# Patient Record
Sex: Female | Born: 1940 | Race: White | Hispanic: No | Marital: Single | Smoking: Never smoker
Health system: Southern US, Community
[De-identification: ages and names within clinical notes are randomized; demographics above are authoritative.]

## PROBLEM LIST (undated history)

## (undated) DIAGNOSIS — F419 Anxiety disorder, unspecified: Secondary | ICD-10-CM

## (undated) DIAGNOSIS — R600 Localized edema: Secondary | ICD-10-CM

## (undated) DIAGNOSIS — M1990 Unspecified osteoarthritis, unspecified site: Secondary | ICD-10-CM

## (undated) DIAGNOSIS — M2550 Pain in unspecified joint: Secondary | ICD-10-CM

## (undated) DIAGNOSIS — Z8709 Personal history of other diseases of the respiratory system: Secondary | ICD-10-CM

## (undated) DIAGNOSIS — R197 Diarrhea, unspecified: Secondary | ICD-10-CM

## (undated) DIAGNOSIS — R609 Edema, unspecified: Secondary | ICD-10-CM

## (undated) DIAGNOSIS — Z8619 Personal history of other infectious and parasitic diseases: Secondary | ICD-10-CM

## (undated) DIAGNOSIS — Z860100 Personal history of colon polyps, unspecified: Secondary | ICD-10-CM

## (undated) DIAGNOSIS — K5790 Diverticulosis of intestine, part unspecified, without perforation or abscess without bleeding: Secondary | ICD-10-CM

## (undated) DIAGNOSIS — Z9289 Personal history of other medical treatment: Secondary | ICD-10-CM

## (undated) DIAGNOSIS — M549 Dorsalgia, unspecified: Secondary | ICD-10-CM

## (undated) DIAGNOSIS — K648 Other hemorrhoids: Secondary | ICD-10-CM

## (undated) DIAGNOSIS — M2540 Effusion, unspecified joint: Secondary | ICD-10-CM

## (undated) DIAGNOSIS — I10 Essential (primary) hypertension: Secondary | ICD-10-CM

## (undated) DIAGNOSIS — D649 Anemia, unspecified: Secondary | ICD-10-CM

## (undated) DIAGNOSIS — R0602 Shortness of breath: Secondary | ICD-10-CM

## (undated) DIAGNOSIS — K219 Gastro-esophageal reflux disease without esophagitis: Secondary | ICD-10-CM

## (undated) DIAGNOSIS — R200 Anesthesia of skin: Secondary | ICD-10-CM

## (undated) DIAGNOSIS — R51 Headache: Secondary | ICD-10-CM

## (undated) DIAGNOSIS — Z9889 Other specified postprocedural states: Secondary | ICD-10-CM

## (undated) DIAGNOSIS — R3915 Urgency of urination: Secondary | ICD-10-CM

## (undated) DIAGNOSIS — R350 Frequency of micturition: Secondary | ICD-10-CM

## (undated) DIAGNOSIS — K449 Diaphragmatic hernia without obstruction or gangrene: Secondary | ICD-10-CM

## (undated) DIAGNOSIS — I872 Venous insufficiency (chronic) (peripheral): Secondary | ICD-10-CM

## (undated) DIAGNOSIS — H269 Unspecified cataract: Secondary | ICD-10-CM

## (undated) DIAGNOSIS — G4730 Sleep apnea, unspecified: Secondary | ICD-10-CM

## (undated) DIAGNOSIS — L539 Erythematous condition, unspecified: Secondary | ICD-10-CM

## (undated) DIAGNOSIS — R112 Nausea with vomiting, unspecified: Secondary | ICD-10-CM

## (undated) DIAGNOSIS — R911 Solitary pulmonary nodule: Secondary | ICD-10-CM

## (undated) DIAGNOSIS — R351 Nocturia: Secondary | ICD-10-CM

## (undated) HISTORY — DX: Diverticulosis of intestine, part unspecified, without perforation or abscess without bleeding: K57.90

## (undated) HISTORY — PX: ESOPHAGOGASTRODUODENOSCOPY: SHX1529

## (undated) HISTORY — DX: Venous insufficiency (chronic) (peripheral): I87.2

## (undated) HISTORY — PX: COLONOSCOPY: SHX174

## (undated) HISTORY — DX: Other hemorrhoids: K64.8

## (undated) HISTORY — DX: Anxiety disorder, unspecified: F41.9

## (undated) HISTORY — DX: Anemia, unspecified: D64.9

## (undated) HISTORY — DX: Solitary pulmonary nodule: R91.1

## (undated) HISTORY — DX: Unspecified osteoarthritis, unspecified site: M19.90

## (undated) HISTORY — PX: BREAST SURGERY: SHX581

## (undated) HISTORY — DX: Diaphragmatic hernia without obstruction or gangrene: K44.9

## (undated) SURGERY — Surgical Case
Anesthesia: *Unknown

---

## 1972-09-26 HISTORY — PX: TUBAL LIGATION: SHX77

## 1981-09-26 HISTORY — PX: ABDOMINAL HYSTERECTOMY: SHX81

## 1983-09-27 HISTORY — PX: OTHER SURGICAL HISTORY: SHX169

## 1988-09-26 HISTORY — PX: CHOLECYSTECTOMY: SHX55

## 1991-09-27 HISTORY — PX: ANKLE SURGERY: SHX546

## 2001-11-27 DIAGNOSIS — I739 Peripheral vascular disease, unspecified: Secondary | ICD-10-CM | POA: Insufficient documentation

## 2002-01-21 DIAGNOSIS — M1990 Unspecified osteoarthritis, unspecified site: Secondary | ICD-10-CM | POA: Insufficient documentation

## 2002-07-11 DIAGNOSIS — I872 Venous insufficiency (chronic) (peripheral): Secondary | ICD-10-CM | POA: Insufficient documentation

## 2008-10-23 DIAGNOSIS — F419 Anxiety disorder, unspecified: Secondary | ICD-10-CM | POA: Insufficient documentation

## 2009-02-24 DIAGNOSIS — G4730 Sleep apnea, unspecified: Secondary | ICD-10-CM | POA: Insufficient documentation

## 2009-09-26 HISTORY — PX: HAND SURGERY: SHX662

## 2011-06-27 HISTORY — PX: OTHER SURGICAL HISTORY: SHX169

## 2011-09-27 HISTORY — PX: ANTERIOR CRUCIATE LIGAMENT REPAIR: SHX115

## 2012-01-31 NOTE — Consult Note (Addendum)
Anesthesia Phone Consult:  Patient is a 71 year old female scheduled for a left TKA by Dr. Sherlean Foot on 02/13/12.  Her PAT appointment is scheduled for 02/07/12.  I and Anesthesiologist Dr. Noreene Larsson spoke with Ms. Oberhaus over the telephone in April 2013 (per Dr. Tobin Chad request prior to officially scheduling patient's surgery).    Her PCP is Dr. Margarito Liner. Criss Alvine, III at [facility] in [place].  Her history includes HTN, OSA without use of CPAP, obesity, anxiety, venous insufficiency, OA, hiatal hernia, cholecystectomy, hysterectomy.    Her EKG from 11/28/11 [facility]) showed SR possible septal infarct (age undetermined), nonspecific ST changes (possibly due to artifact in inferior lateral leads).  When I spoke with her she denied CP.  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  Her PCP has recently added Norvasc and changed her from HCTZ to furosemide.  These were in addition to her metoprolol and losartan.  She faxed me a list of several BP readings done in March and April 2013 that ranged from 147-178/70-78.  Dr. Noreene Larsson recommended she have a stress test preoperatively which was arranged by her PCP.  On 01/11/12 she had a nuclear stress test at [facility] in [place], [place] that showed:  Negative Lexiscan stress Cardiolite study with no evidence of inducible ischemia or myocaridal scar and normal LV size and systolic function, EF 71%.   She will need a CXR and labs at her PAT appointment.  I can follow-up with her then as well.  Addendum: 02/07/12 1630  I saw patient today during her PAT visit.  She has no new issues.  She has already been seen at Dr. Tobin Chad office for her H&P.  BP 132/80 today.  Labs reviewed.  Urine culture is still pending.  CXR today showed no active disease and degenerative changes of the mid thoracic spine.   Exam shows heart RRR, no murmur, lungs clear, No carotid bruits noted.  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 She has not been able to tolerate Lasix on a daily basis due to her work schedule, but typically takes HCTZ or Lasix on a daily basis.     Questions answered.  She will talk further with her assigned Anesthesiologist on the day of surgery.  Plan to proceed.   Addendum: 02/09/12 1645  Urine culture results called to Altamese Cabal, PA-C.  He has requested that a foley catheter be placed in Short Stay on arrival for surgery and a UA, C&S be resent at that time.  Shonna Chock, PA-C

## 2012-02-01 ENCOUNTER — Encounter (HOSPITAL_COMMUNITY): Payer: Self-pay | Admitting: Pharmacy Technician

## 2012-02-02 ENCOUNTER — Other Ambulatory Visit: Payer: Self-pay | Admitting: Orthopedic Surgery

## 2012-02-07 ENCOUNTER — Encounter (HOSPITAL_COMMUNITY): Payer: Self-pay

## 2012-02-07 ENCOUNTER — Encounter (HOSPITAL_COMMUNITY)
Admission: RE | Admit: 2012-02-07 | Discharge: 2012-02-07 | Disposition: A | Discharge status: Home / Self Care | Payer: BC Managed Care – PPO | Source: Ambulatory Visit | Attending: Orthopedic Surgery | Admitting: Orthopedic Surgery

## 2012-02-07 HISTORY — DX: Shortness of breath: R06.02

## 2012-02-07 HISTORY — DX: Other specified postprocedural states: R11.2

## 2012-02-07 HISTORY — DX: Sleep apnea, unspecified: G47.30

## 2012-02-07 HISTORY — DX: Gastro-esophageal reflux disease without esophagitis: K21.9

## 2012-02-07 HISTORY — DX: Essential (primary) hypertension: I10

## 2012-02-07 HISTORY — DX: Unspecified osteoarthritis, unspecified site: M19.90

## 2012-02-07 HISTORY — DX: Other specified postprocedural states: Z98.890

## 2012-02-07 LAB — COMPREHENSIVE METABOLIC PANEL
ALT: 15 U/L (ref 0–35)
AST: 13 U/L (ref 0–37)
Albumin: 3.8 g/dL (ref 3.5–5.2)
Alkaline Phosphatase: 69 U/L (ref 39–117)
BUN: 16 mg/dL (ref 6–23)
CO2: 30 mEq/L (ref 19–32)
Calcium: 10 mg/dL (ref 8.4–10.5)
Chloride: 100 mEq/L (ref 96–112)
Creatinine, Ser: 0.71 mg/dL (ref 0.50–1.10)
GFR calc Af Amer: >90 mL/min (ref >90)
GFR calc non Af Amer: 85 mL/min — ABNORMAL LOW (ref >90)
Glucose, Bld: 90 mg/dL (ref 70–99)
Potassium: 3.8 mEq/L (ref 3.5–5.1)
Sodium: 139 mEq/L (ref 135–145)
Total Bilirubin: 0.8 mg/dL (ref 0.3–1.2)
Total Protein: 7.2 g/dL (ref 6.0–8.3)

## 2012-02-07 LAB — DIFFERENTIAL
Basophils Absolute: 0 10*3/uL (ref 0.0–0.1)
Basophils Relative: 0 % (ref 0–1)
Eosinophils Absolute: 0.2 10*3/uL (ref 0.0–0.7)
Eosinophils Relative: 2 % (ref 0–5)
Lymphocytes Relative: 31 % (ref 12–46)
Lymphs Abs: 2.9 10*3/uL (ref 0.7–4.0)
Monocytes Absolute: 0.7 10*3/uL (ref 0.1–1.0)
Monocytes Relative: 7 % (ref 3–12)
Neutro Abs: 5.7 10*3/uL (ref 1.7–7.7)
Neutrophils Relative %: 60 % (ref 43–77)

## 2012-02-07 LAB — CBC
HCT: 39.6 % (ref 36.0–46.0)
Hemoglobin: 12.8 g/dL (ref 12.0–15.0)
MCH: 29.2 pg (ref 26.0–34.0)
MCHC: 32.3 g/dL (ref 30.0–36.0)
MCV: 90.4 fL (ref 78.0–100.0)
Platelets: 252 10*3/uL (ref 150–400)
RBC: 4.38 MIL/uL (ref 3.87–5.11)
RDW: 13.7 % (ref 11.5–15.5)
WBC: 9.5 10*3/uL (ref 4.0–10.5)

## 2012-02-07 LAB — TYPE AND SCREEN
ABO/RH(D): B POS
Antibody Screen: NEGATIVE

## 2012-02-07 LAB — URINALYSIS, ROUTINE W REFLEX MICROSCOPIC
Bilirubin Urine: NEGATIVE
Glucose, UA: NEGATIVE mg/dL
Hgb urine dipstick: NEGATIVE
Ketones, ur: NEGATIVE mg/dL
Nitrite: NEGATIVE
Protein, ur: NEGATIVE mg/dL
Specific Gravity, Urine: 1.012 (ref 1.005–1.030)
Urobilinogen, UA: 0.2 mg/dL (ref 0.0–1.0)
pH: 6.5 (ref 5.0–8.0)

## 2012-02-07 LAB — PROTIME-INR
INR: 0.92 (ref 0.00–1.49)
Prothrombin Time: 12.6 seconds (ref 11.6–15.2)

## 2012-02-07 LAB — SURGICAL PCR SCREEN
MRSA, PCR: NEGATIVE
Staphylococcus aureus: NEGATIVE

## 2012-02-07 LAB — URINE MICROSCOPIC-ADD ON

## 2012-02-07 LAB — ABO/RH: ABO/RH(D): B POS

## 2012-02-07 LAB — APTT: aPTT: 30 seconds (ref 24–37)

## 2012-02-07 NOTE — Progress Notes (Signed)
[name]  [place] , [place]  Tedra Coupe )

## 2012-02-07 NOTE — Pre-Procedure Instructions (Signed)
20 Kenadi Miltner  02/07/2012   Your procedure is scheduled on:  Monday Feb 13, 2012.  Report to [facility] at 0800 AM.  Call this number if you have problems the morning of surgery: [phone]   Remember:   Do not eat food:After Midnight.  May have clear liquids: up to 4 Hours before arrival until 0400 am.  Clear liquids include soda, tea, black coffee, apple or grape juice, broth.  Take these medicines the morning of surgery with A SIP OF WATER: Amlodipine (Norvasc), Metoprolol (Lopressor), and Pantoprazole (Protonix).   Do not wear jewelry, make-up or nail polish.  Do not wear lotions, powders, or perfumes. You may wear deodorant.  Do not shave 48 hours prior to surgery. Men may shave face and neck.  Do not bring valuables to the hospital.  Contacts, dentures or bridgework may not be worn into surgery.  Leave suitcase in the car. After surgery it may be brought to your room.  For patients admitted to the hospital, checkout time is 11:00 AM the day of discharge.   Patients discharged the day of surgery will not be allowed to drive home.  Name and phone number of your driver:   Special Instructions: CHG Shower Use Special Wash: 1/2 bottle night before surgery and 1/2 bottle morning of surgery.   Please read over the following fact sheets that you were given: Pain Booklet, Coughing and Deep Breathing, Blood Transfusion Information, Total Joint Packet, MRSA Information and Surgical Site Infection Prevention

## 2012-02-09 LAB — URINE CULTURE
Colony Count: >=100000
Culture  Setup Time: 201305150242

## 2012-02-12 MED ORDER — CEFAZOLIN SODIUM-DEXTROSE 2-3 GM-% IV SOLR
2.0000 g | INTRAVENOUS | Status: AC
  Administered 2012-02-13: 2 g via INTRAVENOUS
  Filled 2012-02-12: qty 50

## 2012-02-13 ENCOUNTER — Encounter (HOSPITAL_COMMUNITY): Payer: Self-pay | Admitting: Vascular Surgery

## 2012-02-13 ENCOUNTER — Inpatient Hospital Stay (HOSPITAL_COMMUNITY)
Admission: RE | Admit: 2012-02-13 | Discharge: 2012-02-16 | DRG: 209 | Disposition: A | Discharge status: Home Health | Payer: BC Managed Care – PPO | Source: Ambulatory Visit | Attending: Orthopedic Surgery | Admitting: Orthopedic Surgery

## 2012-02-13 ENCOUNTER — Ambulatory Visit (HOSPITAL_COMMUNITY): Payer: BC Managed Care – PPO | Admitting: Vascular Surgery

## 2012-02-13 ENCOUNTER — Encounter (HOSPITAL_COMMUNITY)
Admission: RE | Disposition: A | Discharge status: Home Health | Payer: Self-pay | Source: Ambulatory Visit | Attending: Orthopedic Surgery

## 2012-02-13 ENCOUNTER — Encounter (HOSPITAL_COMMUNITY): Payer: Self-pay | Admitting: *Deleted

## 2012-02-13 DIAGNOSIS — Z96659 Presence of unspecified artificial knee joint: Secondary | ICD-10-CM

## 2012-02-13 DIAGNOSIS — M129 Arthropathy, unspecified: Secondary | ICD-10-CM | POA: Diagnosis present

## 2012-02-13 DIAGNOSIS — Z79899 Other long term (current) drug therapy: Secondary | ICD-10-CM

## 2012-02-13 DIAGNOSIS — M1712 Unilateral primary osteoarthritis, left knee: Secondary | ICD-10-CM

## 2012-02-13 DIAGNOSIS — D62 Acute posthemorrhagic anemia: Secondary | ICD-10-CM | POA: Diagnosis not present

## 2012-02-13 DIAGNOSIS — I10 Essential (primary) hypertension: Secondary | ICD-10-CM | POA: Diagnosis present

## 2012-02-13 DIAGNOSIS — K219 Gastro-esophageal reflux disease without esophagitis: Secondary | ICD-10-CM | POA: Diagnosis present

## 2012-02-13 DIAGNOSIS — M1710 Unilateral primary osteoarthritis, unspecified knee: Principal | ICD-10-CM | POA: Diagnosis present

## 2012-02-13 DIAGNOSIS — G4730 Sleep apnea, unspecified: Secondary | ICD-10-CM | POA: Diagnosis present

## 2012-02-13 HISTORY — PX: TOTAL KNEE ARTHROPLASTY: SHX125

## 2012-02-13 LAB — URINALYSIS, ROUTINE W REFLEX MICROSCOPIC
Bilirubin Urine: NEGATIVE
Glucose, UA: NEGATIVE mg/dL
Hgb urine dipstick: NEGATIVE
Ketones, ur: NEGATIVE mg/dL
Leukocytes, UA: NEGATIVE
Nitrite: NEGATIVE
Protein, ur: NEGATIVE mg/dL
Specific Gravity, Urine: 1.024 (ref 1.005–1.030)
Urobilinogen, UA: 0.2 mg/dL (ref 0.0–1.0)
pH: 5.5 (ref 5.0–8.0)

## 2012-02-13 SURGERY — ARTHROPLASTY, KNEE, TOTAL
Anesthesia: Regional | Site: Knee | Laterality: Left | Wound class: Clean

## 2012-02-13 MED ORDER — ALUM & MAG HYDROXIDE-SIMETH 200-200-20 MG/5ML PO SUSP: 30.0000 mL | ORAL | Status: DC | PRN

## 2012-02-13 MED ORDER — PROPOFOL 10 MG/ML IV EMUL
INTRAVENOUS | Status: DC | PRN
  Administered 2012-02-13: 200 mg via INTRAVENOUS

## 2012-02-13 MED ORDER — MENTHOL 3 MG MT LOZG: 1.0000 | LOZENGE | OROMUCOSAL | Status: DC | PRN

## 2012-02-13 MED ORDER — LACTATED RINGERS IV SOLN
INTRAVENOUS | Status: DC | PRN
  Administered 2012-02-13 (×3): via INTRAVENOUS

## 2012-02-13 MED ORDER — MIDAZOLAM HCL 2 MG/2ML IJ SOLN: 0.5000 mg | Freq: Once | INTRAMUSCULAR | Status: DC | PRN

## 2012-02-13 MED ORDER — LIDOCAINE HCL (CARDIAC) 20 MG/ML IV SOLN
INTRAVENOUS | Status: DC | PRN
  Administered 2012-02-13: 50 mg via INTRAVENOUS

## 2012-02-13 MED ORDER — ACETAMINOPHEN 10 MG/ML IV SOLN
1000.0000 mg | Freq: Four times a day (QID) | INTRAVENOUS | Status: DC
  Administered 2012-02-13: 1000 mg via INTRAVENOUS

## 2012-02-13 MED ORDER — HYDROMORPHONE HCL PF 1 MG/ML IJ SOLN
0.5000 mg | INTRAMUSCULAR | Status: DC | PRN
  Administered 2012-02-13: 1 mg via INTRAVENOUS
  Filled 2012-02-13: qty 1

## 2012-02-13 MED ORDER — SENNA 8.6 MG PO TABS
1.0000 | ORAL_TABLET | Freq: Two times a day (BID) | ORAL | Status: DC
  Administered 2012-02-14 – 2012-02-16 (×5): 8.6 mg via ORAL
  Filled 2012-02-13 (×7): qty 1

## 2012-02-13 MED ORDER — DIAZEPAM 5 MG/ML IJ SOLN
5.0000 mg | Freq: Once | INTRAMUSCULAR | Status: AC
  Administered 2012-02-13: 5 mg via INTRAVENOUS

## 2012-02-13 MED ORDER — PANTOPRAZOLE SODIUM 40 MG PO TBEC
40.0000 mg | DELAYED_RELEASE_TABLET | Freq: Every day | ORAL | Status: DC
  Administered 2012-02-14 – 2012-02-16 (×3): 40 mg via ORAL
  Filled 2012-02-13 (×4): qty 1

## 2012-02-13 MED ORDER — LACTATED RINGERS IV SOLN
INTRAVENOUS | Status: DC
  Administered 2012-02-13: 10:00:00 via INTRAVENOUS

## 2012-02-13 MED ORDER — HYDROCHLOROTHIAZIDE 25 MG PO TABS
25.0000 mg | ORAL_TABLET | Freq: Every day | ORAL | Status: DC
  Administered 2012-02-13 – 2012-02-16 (×4): 25 mg via ORAL
  Filled 2012-02-13 (×4): qty 1

## 2012-02-13 MED ORDER — BUPIVACAINE 0.25 % ON-Q PUMP SINGLE CATH 300ML
300.0000 mL | INJECTION | Status: DC
  Filled 2012-02-13: qty 300

## 2012-02-13 MED ORDER — METHOCARBAMOL 100 MG/ML IJ SOLN
500.0000 mg | INTRAVENOUS | Status: AC
  Administered 2012-02-13: 500 mg via INTRAVENOUS
  Filled 2012-02-13: qty 5

## 2012-02-13 MED ORDER — SODIUM CHLORIDE 0.9 % IV SOLN
INTRAVENOUS | Status: DC
  Administered 2012-02-15: 04:00:00 via INTRAVENOUS

## 2012-02-13 MED ORDER — SODIUM CHLORIDE 0.9 % IR SOLN
Status: DC | PRN
  Administered 2012-02-13: 3000 mL

## 2012-02-13 MED ORDER — BISACODYL 5 MG PO TBEC: 5.0000 mg | DELAYED_RELEASE_TABLET | Freq: Every day | ORAL | Status: DC | PRN

## 2012-02-13 MED ORDER — TRIAMCINOLONE ACETONIDE 0.1 % EX CREA
1.0000 "application " | TOPICAL_CREAM | Freq: Two times a day (BID) | CUTANEOUS | Status: DC
  Administered 2012-02-14 – 2012-02-15 (×2): 1 via TOPICAL
  Filled 2012-02-13: qty 15

## 2012-02-13 MED ORDER — LOSARTAN POTASSIUM 50 MG PO TABS
100.0000 mg | ORAL_TABLET | Freq: Every day | ORAL | Status: DC
  Administered 2012-02-13 – 2012-02-16 (×4): 100 mg via ORAL
  Filled 2012-02-13 (×4): qty 2

## 2012-02-13 MED ORDER — FUROSEMIDE 40 MG PO TABS
40.0000 mg | ORAL_TABLET | Freq: Every day | ORAL | Status: DC | PRN
  Filled 2012-02-13: qty 1

## 2012-02-13 MED ORDER — OXYCODONE HCL 5 MG PO TABS
5.0000 mg | ORAL_TABLET | ORAL | Status: DC | PRN
  Administered 2012-02-13 – 2012-02-16 (×8): 10 mg via ORAL
  Filled 2012-02-13 (×8): qty 2

## 2012-02-13 MED ORDER — FENTANYL CITRATE 0.05 MG/ML IJ SOLN
INTRAMUSCULAR | Status: AC
  Filled 2012-02-13: qty 2

## 2012-02-13 MED ORDER — AMLODIPINE BESYLATE 5 MG PO TABS
5.0000 mg | ORAL_TABLET | Freq: Every day | ORAL | Status: DC
  Administered 2012-02-14 – 2012-02-16 (×3): 5 mg via ORAL
  Filled 2012-02-13 (×3): qty 1

## 2012-02-13 MED ORDER — ONDANSETRON HCL 4 MG/2ML IJ SOLN: 4.0000 mg | Freq: Four times a day (QID) | INTRAMUSCULAR | Status: DC | PRN

## 2012-02-13 MED ORDER — METHOCARBAMOL 100 MG/ML IJ SOLN
500.0000 mg | Freq: Four times a day (QID) | INTRAVENOUS | Status: DC | PRN
  Filled 2012-02-13: qty 5

## 2012-02-13 MED ORDER — CEFAZOLIN SODIUM-DEXTROSE 2-3 GM-% IV SOLR
2.0000 g | Freq: Four times a day (QID) | INTRAVENOUS | Status: AC
  Administered 2012-02-13 – 2012-02-15 (×8): 2 g via INTRAVENOUS
  Filled 2012-02-13 (×9): qty 50

## 2012-02-13 MED ORDER — METOCLOPRAMIDE HCL 10 MG PO TABS: 5.0000 mg | ORAL_TABLET | Freq: Three times a day (TID) | ORAL | Status: DC | PRN

## 2012-02-13 MED ORDER — SCOPOLAMINE 1 MG/3DAYS TD PT72
MEDICATED_PATCH | TRANSDERMAL | Status: DC | PRN
  Administered 2012-02-13: 1 via TRANSDERMAL

## 2012-02-13 MED ORDER — FLEET ENEMA 7-19 GM/118ML RE ENEM: 1.0000 | ENEMA | Freq: Once | RECTAL | Status: AC | PRN

## 2012-02-13 MED ORDER — HYDROMORPHONE HCL PF 1 MG/ML IJ SOLN
INTRAMUSCULAR | Status: AC
  Filled 2012-02-13: qty 1

## 2012-02-13 MED ORDER — CHLORHEXIDINE GLUCONATE 4 % EX LIQD: 60.0000 mL | Freq: Once | CUTANEOUS | Status: DC

## 2012-02-13 MED ORDER — SODIUM CHLORIDE 0.9 % IV SOLN: INTRAVENOUS | Status: DC

## 2012-02-13 MED ORDER — ENOXAPARIN SODIUM 30 MG/0.3ML ~~LOC~~ SOLN
30.0000 mg | Freq: Two times a day (BID) | SUBCUTANEOUS | Status: DC
  Administered 2012-02-14 – 2012-02-15 (×4): 30 mg via SUBCUTANEOUS
  Filled 2012-02-13 (×7): qty 0.3

## 2012-02-13 MED ORDER — DIAZEPAM 5 MG/ML IJ SOLN
INTRAMUSCULAR | Status: AC
  Filled 2012-02-13: qty 2

## 2012-02-13 MED ORDER — PROMETHAZINE HCL 25 MG/ML IJ SOLN: 6.2500 mg | INTRAMUSCULAR | Status: DC | PRN

## 2012-02-13 MED ORDER — ONDANSETRON HCL 4 MG/2ML IJ SOLN
INTRAMUSCULAR | Status: DC | PRN
  Administered 2012-02-13: 4 mg via INTRAVENOUS

## 2012-02-13 MED ORDER — METHOCARBAMOL 500 MG PO TABS
500.0000 mg | ORAL_TABLET | Freq: Four times a day (QID) | ORAL | Status: DC | PRN
  Administered 2012-02-14 – 2012-02-16 (×4): 500 mg via ORAL
  Filled 2012-02-13 (×4): qty 1

## 2012-02-13 MED ORDER — ACETAMINOPHEN 325 MG PO TABS: 650.0000 mg | ORAL_TABLET | Freq: Four times a day (QID) | ORAL | Status: DC | PRN

## 2012-02-13 MED ORDER — BUPIVACAINE-EPINEPHRINE 0.25% -1:200000 IJ SOLN
INTRAMUSCULAR | Status: DC | PRN
  Administered 2012-02-13: 20 mL

## 2012-02-13 MED ORDER — TRIAMCINOLONE ACETONIDE 0.1 % EX LOTN
1.0000 "application " | TOPICAL_LOTION | Freq: Three times a day (TID) | CUTANEOUS | Status: DC
  Filled 2012-02-13: qty 60

## 2012-02-13 MED ORDER — FENTANYL CITRATE 0.05 MG/ML IJ SOLN
INTRAMUSCULAR | Status: DC | PRN
  Administered 2012-02-13 (×10): 50 ug via INTRAVENOUS
  Administered 2012-02-13: 150 ug via INTRAVENOUS

## 2012-02-13 MED ORDER — OXYCODONE HCL 10 MG PO TB12
10.0000 mg | ORAL_TABLET | Freq: Two times a day (BID) | ORAL | Status: DC
  Administered 2012-02-13 – 2012-02-16 (×6): 10 mg via ORAL
  Filled 2012-02-13 (×6): qty 1

## 2012-02-13 MED ORDER — MIDAZOLAM HCL 5 MG/5ML IJ SOLN
INTRAMUSCULAR | Status: DC | PRN
  Administered 2012-02-13: 2 mg via INTRAVENOUS
  Administered 2012-02-13 (×3): 1 mg via INTRAVENOUS

## 2012-02-13 MED ORDER — ACETAMINOPHEN 10 MG/ML IV SOLN
INTRAVENOUS | Status: AC
  Filled 2012-02-13: qty 100

## 2012-02-13 MED ORDER — DEXAMETHASONE SODIUM PHOSPHATE 4 MG/ML IJ SOLN
INTRAMUSCULAR | Status: DC | PRN
  Administered 2012-02-13: 8 mg via INTRAVENOUS

## 2012-02-13 MED ORDER — MEPERIDINE HCL 25 MG/ML IJ SOLN: 6.2500 mg | INTRAMUSCULAR | Status: DC | PRN

## 2012-02-13 MED ORDER — ZOLPIDEM TARTRATE 5 MG PO TABS: 5.0000 mg | ORAL_TABLET | Freq: Every evening | ORAL | Status: DC | PRN

## 2012-02-13 MED ORDER — ONDANSETRON HCL 4 MG PO TABS: 4.0000 mg | ORAL_TABLET | Freq: Four times a day (QID) | ORAL | Status: DC | PRN

## 2012-02-13 MED ORDER — MIDAZOLAM HCL 2 MG/2ML IJ SOLN
INTRAMUSCULAR | Status: AC
  Filled 2012-02-13: qty 2

## 2012-02-13 MED ORDER — HYDROMORPHONE HCL PF 1 MG/ML IJ SOLN
0.2500 mg | INTRAMUSCULAR | Status: DC | PRN
  Administered 2012-02-13 (×4): 0.5 mg via INTRAVENOUS

## 2012-02-13 MED ORDER — BUPIVACAINE 0.25 % ON-Q PUMP SINGLE CATH 300ML
INJECTION | Status: DC | PRN
  Administered 2012-02-13: 300 mL

## 2012-02-13 MED ORDER — BUPIVACAINE-EPINEPHRINE PF 0.5-1:200000 % IJ SOLN
INTRAMUSCULAR | Status: DC | PRN
  Administered 2012-02-13: 30 mL

## 2012-02-13 MED ORDER — ACETAMINOPHEN 650 MG RE SUPP: 650.0000 mg | Freq: Four times a day (QID) | RECTAL | Status: DC | PRN

## 2012-02-13 MED ORDER — HYDROMORPHONE HCL PF 1 MG/ML IJ SOLN: 0.5000 mg | Freq: Four times a day (QID) | INTRAMUSCULAR | Status: DC | PRN

## 2012-02-13 MED ORDER — DIPHENHYDRAMINE HCL 12.5 MG/5ML PO ELIX: 12.5000 mg | ORAL_SOLUTION | ORAL | Status: DC | PRN

## 2012-02-13 MED ORDER — METOPROLOL TARTRATE 12.5 MG HALF TABLET
12.5000 mg | ORAL_TABLET | Freq: Two times a day (BID) | ORAL | Status: DC
  Administered 2012-02-13 – 2012-02-16 (×6): 12.5 mg via ORAL
  Filled 2012-02-13 (×7): qty 1

## 2012-02-13 MED ORDER — BUPIVACAINE ON-Q PAIN PUMP (FOR ORDER SET NO CHG)
INJECTION | Status: DC
  Filled 2012-02-13: qty 1

## 2012-02-13 MED ORDER — MELOXICAM 15 MG PO TABS
15.0000 mg | ORAL_TABLET | Freq: Every day | ORAL | Status: DC
  Administered 2012-02-13 – 2012-02-16 (×4): 15 mg via ORAL
  Filled 2012-02-13 (×4): qty 1

## 2012-02-13 MED ORDER — SENNOSIDES-DOCUSATE SODIUM 8.6-50 MG PO TABS
1.0000 | ORAL_TABLET | Freq: Every evening | ORAL | Status: DC | PRN
Start: 2012-02-13 — End: 2012-02-16

## 2012-02-13 MED ORDER — PHENOL 1.4 % MT LIQD: 1.0000 | OROMUCOSAL | Status: DC | PRN

## 2012-02-13 MED ORDER — METOCLOPRAMIDE HCL 5 MG/ML IJ SOLN: 5.0000 mg | Freq: Three times a day (TID) | INTRAMUSCULAR | Status: DC | PRN

## 2012-02-13 SURGICAL SUPPLY — 56 items
BANDAGE ESMARK 6X9 LF (GAUZE/BANDAGES/DRESSINGS) ×1 IMPLANT
BLADE SAGITTAL 13X1.27X60 (BLADE) ×2 IMPLANT
BLADE SAW SGTL 83.5X18.5 (BLADE) ×2 IMPLANT
BNDG ESMARK 6X9 LF (GAUZE/BANDAGES/DRESSINGS) ×2
BOWL SMART MIX CTS (DISPOSABLE) ×2 IMPLANT
CATH KIT ON Q 5IN SLV (PAIN MANAGEMENT) ×2 IMPLANT
CEMENT BONE SIMPLEX SPEEDSET (Cement) ×4 IMPLANT
CLOTH BEACON ORANGE TIMEOUT ST (SAFETY) ×2 IMPLANT
COVER BACK TABLE 24X17X13 BIG (DRAPES) IMPLANT
COVER SURGICAL LIGHT HANDLE (MISCELLANEOUS) ×2 IMPLANT
CUFF TOURNIQUET SINGLE 34IN LL (TOURNIQUET CUFF) ×2 IMPLANT
DRAPE EXTREMITY T 121X128X90 (DRAPE) ×2 IMPLANT
DRAPE INCISE IOBAN 66X45 STRL (DRAPES) ×4 IMPLANT
DRAPE PROXIMA HALF (DRAPES) ×2 IMPLANT
DRAPE U-SHAPE 47X51 STRL (DRAPES) ×2 IMPLANT
DRSG ADAPTIC 3X8 NADH LF (GAUZE/BANDAGES/DRESSINGS) ×2 IMPLANT
DRSG PAD ABDOMINAL 8X10 ST (GAUZE/BANDAGES/DRESSINGS) ×2 IMPLANT
DURAPREP 26ML APPLICATOR (WOUND CARE) ×4 IMPLANT
ELECT REM PT RETURN 9FT ADLT (ELECTROSURGICAL) ×2
ELECTRODE REM PT RTRN 9FT ADLT (ELECTROSURGICAL) ×1 IMPLANT
EVACUATOR 1/8 PVC DRAIN (DRAIN) ×2 IMPLANT
GLOVE BIOGEL M 7.0 STRL (GLOVE) IMPLANT
GLOVE BIOGEL PI IND STRL 7.5 (GLOVE) IMPLANT
GLOVE BIOGEL PI IND STRL 8.5 (GLOVE) ×2 IMPLANT
GLOVE BIOGEL PI INDICATOR 7.5 (GLOVE)
GLOVE BIOGEL PI INDICATOR 8.5 (GLOVE) ×2
GLOVE SURG ORTHO 8.0 STRL STRW (GLOVE) ×4 IMPLANT
GOWN PREVENTION PLUS XLARGE (GOWN DISPOSABLE) ×4 IMPLANT
GOWN STRL NON-REIN LRG LVL3 (GOWN DISPOSABLE) ×4 IMPLANT
HANDPIECE INTERPULSE COAX TIP (DISPOSABLE) ×1
HOOD PEEL AWAY FACE SHEILD DIS (HOOD) ×8 IMPLANT
KIT BASIN OR (CUSTOM PROCEDURE TRAY) ×2 IMPLANT
KIT ROOM TURNOVER OR (KITS) ×2 IMPLANT
MANIFOLD NEPTUNE II (INSTRUMENTS) ×2 IMPLANT
NEEDLE 22X1 1/2 (OR ONLY) (NEEDLE) IMPLANT
NS IRRIG 1000ML POUR BTL (IV SOLUTION) ×2 IMPLANT
PACK TOTAL JOINT (CUSTOM PROCEDURE TRAY) ×2 IMPLANT
PAD ARMBOARD 7.5X6 YLW CONV (MISCELLANEOUS) ×4 IMPLANT
PADDING CAST COTTON 6X4 STRL (CAST SUPPLIES) ×2 IMPLANT
POSITIONER HEAD PRONE TRACH (MISCELLANEOUS) ×2 IMPLANT
SET HNDPC FAN SPRY TIP SCT (DISPOSABLE) ×1 IMPLANT
SPONGE GAUZE 4X4 12PLY (GAUZE/BANDAGES/DRESSINGS) ×2 IMPLANT
STAPLER VISISTAT 35W (STAPLE) ×2 IMPLANT
SUCTION FRAZIER TIP 10 FR DISP (SUCTIONS) ×2 IMPLANT
SUT BONE WAX W31G (SUTURE) ×2 IMPLANT
SUT VIC AB 0 CTB1 27 (SUTURE) ×4 IMPLANT
SUT VIC AB 1 CT1 27 (SUTURE) ×1
SUT VIC AB 1 CT1 27XBRD ANBCTR (SUTURE) ×1 IMPLANT
SUT VIC AB 2-0 CT1 27 (SUTURE) ×2
SUT VIC AB 2-0 CT1 TAPERPNT 27 (SUTURE) ×2 IMPLANT
SUT VLOC 180 0 24IN GS25 (SUTURE) ×2 IMPLANT
SYR CONTROL 10ML LL (SYRINGE) IMPLANT
TOWEL OR 17X24 6PK STRL BLUE (TOWEL DISPOSABLE) ×2 IMPLANT
TOWEL OR 17X26 10 PK STRL BLUE (TOWEL DISPOSABLE) ×2 IMPLANT
TRAY FOLEY CATH 14FR (SET/KITS/TRAYS/PACK) ×2 IMPLANT
WATER STERILE IRR 1000ML POUR (IV SOLUTION) ×6 IMPLANT

## 2012-02-13 NOTE — Transfer of Care (Signed)
Immediate Anesthesia Transfer of Care Note  Patient: [name]  Procedure(s) Performed: Procedure(s) (LRB): TOTAL KNEE ARTHROPLASTY (Left)  Patient Location: PACU  Anesthesia Type: General  Level of Consciousness: awake and alert   Airway & Oxygen Therapy: Patient Spontanous Breathing and Patient connected to face mask oxygen  Post-op Assessment: Report given to PACU RN and Post -op Vital signs reviewed and stable  Post vital signs: Reviewed and stable  Complications: No apparent anesthesia complications

## 2012-02-13 NOTE — Anesthesia Postprocedure Evaluation (Signed)
  Anesthesia Post-op Note  Patient: [name]  Procedure(s) Performed: Procedure(s) (LRB): TOTAL KNEE ARTHROPLASTY (Left)  Patient Location: PACU  Anesthesia Type: GA combined with regional for post-op pain  Level of Consciousness: awake  Airway and Oxygen Therapy: Patient Spontanous Breathing  Post-op Pain: mild  Post-op Assessment: Post-op Vital signs reviewed, Patient's Cardiovascular Status Stable, Respiratory Function Stable, Patent Airway, No signs of Nausea or vomiting and Pain level controlled  Post-op Vital Signs: stable  Complications: No apparent anesthesia complications

## 2012-02-13 NOTE — Anesthesia Procedure Notes (Addendum)
Anesthesia Regional Block:  Femoral nerve block  Pre-Anesthetic Checklist: ,, timeout performed, Correct Patient, Correct Site, Correct Laterality, Correct Procedure, Correct Position, site marked, Risks and benefits discussed, pre-op evaluation,  At surgeon's request and post-op pain management  Laterality: Left  Prep: Maximum Sterile Barrier Precautions used and chloraprep       Needles:  Injection technique: Single-shot  Needle Type: Echogenic Stimulator Needle      Needle Gauge: 22 and 22 G    Additional Needles:  Procedures: ultrasound guided and nerve stimulator Femoral nerve block  Nerve Stimulator or Paresthesia:  Response: Patellar respose, 0.4 mA,   Additional Responses:   Narrative:  Start time: 02/13/2012 10:45 AM End time: 02/13/2012 10:55 AM Injection made incrementally with aspirations every 5 mL. Anesthesiologist: Fitzgerald,MD  Additional Notes: 2% Lidocaine skin wheel.   Femoral nerve block Procedure Name: LMA Insertion Date/Time: 02/13/2012 11:17 AM Performed by: Armandina Gemma MARIE Pre-anesthesia Checklist: Patient identified, Emergency Drugs available, Suction available, Patient being monitored and Timeout performed Patient Re-evaluated:Patient Re-evaluated prior to inductionOxygen Delivery Method: Circle system utilized Preoxygenation: Pre-oxygenation with 100% oxygen Intubation Type: IV induction Ventilation: Mask ventilation without difficulty LMA: LMA inserted and LMA with gastric port inserted LMA Size: 4.0 Tube type: Oral Number of attempts: 1 Tube secured with: Tape Dental Injury: Teeth and Oropharynx as per pre-operative assessment  Comments: atraumatic

## 2012-02-13 NOTE — Preoperative (Signed)
Beta Blockers   Reason not to administer Beta Blockers:Not Applicable 

## 2012-02-13 NOTE — Plan of Care (Signed)
Problem: Consults Goal: Diagnosis- Total Joint Replacement Primary Total Knee     

## 2012-02-13 NOTE — Op Note (Signed)
TOTAL KNEE REPLACEMENT OPERATIVE NOTE:  02/13/2012  3:07 PM  PATIENT:  [name]  71 y.o. female  PRE-OPERATIVE DIAGNOSIS:  osteoarthritis left knee  POST-OPERATIVE DIAGNOSIS:  osteoarthritis left knee  PROCEDURE:  Procedure(s): TOTAL KNEE ARTHROPLASTY  SURGEON:  Surgeon(s): Raymon Mutton, MD  PHYSICIAN ASSISTANT: Altamese Cabal, [facility]  ANESTHESIA:   general  DRAINS: Hemovac and On-Q Marcaine Pain Pump  SPECIMEN: None  COUNTS:  Correct  TOURNIQUET:   Total Tourniquet Time Documented: Thigh (Left) - 58 minutes  DICTATION:  Indication for procedure:    The patient is a 71 y.o. female who has failed conservative treatment for osteoarthritis left knee.  Informed consent was obtained prior to anesthesia. The risks versus benefits of the operation were explain and in a way the patient can, and did, understand.   Description of procedure:     The patient was taken to the operating room and placed under anesthesia.  The patient was positioned in the usual fashion taking care that all body parts were adequately padded and/or protected.  I foley catheter was placed.  A tourniquet was applied and the leg prepped and draped in the usual sterile fashion.  The extremity was exsanguinated with the esmarch and tourniquet inflated to 350 mmHg.  Pre-operative range of motion was normal.  The knee was in 5 degree of significant varus.  A midline incision approximately 6-7 inches long was made with a #10 blade.  A new blade was used to make a parapatellar arthrotomy going 2-3 cm into the quadriceps tendon, over the patella, and alongside the medial aspect of the patellar tendon.  A synovectomy was then performed with the #10 blade and forceps. I then elevated the deep MCL off the medial tibial metaphysis subperiosteally around to the semimembranosus attachment.    I everted the patella and used calipers to measure patellar thickness.  I used the reamer to ream down to appropriate thickness  to recreate the native thickness.  I then removed excess bone with the rongeur and sagittal saw.  I used the appropriately sized template and drilled the three lug holes.  I then put the trial in place and measured the thickness with the calipers to ensure recreation of the native thickness.  The trial was then removed and the patella subluxed and the knee brought into flexion.  A homan retractor was place to retract and protect the patella and lateral structures.  A Z-retractor was place medially to protect the medial structures.  The extra-medullary alignment system was used to make cut the tibial articular surface perpendicular to the anamotic axis of the tibia and in 3 degrees of posterior slope.  The cut surface and alignment jig was removed.  I then used the intramedullary alignment guide to make a 6 valgus cut on the distal femur.  I then marked out the epicondylar axis on the distal femur.  The posterior condylar axis measured 3 degrees.  I then used the anterior referencing sizer and measured the femur to be a size F.  The 4-In-1 cutting block was screwed into place in external rotation matching the posterior condylar angle, making our cuts perpendicular to the epicondylar axis.  Anterior, posterior and chamfer cuts were made with the sagittal saw.  The cutting block and cut pieces were removed.  A lamina spreader was placed in 90 degrees of flexion.  The ACL, PCL, menisci, and posterior condylar osteophytes were removed.  A 10 mm spacer blocked was found to offer good flexion and  extension gap balance after minimal in degree releasing.   The scoop retractor was then placed and the femoral finishing block was pinned in place.  The small sagittal saw was used as well as the lug drill to finish the femur.  The block and cut surfaces were removed and the medullary canal hole filled with autograft bone from the cut pieces.  The tibia was delivered forward in deep flexion and external rotation.  A size 4  tray was selected and pinned into place centered on the medial 1/3 of the tibial tubercle.  The reamer and keel was used to prepare the tibia through the tray.    I then trialed with the size F femur, size 4 tibia, a 10 mm insert and the 32 patella.  I had excellent flexion/extension gap balance, excellent patella tracking.  Flexion was full and beyond 120 degrees; extension was zero.  These components were chosen and the staff opened them to me on the back table while the knee was lavaged copiously and the cement mixed.  I cemented in the components and removed all excess cement.  The polyethylene tibial component was snapped into place and the knee placed in extension while cement was hardening.  The capsule was infilltrated with 20cc of .25% Marcaine with epinephrine.  A hemovac was place in the joint exiting superolaterally.  A pain pump was place superomedially superficial to the arthrotomy.  Once the cement was hard, the tourniquet was let down.  Hemostasis was obtained.  The arthrotomy was closed with figure-8 #1 vicryl sutures.  The deep soft tissues were closed with #0 vicryls and the subcuticular layer closed with a running #2-0 vicryl.  The skin was reapproximated and closed with skin staples.  The wound was dressed with xeroform, 4 x4's, 2 ABD sponges, a single layer of webril and a TED stocking.   The patient was then awakened, extubated, and taken to the recovery room in stable condition.  BLOOD LOSS:  300cc DRAINS: 1 hemovac, 1 pain catheter COMPLICATIONS:  None.  PLAN OF CARE: Admit to inpatient   PATIENT DISPOSITION:  PACU - hemodynamically stable.   Delay start of Pharmacological VTE agent (>24hrs) due to surgical blood loss or risk of bleeding:  not applicable  Please fax a copy of this op note to my office at [phone] (please only include page 1 and 2 of the Case Information op note)

## 2012-02-13 NOTE — Addendum Note (Signed)
Addendum  created 02/13/12 1530 by Bedelia Person, MD   Modules edited:Orders

## 2012-02-13 NOTE — Progress Notes (Signed)
Orthopedic Tech Progress Note Patient Details:  [name] 11-11-1940 [id number]  CPM Left Knee CPM Left Knee: On Left Knee Flexion (Degrees): 90  Left Knee Extension (Degrees): 0    [name] T 02/13/2012, 1:43 PM

## 2012-02-13 NOTE — Anesthesia Preprocedure Evaluation (Signed)
Anesthesia Evaluation  Patient identified by MRN, date of birth, ID band Patient awake    Reviewed: Allergy & Precautions, H&P , NPO status , Patient's Chart, lab work & pertinent test results, reviewed documented beta blocker date and time   History of Anesthesia Complications (+) PONV  Airway Mallampati: II TM Distance: >3 FB Neck ROM: Full    Dental  (+) Teeth Intact and Dental Advisory Given   Pulmonary sleep apnea (declined CPAP) ,  breath sounds clear to auscultation  Pulmonary exam normal       Cardiovascular hypertension, Pt. on medications and Pt. on home beta blockers Rhythm:Regular Rate:Normal  4/13 stress test:  Normal, no ischemia or scar   Neuro/Psych negative neurological ROS     GI/Hepatic Neg liver ROS, GERD-  Medicated and Controlled,  Endo/Other  Morbid obesity  Renal/GU negative Renal ROS     Musculoskeletal  (+) Arthritis -,   Abdominal (+) + obese,   Peds  Hematology negative hematology ROS (+)   Anesthesia Other Findings   Reproductive/Obstetrics                           Anesthesia Physical Anesthesia Plan  ASA: III  Anesthesia Plan: General   Post-op Pain Management:    Induction: Intravenous  Airway Management Planned: LMA  Additional Equipment:   Intra-op Plan:   Post-operative Plan:   Informed Consent: I have reviewed the patients History and Physical, chart, labs and discussed the procedure including the risks, benefits and alternatives for the proposed anesthesia with the patient or authorized representative who has indicated his/her understanding and acceptance.   Dental advisory given  Plan Discussed with: Surgeon and CRNA  Anesthesia Plan Comments: (Plan routine monitors, GA- LMA OK, femoral nerve block for post op analgesia )        Anesthesia Quick Evaluation

## 2012-02-13 NOTE — H&P (Signed)
  Vaeda Westall MRN:  [id number] DOB/SEX:  1941/04/16/female  CHIEF COMPLAINT:  Painful left Knee  HISTORY: Patient is a 71 y.o. female presented with a history of pain in the left knee. Onset of symptoms was gradual starting several years ago with gradually worsening course since that time. The patient noted no past surgery on the left knee. Prior procedures on the knee include arthroscopy. Patient has been treated conservatively with over-the-counter NSAIDs and activity modification. Patient currently rates pain in the knee at several out of 10 with activity. There is pain at night.  PAST MEDICAL HISTORY: There are no active problems to display for this patient.  Past Medical History  Diagnosis Date  . PONV (postoperative nausea and vomiting)   . Hypertension   . Shortness of breath     WITH EXERTION   . Sleep apnea     NO MACHINE   . GERD (gastroesophageal reflux disease)   . Arthritis   . No pertinent past medical history     BIL CHINS RED, SWOLLEN SINCE 1985   Past Surgical History  Procedure Date  . Tubal ligation     1974  . Abdominal hysterectomy     1983  . Breast surgery     LEFT SIDE BIOPSY 1989  . Cholecystectomy     1990  . Ankle surgery     LEFT 1993  . Hand surgery     2011 CYST        MEDICATIONS:   No prescriptions prior to admission    ALLERGIES:  No Known Allergies  REVIEW OF SYSTEMS:  Pertinent items are noted in HPI.   FAMILY HISTORY:  No family history on file.  SOCIAL HISTORY:   History  Substance Use Topics  . Smoking status: Never Smoker   . Smokeless tobacco: Not on file  . Alcohol Use: Yes     RARELY      EXAMINATION:  Vital signs in last 24 hours:    General appearance: alert, cooperative, no distress and morbidly obese Lungs: clear to auscultation bilaterally Heart: regular rate and rhythm, S1, S2 normal, no murmur, click, rub or gallop Abdomen: soft, non-tender; bowel sounds normal; no masses,  no  organomegaly Extremities: extremities normal, atraumatic, no cyanosis or edema and Homans sign is negative, no sign of DVT Pulses: 2+ and symmetric Skin: Skin color, texture, turgor normal. No rashes or lesions Neurologic: Alert and oriented X 3, normal strength and tone. Normal symmetric reflexes. Normal coordination and gait  Musculoskeletal:  ROM 0-115, Ligaments intact,  Imaging Review Plain radiographs demonstrate severe degenerative joint disease of the left knee. The overall alignment is mild valgus. The bone quality appears to be good for age and reported activity level.  Assessment/Plan: End stage arthritis, left knee   The patient history, physical examination and imaging studies are consistent with advanced degenerative joint disease of the left knee. The patient has failed conservative treatment.  The clearance notes were reviewed.  After discussion with the patient it was felt that Total Knee Replacement was indicated. The procedure,  risks, and benefits of total knee arthroplasty were presented and reviewed. The risks including but not limited to aseptic loosening, infection, blood clots, vascular injury, stiffness, patella tracking problems complications among others were discussed. The patient acknowledged the explanation, agreed to proceed with the plan.  Joannie Medine 02/13/2012, 7:18 AM

## 2012-02-14 ENCOUNTER — Encounter (HOSPITAL_COMMUNITY): Payer: Self-pay | Admitting: Orthopedic Surgery

## 2012-02-14 LAB — CBC
HCT: 30.8 % — ABNORMAL LOW (ref 36.0–46.0)
Hemoglobin: 9.7 g/dL — ABNORMAL LOW (ref 12.0–15.0)
MCH: 29 pg (ref 26.0–34.0)
MCHC: 31.5 g/dL (ref 30.0–36.0)
MCV: 92.2 fL (ref 78.0–100.0)
Platelets: 224 10*3/uL (ref 150–400)
RBC: 3.34 MIL/uL — ABNORMAL LOW (ref 3.87–5.11)
RDW: 14.1 % (ref 11.5–15.5)
WBC: 13.7 10*3/uL — ABNORMAL HIGH (ref 4.0–10.5)

## 2012-02-14 LAB — BASIC METABOLIC PANEL
BUN: 15 mg/dL (ref 6–23)
CO2: 30 mEq/L (ref 19–32)
Calcium: 8.7 mg/dL (ref 8.4–10.5)
Chloride: 101 mEq/L (ref 96–112)
Creatinine, Ser: 0.85 mg/dL (ref 0.50–1.10)
GFR calc Af Amer: 78 mL/min — ABNORMAL LOW (ref >90)
GFR calc non Af Amer: 67 mL/min — ABNORMAL LOW (ref >90)
Glucose, Bld: 123 mg/dL — ABNORMAL HIGH (ref 70–99)
Potassium: 4.3 mEq/L (ref 3.5–5.1)
Sodium: 138 mEq/L (ref 135–145)

## 2012-02-14 LAB — URINE CULTURE
Colony Count: NO GROWTH
Culture  Setup Time: 201305201020
Culture: NO GROWTH

## 2012-02-14 NOTE — Progress Notes (Signed)
Physical Therapy Evaluation Note  Past Medical History  Diagnosis Date  . PONV (postoperative nausea and vomiting)   . Hypertension   . Shortness of breath     WITH EXERTION   . Sleep apnea     NO MACHINE   . GERD (gastroesophageal reflux disease)   . Arthritis   . No pertinent past medical history     BIL CHINS RED, SWOLLEN SINCE 1985    Past Surgical History  Procedure Date  . Tubal ligation     1974  . Abdominal hysterectomy     1983  . Breast surgery     LEFT SIDE BIOPSY 1989  . Cholecystectomy     1990  . Ankle surgery     LEFT 1993  . Hand surgery     2011 CYST          02/14/12 0836  PT Visit Information  Last PT Received On 02/14/12  Assistance Needed +2  PT Time Calculation  PT Start Time 0836  PT Stop Time 0912  PT Time Calculation (min) 36 min  Subjective Data  Subjective Pt received supine in bed with c/o 4.5/10 L knee pain  Precautions  Precautions Knee  Restrictions  Weight Bearing Restrictions Yes  LLE Weight Bearing WBAT  Home Living  Lives With Significant other  Available Help at Discharge Family;Available 24 hours/day  Type of Home House  Home Access Level entry  Home Layout Two level  Alternate Level Stairs-Number of Steps 3  Alternate Level Stairs-Rails Can reach both  Bathroom Shower/Tub Walk-in Therapist, art Yes  How Accessible Accessible via walker  Home Adaptive Equipment Bedside commode/3-in-1;Built-in shower seat;Hand-held shower hose;Walker - rolling  Prior Function  Level of Independence Independent  Able to Take Stairs? 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  Communication  Communication No difficulties  Cognition  Overall Cognitive Status Appears within functional limits for tasks assessed/performed  Arousal/Alertness Awake/alert  Orientation Level Oriented X4 / Intact  Behavior During Session [facility] for tasks performed  Right Upper  Extremity Assessment  RUE ROM/Strength/Tone WFL for tasks assessed  Left Upper Extremity Assessment  LUE ROM/Strength/Tone WFL  Right Lower Extremity Assessment  RLE ROM/Strength/Tone Deficits (grossly 3+/5)  Left Lower Extremity Assessment  LLE ROM/Strength/Tone Deficits;Due to pain  LLE ROM/Strength/Tone Deficits initiate quad set, 45 deg active knee flex  Trunk Assessment  Trunk Assessment Normal  Bed Mobility  Bed Mobility Supine to Sit  Supine to Sit 3: Mod assist;HOB flat (no rail/HOB flat to mimic home set up)  Details for Bed Mobility Assistance assist for trunk elevation due to obesity and minA to initiate L LE mvmt to EOB, increased time required  Transfers  Transfers Sit to Stand;Stand to Sit  Sit to Stand 3: Mod assist;With upper extremity assist;From bed  Stand to Sit 4: Min assist;With armrests;To chair/3-in-1  Details for Transfer Assistance pt requires forward momentum to stand up PTA and now requires assist to achieve full upright posture. max directional verbal cues to push up with R LE and bilat UE. Patient reports "My R leg is weaker than my left."  Ambulation/Gait  Ambulation/Gait Assistance 4: Min assist  Ambulation Distance (Feet) 12 Feet  Assistive device Rolling walker  Ambulation/Gait Assistance Details max verbal cues for walker management/sequencing, v/c's to increased UE weightbearing and L quad set when advancing R LE to prevent buckling. Patient with one episode of buckling but able to  recover balance I'ly.  Gait Pattern Step-to pattern;Decreased step length - left;Decreased stance time - left;Antalgic  Gait velocity slow  Stairs No  Exercises  Exercises Total Joint (hand out provided)  Total Joint Exercises  Ankle Circles/Pumps AROM;Both;Supine;10 reps  [employer] AROM;10 reps;Left;Supine  Heel Slides AROM;Left;10 reps;Supine  Goniometric ROM 45 deg active L knee flex in supine  PT - End of Session  Equipment Utilized During Treatment Gait belt    Activity Tolerance Patient limited by pain;Patient limited by fatigue  Patient left in chair;with call bell/phone within reach  Nurse Communication Mobility status  PT Assessment  Clinical Impression Statement Pt s/p L TKA presenting with morbid obesity, increased L Knee pain, decreased bilat LE strength and decreased L knee ROM. Patient reports strong desire to return home however is anxious re: steps to access bed/bath. PT also with current concerns re: steps due to patient with weak R LE as well. Patient with good home support. Will trial steps tomorrow in AM to assess if patient is safe to return home.  PT Recommendation/Assessment Patient needs continued PT services  PT Problem List Decreased strength;Decreased range of motion;Decreased balance;Decreased activity tolerance  Barriers to Discharge (3 steps to access bed/bath, unable to stay on main level)  PT Therapy Diagnosis  Difficulty walking;Abnormality of gait;Generalized weakness;Acute pain  PT Plan  PT Frequency 7X/week  PT Treatment/Interventions DME instruction;Gait training;Stair training;Functional mobility training;Therapeutic activities;Therapeutic exercise;Balance training  PT Recommendation  Follow Up Recommendations Home health PT;Supervision/Assistance - 24 hour  Equipment Recommended None recommended by PT (pt already provided with equip)  Individuals Consulted  Consulted and Agree with Results and Recommendations Patient  Acute Rehab PT Goals  PT Goal Formulation With patient  Time For Goal Achievement 02/21/12  Potential to Achieve Goals Good  Pt will go Supine/Side to Sit Independently;with HOB 0 degrees  PT Goal: Supine/Side to Sit - Progress Goal set today  Pt will go Sit to Supine/Side Independently;with HOB 0 degrees  PT Goal: Sit to Supine/Side - Progress Goal set today  Pt will go Sit to Stand with modified independence (up to RW.)  PT Goal: Sit to Stand - Progress Goal set today  Pt will Ambulate 51 -  150 feet;with modified independence;with rolling walker  PT Goal: Ambulate - Progress Goal set today  Pt will Go Up / Down Stairs 3-5 stairs;with min assist;with rail(s)  PT Goal: Up/Down Stairs - Progress Goal set today  Pt will Perform Home Exercise Program Independently  PT Goal: Perform Home Exercise Program - Progress Goal set today  Written Expression  Dominant Hand Right     Pain: 4.5/10 L knee pain.  Lewis Shock, PT, DPT Pager #: [phone] Office #: [phone]

## 2012-02-14 NOTE — Progress Notes (Signed)
Georgena Spurling, MD   Altamese Cabal, PA-C [address] [place], [place], [place]  [address]                             [phone]   PROGRESS NOTE  Subjective:  negative for Chest Pain  negative for Shortness of Breath  negative for Nausea/Vomiting   negative for Calf Pain  negative for Bowel Movement   Tolerating Diet: yes         Patient reports pain as 5 on 0-10 scale.    Objective: Vital signs in last 24 hours:   Patient Vitals for the past 24 hrs:  BP Temp Temp src Pulse Resp SpO2  02/14/12 0554 135/67 mmHg 98.3 F (36.8 C) - 80  18  100 %  02/14/12 0151 140/58 mmHg 98.9 F (37.2 C) - 86  18  98 %  02/13/12 2039 174/81 mmHg 98.2 F (36.8 C) Oral 97  20  98 %  02/13/12 1640 158/81 mmHg 97.9 F (36.6 C) Oral 85  18  97 %  02/13/12 1554 148/70 mmHg 98.1 F (36.7 C) Oral 86  18  97 %  02/13/12 1520 142/58 mmHg 97.9 F (36.6 C) - 87  14  99 %  02/13/12 1515 - - - 82  12  96 %  02/13/12 1505 141/65 mmHg - - 84  11  96 %  02/13/12 1500 - - - 86  19  98 %  02/13/12 1452 161/56 mmHg - - 89  19  97 %  02/13/12 1445 - - - 85  17  99 %  02/13/12 1435 136/40 mmHg - - 87  18  96 %  02/13/12 1430 136/65 mmHg - - 87  16  92 %  02/13/12 1420 125/103 mmHg - - 89  16  93 %  02/13/12 1415 - - - 93  17  95 %  02/13/12 1405 139/59 mmHg - - 85  21  95 %  02/13/12 1400 - - - 85  20  93 %  02/13/12 1350 116/60 mmHg - - 92  20  91 %  02/13/12 1345 - - - 92  19  97 %  02/13/12 1335 100/83 mmHg - - 92  24  97 %  02/13/12 1330 - - - 88  17  97 %  02/13/12 1320 161/58 mmHg 97 F (36.1 C) - - 21  -    @flow {1959:LAST@   Intake/Output from previous day:   05/20 0701 - 05/21 0700 In: 3400 [P.O.:600; I.V.:2750] Out: 1425 [Urine:800; Drains:575]   Intake/Output this shift:       Intake/Output      05/20 0701 - 05/21 0700 05/21 0701 - 05/22 0700   P.O. 600    I.V. 2750    IV Piggyback 50    Total Intake 3400    Urine 800    Drains 575    Blood 50    Total Output 1425    Net  +1975            LABORATORY DATA:  Basename 02/14/12 0535 02/07/12 1540  WBC 13.7* 9.5  HGB 9.7* 12.8  HCT 30.8* 39.6  PLT 224 252    Basename 02/14/12 0535 02/07/12 1540  NA 138 139  K 4.3 3.8  CL 101 100  CO2 30 30  BUN 15 16  CREATININE 0.85 0.71  GLUCOSE 123* 90  CALCIUM 8.7 10.0  Lab Results  Component Value Date   INR 0.92 02/07/2012    Examination:  General appearance: alert, cooperative and no distress Extremities: Homans sign is negative, no sign of DVT  Wound Exam: clean, dry, intact   Drainage:  None: wound tissue dry  Motor Exam: EHL and FHL Intact  Sensory Exam: Deep Peroneal normal  Vascular Exam:    Assessment:    1 Day Post-Op  Procedure(s) (LRB): TOTAL KNEE ARTHROPLASTY (Left)  ADDITIONAL DIAGNOSIS:  Active Problems:  * No active hospital problems. *   Acute Blood Loss Anemia   Plan: Physical Therapy as ordered Weight Bearing as Tolerated (WBAT)  DVT Prophylaxis:  Lovenox  DISCHARGE PLAN: Home  DISCHARGE NEEDS: HHPT, CPM, Walker and 3-in-1 comode seat         Ezelle Surprenant 02/14/2012, 8:32 AM

## 2012-02-14 NOTE — Progress Notes (Signed)
Occupational Therapy Evaluation Patient Details Name: [name] MRN: [id number] DOB: [birth year] Today's Date: 02/14/2012 Time: 1015-     OT Assessment / Plan / Recommendation Clinical Impression  71 yo s/p L TKA. Pt will benefit from skilled OT services to max indep with ADL and functional mobility for ADL to D/C home with A of family. Pt will be ready for D/C on Thursday.    OT Assessment  Patient needs continued OT Services    Follow Up Recommendations  No OT follow up    Barriers to Discharge None    Equipment Recommendations  None recommended by PT    Recommendations for Other Services    Frequency  Min 2X/week    Precautions / Restrictions Precautions Precautions: Knee Restrictions Weight Bearing Restrictions: Yes LLE Weight Bearing: Weight bearing as tolerated   Pertinent Vitals/Pain 4. No pain meds requested.    ADL  Eating/Feeding: Performed;Independent Where Assessed - Eating/Feeding: Chair Grooming: Performed;Set up Where Assessed - Grooming: Unsupported sitting Upper Body Bathing: Simulated;Set up Where Assessed - Upper Body Bathing: Unsupported sitting Lower Body Bathing: Simulated;Maximal assistance Where Assessed - Lower Body Bathing: Supported sit to stand Upper Body Dressing: Simulated;Set up Where Assessed - Upper Body Dressing: Supported sitting Lower Body Dressing: Simulated;Maximal assistance Where Assessed - Lower Body Dressing: Sopported sit to stand Toilet Transfer: Performed;+2 Total assistance Toilet Transfer Method: Stand pivot Toilet Transfer Equipment: Extra wide bedside commode Toileting - Clothing Manipulation and Hygiene: Performed;Moderate assistance Where Assessed - Toileting Clothing Manipulation and Hygiene: Standing Equipment Used: Gait belt;Rolling walker;Reacher Transfers/Ambulation Related to ADLs: Mod A ADL Comments: A for LB ADL. Will benefit from AE. Needs a toilet aid.    OT Diagnosis: Generalized weakness;Acute pain    OT Problem List: Decreased strength;Decreased range of motion;Decreased knowledge of use of DME or AE;Decreased knowledge of precautions;Pain OT Treatment Interventions: Self-care/ADL training;Energy conservation;DME and/or AE instruction;Therapeutic activities;Patient/family education   OT Goals Acute Rehab OT Goals OT Goal Formulation: With patient Time For Goal Achievement: 02/21/12 Potential to Achieve Goals: Good ADL Goals Pt Will Perform Lower Body Bathing: with supervision;with caregiver independent in assisting;with adaptive equipment;Unsupported;Other (comment) (sit to stand 3 in 1) ADL Goal: Lower Body Bathing - Progress: Goal set today Pt Will Perform Lower Body Dressing: with supervision;with adaptive equipment;Unsupported;Sit to stand from chair (elevated) ADL Goal: Lower Body Dressing - Progress: Goal set today Pt Will Transfer to Toilet: with supervision;with caregiver independent in assisting;Extra wide 3-in-1;with DME ADL Goal: Toilet Transfer - Progress: Goal set today Pt Will Perform Toileting - Clothing Manipulation: with modified independence;Standing ADL Goal: Toileting - Clothing Manipulation - Progress: Goal set today Pt Will Perform Toileting - Hygiene: with modified independence;with adaptive equipment;Standing at 3-in-1/toilet ADL Goal: Toileting - Hygiene - Progress: Goal set today Pt Will Perform Tub/Shower Transfer: with supervision;with DME;Other (comment) (3 in 1) ADL Goal: Tub/Shower Transfer - Progress: Goal set today  Visit Information  Last OT Received On: 02/14/12 Assistance Needed: +2    Subjective Data   I don't think I'll be ready to go home tomorrow.   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 Communication Communication: No difficulties Dominant Hand: Right    Cognition  Overall Cognitive Status: Appears within functional limits for tasks assessed/performed Arousal/Alertness: Awake/alert Orientation Level: Oriented X4 / Intact Behavior During Session: [facility] for tasks performed    Extremity/Trunk Assessment Right Upper Extremity Assessment RUE ROM/Strength/Tone: [facility] for tasks assessed Left Upper Extremity Assessment LUE ROM/Strength/Tone: Within functional levels Right Lower Extremity Assessment RLE ROM/Strength/Tone: WFL for tasks assessed Left Lower Extremity Assessment LLE ROM/Strength/Tone: Deficits;Due to pain LLE ROM/Strength/Tone Deficits: initiate quad set, 45 deg active knee flex Trunk Assessment Trunk Assessment: Normal   Mobility Bed Mobility Bed Mobility: Supine to Sit Supine to Sit: 3: Mod assist;HOB flat (no rail/HOB flat to mimic home set up) Details for Bed Mobility Assistance: assist for trunk elevation due to obesity and minA to initiate L LE mvmt to EOB, increased time required Transfers Transfers: Sit to Stand;Stand to Sit Sit to Stand: 3: Mod assist Stand to Sit: 3: Mod assist;With upper extremity assist Details for Transfer Assistance: pt requires forward momentum to stand up PTA and now requires assist to achieve full upright posture. max directional verbal cues to push up with R LE and bilat UE. Patient reports "My R leg is weaker than my left."   Exercise Total Joint Exercises Ankle Circles/Pumps: AROM;Both;Supine;10 reps Quad Sets: AROM;10 reps;Left;Supine Heel Slides:  AROM;Left;10 reps;Supine Goniometric ROM: 45 deg active L knee flex in supine  Balance  Fair  End of Session OT - End of Session Equipment Utilized During Treatment: Gait belt Activity Tolerance: Patient tolerated treatment well Patient left: in chair;with call bell/phone within reach Nurse Communication: Mobility status   Clay Solum,HILLARY 02/14/2012, 12:13 PM [facility], OTR/L  [phone] 02/14/2012

## 2012-02-14 NOTE — Progress Notes (Signed)
Occupational Therapy Treatment Patient Details Name: [name] MRN: [id number] DOB: Jan 27, 1941 Today's Date: 02/14/2012 Time: 1478-2956 OT Time Calculation (min): 20 min  OT Assessment / Plan / Recommendation Comments on Treatment Session Good participation. Will be ready to D/C Thursday - will benefitf rom therapy on Wednesday to incrase safety and independence with ADL and mobility dueto pain, limited ROM and obesity.    Follow Up Recommendations  No OT follow up    Barriers to Discharge   none    Equipment Recommendations  Other (comment) (AE recommended, including toilet aid.)    Recommendations for Other Services    Frequency Min 2X/week   Plan Discharge plan remains appropriate    Precautions / Restrictions Precautions Precautions: Knee Restrictions Weight Bearing Restrictions: Yes LLE Weight Bearing: Weight bearing as tolerated   Pertinent Vitals/Pain No request for pain meds    ADL  Lower Body Bathing: Simulated;Moderate assistance (long handled sponge) Lower Body Dressing: Simulated;Moderate assistance (with reacher) Toileting - Clothing Manipulation and Hygiene: Simulated;Minimal assistance Equipment Used: Gait belt;Rolling walker;Reacher ADL Comments: Focus on hygiene after toileting with toilet aid. Also educated pt on use of AE. Pt bought hip kit to increase independence with ADL.    OT Goals Acute Rehab OT Goals OT Goal Formulation: With patient Time For Goal Achievement: 02/21/12 Potential to Achieve Goals: Good ADL Goals Pt Will Perform Lower Body Bathing: with supervision;with caregiver independent in assisting;with adaptive equipment;Unsupported;Other (comment) ADL Goal: Lower Body Bathing - Progress: Progressing toward goals Pt Will Perform Lower Body Dressing: with supervision;with adaptive equipment;Unsupported;Sit to stand from chair ADL Goal: Lower Body Dressing - Progress: Progressing toward goals Pt Will Transfer to Toilet: with  supervision;with caregiver independent in assisting;Extra wide 3-in-1;with DME ADL Goal: Toilet Transfer - Progress: Progressing toward goals Pt Will Perform Toileting - Clothing Manipulation: with modified independence;Standing ADL Goal: Toileting - Clothing Manipulation - Progress: Progressing toward goals Pt Will Perform Toileting - Hygiene: with modified independence;with adaptive equipment;Standing at 3-in-1/toilet ADL Goal: Toileting - Hygiene - Progress: Progressing toward goals Pt Will Perform Tub/Shower Transfer: with supervision;with DME;Other (comment) ADL Goal: Tub/Shower Transfer - Progress: Progressing toward goals  Visit Information  Last OT Received On: 02/14/12                                  End of Session OT - End of Session Equipment Utilized During Treatment: Gait belt Activity Tolerance: Patient tolerated treatment well Patient left: in chair;with call bell/phone within reach Nurse Communication: Mobility status   Florie Carico,HILLARY 02/14/2012, 2:26 PM [facility], OTR/L  [phone] 02/14/2012

## 2012-02-14 NOTE — Progress Notes (Signed)
Physical Therapy Treatment Note   02/14/12 1420  PT Visit Information  Last PT Received On 02/14/12  Assistance Needed +1  PT Time Calculation  PT Start Time 1420  PT Stop Time 1444  PT Time Calculation (min) 24 min  Subjective Data  Subjective Pt received sitting up in chair.  Precautions  Precautions Knee  Restrictions  LLE Weight Bearing WBAT  Cognition  Overall Cognitive Status Appears within functional limits for tasks assessed/performed  Arousal/Alertness Awake/alert  Orientation Level Oriented X4 / Intact  Behavior During Session [facility] for tasks performed  Bed Mobility  Bed Mobility Sit to Supine  Sit to Supine 4: Min assist;HOB flat  Details for Bed Mobility Assistance assist for L LE  Transfers  Transfers Sit to Stand;Stand to Sit  Sit to Stand 4: Min assist;With armrests;From chair/3-in-1  Stand to Sit 4: Min assist;With armrests;To bed;To chair/3-in-1  Details for Transfer Assistance pt much imrpved from this AM, however has bilat arm rest to push up from  Ambulation/Gait  Ambulation/Gait Assistance 4: Min assist  Ambulation Distance (Feet) 20 Feet  Assistive device Rolling walker  Ambulation/Gait Assistance Details no episodes of knee buckling, improved sequencing  Gait Pattern Step-to pattern;Decreased step length - left;Decreased stance time - left;Antalgic  Gait velocity improved from AM  Stairs No  PT - End of Session  Equipment Utilized During Treatment Gait belt  Activity Tolerance Patient limited by fatigue  Patient left in bed;in CPM - set 0-70 deg;with call bell/phone within reach  PT - Assessment/Plan  Comments on Treatment Session Pt assisted to/from commode with v/c's fro walker magagement. Pt I with hygiene s/p urination in standing. Patient with significant improvement from AM however PT and pt remains to have concerns regarding stair negotiation due to patient report "My right LE can't pull my other leg up." Will trial stairs tomorrow AM.  PT Plan  Discharge plan remains appropriate;Frequency remains appropriate  PT Frequency 7X/week  Follow Up Recommendations Home health PT;Supervision/Assistance - 24 hour  Acute Rehab PT Goals  Time For Goal Achievement 02/21/12  Potential to Achieve Goals Good  PT Goal: Sit to Supine/Side - Progress Progressing toward goal  PT Goal: Sit to Stand - Progress Progressing toward goal  PT Goal: Ambulate - Progress Progressing toward goal  PT Goal: Perform Home Exercise Program - Progress Progressing toward goal     Pain: pt did not rate L LE pain at this time.  Lewis Shock, PT, DPT Pager #: [phone] Office #: [phone]

## 2012-02-14 NOTE — Progress Notes (Signed)
CARE MANAGEMENT NOTE 02/14/2012  Patient:  [facility]   Account Number:  [id number]  Date Initiated:  02/14/2012  Documentation initiated by:  Vance Peper  Subjective/Objective Assessment:   71 yr old female s/p left total knee arthroplasty     Action/Plan:   Spoke with patient regarding HH needs at discharge, choicde offered. Patient lives in [place] and wants [facility]. Therapist Grace Bushy. Called [facility] and faxed information and orders.Start of care will be 02/17/12.   Anticipated DC Date:  02/16/2012   Anticipated DC Plan:  HOME W HOME HEALTH SERVICES      DC Planning Services  CM consult      [facility]  HOME HEALTH   Choice offered to / List presented to:  C-1 Patient        HH arranged  HH-2 PT      [facility] agency  [facility]   Status of service:  Completed, signed off    Discharge Disposition:  HOME W HOME HEALTH SERVICES  Comments:  02/14/12 1348 Vance Peper, RN BSN Case Manager Rolling walker, CPM and 3in1 have been delivered to patient's home and she has family assistance at discharge.    [facility] [place]   Serves [place], [place], [place], [place], [place], [place])  [facility]  Telephone Number  [facility] [phone] Fax [phone]  [facility] [phone] Fax [phone]  [facility]  [phone] Fax [phone]  [facility] [phone] Fax [phone]  [facility] the [place] [phone] Fax [phone]  [facility] Care [phone] Fax [phone]  The[facility] Direct - PT only [phone] Fax [phone]  [facility]

## 2012-02-15 LAB — BASIC METABOLIC PANEL
BUN: 21 mg/dL (ref 6–23)
CO2: 27 mEq/L (ref 19–32)
Calcium: 8.4 mg/dL (ref 8.4–10.5)
Chloride: 97 mEq/L (ref 96–112)
Creatinine, Ser: 1.02 mg/dL (ref 0.50–1.10)
GFR calc Af Amer: 63 mL/min — ABNORMAL LOW (ref >90)
GFR calc non Af Amer: 54 mL/min — ABNORMAL LOW (ref >90)
Glucose, Bld: 105 mg/dL — ABNORMAL HIGH (ref 70–99)
Potassium: 3.4 mEq/L — ABNORMAL LOW (ref 3.5–5.1)
Sodium: 133 mEq/L — ABNORMAL LOW (ref 135–145)

## 2012-02-15 LAB — CBC
HCT: 25.6 % — ABNORMAL LOW (ref 36.0–46.0)
Hemoglobin: 8.4 g/dL — ABNORMAL LOW (ref 12.0–15.0)
MCH: 30 pg (ref 26.0–34.0)
MCHC: 32.8 g/dL (ref 30.0–36.0)
MCV: 91.4 fL (ref 78.0–100.0)
Platelets: 176 10*3/uL (ref 150–400)
RBC: 2.8 MIL/uL — ABNORMAL LOW (ref 3.87–5.11)
RDW: 14.4 % (ref 11.5–15.5)
WBC: 11.5 10*3/uL — ABNORMAL HIGH (ref 4.0–10.5)

## 2012-02-15 NOTE — Progress Notes (Signed)
Occupational Therapy Treatment Patient Details Name: [name] MRN: [id number] DOB: [birth year] Today's Date: 02/15/2012 Time: 1530-1600 OT Time Calculation (min): 30 min  OT Assessment / Plan / Recommendation Comments on Treatment Session Daughter demonstrated understanding of learned tasks. Written infromation reviewed with daughter. Pt states she feels that she is making progress.    Follow Up Recommendations  Home health OT    Barriers to Discharge       Equipment Recommendations  None recommended by OT    Recommendations for Other Services    Frequency Min 2X/week   Plan Discharge plan remains appropriate    Precautions / Restrictions Precautions Precautions: Knee Restrictions LLE Weight Bearing: Weight bearing as tolerated   Pertinent Vitals/Pain 3    ADL  Upper Body Dressing:  (with use of AE) Lower Body Dressing: Performed;Minimal assistance Where Assessed - Lower Body Dressing: Sopported sit to stand (with AE) Toilet Transfer: Performed;Minimal assistance Toilet Transfer Method: Stand pivot Toilet Transfer Equipment: Extra wide bedside commode Toileting - Clothing Manipulation and Hygiene: Performed;Modified independent Where Assessed - Toileting Clothing Manipulation and Hygiene: Standing Tub/Shower Transfer: Engineer, manufacturing Method: Science writer: Other (comment) (3 in 1) Equipment Used: Gait belt;Rolling walker;Reacher Transfers/Ambulation Related to ADLs: Min a with ambulation. A required for sit - stand ADL Comments: Educated daughter on AE for ADL and mobility for ADL. Daughter completed transfers with her mother, demonstrating understanding of techniques. Written  information given on shower transfers. Discussed using 3 in1 in shower to assist with sit to stand. Also discussed building up chairs to sit in at home to increase safety and independence with transfers.      OT Goals Acute Rehab OT  Goals OT Goal Formulation: With patient Time For Goal Achievement: 02/21/12 Potential to Achieve Goals: Good ADL Goals Pt Will Perform Lower Body Bathing: with supervision;with caregiver independent in assisting;with adaptive equipment;Unsupported;Other (comment) ADL Goal: Lower Body Bathing - Progress: Progressing toward goals Pt Will Perform Lower Body Dressing: with supervision;with adaptive equipment;Unsupported;Sit to stand from chair ADL Goal: Lower Body Dressing - Progress: Progressing toward goals Pt Will Transfer to Toilet: with supervision;with caregiver independent in assisting;Extra wide 3-in-1;with DME ADL Goal: Toilet Transfer - Progress: Progressing toward goals Pt Will Perform Toileting - Clothing Manipulation: with modified independence;Standing ADL Goal: Toileting - Clothing Manipulation - Progress: Met Pt Will Perform Toileting - Hygiene: with modified independence;with adaptive equipment;Standing at 3-in-1/toilet ADL Goal: Toileting - Hygiene - Progress: Met Pt Will Perform Tub/Shower Transfer: with supervision;with DME;Other (comment) ADL Goal: Tub/Shower Transfer - Progress: Progressing toward goals  Visit Information  Last OT Received On: 02/15/12 Assistance Needed: +1    Subjective Data   You've worked me hard today         Cognition  Overall Cognitive Status: Appears within functional limits for tasks assessed/performed Arousal/Alertness: Awake/alert Orientation Level: Oriented X4 / Intact Behavior During Session: [facility] for tasks performed    Mobility Bed Mobility Bed Mobility: Sit to Supine Supine to Sit: 4: Min assist (performed with daughter.) Transfers Transfers: Sit to Stand;Stand to Sit Sit to Stand: 3: Mod assist;With upper extremity assist;From chair/3-in-1 Stand to Sit: Other (comment) (min A from 3 in 1) Details for Transfer Assistance: increased independence from higher surface   Exercises Total Joint Exercises Ankle Circles/Pumps:  AROM;Both;20 reps;Seated Quad Sets: AROM;Both;20 reps;Seated Straight Leg Raises: AAROM;Left;10 reps;Supine Long Arc Quad: AROM;10 reps;Left;Seated Knee Flexion: AROM;Left;10 reps;Seated  Balance  supervision  End of Session OT - End of Session Equipment Utilized During  Treatment: Gait belt Activity Tolerance: Patient tolerated treatment well Patient left: in bed;with call bell/phone within reach;with family/visitor present Nurse Communication: Other (comment) (need for CPM) CPM Left Knee CPM Left Knee: On Left Knee Flexion (Degrees): 72  Left Knee Extension (Degrees): 0    Yanis Juma,HILLARY 02/15/2012, 4:51 PM [facility], OTR/L  [phone] 02/15/2012

## 2012-02-15 NOTE — Progress Notes (Signed)
  Georgena Spurling, MD   Altamese Cabal, PA-C [address] [place], [place], [place]  [address]                             [phone]   PROGRESS NOTE  Subjective:  negative for Chest Pain  negative for Shortness of Breath  negative for Nausea/Vomiting   negative for Calf Pain  negative for Bowel Movement   Tolerating Diet: yes         Patient reports pain as 4 on 0-10 scale.    Objective: Vital signs in last 24 hours:   Patient Vitals for the past 24 hrs:  BP Temp Pulse Resp SpO2  02/15/12 1446 132/59 mmHg 97.9 F (36.6 C) 97  20  96 %  02/15/12 1120 139/75 mmHg - - - -  02/15/12 1116 139/75 mmHg - - - -  02/15/12 0614 120/64 mmHg 99.1 F (37.3 C) 88  20  97 %  02/14/12 2144 143/76 mmHg 99.5 F (37.5 C) 65  19  92 %    @flow {1959:LAST@   Intake/Output from previous day:   05/21 0701 - 05/22 0700 In: 290 [P.O.:240] Out: 425 [Urine:125; Drains:300]   Intake/Output this shift:   05/22 0701 - 05/22 1900 In: 480 [P.O.:480] Out: 450 [Urine:450]   Intake/Output      05/21 0701 - 05/22 0700 05/22 0701 - 05/23 0700   P.O. 240 480   I.V.     IV Piggyback 50    Total Intake 290 480   Urine 125 450   Drains 300    Blood     Total Output 425 450   Net -135 +30        Urine Occurrence 1 x       LABORATORY DATA:  Basename 02/15/12 0605 02/14/12 0535  WBC 11.5* 13.7*  HGB 8.4* 9.7*  HCT 25.6* 30.8*  PLT 176 224    Basename 02/15/12 0605 02/14/12 0535  NA 133* 138  K 3.4* 4.3  CL 97 101  CO2 27 30  BUN 21 15  CREATININE 1.02 0.85  GLUCOSE 105* 123*  CALCIUM 8.4 8.7   Lab Results  Component Value Date   INR 0.92 02/07/2012    Examination:  General appearance: alert, cooperative and no distress Extremities: Homans sign is negative, no sign of DVT  Wound Exam: clean, dry, intact   Drainage:  None: wound tissue dry  Motor Exam: EHL and FHL Intact  Sensory Exam: Deep Peroneal normal  Vascular Exam:    Assessment:    2 Days Post-Op  Procedure(s)  (LRB): TOTAL KNEE ARTHROPLASTY (Left)  ADDITIONAL DIAGNOSIS:  Active Problems:  * No active hospital problems. *   Acute Blood Loss Anemia   Plan: Physical Therapy as ordered Weight Bearing as Tolerated (WBAT)  DVT Prophylaxis:  Lovenox  DISCHARGE PLAN: Skilled Nursing Facility/Rehab  DISCHARGE NEEDS: HHPT, CPM, Walker and 3-in-1 comode seat         Danni Leabo 02/15/2012, 3:32 PM

## 2012-02-15 NOTE — Progress Notes (Signed)
PT Progress Note:     02/15/12 1300  PT Visit Information  Last PT Received On 02/15/12  Assistance Needed +1  Precautions  Precautions Knee  Restrictions  LLE Weight Bearing WBAT  Cognition  Overall Cognitive Status Appears within functional limits for tasks assessed/performed  Arousal/Alertness Awake/alert  Orientation Level Oriented X4 / Intact  Behavior During Session [facility] for tasks performed  Bed Mobility  Bed Mobility Not assessed  Transfers  Transfers Sit to Stand;Stand to Sit  Sit to Stand 3: Mod assist;With upper extremity assist;With armrests;From chair/3-in-1  Stand to Sit 4: Min assist;With armrests;With upper extremity assist;To chair/3-in-1  Details for Transfer Assistance Continues to require Mod (A) to achieve full upright standing- still increased difficulty from midpoint & (A) to control descent.    Ambulation/Gait  Ambulation/Gait Assistance 4: Min guard  Ambulation Distance (Feet) 60 Feet  Assistive device Rolling walker  Ambulation/Gait Assistance Details Cues for upright posture, look ahead, advancement of RW.    Gait Pattern Step-to pattern;Decreased stance time - left;Decreased step length - right  Stairs Yes  Stairs Assistance 3: Mod assist  Stairs Assistance Details (indicate cue type and reason) (A) to stabilize RW, position RW, & balance for pt.  Cues for technique & placement of RW.  Pt's daughter & Son-in-law present.    Stair Management Technique No rails;Backwards;Step to pattern;With walker  Number of Stairs 2   Wheelchair Mobility  Wheelchair Mobility No  Exercises  Exercises Total Joint  Total Joint Exercises  Ankle Circles/Pumps AROM;Both;20 reps;Seated  Quad Sets AROM;Both;20 reps;Seated  Straight Leg Raises AAROM;Left;10 reps;Supine  Long Arc Quad AROM;10 reps;Left;Seated  Knee Flexion AROM;Left;10 reps;Seated  PT - End of Session  Equipment Utilized During Treatment Gait belt  Activity Tolerance Patient tolerated treatment  well;Patient limited by pain  Patient left in chair;with call bell/phone within reach;with family/visitor present  PT - Assessment/Plan  Comments on Treatment Session Pt with improved mobility this session.  Still feel pt would benefit from another day in hospital to receive therapy before d/cing home.    PT Plan Discharge plan remains appropriate;Frequency remains appropriate  PT Frequency 7X/week  Follow Up Recommendations Home health PT;Supervision/Assistance - 24 hour  Equipment Recommended None recommended by PT  Acute Rehab PT Goals  Time For Goal Achievement 02/21/12  PT Goal: Sit to Stand - Progress Not progressing  PT Goal: Ambulate - Progress Progressing toward goal  PT Goal: Up/Down Stairs - Progress Progressing toward goal  PT Goal: Perform Home Exercise Program - Progress Progressing toward goal      Verdell Face, [place] [phone] 02/15/2012

## 2012-02-15 NOTE — Progress Notes (Signed)
Occupational Therapy Treatment Patient Details Name: [name] MRN: [id number] DOB: 1941-07-27 Today's Date: 02/15/2012 Time: 1445-1510 OT Time Calculation (min): 25 min  OT Assessment / Plan / Recommendation Comments on Treatment Session Making good progress. Plan to educate daughter this pm.    Follow Up Recommendations  Home health OT    Barriers to Discharge       Equipment Recommendations  None recommended by OT    Recommendations for Other Services    Frequency Min 2X/week   Plan Discharge plan remains appropriate    Precautions / Restrictions Precautions Precautions: Knee Restrictions LLE Weight Bearing: Weight bearing as tolerated   Pertinent Vitals/Pain 4    ADL  Upper Body Dressing:  (with use of AE) Lower Body Dressing: Performed;Minimal assistance Where Assessed - Lower Body Dressing: Sopported sit to stand (with AE) Toilet Transfer: Performed;Moderate assistance Toilet Transfer Equipment: Extra wide bedside commode Toileting - Clothing Manipulation and Hygiene: Performed;Supervision/safety Where Assessed - Toileting Clothing Manipulation and Hygiene: Standing ADL Comments: Educated pt on use of AE for LB ADL. Will educate daughter on arrival.     OT Goals Acute Rehab OT Goals OT Goal Formulation: With patient Time For Goal Achievement: 02/21/12 Potential to Achieve Goals: Good ADL Goals Pt Will Perform Lower Body Bathing: with supervision;with caregiver independent in assisting;with adaptive equipment;Unsupported;Other (comment) ADL Goal: Lower Body Bathing - Progress: Progressing toward goals Pt Will Perform Lower Body Dressing: with supervision;with adaptive equipment;Unsupported;Sit to stand from chair ADL Goal: Lower Body Dressing - Progress: Progressing toward goals Pt Will Transfer to Toilet: with supervision;with caregiver independent in assisting;Extra wide 3-in-1;with DME ADL Goal: Toilet Transfer - Progress: Progressing toward goals Pt Will  Perform Toileting - Clothing Manipulation: with modified independence;Standing ADL Goal: Toileting - Clothing Manipulation - Progress: Progressing toward goals Pt Will Perform Toileting - Hygiene: with modified independence;with adaptive equipment;Standing at 3-in-1/toilet ADL Goal: Toileting - Hygiene - Progress: Met Pt Will Perform Tub/Shower Transfer: with supervision;with DME;Other (comment) ADL Goal: Tub/Shower Transfer - Progress: Progressing toward goals  Visit Information  Last OT Received On: 02/15/12 Assistance Needed: +1    Subjective Data   I have had a good work out today   Prior Primary school teacher  Overall Cognitive Status: Appears within functional limits for tasks assessed/performed Arousal/Alertness: Awake/alert Orientation Level: Oriented X4 / Intact Behavior During Session: [facility] for tasks performed    Mobility Bed Mobility Bed Mobility: Not assessed Transfers Transfers: Sit to Stand;Stand to Sit Sit to Stand: 3: Mod assist;With upper extremity assist;From chair/3-in-1 Stand to Sit: 4: Min assist;With armrests;With upper extremity assist;To chair/3-in-1 Details for Transfer Assistance: vc for sequencing/safety. difficulty with transitional movement due to R knee and weight.   Exercises Total Joint Exercises Ankle Circles/Pumps: AROM;Both;20 reps;Seated Quad Sets: AROM;Both;20 reps;Seated Straight Leg Raises: AAROM;Left;10 reps;Supine Long Arc Quad: AROM;10 reps;Left;Seated Knee Flexion: AROM;Left;10 reps;Seated  Balance  supervision  End of Session  in chair. Call bell within reach.[facility], OTR/L  [phone] 02/15/2012   [name],HILLARY 02/15/2012, 4:19 PM

## 2012-02-15 NOTE — Progress Notes (Signed)
PT Progress Note:     02/15/12 0800  PT Visit Information  Last PT Received On 02/15/12  Assistance Needed +1  PT Time Calculation  PT Start Time 0730  PT Stop Time 0818  PT Time Calculation (min) 48 min  Subjective Data  Subjective "I'm not ready to go home today"  Precautions  Precautions Knee  Restrictions  LLE Weight Bearing WBAT  Cognition  Overall Cognitive Status Appears within functional limits for tasks assessed/performed  Arousal/Alertness Awake/alert  Orientation Level Oriented X4 / Intact  Behavior During Session Anxious  Bed Mobility  Bed Mobility Not assessed (pt received in recliner )  Transfers  Transfers Sit to Stand;Stand to Sit  Sit to Stand 3: Mod assist;With armrests;From chair/3-in-1;With upper extremity assist  Stand to Sit 4: Min assist;With upper extremity assist;With armrests;To chair/3-in-1  Details for Transfer Assistance (A) to achieve standing, balance, stabilize RW, control descent.  Cues for hand placement, LLE positioning, & use of UE's to control descent.  Pt requires increased Mod (A) to achieve full standing upright from midway point.  Increased time to achieve standing.   Ambulation/Gait  Ambulation/Gait Assistance 4: Min guard  Ambulation Distance (Feet) 25 Feet  Assistive device Rolling walker  Ambulation/Gait Assistance Details Cues for sequencing & upright posture.    Gait Pattern Step-to pattern;Decreased stance time - left;Decreased step length - right  Stairs Yes  Stairs Assistance 3: Mod assist;4: Min assist  Stairs Assistance Details (indicate cue type and reason) Max cueing for sequencing, use of UE's to offload weight, & technique.  Pt very fearful & anxious.  Performed 4 different ways: forwards with bil rails, sideways with 1 rail, backwards with bil rails, backwards with RW; Pt did better with backwards with RW technique.  Pt states she has bil rails at home but the rail on Rt side is not sturdy.    Stair Management Technique  No rails;One rail Left;Two rails;Step to pattern;Sideways;Backwards;Forwards;With walker  Number of Stairs 3  (4 trials)  Wheelchair Mobility  Wheelchair Mobility No  Total Joint Exercises  Ankle Circles/Pumps AROM;Both;20 reps;Seated  Quad Sets AROM;Both;20 reps;Seated  PT - End of Session  Equipment Utilized During Treatment Gait belt  Activity Tolerance Patient limited by fatigue;Patient limited by pain  Patient left in chair;with call bell/phone within reach  PT - Assessment/Plan  Comments on Treatment Session Initiated stair training with pt this session.  Performed 4 different ways.  Pt reports it's difficult for her to ascend/descend stairs due to not only recent LTKA but also "bad" Rt knee.  Pt reports being fearful- required max encouragement & cueing throughout.  Ashly Chadwell, PT, who worked with pt yesterday was present during today's stair education & reports pt's anxiety & performance is worse.  Pt states she does not feel ready to go home today- this clinician agrees that pt is not ready to d/c home.    PT Plan Discharge plan remains appropriate;Frequency remains appropriate  PT Frequency 7X/week  Follow Up Recommendations Home health PT;Supervision/Assistance - 24 hour  Equipment Recommended None recommended by PT  Acute Rehab PT Goals  Time For Goal Achievement 02/21/12  PT Goal: Sit to Stand - Progress Not progressing  PT Goal: Ambulate - Progress Not met  PT Goal: Up/Down Stairs - Progress Not met     Verdell Face, PTA [phone] 02/15/2012

## 2012-02-16 LAB — BASIC METABOLIC PANEL
BUN: 25 mg/dL — ABNORMAL HIGH (ref 6–23)
CO2: 26 mEq/L (ref 19–32)
Calcium: 8.4 mg/dL (ref 8.4–10.5)
Chloride: 96 mEq/L (ref 96–112)
Creatinine, Ser: 1.03 mg/dL (ref 0.50–1.10)
GFR calc Af Amer: 62 mL/min — ABNORMAL LOW (ref >90)
GFR calc non Af Amer: 53 mL/min — ABNORMAL LOW (ref >90)
Glucose, Bld: 98 mg/dL (ref 70–99)
Potassium: 3.4 mEq/L — ABNORMAL LOW (ref 3.5–5.1)
Sodium: 132 mEq/L — ABNORMAL LOW (ref 135–145)

## 2012-02-16 LAB — CBC
HCT: 25 % — ABNORMAL LOW (ref 36.0–46.0)
Hemoglobin: 8.1 g/dL — ABNORMAL LOW (ref 12.0–15.0)
MCH: 29.6 pg (ref 26.0–34.0)
MCHC: 32.4 g/dL (ref 30.0–36.0)
MCV: 91.2 fL (ref 78.0–100.0)
Platelets: 190 10*3/uL (ref 150–400)
RBC: 2.74 MIL/uL — ABNORMAL LOW (ref 3.87–5.11)
RDW: 14.1 % (ref 11.5–15.5)
WBC: 10.3 10*3/uL (ref 4.0–10.5)

## 2012-02-16 MED ORDER — METHOCARBAMOL 500 MG PO TABS: 500.0000 mg | ORAL_TABLET | Freq: Four times a day (QID) | ORAL | Status: AC | PRN

## 2012-02-16 MED ORDER — OXYCODONE HCL 10 MG PO TB12: 10.0000 mg | ORAL_TABLET | Freq: Two times a day (BID) | ORAL | Status: DC

## 2012-02-16 MED ORDER — ENOXAPARIN SODIUM 40 MG/0.4ML ~~LOC~~ SOLN: 40.0000 mg | Freq: Two times a day (BID) | SUBCUTANEOUS | Status: DC

## 2012-02-16 MED ORDER — OXYCODONE HCL 5 MG PO TABS: 5.0000 mg | ORAL_TABLET | ORAL | Status: AC | PRN

## 2012-02-16 MED ORDER — CEPHALEXIN 500 MG PO CAPS: 500.0000 mg | ORAL_CAPSULE | Freq: Four times a day (QID) | ORAL | Status: AC

## 2012-02-16 NOTE — Progress Notes (Signed)
  Georgena Spurling, MD   Altamese Cabal, PA-C [address] [place], [place], [place]  [address]                             [phone]   PROGRESS NOTE  Subjective:  negative for Chest Pain  negative for Shortness of Breath  negative for Nausea/Vomiting   negative for Calf Pain  negative for Bowel Movement   Tolerating Diet: yes         Patient reports pain as 4 on 0-10 scale.    Objective: Vital signs in last 24 hours:   Patient Vitals for the past 24 hrs:  BP Temp Pulse Resp SpO2  02/16/12 1134 164/70 mmHg - - - -  02/16/12 1133 164/70 mmHg - - - -  02/16/12 0658 162/74 mmHg 99.6 F (37.6 C) 84  22  95 %  02/15/12 2105 148/84 mmHg 99 F (37.2 C) 70  20  90 %  02/15/12 1446 132/59 mmHg 97.9 F (36.6 C) 97  20  96 %    @flow {1959:LAST@   Intake/Output from previous day:   05/22 0701 - 05/23 0700 In: 1370 [P.O.:720; I.V.:650] Out: 450 [Urine:450]   Intake/Output this shift:       Intake/Output      05/22 0701 - 05/23 0700 05/23 0701 - 05/24 0700   P.O. 720    I.V. 650    IV Piggyback     Total Intake 1370    Urine 450    Drains     Total Output 450    Net +920         Urine Occurrence 1 x 1 x      LABORATORY DATA:  Basename 02/16/12 0551 02/15/12 0605 02/14/12 0535  WBC 10.3 11.5* 13.7*  HGB 8.1* 8.4* 9.7*  HCT 25.0* 25.6* 30.8*  PLT 190 176 224    Basename 02/16/12 0551 02/15/12 0605 02/14/12 0535  NA 132* 133* 138  K 3.4* 3.4* 4.3  CL 96 97 101  CO2 26 27 30   BUN 25* 21 15  CREATININE 1.03 1.02 0.85  GLUCOSE 98 105* 123*  CALCIUM 8.4 8.4 8.7   Lab Results  Component Value Date   INR 0.92 02/07/2012    Examination:  General appearance: alert, cooperative and no distress Extremities: Homans sign is negative, no sign of DVT  Wound Exam: clean, dry, intact   Drainage:  Scant/small amount Serosanguinous exudate  Motor Exam: EHL and FHL Intact  Sensory Exam: Deep Peroneal normal  Vascular Exam:    Assessment:    3 Days Post-Op   Procedure(s) (LRB): TOTAL KNEE ARTHROPLASTY (Left)  ADDITIONAL DIAGNOSIS:  Active Problems:  * No active hospital problems. *   Acute Blood Loss Anemia   Plan: Physical Therapy as ordered Weight Bearing as Tolerated (WBAT)  DVT Prophylaxis:  Lovenox  DISCHARGE PLAN: Skilled Nursing Facility/Rehab  DISCHARGE NEEDS: HHPT, CPM, Walker and 3-in-1 comode seat         [name] 02/16/2012, 12:09 PM

## 2012-02-16 NOTE — Progress Notes (Signed)
Occupational Therapy Treatment Patient Details Name: [name] MRN: [id number] DOB: 1941/03/17 Today's Date: 02/16/2012 Time: 4098-1191 OT Time Calculation (min): 10 min  OT Assessment / Plan / Recommendation Comments on Treatment Session Pt. making good progress this session    Follow Up Recommendations  Home health OT       Equipment Recommendations  None recommended by OT       Frequency Min 2X/week   Plan Discharge plan remains appropriate    Precautions / Restrictions Precautions Precautions: Knee Restrictions Weight Bearing Restrictions: Yes LLE Weight Bearing: Weight bearing as tolerated   Pertinent Vitals/Pain "Not too bad"    ADL  Grooming: Performed;Set up Where Assessed - Grooming: Unsupported standing Toilet Transfer: Simulated;Min guard Statistician Method: Sit to Barista: Other (comment) Nurse, children's) Tub/Shower Transfer: Engineer, manufacturing Method: Science writer: Information systems manager with back Equipment Used: Paramedic Transfers/Ambulation Related to ADLs: MIn guard assist for balance and min verbal cues for hand placement and technique ADL Comments: Pt. educated on toilet hygiene aid use to increase independence with ADL task and on technique of use. Pt. provided with demonstration of shower transfer technique with use of RW and stepping into shower with strong leg first and use of grab bars for increased safety       OT Goals Acute Rehab OT Goals OT Goal Formulation: With patient Time For Goal Achievement: 02/21/12 Potential to Achieve Goals: Good ADL Goals Pt Will Perform Lower Body Bathing: with supervision;with caregiver independent in assisting;with adaptive equipment;Unsupported;Other (comment) ADL Goal: Lower Body Bathing - Progress: Progressing toward goals Pt Will Perform Lower Body Dressing: with supervision;with adaptive  equipment;Unsupported;Sit to stand from chair ADL Goal: Lower Body Dressing - Progress: Progressing toward goals Pt Will Transfer to Toilet: with supervision;with caregiver independent in assisting;Extra wide 3-in-1;with DME ADL Goal: Toilet Transfer - Progress: Progressing toward goals Pt Will Perform Tub/Shower Transfer: with supervision;with DME;Other (comment) ADL Goal: Tub/Shower Transfer - Progress: Met  Visit Information  Last OT Received On: 02/16/12 Assistance Needed: +1          Cognition  Overall Cognitive Status: Appears within functional limits for tasks assessed/performed    Mobility Transfers Transfers: Sit to Stand;Stand to Sit Sit to Stand: 4: Min assist;With armrests;From chair/3-in-1 Details for Transfer Assistance: increased independence from higher surface         End of Session OT - End of Session Equipment Utilized During Treatment: Gait belt Activity Tolerance: Patient tolerated treatment well Patient left: with call bell/phone within reach;with family/visitor present;in chair Nurse Communication: Mobility status   [name], OTR/L Pager [phone] 02/16/2012, 7:57 AM

## 2012-02-16 NOTE — Discharge Instructions (Signed)
Home Health to be provided by [facility]  [phone]  Diet: As you were doing prior to hospitalization   Activity:  Increase activity slowly as tolerated                  No lifting or driving for 6 weeks  Shower:  May shower without a dressing once there is no drainage from your wound.                 Do NOT wash over the wound.  If drainage remains, cover wound with saran                  Wrap and then shower.  Clean incision with betadine and change dressing                        After saran wrap removed.  Dressing:  You may change your dressing on Thursday                    Then change the dressing daily with sterile 4"x4"s gauze dressing                     And TED hose for knees.  Use paper tape to hold dressing in place                     For hips.  You may clean the incision with alcohol prior to redressing.  Weight Bearing:  Weight bearing as tolerated as taught in physical therapy.  Use a                                walker or Crutches as instructed.  To prevent constipation: you may use a stool softener such as -               Colace ( over the counter) 100 mg by mouth twice a day                Drink plenty of fluids ( prune juice may be helpful) and high fiber foods                Miralax ( over the counter) for constipation as needed.    Precautions:  If you experience chest pain or shortness of breath - call 911 immediately               For transfer to the hospital emergency department!!               If you develop a fever greater that 101 F, purulent drainage from wound,                             increased redness or drainage from wound, or calf pain -- Call the office at                                                 [phone].  Follow- Up Appointment:  Please call for an appointment to be seen on 02/24/12 [facility] [phone]  And confirm appointment to be seen on 02/28/12 [facility] - [phone]

## 2012-02-16 NOTE — Discharge Summary (Signed)
Tabitha Spurling, MD   Tabitha Cabal, PA-C [address] [place], [place], [place]  [address]                             [phone]  PATIENT ID: [name]        MRN:  [id number]          DOB/AGE: 12-05-1940 / 71 y.o.    DISCHARGE SUMMARY  ADMISSION DATE:    02/13/2012 DISCHARGE DATE:   02/16/2012   ADMISSION DIAGNOSIS: osteoarthritis left knee    DISCHARGE DIAGNOSIS:  osteoarthritis left knee    ADDITIONAL DIAGNOSIS: Active Problems:  * No active hospital problems. *   Past Medical History  Diagnosis Date  . PONV (postoperative nausea and vomiting)   . Hypertension   . Shortness of breath     WITH EXERTION   . Sleep apnea     NO MACHINE   . GERD (gastroesophageal reflux disease)   . Arthritis   . No pertinent past medical history     BIL CHINS RED, SWOLLEN SINCE 1985    PROCEDURE: Procedure(s): TOTAL KNEE ARTHROPLASTY on 02/13/2012  CONSULTS:     HISTORY:  See H&P in chart  HOSPITAL COURSE:  [name] is a 71 y.o. admitted on 02/13/2012 and found to have a diagnosis of osteoarthritis left knee.  After appropriate laboratory studies were obtained  they were taken to the operating room on 02/13/2012 and underwent Procedure(s): TOTAL KNEE ARTHROPLASTY.   They were given perioperative antibiotics:  Anti-infectives     Start     Dose/Rate Route Frequency Ordered Stop   02/16/12 0000   cephALEXin (KEFLEX) 500 MG capsule        500 mg Oral 4 times daily 02/16/12 1216 02/26/12 2359   02/13/12 1700   ceFAZolin (ANCEF) IVPB 2 g/50 mL premix        2 g 100 mL/hr over 30 Minutes Intravenous Every 6 hours 02/13/12 1557 27-Feb-2012 1423   02/12/12 0836   ceFAZolin (ANCEF) IVPB 2 g/50 mL premix        2 g 100 mL/hr over 30 Minutes Intravenous 60 min pre-op 02/12/12 0836 02/13/12 1119        .  Tolerated the procedure well.  Placed with a foley intraoperatively.  Given Ofirmev at induction and for 48 hours.    POD #1, allowed out of bed to a chair.  PT for ambulation  and exercise program.  Foley D/C'd in morning.  IV saline locked.  O2 discontionued.  POD #2, continued PT and ambulation.   Hemovac pulled. Marland Kitchen POD #3, continued PT and stairs  The remainder of the hospital course was dedicated to ambulation and strengthening.   The patient was discharged on 3 Days Post-Op in  Good condition.  Blood products given:none  DIAGNOSTIC STUDIES: Recent vital signs: Patient Vitals for the past 24 hrs:  BP Temp Pulse Resp SpO2  02/16/12 1134 164/70 mmHg - - - -  02/16/12 1133 164/70 mmHg - - - -  02/16/12 0658 162/74 mmHg 99.6 F (37.6 C) 84  22  95 %  02/27/12 2105 148/84 mmHg 99 F (37.2 C) 70  20  90 %  02-27-2012 1446 132/59 mmHg 97.9 F (36.6 C) 97  20  96 %       Recent laboratory studies:  Basename 02/16/12 0551 February 27, 2012 0605 02/14/12 0535  WBC 10.3 11.5* 13.7*  HGB 8.1* 8.4*  9.7*  HCT 25.0* 25.6* 30.8*  PLT 190 176 224    Basename 02/16/12 0551 02/15/12 0605 02/14/12 0535  NA 132* 133* 138  K 3.4* 3.4* 4.3  CL 96 97 101  CO2 26 27 30   BUN 25* 21 15  CREATININE 1.03 1.02 0.85  GLUCOSE 98 105* 123*  CALCIUM 8.4 8.4 8.7   Lab Results  Component Value Date   INR 0.92 02/07/2012     Recent Radiographic Studies :  Dg Chest 2 View  02/07/2012  *RADIOLOGY REPORT*  Clinical Data: Preop for left hip replacement  CHEST - 2 VIEW  Comparison: None  Findings: Cardiomediastinal silhouette is unremarkable.  No acute infiltrate or pleural effusion.  No pulmonary edema.  Minimal left basilar atelectasis or scarring.  Degenerative changes mid thoracic spine.  IMPRESSION: No active disease.  Degenerative changes mid thoracic spine.  Original Report Authenticated By: Natasha Mead, M.D.    DISCHARGE INSTRUCTIONS: Discharge Orders    Future Orders Please Complete By Expires   Diet - low sodium heart healthy      Call MD / Call 911      Comments:   If you experience chest pain or shortness of breath, CALL 911 and be transported to the hospital emergency  room.  If you develope a fever above 101 F, pus (white drainage) or increased drainage or redness at the wound, or calf pain, call your surgeon's office.   Constipation Prevention      Comments:   Drink plenty of fluids.  Prune juice may be helpful.  You may use a stool softener, such as Colace (over the counter) 100 mg twice a day.  Use MiraLax (over the counter) for constipation as needed.   Increase activity slowly as tolerated      Driving restrictions      Comments:   No driving for 6 weeks   Lifting restrictions      Comments:   No lifting for 6 weeks   CPM      Comments:   Continuous passive motion machine (CPM):      Use the CPM from 0 to 90 for 6-8 hours per day.      You may increase by 10 per day.  You may break it up into 2 or 3 sessions per day.      Use CPM for 2 weeks or until you are told to stop.   TED hose      Comments:   Use stockings (TED hose) for 3 weeks on both leg(s).  You may remove them at night for sleeping.   Change dressing      Comments:   Change dressing on thursday, then change the dressing daily with sterile 4 x 4 inch gauze dressing and apply TED hose.  You may clean the incision with alcohol prior to redressing.   Do not put a pillow under the knee. Place it under the heel.         DISCHARGE MEDICATIONS:   Medication List  As of 02/16/2012 12:21 PM   STOP taking these medications         aspirin EC 81 MG tablet      FISH OIL BURP-LESS 1200 MG Caps         TAKE these medications         amLODipine 5 MG tablet   Commonly known as: NORVASC   Take 5 mg by mouth daily.      CALCIUM 1000 +  D PO   Take 1 tablet by mouth daily.      cephALEXin 500 MG capsule   Commonly known as: KEFLEX   Take 1 capsule (500 mg total) by mouth 4 (four) times daily.      cholecalciferol 1000 UNITS tablet   Commonly known as: VITAMIN D   Take 1,000 Units by mouth daily.      enoxaparin 40 MG/0.4ML injection   Commonly known as: LOVENOX   Inject 0.4 mLs  (40 mg total) into the skin every 12 (twelve) hours.      furosemide 40 MG tablet   Commonly known as: LASIX   Take 40 mg by mouth daily as needed. Water gain      GLUCOSAMINE CHONDR COMPLEX PO   Take 2 capsules by mouth daily.      hydrochlorothiazide 25 MG tablet   Commonly known as: HYDRODIURIL   Take 25 mg by mouth daily.      losartan 100 MG tablet   Commonly known as: COZAAR   Take 100 mg by mouth daily.      meloxicam 15 MG tablet   Commonly known as: MOBIC   Take 15 mg by mouth daily.      methocarbamol 500 MG tablet   Commonly known as: ROBAXIN   Take 1-2 tablets (500-1,000 mg total) by mouth every 6 (six) hours as needed.      metoprolol tartrate 25 MG tablet   Commonly known as: LOPRESSOR   Take 12.5 mg by mouth 2 (two) times daily.      oxyCODONE 5 MG immediate release tablet   Commonly known as: Oxy IR/ROXICODONE   Take 1-2 tablets (5-10 mg total) by mouth every 4 (four) hours as needed.      oxyCODONE 10 MG 12 hr tablet   Commonly known as: OXYCONTIN   Take 1 tablet (10 mg total) by mouth every 12 (twelve) hours.      pantoprazole 40 MG tablet   Commonly known as: PROTONIX   Take 40 mg by mouth daily.      POTASSIUM GLUCONATE PO   Take 1 capsule by mouth daily.      ROYAL JELLY PO   Take 1 capsule by mouth daily.      triamcinolone cream 0.1 %   Commonly known as: KENALOG   Apply 1 application topically 2 (two) times daily. Alternates with lotion      triamcinolone lotion 0.1 %   Commonly known as: KENALOG   Apply 1 application topically 3 (three) times daily. Alternates with cream      VITAMIN B-12 CR PO   Take 1 tablet by mouth daily.      vitamin E 1000 UNIT capsule   Take 1,000 Units by mouth daily.            FOLLOW UP VISIT:   Follow-up Information    Follow up with Raymon Mutton, MD. Call on 02/24/2012. (call [place] for appt [phone] on 02/24/12)    Contact information:   [address] [place]  [address] [phone]          DISPOSITION:  Home    CONDITION:  Good   Anaalicia Reimann 02/16/2012, 12:21 PM

## 2012-02-16 NOTE — Progress Notes (Signed)
PT Progress Note:     02/16/12 1000  PT Visit Information  Last PT Received On 02/16/12  Assistance Needed +1  PT Time Calculation  PT Start Time 0725  PT Stop Time 0745  PT Time Calculation (min) 20 min  Subjective Data  Subjective "Im beginning to feel stronger"  Precautions  Precautions Knee  Restrictions  LLE Weight Bearing WBAT  Cognition  Overall Cognitive Status Appears within functional limits for tasks assessed/performed  Arousal/Alertness Awake/alert  Orientation Level Oriented X4 / Intact  Behavior During Session [facility] for tasks performed  Bed Mobility  Bed Mobility Supine to Sit;Sit to Supine (on ortho gym mat table)  Supine to Sit 5: Supervision;Other (comment) (ortho gym mat table)  Sit to Supine 5: Supervision;Other (comment) (ortho gym mat table)  Details for Bed Mobility Assistance Pt able to transition on/off flat mat table without physical (A).  Cues for technique to increase ease of transition.   Transfers  Transfers Sit to Stand;Stand to Sit  Sit to Stand 4: Min assist;3: Mod assist;With upper extremity assist;With armrests;From chair/3-in-1;Other (comment) (ortho gym mat tablwe)  Stand to Sit 4: Min guard;4: Min assist;With upper extremity assist;With armrests;To chair/3-in-1;Other (comment) (ortho gym mat table)  Details for Transfer Assistance Pt with good recall of safe technique.  Increased (A) to sit<>stand from lower surface.  Pt continues to have difficutly achieving full upright standing without physical (A) & smoothness of transition.  Advised pt to use pillows to prop up seated surfaces at home.   Ambulation/Gait  Ambulation/Gait Assistance 4: Min guard  Ambulation Distance (Feet) 40 Feet (10' + 30' )  Assistive device Rolling walker  Ambulation/Gait Assistance Details Cues for tall posture, increase step length, relax UE's, encouragement to decrease reliance of UE's on RW.   Gait Pattern Step-to pattern;Decreased stance time - left;Decreased  step length - right  Stairs Yes  Stairs Assistance 4: Min assist  Stairs Assistance Details (indicate cue type and reason) Pt able to recall sequencing of LE's but required cueing/(A) for positioning of RW.  Pt with improved smoothness of up/down stairs & seemed to be comfortable with performance.   Stair Management Technique No rails;Backwards;Step to pattern;With walker  Number of Stairs 2   Wheelchair Mobility  Wheelchair Mobility No  PT - End of Session  Equipment Utilized During Treatment Gait belt  Activity Tolerance Patient tolerated treatment well  Patient left Other (comment) (with OT)  PT - Assessment/Plan  Comments on Treatment Session Pt progressing with PT goals & mobility.  Pt reports feeling stronger today.  Improved performance with stairs today.  Continues to require at least Mod (A) to stand from lower surfaces, therefore advised pt that if surfaces at home are lower than to heighten seated surface with pillows.    PT Plan Discharge plan remains appropriate;Frequency remains appropriate  PT Frequency 7X/week  Follow Up Recommendations Home health PT;Supervision/Assistance - 24 hour  Equipment Recommended None recommended by PT  Acute Rehab PT Goals  PT Goal: Supine/Side to Sit - Progress Progressing toward goal  PT Goal: Sit to Supine/Side - Progress Progressing toward goal  PT Goal: Sit to Stand - Progress Progressing toward goal  PT Goal: Ambulate - Progress Progressing toward goal  PT Goal: Up/Down Stairs - Progress Progressing toward goal      Verdell Face, PTA [phone] 02/16/2012

## 2012-04-03 DIAGNOSIS — L719 Rosacea, unspecified: Secondary | ICD-10-CM | POA: Insufficient documentation

## 2012-06-29 ENCOUNTER — Other Ambulatory Visit: Payer: Self-pay | Admitting: Orthopedic Surgery

## 2012-07-10 ENCOUNTER — Encounter (HOSPITAL_COMMUNITY): Payer: Self-pay

## 2012-07-10 ENCOUNTER — Encounter (HOSPITAL_COMMUNITY)
Admission: RE | Admit: 2012-07-10 | Discharge: 2012-07-10 | Disposition: A | Discharge status: Home / Self Care | Payer: BC Managed Care – PPO | Source: Ambulatory Visit | Attending: Orthopedic Surgery | Admitting: Orthopedic Surgery

## 2012-07-10 LAB — CBC WITH DIFFERENTIAL/PLATELET
Basophils Absolute: 0 10*3/uL (ref 0.0–0.1)
Basophils Relative: 1 % (ref 0–1)
Eosinophils Absolute: 0.2 10*3/uL (ref 0.0–0.7)
Eosinophils Relative: 2 % (ref 0–5)
HCT: 35.9 % — ABNORMAL LOW (ref 36.0–46.0)
Hemoglobin: 11.2 g/dL — ABNORMAL LOW (ref 12.0–15.0)
Lymphocytes Relative: 32 % (ref 12–46)
Lymphs Abs: 2.6 10*3/uL (ref 0.7–4.0)
MCH: 27.3 pg (ref 26.0–34.0)
MCHC: 31.2 g/dL (ref 30.0–36.0)
MCV: 87.3 fL (ref 78.0–100.0)
Monocytes Absolute: 0.6 10*3/uL (ref 0.1–1.0)
Monocytes Relative: 7 % (ref 3–12)
Neutro Abs: 4.8 10*3/uL (ref 1.7–7.7)
Neutrophils Relative %: 58 % (ref 43–77)
Platelets: 229 10*3/uL (ref 150–400)
RBC: 4.11 MIL/uL (ref 3.87–5.11)
RDW: 15.1 % (ref 11.5–15.5)
WBC: 8.3 10*3/uL (ref 4.0–10.5)

## 2012-07-10 LAB — URINALYSIS, ROUTINE W REFLEX MICROSCOPIC
Bilirubin Urine: NEGATIVE
Glucose, UA: NEGATIVE mg/dL
Hgb urine dipstick: NEGATIVE
Ketones, ur: NEGATIVE mg/dL
Leukocytes, UA: NEGATIVE
Nitrite: NEGATIVE
Protein, ur: NEGATIVE mg/dL
Specific Gravity, Urine: 1.024 (ref 1.005–1.030)
Urobilinogen, UA: 0.2 mg/dL (ref 0.0–1.0)
pH: 5.5 (ref 5.0–8.0)

## 2012-07-10 LAB — COMPREHENSIVE METABOLIC PANEL
ALT: 10 U/L (ref 0–35)
AST: 11 U/L (ref 0–37)
Albumin: 3.7 g/dL (ref 3.5–5.2)
Alkaline Phosphatase: 69 U/L (ref 39–117)
BUN: 22 mg/dL (ref 6–23)
CO2: 29 mEq/L (ref 19–32)
Calcium: 9.7 mg/dL (ref 8.4–10.5)
Chloride: 103 mEq/L (ref 96–112)
Creatinine, Ser: 0.9 mg/dL (ref 0.50–1.10)
GFR calc Af Amer: 73 mL/min — ABNORMAL LOW (ref >90)
GFR calc non Af Amer: 63 mL/min — ABNORMAL LOW (ref >90)
Glucose, Bld: 98 mg/dL (ref 70–99)
Potassium: 4.5 mEq/L (ref 3.5–5.1)
Sodium: 139 mEq/L (ref 135–145)
Total Bilirubin: 0.8 mg/dL (ref 0.3–1.2)
Total Protein: 7.1 g/dL (ref 6.0–8.3)

## 2012-07-10 LAB — SURGICAL PCR SCREEN
MRSA, PCR: NEGATIVE
Staphylococcus aureus: NEGATIVE

## 2012-07-10 NOTE — Pre-Procedure Instructions (Signed)
20 [name]  07/10/2012   Your procedure is scheduled on:  07-16-2012  Report to [facility] Stay [facility] at 8:00 AM.  Call this number if you have problems the morning of surgery: [phone]   Remember:   Do not eat food or drink:After Midnight.     Take these medicines the morning of surgery with A SIP OF WATER:amlodipine,lovenox, pain medication as needed,protonix,metoprolol   Do not wear jewelry, make-up or nail polish.  Do not wear lotions, powders, or perfumes. You may wear deodorant.  Do not shave 48 hours prior to surgery. Men may shave face and neck.  Do not bring valuables to the hospital.  Contacts, dentures or bridgework may not be worn into surgery.  Leave suitcase in the car. After surgery it may be brought to your room.    For patients admitted to the hospital, checkout time is 11:00 AM the day of discharge.   Patients discharged the day of surgery will not be allowed to drive home.  Name and phone number of your driver:     Special Instructions: Incentive Spirometry - Practice and bring it with you on the day of surgery. Shower using CHG 2 nights before surgery and the night before surgery.  If you shower the day of surgery use CHG.  Use special wash - you have one bottle of CHG for all showers.  You should use approximately 1/3 of the bottle for each shower.   Please read over the following fact sheets that you were given: Pain Booklet, Coughing and Deep Breathing, MRSA Information and Surgical Site Infection Prevention

## 2012-07-11 ENCOUNTER — Other Ambulatory Visit (HOSPITAL_COMMUNITY): Payer: BC Managed Care – PPO

## 2012-07-11 LAB — URINE CULTURE: Colony Count: 9000

## 2012-07-15 MED ORDER — CHLORHEXIDINE GLUCONATE 4 % EX LIQD: 60.0000 mL | Freq: Once | CUTANEOUS | Status: DC

## 2012-07-15 MED ORDER — ACETAMINOPHEN 10 MG/ML IV SOLN
1000.0000 mg | Freq: Four times a day (QID) | INTRAVENOUS | Status: DC
  Administered 2012-07-16: 1000 mg via INTRAVENOUS
  Filled 2012-07-15 (×3): qty 100

## 2012-07-15 MED ORDER — DEXTROSE 5 % IV SOLN
3.0000 g | INTRAVENOUS | Status: AC
  Administered 2012-07-16: 3 g via INTRAVENOUS
  Filled 2012-07-15: qty 3000

## 2012-07-15 MED ORDER — SODIUM CHLORIDE 0.9 % IV SOLN: INTRAVENOUS | Status: DC

## 2012-07-16 ENCOUNTER — Observation Stay (HOSPITAL_COMMUNITY)
Admission: RE | Admit: 2012-07-16 | Discharge: 2012-07-17 | DRG: 226 | Disposition: A | Discharge status: Home / Self Care | Payer: BC Managed Care – PPO | Source: Ambulatory Visit | Attending: Orthopedic Surgery | Admitting: Orthopedic Surgery

## 2012-07-16 ENCOUNTER — Encounter (HOSPITAL_COMMUNITY): Payer: Self-pay | Admitting: *Deleted

## 2012-07-16 ENCOUNTER — Encounter (HOSPITAL_COMMUNITY)
Admission: RE | Disposition: A | Discharge status: Home / Self Care | Payer: Self-pay | Source: Ambulatory Visit | Attending: Orthopedic Surgery

## 2012-07-16 ENCOUNTER — Ambulatory Visit (HOSPITAL_COMMUNITY): Payer: BC Managed Care – PPO | Admitting: Anesthesiology

## 2012-07-16 ENCOUNTER — Encounter (HOSPITAL_COMMUNITY): Payer: Self-pay | Admitting: Anesthesiology

## 2012-07-16 DIAGNOSIS — Z01812 Encounter for preprocedural laboratory examination: Secondary | ICD-10-CM | POA: Insufficient documentation

## 2012-07-16 DIAGNOSIS — I10 Essential (primary) hypertension: Secondary | ICD-10-CM | POA: Insufficient documentation

## 2012-07-16 DIAGNOSIS — M66259 Spontaneous rupture of extensor tendons, unspecified thigh: Principal | ICD-10-CM | POA: Insufficient documentation

## 2012-07-16 DIAGNOSIS — M25569 Pain in unspecified knee: Secondary | ICD-10-CM

## 2012-07-16 DIAGNOSIS — Z96659 Presence of unspecified artificial knee joint: Secondary | ICD-10-CM | POA: Insufficient documentation

## 2012-07-16 HISTORY — PX: QUADRICEPS TENDON REPAIR: SHX756

## 2012-07-16 SURGERY — REPAIR, TENDON, QUADRICEPS
Anesthesia: Regional | Site: Knee | Laterality: Left | Wound class: Clean

## 2012-07-16 MED ORDER — SODIUM CHLORIDE 0.9 % IR SOLN
Status: DC | PRN
  Administered 2012-07-16: 3000 mL

## 2012-07-16 MED ORDER — BISACODYL 5 MG PO TBEC: 5.0000 mg | DELAYED_RELEASE_TABLET | Freq: Every day | ORAL | Status: DC | PRN

## 2012-07-16 MED ORDER — BUPIVACAINE-EPINEPHRINE 0.25% -1:200000 IJ SOLN: INTRAMUSCULAR | Status: DC | PRN

## 2012-07-16 MED ORDER — LACTATED RINGERS IV SOLN
INTRAVENOUS | Status: DC | PRN
  Administered 2012-07-16: 10:00:00 via INTRAVENOUS

## 2012-07-16 MED ORDER — ONDANSETRON HCL 4 MG/2ML IJ SOLN: 4.0000 mg | Freq: Four times a day (QID) | INTRAMUSCULAR | Status: DC | PRN

## 2012-07-16 MED ORDER — FENTANYL CITRATE 0.05 MG/ML IJ SOLN
INTRAMUSCULAR | Status: AC
  Filled 2012-07-16: qty 2

## 2012-07-16 MED ORDER — METOCLOPRAMIDE HCL 5 MG/ML IJ SOLN: 5.0000 mg | Freq: Three times a day (TID) | INTRAMUSCULAR | Status: DC | PRN

## 2012-07-16 MED ORDER — BUPIVACAINE-EPINEPHRINE PF 0.25-1:200000 % IJ SOLN
INTRAMUSCULAR | Status: AC
  Filled 2012-07-16: qty 30

## 2012-07-16 MED ORDER — OXYCODONE HCL 5 MG/5ML PO SOLN: 5.0000 mg | Freq: Once | ORAL | Status: DC | PRN

## 2012-07-16 MED ORDER — SCOPOLAMINE 1 MG/3DAYS TD PT72
MEDICATED_PATCH | TRANSDERMAL | Status: AC
  Filled 2012-07-16: qty 1

## 2012-07-16 MED ORDER — MIDAZOLAM HCL 5 MG/5ML IJ SOLN
INTRAMUSCULAR | Status: DC | PRN
  Administered 2012-07-16: 2 mg via INTRAVENOUS

## 2012-07-16 MED ORDER — ALUM & MAG HYDROXIDE-SIMETH 200-200-20 MG/5ML PO SUSP: 30.0000 mL | ORAL | Status: DC | PRN

## 2012-07-16 MED ORDER — METHOCARBAMOL 100 MG/ML IJ SOLN
500.0000 mg | Freq: Four times a day (QID) | INTRAVENOUS | Status: DC | PRN
  Filled 2012-07-16: qty 5

## 2012-07-16 MED ORDER — HYDROMORPHONE HCL PF 1 MG/ML IJ SOLN: 0.2500 mg | INTRAMUSCULAR | Status: DC | PRN

## 2012-07-16 MED ORDER — FUROSEMIDE 40 MG PO TABS
40.0000 mg | ORAL_TABLET | Freq: Every day | ORAL | Status: DC | PRN
  Filled 2012-07-16: qty 1

## 2012-07-16 MED ORDER — MIDAZOLAM HCL 2 MG/2ML IJ SOLN
1.0000 mg | INTRAMUSCULAR | Status: DC | PRN
  Administered 2012-07-16: 1 mg via INTRAVENOUS

## 2012-07-16 MED ORDER — AMLODIPINE BESYLATE 5 MG PO TABS
5.0000 mg | ORAL_TABLET | Freq: Every day | ORAL | Status: DC
  Administered 2012-07-16 – 2012-07-17 (×2): 5 mg via ORAL
  Filled 2012-07-16 (×2): qty 1

## 2012-07-16 MED ORDER — HYDROCHLOROTHIAZIDE 25 MG PO TABS
25.0000 mg | ORAL_TABLET | Freq: Every day | ORAL | Status: DC
  Administered 2012-07-16 – 2012-07-17 (×2): 25 mg via ORAL
  Filled 2012-07-16 (×2): qty 1

## 2012-07-16 MED ORDER — PANTOPRAZOLE SODIUM 40 MG PO TBEC
40.0000 mg | DELAYED_RELEASE_TABLET | Freq: Every day | ORAL | Status: DC
  Administered 2012-07-16 – 2012-07-17 (×2): 40 mg via ORAL
  Filled 2012-07-16 (×2): qty 1

## 2012-07-16 MED ORDER — MIDAZOLAM HCL 2 MG/2ML IJ SOLN
INTRAMUSCULAR | Status: AC
  Filled 2012-07-16: qty 2

## 2012-07-16 MED ORDER — LOSARTAN POTASSIUM 50 MG PO TABS
100.0000 mg | ORAL_TABLET | Freq: Every day | ORAL | Status: DC
  Administered 2012-07-16 – 2012-07-17 (×2): 100 mg via ORAL
  Filled 2012-07-16 (×2): qty 2

## 2012-07-16 MED ORDER — BUPIVACAINE-EPINEPHRINE (PF) 0.5% -1:200000 IJ SOLN
INTRAMUSCULAR | Status: AC
  Filled 2012-07-16: qty 10

## 2012-07-16 MED ORDER — METOPROLOL TARTRATE 25 MG PO TABS
25.0000 mg | ORAL_TABLET | Freq: Two times a day (BID) | ORAL | Status: DC
  Administered 2012-07-16 – 2012-07-17 (×2): 25 mg via ORAL
  Filled 2012-07-16 (×3): qty 1

## 2012-07-16 MED ORDER — FLEET ENEMA 7-19 GM/118ML RE ENEM: 1.0000 | ENEMA | Freq: Once | RECTAL | Status: AC | PRN

## 2012-07-16 MED ORDER — CEFAZOLIN SODIUM-DEXTROSE 2-3 GM-% IV SOLR
2.0000 g | Freq: Four times a day (QID) | INTRAVENOUS | Status: AC
  Administered 2012-07-16 (×2): 2 g via INTRAVENOUS
  Filled 2012-07-16 (×3): qty 50

## 2012-07-16 MED ORDER — ZOLPIDEM TARTRATE 5 MG PO TABS: 5.0000 mg | ORAL_TABLET | Freq: Every evening | ORAL | Status: DC | PRN

## 2012-07-16 MED ORDER — ONDANSETRON HCL 4 MG/2ML IJ SOLN
INTRAMUSCULAR | Status: DC | PRN
  Administered 2012-07-16: 4 mg via INTRAVENOUS

## 2012-07-16 MED ORDER — METHOCARBAMOL 500 MG PO TABS
500.0000 mg | ORAL_TABLET | Freq: Four times a day (QID) | ORAL | Status: DC | PRN
  Administered 2012-07-17 (×2): 500 mg via ORAL
  Filled 2012-07-16 (×2): qty 1

## 2012-07-16 MED ORDER — SODIUM CHLORIDE 0.9 % IR SOLN
Status: DC | PRN
  Administered 2012-07-16: 1000 mL

## 2012-07-16 MED ORDER — BUPIVACAINE-EPINEPHRINE 0.5% -1:200000 IJ SOLN
INTRAMUSCULAR | Status: DC | PRN
  Administered 2012-07-16: 16 mL
  Administered 2012-07-16: 20 mL

## 2012-07-16 MED ORDER — OXYCODONE HCL 5 MG PO TABS: 5.0000 mg | ORAL_TABLET | Freq: Once | ORAL | Status: DC | PRN

## 2012-07-16 MED ORDER — HYDROMORPHONE HCL PF 1 MG/ML IJ SOLN: 1.0000 mg | INTRAMUSCULAR | Status: DC | PRN

## 2012-07-16 MED ORDER — ASPIRIN EC 325 MG PO TBEC
325.0000 mg | DELAYED_RELEASE_TABLET | Freq: Every day | ORAL | Status: DC
Start: 2012-07-17 — End: 2012-07-17
  Administered 2012-07-17: 325 mg via ORAL
  Filled 2012-07-16 (×2): qty 1

## 2012-07-16 MED ORDER — METOCLOPRAMIDE HCL 10 MG PO TABS: 5.0000 mg | ORAL_TABLET | Freq: Three times a day (TID) | ORAL | Status: DC | PRN

## 2012-07-16 MED ORDER — SENNOSIDES-DOCUSATE SODIUM 8.6-50 MG PO TABS: 1.0000 | ORAL_TABLET | Freq: Every evening | ORAL | Status: DC | PRN

## 2012-07-16 MED ORDER — OXYCODONE HCL 5 MG PO TABS
5.0000 mg | ORAL_TABLET | ORAL | Status: DC | PRN
  Administered 2012-07-16 – 2012-07-17 (×5): 5 mg via ORAL
  Filled 2012-07-16 (×5): qty 1

## 2012-07-16 MED ORDER — LIDOCAINE HCL (CARDIAC) 20 MG/ML IV SOLN
INTRAVENOUS | Status: DC | PRN
  Administered 2012-07-16: 80 mg via INTRAVENOUS

## 2012-07-16 MED ORDER — SODIUM CHLORIDE 0.9 % IV SOLN
INTRAVENOUS | Status: DC
  Administered 2012-07-16 – 2012-07-17 (×2): via INTRAVENOUS

## 2012-07-16 MED ORDER — LACTATED RINGERS IV SOLN
INTRAVENOUS | Status: DC
  Administered 2012-07-16: 10:00:00 via INTRAVENOUS

## 2012-07-16 MED ORDER — DIPHENHYDRAMINE HCL 12.5 MG/5ML PO ELIX: 12.5000 mg | ORAL_SOLUTION | ORAL | Status: DC | PRN

## 2012-07-16 MED ORDER — ACETAMINOPHEN 325 MG PO TABS: 650.0000 mg | ORAL_TABLET | Freq: Four times a day (QID) | ORAL | Status: DC | PRN

## 2012-07-16 MED ORDER — FENTANYL CITRATE 0.05 MG/ML IJ SOLN
50.0000 ug | INTRAMUSCULAR | Status: DC | PRN
  Administered 2012-07-16: 50 ug via INTRAVENOUS

## 2012-07-16 MED ORDER — IBUPROFEN 600 MG PO TABS
600.0000 mg | ORAL_TABLET | Freq: Four times a day (QID) | ORAL | Status: DC | PRN
  Filled 2012-07-16: qty 1

## 2012-07-16 MED ORDER — ACETAMINOPHEN 650 MG RE SUPP: 650.0000 mg | Freq: Four times a day (QID) | RECTAL | Status: DC | PRN

## 2012-07-16 MED ORDER — FENTANYL CITRATE 0.05 MG/ML IJ SOLN
INTRAMUSCULAR | Status: DC | PRN
  Administered 2012-07-16: 50 ug via INTRAVENOUS

## 2012-07-16 MED ORDER — ONDANSETRON HCL 4 MG PO TABS: 4.0000 mg | ORAL_TABLET | Freq: Four times a day (QID) | ORAL | Status: DC | PRN

## 2012-07-16 MED ORDER — BUPIVACAINE-EPINEPHRINE PF 0.5-1:200000 % IJ SOLN
INTRAMUSCULAR | Status: DC | PRN
  Administered 2012-07-16: 30 mL

## 2012-07-16 MED ORDER — MENTHOL 3 MG MT LOZG: 1.0000 | LOZENGE | OROMUCOSAL | Status: DC | PRN

## 2012-07-16 MED ORDER — PHENOL 1.4 % MT LIQD: 1.0000 | OROMUCOSAL | Status: DC | PRN

## 2012-07-16 MED ORDER — PROPOFOL 10 MG/ML IV BOLUS
INTRAVENOUS | Status: DC | PRN
  Administered 2012-07-16: 200 mg via INTRAVENOUS

## 2012-07-16 MED ORDER — ACETAMINOPHEN 10 MG/ML IV SOLN
1000.0000 mg | Freq: Four times a day (QID) | INTRAVENOUS | Status: AC
  Administered 2012-07-16 – 2012-07-17 (×4): 1000 mg via INTRAVENOUS
  Filled 2012-07-16 (×4): qty 100

## 2012-07-16 MED ORDER — DOCUSATE SODIUM 100 MG PO CAPS
100.0000 mg | ORAL_CAPSULE | Freq: Two times a day (BID) | ORAL | Status: DC
  Administered 2012-07-16 – 2012-07-17 (×2): 100 mg via ORAL
  Filled 2012-07-16 (×3): qty 1

## 2012-07-16 MED ORDER — SCOPOLAMINE 1 MG/3DAYS TD PT72
1.0000 | MEDICATED_PATCH | TRANSDERMAL | Status: DC
  Administered 2012-07-16: 1.5 mg via TRANSDERMAL

## 2012-07-16 SURGICAL SUPPLY — 59 items
BANDAGE ELASTIC 6 VELCRO ST LF (GAUZE/BANDAGES/DRESSINGS) IMPLANT
BANDAGE ESMARK 6X9 LF (GAUZE/BANDAGES/DRESSINGS) IMPLANT
BLADE SURG 10 STRL SS (BLADE) ×6 IMPLANT
BNDG COHESIVE 6X5 TAN STRL LF (GAUZE/BANDAGES/DRESSINGS) ×2 IMPLANT
BNDG ESMARK 6X9 LF (GAUZE/BANDAGES/DRESSINGS)
CLOTH BEACON ORANGE TIMEOUT ST (SAFETY) ×2 IMPLANT
COVER MAYO STAND STRL (DRAPES) ×2 IMPLANT
CUFF TOURNIQUET SINGLE 34IN LL (TOURNIQUET CUFF) IMPLANT
CUFF TOURNIQUET SINGLE 44IN (TOURNIQUET CUFF) ×2 IMPLANT
DRAPE INCISE IOBAN 66X45 STRL (DRAPES) ×6 IMPLANT
DRAPE PROXIMA HALF (DRAPES) ×2 IMPLANT
DRAPE U-SHAPE 47X51 STRL (DRAPES) ×2 IMPLANT
DRSG ADAPTIC 3X8 NADH LF (GAUZE/BANDAGES/DRESSINGS) ×2 IMPLANT
DRSG PAD ABDOMINAL 8X10 ST (GAUZE/BANDAGES/DRESSINGS) ×2 IMPLANT
ELECT CAUTERY BLADE 6.4 (BLADE) IMPLANT
ELECT REM PT RETURN 9FT ADLT (ELECTROSURGICAL)
ELECTRODE REM PT RTRN 9FT ADLT (ELECTROSURGICAL) IMPLANT
GAUZE XEROFORM 1X8 LF (GAUZE/BANDAGES/DRESSINGS) IMPLANT
GLOVE BIOGEL PI IND STRL 7.5 (GLOVE) IMPLANT
GLOVE BIOGEL PI IND STRL 8.5 (GLOVE) ×2 IMPLANT
GLOVE BIOGEL PI INDICATOR 7.5 (GLOVE)
GLOVE BIOGEL PI INDICATOR 8.5 (GLOVE) ×2
GLOVE SS N UNI LF 7.5 STRL (GLOVE) ×4 IMPLANT
GLOVE SURG ORTHO 7.0 STRL STRW (GLOVE) IMPLANT
GLOVE SURG ORTHO 8.0 STRL STRW (GLOVE) ×4 IMPLANT
GOWN STRL NON-REIN LRG LVL3 (GOWN DISPOSABLE) ×6 IMPLANT
HANDPIECE INTERPULSE COAX TIP (DISPOSABLE) ×1
IMMOBILIZER KNEE 22  40 CIR (ORTHOPEDIC SUPPLIES) ×1
IMMOBILIZER KNEE 22 40 CIR (ORTHOPEDIC SUPPLIES) ×1 IMPLANT
KIT ROOM TURNOVER OR (KITS) ×2 IMPLANT
MANIFOLD NEPTUNE II (INSTRUMENTS) ×2 IMPLANT
NEEDLE HYPO 25GX1X1/2 BEV (NEEDLE) ×2 IMPLANT
PACK ORTHO EXTREMITY (CUSTOM PROCEDURE TRAY) ×2 IMPLANT
PAD ARMBOARD 7.5X6 YLW CONV (MISCELLANEOUS) ×4 IMPLANT
PADDING CAST COTTON 6X4 STRL (CAST SUPPLIES) ×2 IMPLANT
PASSER SUT SWANSON 36MM LOOP (INSTRUMENTS) IMPLANT
PENCIL BUTTON HOLSTER BLD 10FT (ELECTRODE) IMPLANT
SET HNDPC FAN SPRY TIP SCT (DISPOSABLE) ×1 IMPLANT
SPONGE GAUZE 4X4 12PLY (GAUZE/BANDAGES/DRESSINGS) ×2 IMPLANT
SPONGE LAP 18X18 X RAY DECT (DISPOSABLE) ×4 IMPLANT
SPONGE LAP 4X18 X RAY DECT (DISPOSABLE) ×2 IMPLANT
STAPLER VISISTAT 35W (STAPLE) ×2 IMPLANT
STOCKINETTE 6  STRL (DRAPES) ×1
STOCKINETTE 6 STRL (DRAPES) ×1 IMPLANT
SUT FIBERWIRE #2 38 T-5 BLUE (SUTURE) ×4
SUT MNCRL AB 3-0 PS2 18 (SUTURE) IMPLANT
SUT VIC AB 0 CT1 27 (SUTURE) ×2
SUT VIC AB 0 CT1 27XBRD ANBCTR (SUTURE) ×2 IMPLANT
SUT VIC AB 1 CT1 27 (SUTURE) ×2
SUT VIC AB 1 CT1 27XBRD ANBCTR (SUTURE) ×2 IMPLANT
SUT VIC AB 2-0 CT1 27 (SUTURE) ×2
SUT VIC AB 2-0 CT1 TAPERPNT 27 (SUTURE) ×2 IMPLANT
SUTURE FIBERWR #2 38 T-5 BLUE (SUTURE) ×2 IMPLANT
SYR 20CC LL (SYRINGE) ×2 IMPLANT
TOWEL OR 17X24 6PK STRL BLUE (TOWEL DISPOSABLE) ×2 IMPLANT
TOWEL OR 17X26 10 PK STRL BLUE (TOWEL DISPOSABLE) ×2 IMPLANT
TUBE CONNECTING 12X1/4 (SUCTIONS) ×2 IMPLANT
WATER STERILE IRR 1000ML POUR (IV SOLUTION) ×2 IMPLANT
YANKAUER SUCT BULB TIP NO VENT (SUCTIONS) ×4 IMPLANT

## 2012-07-16 NOTE — Anesthesia Postprocedure Evaluation (Signed)
Anesthesia Post Note  Patient: [name]  Procedure(s) Performed: Procedure(s) (LRB): REPAIR QUADRICEP TENDON (Left)  Anesthesia type: General  Patient location: PACU  Post pain: Pain level controlled and Adequate analgesia  Post assessment: Post-op Vital signs reviewed, Patient's Cardiovascular Status Stable, Respiratory Function Stable, Patent Airway and Pain level controlled  Last Vitals:  Filed Vitals:   07/16/12 1240  BP: 144/42  Pulse: 70  Temp:   Resp: 19    Post vital signs: Reviewed and stable  Level of consciousness: awake, alert  and oriented  Complications: No apparent anesthesia complications

## 2012-07-16 NOTE — Anesthesia Preprocedure Evaluation (Addendum)
Anesthesia Evaluation  Patient identified by MRN, date of birth, ID band Patient awake    Reviewed: Allergy & Precautions, H&P , NPO status , Patient's Chart, lab work & pertinent test results  History of Anesthesia Complications (+) PONV  Airway Mallampati: II TM Distance: >3 FB Neck ROM: full    Dental  (+) Dental Advisory Given   Pulmonary shortness of breath and with exertion, sleep apnea ,          Cardiovascular hypertension, Pt. on medications and Pt. on home beta blockers     Neuro/Psych    GI/Hepatic GERD-  Medicated and Controlled,  Endo/Other  Morbid obesity  Renal/GU      Musculoskeletal   Abdominal   Peds  Hematology   Anesthesia Other Findings   Reproductive/Obstetrics                          Anesthesia Physical Anesthesia Plan  ASA: III  Anesthesia Plan: General and Regional   Post-op Pain Management: MAC Combined w/ Regional for Post-op pain   Induction: Intravenous  Airway Management Planned: LMA  Additional Equipment:   Intra-op Plan:   Post-operative Plan:   Informed Consent: I have reviewed the patients History and Physical, chart, labs and discussed the procedure including the risks, benefits and alternatives for the proposed anesthesia with the patient or authorized representative who has indicated his/her understanding and acceptance.   Dental advisory given  Plan Discussed with: CRNA, Surgeon and Anesthesiologist  Anesthesia Plan Comments:        Anesthesia Quick Evaluation

## 2012-07-16 NOTE — Preoperative (Signed)
Beta Blockers   Reason not to administer Beta Blockers:Not Applicable 

## 2012-07-16 NOTE — Anesthesia Procedure Notes (Addendum)
Anesthesia Regional Block:  Femoral nerve block  Pre-Anesthetic Checklist: ,, timeout performed, Correct Patient, Correct Site, Correct Laterality, Correct Procedure,, site marked, risks and benefits discussed, Surgical consent,  Pre-op evaluation,  At surgeon's request and post-op pain management  Laterality: Left  Prep: chloraprep       Needles:  Injection technique: Single-shot  Needle Type: Echogenic Stimulator Needle     Needle Length: 9cm  Needle Gauge: 21    Additional Needles:  Procedures: nerve stimulator Femoral nerve block  Nerve Stimulator or Paresthesia:  Response: Quadriceps muscle contraction, 0.45 mA,   Additional Responses:   Narrative:  Start time: 07/16/2012 10:08 AM End time: 07/16/2012 10:19 AM Injection made incrementally with aspirations every 5 mL.  Performed by: Personally  Anesthesiologist: Dr Chaney Malling  Additional Notes: Functioning IV was confirmed and monitors were applied.  A 90mm 21ga Arrow echogenic stimulator needle was used. Sterile prep and drape,hand hygiene and sterile gloves were used.  Negative aspiration and negative test dose prior to incremental administration of local anesthetic. The patient tolerated the procedure well.    Femoral nerve block Procedure Name: LMA Insertion Date/Time: 07/16/2012 10:56 AM Performed by: Rogelia Boga Pre-anesthesia Checklist: Patient identified, Emergency Drugs available, Suction available, Patient being monitored and Timeout performed Patient Re-evaluated:Patient Re-evaluated prior to inductionOxygen Delivery Method: Circle system utilized Preoxygenation: Pre-oxygenation with 100% oxygen Intubation Type: IV induction LMA: LMA inserted LMA Size: 4.0 Tube type: Oral Number of attempts: 1 Placement Confirmation: positive ETCO2 and breath sounds checked- equal and bilateral Tube secured with: Tape Dental Injury: Teeth and Oropharynx as per pre-operative assessment

## 2012-07-16 NOTE — H&P (Signed)
  [name] MRN:  [id number] DOB/SEX:  08-15-1941/female  CHIEF COMPLAINT:  Painful left Knee  HISTORY: Patient is a 71 y.o. female presented with a history of pain in the left knee. Onset of symptoms was abrupt starting a few weeks ago with gradually worsening course since that time.  Prior procedures on the knee include arthroplasty. Patient has been treated conservatively with over-the-counter NSAIDs and activity modification. Patient currently rates pain in the knee at 7 out of 10 with activity. There is no pain at night.  PAST MEDICAL HISTORY: There are no active problems to display for this patient.  Past Medical History  Diagnosis Date  . PONV (postoperative nausea and vomiting)   . Hypertension   . GERD (gastroesophageal reflux disease)   . Arthritis   . Shortness of breath     WITH EXERTION   . Sleep apnea     NO MACHINE    Past Surgical History  Procedure Date  . Tubal ligation     1974  . Abdominal hysterectomy     1983  . Cholecystectomy     1990  . Ankle surgery     LEFT 1993  . Hand surgery     2011 CYST     . 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 Breast surgery     LEFT SIDE BIOPSY 1989     MEDICATIONS:   No prescriptions prior to admission    ALLERGIES:  No Known Allergies  REVIEW OF SYSTEMS:  Pertinent items are noted in HPI.   FAMILY HISTORY:  No family history on file.  SOCIAL HISTORY:   History  Substance Use Topics  . Smoking status: Never Smoker   . Smokeless tobacco: Not on file  . Alcohol Use: Yes     RARELY      EXAMINATION:  Vital signs in last 24 hours:    General appearance: alert, cooperative and no distress Lungs: clear to auscultation bilaterally Heart: regular rate and rhythm, S1, S2 normal, no murmur, click, rub or gallop Abdomen: soft, non-tender; bowel sounds normal; no masses,  no  organomegaly Extremities: extremities normal, atraumatic, no cyanosis or edema and Homans sign is negative, no sign of DVT Pulses: 2+ and symmetric Skin: Skin color, texture, turgor normal. No rashes or lesions Neurologic: Alert and oriented X 3, normal strength and tone. Normal symmetric reflexes. Normal coordination and gait  Musculoskeletal:  ROM 0-90, Ligaments intact,  Imaging Review Plain radiographs demonstrate intact total knee components of the left knee. The overall alignment is neutral. The bone quality appears to be good for age and reported activity level.  Assessment/Plan  left knee pain and decreased ROM after TKA  Left total knee VMO repair  Trevor Duty 07/16/2012, 7:21 AM

## 2012-07-16 NOTE — Transfer of Care (Signed)
Immediate Anesthesia Transfer of Care Note  Patient: [name]  Procedure(s) Performed: Procedure(s) (LRB) with comments: REPAIR QUADRICEP TENDON (Left) - open repair of VMO   Patient Location: PACU  Anesthesia Type: General  Level of Consciousness: awake, alert , oriented and patient cooperative  Airway & Oxygen Therapy: Patient Spontanous Breathing and Patient connected to nasal cannula oxygen  Post-op Assessment: Report given to PACU RN, Post -op Vital signs reviewed and stable and Patient moving all extremities  Post vital signs: Reviewed and stable  Complications: No apparent anesthesia complications

## 2012-07-17 ENCOUNTER — Encounter (HOSPITAL_COMMUNITY): Payer: Self-pay | Admitting: General Practice

## 2012-07-17 LAB — BASIC METABOLIC PANEL
BUN: 16 mg/dL (ref 6–23)
CO2: 28 mEq/L (ref 19–32)
Calcium: 9.1 mg/dL (ref 8.4–10.5)
Chloride: 103 mEq/L (ref 96–112)
Creatinine, Ser: 0.92 mg/dL (ref 0.50–1.10)
GFR calc Af Amer: 71 mL/min — ABNORMAL LOW (ref >90)
GFR calc non Af Amer: 61 mL/min — ABNORMAL LOW (ref >90)
Glucose, Bld: 107 mg/dL — ABNORMAL HIGH (ref 70–99)
Potassium: 4 mEq/L (ref 3.5–5.1)
Sodium: 140 mEq/L (ref 135–145)

## 2012-07-17 LAB — CBC
HCT: 33.9 % — ABNORMAL LOW (ref 36.0–46.0)
Hemoglobin: 10.6 g/dL — ABNORMAL LOW (ref 12.0–15.0)
MCH: 28 pg (ref 26.0–34.0)
MCHC: 31.3 g/dL (ref 30.0–36.0)
MCV: 89.7 fL (ref 78.0–100.0)
Platelets: 229 10*3/uL (ref 150–400)
RBC: 3.78 MIL/uL — ABNORMAL LOW (ref 3.87–5.11)
RDW: 15.1 % (ref 11.5–15.5)
WBC: 8.9 10*3/uL (ref 4.0–10.5)

## 2012-07-17 MED ORDER — OXYCODONE HCL 5 MG PO TABS: 5.0000 mg | ORAL_TABLET | ORAL | Status: DC | PRN

## 2012-07-17 MED ORDER — ASPIRIN 325 MG PO TBEC: 325.0000 mg | DELAYED_RELEASE_TABLET | Freq: Every day | ORAL | Status: DC

## 2012-07-17 MED ORDER — METHOCARBAMOL 500 MG PO TABS: 500.0000 mg | ORAL_TABLET | Freq: Four times a day (QID) | ORAL | Status: DC | PRN

## 2012-07-17 NOTE — Discharge Summary (Signed)
Home Georgena Spurling, MD   Altamese Cabal, PA-C [address] [place], [place], [place]  [address]                             [phone]  PATIENT ID: [name]        MRN:  [id number]          DOB/AGE: June 10, 1941 / 71 y.o.    DISCHARGE SUMMARY  ADMISSION DATE:    07/16/2012 DISCHARGE DATE:   07/17/2012   ADMISSION DIAGNOSIS: patella dislocation s/p LT TKA    DISCHARGE DIAGNOSIS:  patella dislocation s/p LT TKA    ADDITIONAL DIAGNOSIS: Active Problems:  * No active hospital problems. *   Past Medical History  Diagnosis Date  . PONV (postoperative nausea and vomiting)   . Hypertension   . GERD (gastroesophageal reflux disease)   . Arthritis   . Shortness of breath     WITH EXERTION   . Sleep apnea     NO MACHINE     PROCEDURE: Procedure(s): REPAIR QUADRICEP TENDON on 07/16/2012  CONSULTS:     HISTORY:  See H&P in chart  HOSPITAL COURSE:  [name] is a 71 y.o. admitted on 07/16/2012 and found to have a diagnosis of patella dislocation s/p LT TKA.  After appropriate laboratory studies were obtained  they were taken to the operating room on 07/16/2012 and underwent Procedure(s): REPAIR QUADRICEP TENDON.   They were given perioperative antibiotics:  Anti-infectives     Start     Dose/Rate Route Frequency Ordered Stop   07/16/12 1700   ceFAZolin (ANCEF) IVPB 2 g/50 mL premix        2 g 100 mL/hr over 30 Minutes Intravenous Every 6 hours 07/16/12 1416 07/17/12 0025   07/15/12 1424   ceFAZolin (ANCEF) 3 g in dextrose 5 % 50 mL IVPB        3 g 160 mL/hr over 30 Minutes Intravenous 60 min pre-op 07/15/12 1424 07/16/12 1104        .  Tolerated the procedure well.  Placed with a foley intraoperatively.  Given Ofirmev at induction and for 48 hours.    POD #1, allowed out of bed to a chair.  PT for ambulation and exercise program.  Foley D/C'd in morning.  IV saline locked.  O2 discontionued.  .  The remainder of the hospital course was dedicated to ambulation  and strengthening.   The patient was discharged on 1 Day Post-Op in  Good condition.  Blood products given:none  DIAGNOSTIC STUDIES: Recent vital signs: Patient Vitals for the past 24 hrs:  BP Temp Pulse Resp SpO2  07/17/12 1457 113/49 mmHg 98.3 F (36.8 C) 66  20  97 %  07/17/12 0800 - - - 18  99 %  07/17/12 0709 130/58 mmHg 98.6 F (37 C) 65  18  98 %  07/17/12 0400 - - - 18  98 %  07/17/12 0000 - - - 18  98 %  07/16/12 2341 129/51 mmHg 98.2 F (36.8 C) 67  18  98 %  07/16/12 2000 - - - 16  97 %  07/16/12 1701 144/55 mmHg - 73  - 97 %       Recent laboratory studies:  Basename 07/17/12 0640 07-20-12 1608  WBC 8.9 8.3  HGB 10.6* 11.2*  HCT 33.9* 35.9*  PLT 229 229    Basename 07/17/12 0640 07-20-12 1608  NA 140 139  K 4.0 4.5  CL 103 103  CO2 28 29  BUN 16 22  CREATININE 0.92 0.90  GLUCOSE 107* 98  CALCIUM 9.1 9.7   Lab Results  Component Value Date   INR 0.92 02/07/2012     Recent Radiographic Studies :  No results found.  DISCHARGE INSTRUCTIONS: Discharge Orders    Future Orders Please Complete By Expires   Diet - low sodium heart healthy      Call MD / Call 911      Comments:   If you experience chest pain or shortness of breath, CALL 911 and be transported to the hospital emergency room.  If you develope a fever above 101 F, pus (white drainage) or increased drainage or redness at the wound, or calf pain, call your surgeon's office.   Constipation Prevention      Comments:   Drink plenty of fluids.  Prune juice may be helpful.  You may use a stool softener, such as Colace (over the counter) 100 mg twice a day.  Use MiraLax (over the counter) for constipation as needed.   Increase activity slowly as tolerated      Driving restrictions      Comments:   No driving for 2 weeks   Lifting restrictions      Comments:   No lifting for 2 weeks   Do not put a pillow under the knee. Place it under the heel.      Change dressing      Comments:   Change  dressing on Thursday, then change the dressing daily with sterile 4 x 4 inch gauze dressing and apply ace wrap      DISCHARGE MEDICATIONS:     Medication List     As of 07/17/2012  4:01 PM    TAKE these medications         acetaminophen 500 MG tablet   Commonly known as: TYLENOL   Take 500 mg by mouth every 6 (six) hours as needed. For pain      amLODipine 5 MG tablet   Commonly known as: NORVASC   Take 5 mg by mouth daily.      aspirin 325 MG EC tablet   Take 1 tablet (325 mg total) by mouth daily with breakfast.      CALCIUM 1000 + D PO   Take 1 tablet by mouth daily.      furosemide 40 MG tablet   Commonly known as: LASIX   Take 40 mg by mouth daily as needed. Water gain      GLUCOSAMINE CHONDR COMPLEX PO   Take 2 capsules by mouth daily.      hydrochlorothiazide 25 MG tablet   Commonly known as: HYDRODIURIL   Take 25 mg by mouth daily.      ibuprofen 200 MG tablet   Commonly known as: ADVIL,MOTRIN   Take 400 mg by mouth every 6 (six) hours as needed. For pain      losartan 100 MG tablet   Commonly known as: COZAAR   Take 100 mg by mouth daily.      methocarbamol 500 MG tablet   Commonly known as: ROBAXIN   Take 1 tablet (500 mg total) by mouth every 6 (six) hours as needed.      metoprolol tartrate 25 MG tablet   Commonly known as: LOPRESSOR   Take 25 mg by mouth 2 (two) times daily.      oxyCODONE 5 MG immediate release tablet  Commonly known as: Oxy IR/ROXICODONE   Take 1-2 tablets (5-10 mg total) by mouth every 4 (four) hours as needed.      pantoprazole 40 MG tablet   Commonly known as: PROTONIX   Take 40 mg by mouth daily.      POTASSIUM GLUCONATE PO   Take 1 capsule by mouth daily.      triamcinolone cream 0.1 %   Commonly known as: KENALOG   Apply 1 application topically 2 (two) times daily. Alternates with lotion      triamcinolone lotion 0.1 %   Commonly known as: KENALOG   Apply 1 application topically 3 (three) times daily.  Alternates with cream      VITAMIN B-12 CR PO   Take 1 tablet by mouth daily.        FOLLOW UP VISIT:       Follow-up Information    Follow up with Raymon Mutton, MD. Call on 07/24/2012.   Contact information:   [address] [place] [address] [phone]          DISPOSITION:  home    CONDITION:  {Good  Manav Pierotti 07/17/2012, 4:01 PM

## 2012-07-17 NOTE — Evaluation (Signed)
Occupational Therapy Evaluation Patient Details Name: [name] MRN: [id number] DOB: 02-20-1941 Today's Date: 07/17/2012 Time: 7829-5621 OT Time Calculation (min): 26 min  OT Assessment / Plan / Recommendation Clinical Impression  Pt. 71 yo female s/p left repair of quadricep tendon. Pt. educated on LB bathing and dressing techniques from previous knee surgery. Has already purchased a hip kit during last surgery and uses at home as needed. States her significant other (A) with needs at home but is unavailable everyday due to work, in which her daughters will step in to help. Pt. is doing well with no further acute OT needs at this time.    OT Assessment  Patient does not need any further OT services    Follow Up Recommendations  Supervision - Intermittent    Barriers to Discharge      Equipment Recommendations  None recommended by PT    Recommendations for Other Services    Frequency       Precautions / Restrictions Precautions Precautions: Knee Precaution Booklet Issued: No Required Braces or Orthoses: Knee Immobilizer - Left Knee Immobilizer - Left: On when out of bed or walking Restrictions Weight Bearing Restrictions: Yes LLE Weight Bearing: Weight bearing as tolerated Other Position/Activity Restrictions: Educated on use of sheet to move left LE during bed mobility and lowering to floor independently.   Pertinent Vitals/Pain 5/10 pain     ADL  Upper Body Dressing: Simulated;Independent Where Assessed - Upper Body Dressing: Unsupported sitting Lower Body Dressing: Simulated;Supervision/safety Where Assessed - Lower Body Dressing: Supported sit to stand Toilet Transfer: Mining engineer Method: Sit to Barista: Regular height toilet Tub/Shower Transfer: Landscape architect Method: Science writer: Walk in shower Equipment Used: Knee Immobilizer;Rolling  walker;Gait belt Transfers/Ambulation Related to ADLs: Pt. is min (A) for sit<>stand from lower surface and supervision for ambulation in room and sit <>stand transfer from elevated surface.  ADL Comments: Pt. already educated on LB dressing and bathing from previous knee surgery and use of AE. Pt has purchased hip kit from gift shop during previous surgery and uses it when needed at home. Pt. able demonstrate how to don/doff knee immobilizer but will require (A) to support LLE to put brace under leg. Simulated walk in shower transfer at supervision level. Pt. states her significant other (A) her at home but is not available everyday due to work but will have daughters to (A) when he is not home.     OT Diagnosis:    OT Problem List:   OT Treatment Interventions:     OT Goals    Visit Information  Last OT Received On: 07/17/12 Assistance Needed: +1    Subjective Data  Subjective: I am pretty tired after my other therapy  Patient Stated Goal: To go home and recover   Prior Functioning     Home Living Lives With: Significant other Available Help at Discharge: Family;Available 24 hours/day Type of Home: House Home Access: Level entry Home Layout: Two level Alternate Level Stairs-Number of Steps: 3 Alternate Level Stairs-Rails: Can reach both Bathroom Shower/Tub: Walk-in Contractor: Standard Home Adaptive Equipment: Engineer, drilling - rolling;Straight cane Prior Function Level of Independence: Independent Able to Take Stairs?: Yes Driving: Yes Vocation: Full time employment Communication Communication: No difficulties Dominant Hand: Right         Vision/Perception     Cognition  Overall Cognitive Status: Appears within functional limits for tasks assessed/performed Arousal/Alertness: Awake/alert Orientation Level: Appears intact for tasks  assessed Behavior During Session: [facility] for tasks performed    Extremity/Trunk Assessment Right  Upper Extremity Assessment RUE ROM/Strength/Tone: [facility] for tasks assessed Left Upper Extremity Assessment LUE ROM/Strength/Tone: [facility] for tasks assessed Trunk Assessment Trunk Assessment: Normal     Mobility Bed Mobility Bed Mobility: Not assessed Sit to Supine: 6: Modified independent (Device/Increase time);HOB flat Scooting to HOB: 6: Modified independent (Device/Increase time);With rail Details for Bed Mobility Assistance: mod independent for bed mobility if using leg lifter or sheet to (A) with supporting LLE  Transfers Transfers: Sit to Stand;Stand to Sit Sit to Stand: 4: Min assist;With upper extremity assist;From chair/3-in-1;From toilet Stand to Sit: 4: Min guard;With upper extremity assist;To toilet;To chair/3-in-1 Details for Transfer Assistance: Assist to translate trunk anterior over BOS with guarding for balance on descent. Pt able to verbalize correct placement of hands and left LE.               Balance Balance Balance Assessed: No   End of Session OT - End of Session Equipment Utilized During Treatment: Gait belt;Left knee immobilizer Activity Tolerance: Patient tolerated treatment well Patient left: in bed;with call bell/phone within reach;with family/visitor present Nurse Communication: Mobility status  GO     Cleora Fleet 07/17/2012, 2:54 PM

## 2012-07-17 NOTE — Op Note (Signed)
Dictation Number:  [id number]

## 2012-07-17 NOTE — Progress Notes (Signed)
Physical Therapy Treatment Patient Details Name: [name] MRN: [id number] DOB: July 11, 1941 Today's Date: 07/17/2012 Time: 1326-1400 PT Time Calculation (min): 34 min  PT Assessment / Plan / Recommendation Comments on Treatment Session  Pt admitted s/p left quadriceps tendon repair. Pt progressing with therapy able to increase ambulation distance/independence as well as independence with stairs. Motivated.    Follow Up Recommendations  Home health PT     Does the patient have the potential to tolerate intense rehabilitation     Barriers to Discharge        Equipment Recommendations  None recommended by PT    Recommendations for Other Services    Frequency Min 5X/week   Plan Discharge plan remains appropriate;Frequency remains appropriate    Precautions / Restrictions Precautions Precautions: Knee Precaution Booklet Issued: No Required Braces or Orthoses: Knee Immobilizer - Left Knee Immobilizer - Left: On when out of bed or walking Restrictions Weight Bearing Restrictions: Yes LLE Weight Bearing: Weight bearing as tolerated Other Position/Activity Restrictions: Educated on use of sheet to move left LE during bed mobility and lowering to floor independently.   Pertinent Vitals/Pain 5/10 in left LE. Pt repositioned.    Mobility  Bed Mobility Bed Mobility: Not assessed Transfers Transfers: Sit to Stand;Stand to Sit (3 trials.) Sit to Stand: 4: Min assist;With upper extremity assist;From chair/3-in-1;From toilet Stand to Sit: 4: Min guard;With upper extremity assist;To toilet;To chair/3-in-1 Details for Transfer Assistance: Assist to translate trunk anterior over BOS with guarding for balance on descent. Pt able to verbalize correct placement of hands and left LE. Ambulation/Gait Ambulation/Gait Assistance: 5: Supervision Ambulation Distance (Feet): 200 Feet (20 and 180 feet.) Assistive device: Rolling walker Ambulation/Gait Assistance Details: Verbal cues for  progression to step-through sequence. Gait Pattern: Step-to pattern;Step-through pattern;Decreased step length - left;Decreased stance time - left Stairs: Yes Stairs Assistance: 4: Min guard Stairs Assistance Details (indicate cue type and reason): Guarding for balance with pt able to verbalize correct sequence. Stair Management Technique: Two rails;Step to pattern;Forwards Number of Stairs: 6  Wheelchair Mobility Wheelchair Mobility: No    Exercises     PT Diagnosis:    PT Problem List:   PT Treatment Interventions:     PT Goals Acute Rehab PT Goals PT Goal Formulation: With patient/family Time For Goal Achievement: 07/24/12 Potential to Achieve Goals: Good PT Goal: Sit to Stand - Progress: Progressing toward goal PT Goal: Stand to Sit - Progress: Progressing toward goal PT Goal: Ambulate - Progress: Progressing toward goal PT Goal: Up/Down Stairs - Progress: Progressing toward goal  Visit Information  Last PT Received On: 07/17/12 Assistance Needed: +1    Subjective Data  Subjective: "I feel more comfortable now." Patient Stated Goal: Go home.   Cognition  Overall Cognitive Status: Appears within functional limits for tasks assessed/performed Arousal/Alertness: Awake/alert Orientation Level: Appears intact for tasks assessed Behavior During Session: [facility] for tasks performed    Balance  Balance Balance Assessed: No  End of Session PT - End of Session Equipment Utilized During Treatment: Left knee immobilizer Activity Tolerance: Patient tolerated treatment well Patient left: in chair;with call bell/phone within reach;with family/visitor present;Other (comment) (With OT present.) Nurse Communication: Mobility status   GP     Cephus Shelling 07/17/2012, 2:09 PM  07/17/2012 Cephus Shelling, PT, DPT [phone]

## 2012-07-17 NOTE — Evaluation (Signed)
Physical Therapy Evaluation Patient Details Name: [name] MRN: [id number] DOB: 04/30/1941 Today's Date: 07/17/2012 Time: 4098-1191 PT Time Calculation (min): 44 min  PT Assessment / Plan / Recommendation Clinical Impression  Pt is a 71 y/o female admitted s/p left quadriceps tendon repair along with the below PT problem list. Pt would benefit from acute PT to maximize independence and facilitate d/c home with HHPT.    PT Assessment  Patient needs continued PT services    Follow Up Recommendations  Home health PT    Does the patient have the potential to tolerate intense rehabilitation      Barriers to Discharge None      Equipment Recommendations  None recommended by PT    Recommendations for Other Services     Frequency Min 5X/week    Precautions / Restrictions Precautions Precautions: Knee Precaution Booklet Issued: No Required Braces or Orthoses: Knee Immobilizer - Left Knee Immobilizer - Left: On when out of bed or walking Restrictions Weight Bearing Restrictions: Yes LLE Weight Bearing: Weight bearing as tolerated   Pertinent Vitals/Pain 6/10 in left quadriceps. Pt repositioned.      Mobility  Bed Mobility Bed Mobility: Not assessed Transfers Transfers: Sit to Stand;Stand to Sit (2 trials.) Sit to Stand: 4: Min assist;With upper extremity assist;From chair/3-in-1 Stand to Sit: 4: Min assist;With upper extremity assist;To chair/3-in-1 Details for Transfer Assistance: Assist for balance with cues for safest hand/left LE placement. Ambulation/Gait Ambulation/Gait Assistance: 4: Min guard Ambulation Distance (Feet): 200 Feet (50 feet and 150 feet.) Assistive device: Rolling walker Ambulation/Gait Assistance Details: Guarding for balance with cues for tall posture and safe sequence. Gait Pattern: Step-to pattern;Decreased step length - left;Decreased stance time - left;Trunk flexed Stairs: Yes Stairs Assistance: 4: Min assist Stairs Assistance Details  (indicate cue type and reason): Assist for balance with cues for "up with good, down with bad." Stair Management Technique: Two rails;Step to pattern;Forwards Number of Stairs: 2  Wheelchair Mobility Wheelchair Mobility: No    Shoulder Instructions     Exercises General Exercises - Lower Extremity Ankle Circles/Pumps: AROM;Left;10 reps;Supine   PT Diagnosis: Difficulty walking;Acute pain  PT Problem List: Decreased strength;Decreased activity tolerance;Decreased balance;Decreased mobility;Decreased knowledge of use of DME;Decreased knowledge of precautions;Pain PT Treatment Interventions: DME instruction;Gait training;Stair training;Functional mobility training;Therapeutic activities;Balance training;Patient/family education   PT Goals Acute Rehab PT Goals PT Goal Formulation: With patient/family Time For Goal Achievement: 07/24/12 Potential to Achieve Goals: Good Pt will go Supine/Side to Sit: with modified independence PT Goal: Supine/Side to Sit - Progress: Goal set today Pt will go Sit to Supine/Side: with modified independence PT Goal: Sit to Supine/Side - Progress: Goal set today Pt will go Sit to Stand: with modified independence PT Goal: Sit to Stand - Progress: Goal set today Pt will go Stand to Sit: with modified independence PT Goal: Stand to Sit - Progress: Goal set today Pt will Ambulate: >150 feet;with modified independence;with least restrictive assistive device PT Goal: Ambulate - Progress: Goal set today Pt will Go Up / Down Stairs: 3-5 stairs;with modified independence;with least restrictive assistive device PT Goal: Up/Down Stairs - Progress: Goal set today  Visit Information  Last PT Received On: 07/17/12 Assistance Needed: +1    Subjective Data  Subjective: "I am glad you came." Patient Stated Goal: Go home.   Prior Functioning  Home Living Lives With: Significant other Available Help at Discharge: Family;Available 24 hours/day Type of Home:  House Home Access: Level entry Home Layout: Two level Alternate Level Stairs-Number of  Steps: 3 Alternate Level Stairs-Rails: Can reach both Bathroom Shower/Tub: Walk-in shower;Curtain Bathroom Toilet: Standard (With riser on top.) Home Adaptive Equipment: Built-in shower seat;Walker - rolling;Straight cane Prior Function Level of Independence: Independent Able to Take Stairs?: Yes Driving: Yes Vocation: Full time employment Communication Communication: No difficulties Dominant Hand: Right    Cognition  Overall Cognitive Status: Appears within functional limits for tasks assessed/performed Arousal/Alertness: Awake/alert Orientation Level: Appears intact for tasks assessed Behavior During Session: [facility] for tasks performed    Extremity/Trunk Assessment Right Upper Extremity Assessment RUE ROM/Strength/Tone: Within functional levels RUE Sensation: WFL - Light Touch RUE Coordination: WFL - gross motor Left Upper Extremity Assessment LUE ROM/Strength/Tone: Within functional levels LUE Sensation: WFL - Light Touch LUE Coordination: WFL - gross motor Right Lower Extremity Assessment RLE ROM/Strength/Tone: Within functional levels RLE Sensation: WFL - Light Touch RLE Coordination: WFL - gross motor Left Lower Extremity Assessment LLE ROM/Strength/Tone: Deficits;Due to pain LLE ROM/Strength/Tone Deficits: 2/5 throughout. Left knee ROM not tested. LLE Sensation: WFL - Proprioception LLE Coordination: WFL - gross motor Trunk Assessment Trunk Assessment: Normal   Balance Balance Balance Assessed: No  End of Session PT - End of Session Equipment Utilized During Treatment: Gait belt;Left knee immobilizer Activity Tolerance: Patient tolerated treatment well Patient left: in chair;with call bell/phone within reach;with family/visitor present Nurse Communication: Mobility status  GP     Cephus Shelling 07/17/2012, 10:29 AM  07/17/2012 Cephus Shelling, PT, DPT [phone]

## 2012-07-17 NOTE — Progress Notes (Signed)
SPORTS MEDICINE AND JOINT REPLACEMENT  Georgena Spurling, MD   Altamese Cabal, PA-C [address] [place], [place], [place]  [address]                             [phone]   PROGRESS NOTE  Subjective:  negative for Chest Pain  negative for Shortness of Breath  negative for Nausea/Vomiting   negative for Calf Pain  negative for Bowel Movement   Tolerating Diet: yes         Patient reports pain as 3 on 0-10 scale.    Objective: Vital signs in last 24 hours:   Patient Vitals for the past 24 hrs:  BP Temp Pulse Resp SpO2  07/17/12 0709 130/58 mmHg 98.6 F (37 C) 65  18  98 %  07/17/12 0400 - - - 18  98 %  07/17/12 0000 - - - 18  98 %  07/16/12 2341 129/51 mmHg 98.2 F (36.8 C) 67  18  98 %  07/16/12 2000 - - - 16  97 %  07/16/12 1701 144/55 mmHg - 73  - 97 %  07/16/12 1600 144/55 mmHg - 74  - -  07/16/12 1351 135/53 mmHg 97.8 F (36.6 C) 69  16  100 %  07/16/12 1325 139/53 mmHg 97.2 F (36.2 C) 69  19  100 %  07/16/12 1310 139/54 mmHg - 69  19  100 %  07/16/12 1255 132/46 mmHg - 70  16  100 %  07/16/12 1240 144/42 mmHg - 70  19  100 %  07/16/12 1225 153/48 mmHg 97.2 F (36.2 C) 72  21  99 %  07/16/12 1023 - - 72  19  100 %  07/16/12 1015 - - 74  18  100 %    @flow {1959:LAST@   Intake/Output from previous day:   10/21 0701 - 10/22 0700 In: 850 [I.V.:800] Out: -    Intake/Output this shift:       Intake/Output      10/21 0701 - 10/22 0700 10/22 0701 - 10/23 0700   I.V. 800    IV Piggyback 50    Total Intake 850    Net +850         Urine Occurrence 1 x    Stool Occurrence 1 x       LABORATORY DATA:  Basename 07/17/12 0640 07/10/12 1608  WBC 8.9 8.3  HGB 10.6* 11.2*  HCT 33.9* 35.9*  PLT 229 229    Basename 07/17/12 0640 07/10/12 1608  NA 140 139  K 4.0 4.5  CL 103 103  CO2 28 29  BUN 16 22  CREATININE 0.92 0.90  GLUCOSE 107* 98  CALCIUM 9.1 9.7   Lab Results  Component Value Date   INR 0.92 02/07/2012    Examination:  General appearance:  alert, cooperative and no distress Extremities: extremities normal, atraumatic, no cyanosis or edema and Homans sign is negative, no sign of DVT  Wound Exam: clean, dry, intact   Drainage:  Scant/small amount Serosanguinous exudate  Motor Exam: EHL and FHL Intact  Sensory Exam: Deep Peroneal normal  Vascular Exam:    Assessment:    1 Day Post-Op  Procedure(s) (LRB): REPAIR QUADRICEP TENDON (Left)  ADDITIONAL DIAGNOSIS:  Active Problems:  * No active hospital problems. *   Acute Blood Loss Anemia   Plan: Physical Therapy as ordered Weight Bearing as Tolerated (WBAT)  DVT Prophylaxis:  Aspirin  DISCHARGE PLAN: Home  DISCHARGE NEEDS: HHPT, Walker and 3-in-1 comode seat         [name] 07/17/2012, 9:20 AM

## 2012-07-17 NOTE — Progress Notes (Signed)
I agree with the following treatment note after reviewing documentation.   Johnston, Javell Blackburn Brynn   OTR/L Pager: [phone] Office: [phone] .   

## 2012-07-17 NOTE — Evaluation (Signed)
I agree with the following treatment note after reviewing documentation.   Johnston, Josemiguel Gries Brynn   OTR/L Pager: [phone] Office: [phone] .   

## 2012-07-17 NOTE — Op Note (Signed)
Dictation Number:  [id number]

## 2012-07-17 NOTE — Progress Notes (Signed)
Occupational Therapy Discharge Patient Details Name: [name] MRN: [id number] DOB: June 06, 1941 Today's Date: 07/17/2012 Time: 4098-1191 OT Time Calculation (min): 26 min  Patient discharged from OT services secondary to Pt. educated on LB bathing and dressing techniques from previous knee surgery and has purchased a hip kit prior to recent admission. Has significant other and daughters available to (A). Pt. is doing well with no further acute OT needs.   Please see latest therapy progress note for current level of functioning and progress toward goals.    Progress and discharge plan discussed with patient and/or caregiver: Patient/Caregiver agrees with plan  GO     Cleora Fleet 07/17/2012, 2:55 PM

## 2012-07-17 NOTE — Care Management Note (Signed)
    Page 1 of 1   07/17/2012     11:44:52 AM   CARE MANAGEMENT NOTE 07/17/2012  Patient:  [facility]   Account Number:  [id number]  Date Initiated:  07/17/2012  Documentation initiated by:  [name]  Subjective/Objective Assessment:   repair quadracep tendon  No HHPT at this time  has DME     Action/Plan:   home with self care   Anticipated DC Date:  07/18/2012   Anticipated DC Plan:  HOME/SELF CARE      DC Planning Services  CM consult      Choice offered to / List presented to:             Status of service:  Completed, signed off Medicare Important Message given?  NO (If response is "NO", the following Medicare IM given date fields will be blank) Date Medicare IM given:   Date Additional Medicare IM given:    Discharge Disposition:    Per UR Regulation:  Reviewed for med. necessity/level of care/duration of stay  If discussed at Long Length of Stay Meetings, dates discussed:    Comments:  07/17/12  11:41 Tabitha Guarneri RN/CM received HHPT and DME referral Spoke with patient, per patient Dr. Sherlean Galloway told her she is not to have therapy until she returns to office in 1week. Clarified orders with Tabitha Cabal PA/Dr. Sherlean Galloway. Per [name] patient does not need HHPT, is to see MD in 1 week and then they will determine what is needed. Per [name] she has RW, does not need 3n1 This CM will continue to follow for any further orders.

## 2012-07-18 NOTE — Op Note (Signed)
NAMENATHASHA, [name]                ACCOUNT NO.:  [id number]  MEDICAL RECORD NO.:  [id number]  LOCATION:  5N07C                        FACILITY:  [facility]  PHYSICIAN:  Mila Homer. Sherlean Foot, M.D. DATE OF BIRTH:  May 06, 1941  DATE OF PROCEDURE:  07/16/2012 DATE OF DISCHARGE:  07/17/2012                              OPERATIVE REPORT   SURGEON:  Mila Homer. Sherlean Foot, M.D.  ASSISTANT:  Altamese Cabal, PA-C  ANESTHESIA:  General.  PREOPERATIVE DIAGNOSIS:  Left knee quad rupture.  POSTOPERATIVE DIAGNOSIS:  Left knee quad rupture.  PROCEDURE:  VMO repair of vastus medialis obliquus repair.  INDICATIONS FOR PROCEDURE:  The patient is a 71 year old white female who is in her 3rd month status post a left total knee replacement.  At 6 weeks, she did a swab at the top of the immobilizer and tried to allow it to heal and it did not and presents for exploration and potential repair today.  DESCRIPTION OF PROCEDURE:  The patient was laid supine and administered general anesthesia.  Did sterile prep and drape of the left leg and exsanguinated the extremity, elevated the tourniquet to 95 mmHg and set for 1 hour.  I made the central 2/3rds of the incision and then dissected with a #10-blade down to the quadriceps tendon.  There was obvious lack of healing of the VMO to the patella medially.  I was able to get through the joint synovium very easily.  I then debrided some scar tissue there and lavaged out the knee joint.  There was a little bit of scar tissue laterally as well, which I did release effectively performing somewhat of a lateral release.  I then freshened up the tendinous tissue on the patella and quad tendon.  I then performed a VMO advancement of about 1 cm with horizontal #2 FiberWire sutures and then oversewn with figure-of-eight #1 Vicryl sutures.  This allowed the patella to track very well.  The limitation of motion was 0 to about 90 degrees.  I then lavaged, closed with buried 0 and 2-0  Vicryl sutures subcuticular Monocryl and skin staples.  Dressed with Xeroform, dressing sponges, sterile Webril, and Ace wrap.  COMPLICATIONS:  None.  DRAINS:  None.          ______________________________ Mila Homer. Sherlean Foot, M.D.     SDL/MEDQ  D:  07/17/2012  T:  07/18/2012  Job:  [id number]

## 2012-07-19 NOTE — Progress Notes (Signed)
Utilization review completed. Tildon Silveria, RN, BSN. 

## 2012-07-20 ENCOUNTER — Encounter (HOSPITAL_COMMUNITY): Payer: Self-pay

## 2012-09-26 DIAGNOSIS — Z8709 Personal history of other diseases of the respiratory system: Secondary | ICD-10-CM

## 2012-09-26 HISTORY — DX: Personal history of other diseases of the respiratory system: Z87.09

## 2013-07-30 DIAGNOSIS — Z96659 Presence of unspecified artificial knee joint: Secondary | ICD-10-CM | POA: Insufficient documentation

## 2013-10-07 ENCOUNTER — Other Ambulatory Visit: Payer: Self-pay | Admitting: Orthopedic Surgery

## 2013-10-21 ENCOUNTER — Encounter (HOSPITAL_COMMUNITY): Payer: Self-pay | Admitting: Pharmacy Technician

## 2013-10-22 ENCOUNTER — Encounter (HOSPITAL_COMMUNITY): Payer: Self-pay

## 2013-10-22 ENCOUNTER — Encounter (HOSPITAL_COMMUNITY)
Admission: RE | Admit: 2013-10-22 | Discharge: 2013-10-22 | Disposition: A | Discharge status: Home / Self Care | Payer: BC Managed Care – PPO | Source: Ambulatory Visit | Attending: Orthopedic Surgery | Admitting: Orthopedic Surgery

## 2013-10-22 DIAGNOSIS — Z01818 Encounter for other preprocedural examination: Secondary | ICD-10-CM | POA: Insufficient documentation

## 2013-10-22 DIAGNOSIS — Z01812 Encounter for preprocedural laboratory examination: Secondary | ICD-10-CM | POA: Insufficient documentation

## 2013-10-22 DIAGNOSIS — Z01811 Encounter for preprocedural respiratory examination: Secondary | ICD-10-CM | POA: Insufficient documentation

## 2013-10-22 HISTORY — DX: Erythematous condition, unspecified: L53.9

## 2013-10-22 HISTORY — DX: Frequency of micturition: R35.0

## 2013-10-22 HISTORY — DX: Personal history of colonic polyps: Z86.010

## 2013-10-22 HISTORY — DX: Unspecified cataract: H26.9

## 2013-10-22 HISTORY — DX: Dorsalgia, unspecified: M54.9

## 2013-10-22 HISTORY — DX: Localized edema: R60.0

## 2013-10-22 HISTORY — DX: Personal history of other diseases of the respiratory system: Z87.09

## 2013-10-22 HISTORY — DX: Effusion, unspecified joint: M25.40

## 2013-10-22 HISTORY — DX: Personal history of other medical treatment: Z92.89

## 2013-10-22 HISTORY — DX: Headache: R51

## 2013-10-22 HISTORY — DX: Personal history of colon polyps, unspecified: Z86.0100

## 2013-10-22 HISTORY — DX: Nocturia: R35.1

## 2013-10-22 HISTORY — DX: Personal history of other infectious and parasitic diseases: Z86.19

## 2013-10-22 HISTORY — DX: Pain in unspecified joint: M25.50

## 2013-10-22 HISTORY — DX: Urgency of urination: R39.15

## 2013-10-22 HISTORY — DX: Diarrhea, unspecified: R19.7

## 2013-10-22 HISTORY — DX: Anesthesia of skin: R20.0

## 2013-10-22 HISTORY — DX: Edema, unspecified: R60.9

## 2013-10-22 LAB — COMPREHENSIVE METABOLIC PANEL
ALT: 16 U/L (ref 0–35)
AST: 15 U/L (ref 0–37)
Albumin: 3.6 g/dL (ref 3.5–5.2)
Alkaline Phosphatase: 69 U/L (ref 39–117)
BUN: 18 mg/dL (ref 6–23)
CO2: 28 mEq/L (ref 19–32)
Calcium: 9.4 mg/dL (ref 8.4–10.5)
Chloride: 102 mEq/L (ref 96–112)
Creatinine, Ser: 0.85 mg/dL (ref 0.50–1.10)
GFR calc Af Amer: 77 mL/min — ABNORMAL LOW (ref >90)
GFR calc non Af Amer: 66 mL/min — ABNORMAL LOW (ref >90)
Glucose, Bld: 100 mg/dL — ABNORMAL HIGH (ref 70–99)
Potassium: 4 mEq/L (ref 3.7–5.3)
Sodium: 142 mEq/L (ref 137–147)
Total Bilirubin: 0.9 mg/dL (ref 0.3–1.2)
Total Protein: 7.4 g/dL (ref 6.0–8.3)

## 2013-10-22 LAB — SURGICAL PCR SCREEN
MRSA, PCR: NEGATIVE
Staphylococcus aureus: NEGATIVE

## 2013-10-22 LAB — CBC WITH DIFFERENTIAL/PLATELET
Basophils Absolute: 0 10*3/uL (ref 0.0–0.1)
Basophils Relative: 0 % (ref 0–1)
Eosinophils Absolute: 0.2 10*3/uL (ref 0.0–0.7)
Eosinophils Relative: 2 % (ref 0–5)
HCT: 38.5 % (ref 36.0–46.0)
Hemoglobin: 12.6 g/dL (ref 12.0–15.0)
Lymphocytes Relative: 21 % (ref 12–46)
Lymphs Abs: 2.3 10*3/uL (ref 0.7–4.0)
MCH: 29.4 pg (ref 26.0–34.0)
MCHC: 32.7 g/dL (ref 30.0–36.0)
MCV: 90 fL (ref 78.0–100.0)
Monocytes Absolute: 0.9 10*3/uL (ref 0.1–1.0)
Monocytes Relative: 8 % (ref 3–12)
Neutro Abs: 7.4 10*3/uL (ref 1.7–7.7)
Neutrophils Relative %: 68 % (ref 43–77)
Platelets: 260 10*3/uL (ref 150–400)
RBC: 4.28 MIL/uL (ref 3.87–5.11)
RDW: 14.5 % (ref 11.5–15.5)
WBC: 10.9 10*3/uL — ABNORMAL HIGH (ref 4.0–10.5)

## 2013-10-22 LAB — TYPE AND SCREEN
ABO/RH(D): B POS
Antibody Screen: NEGATIVE

## 2013-10-22 LAB — URINALYSIS, ROUTINE W REFLEX MICROSCOPIC
Bilirubin Urine: NEGATIVE
Glucose, UA: NEGATIVE mg/dL
Hgb urine dipstick: NEGATIVE
Ketones, ur: NEGATIVE mg/dL
Leukocytes, UA: NEGATIVE
Nitrite: NEGATIVE
Protein, ur: NEGATIVE mg/dL
Specific Gravity, Urine: 1.009 (ref 1.005–1.030)
Urobilinogen, UA: 0.2 mg/dL (ref 0.0–1.0)
pH: 5.5 (ref 5.0–8.0)

## 2013-10-22 LAB — APTT: aPTT: 30 seconds (ref 24–37)

## 2013-10-22 LAB — PROTIME-INR
INR: 0.98 (ref 0.00–1.49)
Prothrombin Time: 12.8 seconds (ref 11.6–15.2)

## 2013-10-22 MED ORDER — CHLORHEXIDINE GLUCONATE 4 % EX LIQD: 60.0000 mL | Freq: Once | CUTANEOUS | Status: DC

## 2013-10-22 NOTE — Progress Notes (Addendum)
  Pt doesn't have a cardiologist  Dr.William Criss Alvinerince is [facility]  [place]  Sleep study in epic under media tab from 2010  Stress test in epic under media tab from 2013  Denies ever having an echo or stress  Denies EKG or CXR in past yr

## 2013-10-22 NOTE — Pre-Procedure Instructions (Signed)
Tabitha CousinGlenda Galloway  10/22/2013   Your procedure is scheduled on:  Mon, Feb 9 @ 10:00 AM  [facility]  at 7:00 AM.  Call this number if you have problems the morning of surgery: [phone]   Remember:   Do not eat food or drink liquids after midnight.   Take these medicines the morning of surgery with A SIP OF WATER: Amlodipine(Norvasc),Metoprolol(Lopressor),and Omeprazole(Prilosec)              Stop taking your Ibuprofen a week before surgery. No Goody's,BC's,Aleve,Aspirin,Fish Oil,or any Herbal Medications   Do not wear jewelry, make-up or nail polish.  Do not wear lotions, powders, or perfumes. You may wear deodorant.  Do not shave 48 hours prior to surgery.   Do not bring valuables to the hospital.  [facility] is not responsible                  for any belongings or valuables.               Contacts, dentures or bridgework may not be worn into surgery.  Leave suitcase in the car. After surgery it may be brought to your room.  For patients admitted to the hospital, discharge time is determined by your                treatment team.               Special Instructions: Shower using CHG 2 nights before surgery and the night before surgery.  If you shower the day of surgery use CHG.  Use special wash - you have one bottle of CHG for all showers.  You should use approximately 1/3 of the bottle for each shower.   Please read over the following fact sheets that you were given: Pain Booklet, Coughing and Deep Breathing, Blood Transfusion Information, MRSA Information and Surgical Site Infection Prevention

## 2013-10-23 LAB — URINE CULTURE: Colony Count: 40000

## 2013-10-23 NOTE — Progress Notes (Signed)
Anesthesia Chart Review:  Patient is a 73 year old female scheduled for right TKR on 11/04/13 by Dr. Sherlean FootLucey.  Her PCP is Dr. Margarito LinerW.D. Criss AlvinePrince, III at [facility] in [place]. Her history includes non-smoker, morbid obesity, HTN, OSA without use of CPAP, anxiety, venous insufficiency, osteoarthritis, GERD, hiatal hernia, cholecystectomy, hysterectomy, left TKR 02/13/12, post-operative N/V.     Her EKG from 10/22/13 showed: NSR, low voltage QRS, cannot rule out anteroseptal infarct (age undetermined). I think her EKG is stable when compared to 11/28/11 [facility] that is scanned under the Media tab, Correspondence 02/13/12.  On 01/11/12 she had a nuclear stress test at [facility] in [place] that showed:  Negative Lexiscan stress Cardiolite study with no evidence of inducible ischemia or myocaridal scar and normal LV size and systolic function, EF 71%. (Report scanned under Media tab, Correspondence, 02/13/12.)  CXR on 10/22/13 showed: No acute cardiopulmonary disease.  Pleural parenchymal scarring both lung bases.  Preoperative labs noted.    If no acute changes then I anticipate that she can proceed as planned.  Velna Ochsllison Skyah Hannon, PA-C [facility] Short Stay Center/Anesthesiology Phone [phone] 10/23/2013 3:02 PM

## 2013-11-03 MED ORDER — DEXTROSE 5 % IV SOLN
3.0000 g | INTRAVENOUS | Status: DC
  Filled 2013-11-03: qty 3000

## 2013-11-03 MED ORDER — TRANEXAMIC ACID 100 MG/ML IV SOLN
1000.0000 mg | INTRAVENOUS | Status: DC
  Filled 2013-11-03: qty 10

## 2013-11-03 MED ORDER — SODIUM CHLORIDE 0.9 % IV SOLN: INTRAVENOUS | Status: DC

## 2013-11-04 ENCOUNTER — Encounter (HOSPITAL_COMMUNITY): Payer: BC Managed Care – PPO | Admitting: Vascular Surgery

## 2013-11-04 ENCOUNTER — Encounter (HOSPITAL_COMMUNITY): Payer: Self-pay | Admitting: *Deleted

## 2013-11-04 ENCOUNTER — Inpatient Hospital Stay (HOSPITAL_COMMUNITY): Payer: BC Managed Care – PPO | Admitting: Anesthesiology

## 2013-11-04 ENCOUNTER — Encounter (HOSPITAL_COMMUNITY)
Admission: RE | Disposition: A | Discharge status: Home Health | Payer: Self-pay | Source: Ambulatory Visit | Attending: Orthopedic Surgery

## 2013-11-04 ENCOUNTER — Inpatient Hospital Stay (HOSPITAL_COMMUNITY)
Admission: RE | Admit: 2013-11-04 | Discharge: 2013-11-06 | DRG: 470 | Disposition: A | Discharge status: Home Health | Payer: BC Managed Care – PPO | Source: Ambulatory Visit | Attending: Orthopedic Surgery | Admitting: Orthopedic Surgery

## 2013-11-04 DIAGNOSIS — Z7982 Long term (current) use of aspirin: Secondary | ICD-10-CM

## 2013-11-04 DIAGNOSIS — D62 Acute posthemorrhagic anemia: Secondary | ICD-10-CM | POA: Diagnosis not present

## 2013-11-04 DIAGNOSIS — I10 Essential (primary) hypertension: Secondary | ICD-10-CM | POA: Diagnosis present

## 2013-11-04 DIAGNOSIS — Z96659 Presence of unspecified artificial knee joint: Secondary | ICD-10-CM

## 2013-11-04 DIAGNOSIS — Z79899 Other long term (current) drug therapy: Secondary | ICD-10-CM

## 2013-11-04 DIAGNOSIS — G4730 Sleep apnea, unspecified: Secondary | ICD-10-CM | POA: Diagnosis present

## 2013-11-04 DIAGNOSIS — M1710 Unilateral primary osteoarthritis, unspecified knee: Principal | ICD-10-CM | POA: Diagnosis present

## 2013-11-04 DIAGNOSIS — K219 Gastro-esophageal reflux disease without esophagitis: Secondary | ICD-10-CM | POA: Diagnosis present

## 2013-11-04 HISTORY — PX: TOTAL KNEE ARTHROPLASTY: SHX125

## 2013-11-04 LAB — CREATININE, SERUM
Creatinine, Ser: 0.82 mg/dL (ref 0.50–1.10)
GFR calc Af Amer: 80 mL/min — ABNORMAL LOW (ref >90)
GFR calc non Af Amer: 69 mL/min — ABNORMAL LOW (ref >90)

## 2013-11-04 LAB — CBC
HCT: 35.6 % — ABNORMAL LOW (ref 36.0–46.0)
Hemoglobin: 11.5 g/dL — ABNORMAL LOW (ref 12.0–15.0)
MCH: 29.4 pg (ref 26.0–34.0)
MCHC: 32.3 g/dL (ref 30.0–36.0)
MCV: 91 fL (ref 78.0–100.0)
Platelets: 221 10*3/uL (ref 150–400)
RBC: 3.91 MIL/uL (ref 3.87–5.11)
RDW: 14.4 % (ref 11.5–15.5)
WBC: 13.8 10*3/uL — ABNORMAL HIGH (ref 4.0–10.5)

## 2013-11-04 SURGERY — ARTHROPLASTY, KNEE, TOTAL
Anesthesia: General | Site: Knee | Laterality: Right

## 2013-11-04 MED ORDER — SODIUM CHLORIDE 0.9 % IJ SOLN
INTRAMUSCULAR | Status: AC
  Filled 2013-11-04: qty 9

## 2013-11-04 MED ORDER — BUPIVACAINE-EPINEPHRINE PF 0.5-1:200000 % IJ SOLN
INTRAMUSCULAR | Status: DC | PRN
  Administered 2013-11-04: 30 mL via PERINEURAL

## 2013-11-04 MED ORDER — DOCUSATE SODIUM 100 MG PO CAPS
100.0000 mg | ORAL_CAPSULE | Freq: Two times a day (BID) | ORAL | Status: DC
  Administered 2013-11-04 – 2013-11-06 (×4): 100 mg via ORAL
  Filled 2013-11-04 (×5): qty 1

## 2013-11-04 MED ORDER — PROPOFOL 10 MG/ML IV BOLUS
INTRAVENOUS | Status: DC | PRN
  Administered 2013-11-04: 200 mg via INTRAVENOUS

## 2013-11-04 MED ORDER — SENNOSIDES-DOCUSATE SODIUM 8.6-50 MG PO TABS: 1.0000 | ORAL_TABLET | Freq: Every evening | ORAL | Status: DC | PRN

## 2013-11-04 MED ORDER — GLYCOPYRROLATE 0.2 MG/ML IJ SOLN
INTRAMUSCULAR | Status: DC | PRN
  Administered 2013-11-04: 0.2 mg via INTRAVENOUS

## 2013-11-04 MED ORDER — FENTANYL CITRATE 0.05 MG/ML IJ SOLN
INTRAMUSCULAR | Status: AC
  Filled 2013-11-04: qty 5

## 2013-11-04 MED ORDER — GLYCOPYRROLATE 0.2 MG/ML IJ SOLN
INTRAMUSCULAR | Status: AC
  Filled 2013-11-04: qty 1

## 2013-11-04 MED ORDER — MIDAZOLAM HCL 2 MG/2ML IJ SOLN
INTRAMUSCULAR | Status: AC
  Filled 2013-11-04: qty 2

## 2013-11-04 MED ORDER — PANTOPRAZOLE SODIUM 40 MG PO TBEC
40.0000 mg | DELAYED_RELEASE_TABLET | Freq: Every day | ORAL | Status: DC
  Administered 2013-11-05 – 2013-11-06 (×2): 40 mg via ORAL
  Filled 2013-11-04 (×2): qty 1

## 2013-11-04 MED ORDER — LACTATED RINGERS IV SOLN
INTRAVENOUS | Status: DC
  Administered 2013-11-04: 09:00:00 via INTRAVENOUS

## 2013-11-04 MED ORDER — ONDANSETRON HCL 4 MG/2ML IJ SOLN: 4.0000 mg | Freq: Four times a day (QID) | INTRAMUSCULAR | Status: DC | PRN

## 2013-11-04 MED ORDER — FENTANYL CITRATE 0.05 MG/ML IJ SOLN
INTRAMUSCULAR | Status: AC
  Administered 2013-11-04: 100 ug
  Filled 2013-11-04: qty 2

## 2013-11-04 MED ORDER — BUPIVACAINE LIPOSOME 1.3 % IJ SUSP
20.0000 mL | Freq: Once | INTRAMUSCULAR | Status: DC
  Filled 2013-11-04: qty 20

## 2013-11-04 MED ORDER — MEPERIDINE HCL 25 MG/ML IJ SOLN: 6.2500 mg | INTRAMUSCULAR | Status: DC | PRN

## 2013-11-04 MED ORDER — BUPIVACAINE LIPOSOME 1.3 % IJ SUSP
INTRAMUSCULAR | Status: DC | PRN
  Administered 2013-11-04: 20 mL

## 2013-11-04 MED ORDER — OXYCODONE HCL 5 MG/5ML PO SOLN: 5.0000 mg | Freq: Once | ORAL | Status: DC | PRN

## 2013-11-04 MED ORDER — DEXTROSE 5 % IV SOLN
3.0000 g | INTRAVENOUS | Status: DC | PRN
  Administered 2013-11-04: 3 g via INTRAVENOUS

## 2013-11-04 MED ORDER — LACTATED RINGERS IV SOLN
INTRAVENOUS | Status: DC | PRN
  Administered 2013-11-04 (×2): via INTRAVENOUS

## 2013-11-04 MED ORDER — FUROSEMIDE 40 MG PO TABS: 40.0000 mg | ORAL_TABLET | Freq: Every day | ORAL | Status: DC | PRN

## 2013-11-04 MED ORDER — CELECOXIB 200 MG PO CAPS
200.0000 mg | ORAL_CAPSULE | Freq: Two times a day (BID) | ORAL | Status: DC
  Administered 2013-11-04 – 2013-11-06 (×4): 200 mg via ORAL
  Filled 2013-11-04 (×6): qty 1

## 2013-11-04 MED ORDER — ENOXAPARIN SODIUM 30 MG/0.3ML ~~LOC~~ SOLN
30.0000 mg | Freq: Two times a day (BID) | SUBCUTANEOUS | Status: DC
  Administered 2013-11-05 – 2013-11-06 (×3): 30 mg via SUBCUTANEOUS
  Filled 2013-11-04 (×5): qty 0.3

## 2013-11-04 MED ORDER — METOPROLOL TARTRATE 25 MG PO TABS
25.0000 mg | ORAL_TABLET | Freq: Two times a day (BID) | ORAL | Status: DC
  Administered 2013-11-04 – 2013-11-06 (×4): 25 mg via ORAL
  Filled 2013-11-04 (×5): qty 1

## 2013-11-04 MED ORDER — ACETAMINOPHEN 325 MG PO TABS: 650.0000 mg | ORAL_TABLET | Freq: Four times a day (QID) | ORAL | Status: DC | PRN

## 2013-11-04 MED ORDER — BISACODYL 5 MG PO TBEC: 5.0000 mg | DELAYED_RELEASE_TABLET | Freq: Every day | ORAL | Status: DC | PRN

## 2013-11-04 MED ORDER — SODIUM CHLORIDE 0.9 % IR SOLN
Status: DC | PRN
  Administered 2013-11-04: 1000 mL

## 2013-11-04 MED ORDER — PROPOFOL 10 MG/ML IV BOLUS
INTRAVENOUS | Status: AC
  Filled 2013-11-04: qty 20

## 2013-11-04 MED ORDER — HYDROMORPHONE HCL PF 1 MG/ML IJ SOLN: 1.0000 mg | INTRAMUSCULAR | Status: DC | PRN

## 2013-11-04 MED ORDER — DEXTROSE 5 % IV SOLN
500.0000 mg | Freq: Four times a day (QID) | INTRAVENOUS | Status: DC | PRN
  Filled 2013-11-04: qty 5

## 2013-11-04 MED ORDER — LABETALOL HCL 5 MG/ML IV SOLN
INTRAVENOUS | Status: DC | PRN
  Administered 2013-11-04: 10 mg via INTRAVENOUS
  Administered 2013-11-04 (×2): 5 mg via INTRAVENOUS

## 2013-11-04 MED ORDER — MENTHOL 3 MG MT LOZG: 1.0000 | LOZENGE | OROMUCOSAL | Status: DC | PRN

## 2013-11-04 MED ORDER — LABETALOL HCL 5 MG/ML IV SOLN
INTRAVENOUS | Status: AC
  Filled 2013-11-04: qty 4

## 2013-11-04 MED ORDER — METOCLOPRAMIDE HCL 10 MG PO TABS: 5.0000 mg | ORAL_TABLET | Freq: Three times a day (TID) | ORAL | Status: DC | PRN

## 2013-11-04 MED ORDER — PHENOL 1.4 % MT LIQD: 1.0000 | OROMUCOSAL | Status: DC | PRN

## 2013-11-04 MED ORDER — ONDANSETRON HCL 4 MG/2ML IJ SOLN
INTRAMUSCULAR | Status: AC
  Filled 2013-11-04: qty 2

## 2013-11-04 MED ORDER — MIDAZOLAM HCL 2 MG/2ML IJ SOLN: 0.5000 mg | Freq: Once | INTRAMUSCULAR | Status: DC | PRN

## 2013-11-04 MED ORDER — ONDANSETRON HCL 4 MG PO TABS: 4.0000 mg | ORAL_TABLET | Freq: Four times a day (QID) | ORAL | Status: DC | PRN

## 2013-11-04 MED ORDER — HYDROMORPHONE HCL PF 1 MG/ML IJ SOLN
INTRAMUSCULAR | Status: AC
  Filled 2013-11-04: qty 1

## 2013-11-04 MED ORDER — HYDROMORPHONE HCL PF 1 MG/ML IJ SOLN
0.2500 mg | INTRAMUSCULAR | Status: DC | PRN
  Administered 2013-11-04 (×2): 0.25 mg via INTRAVENOUS

## 2013-11-04 MED ORDER — METOCLOPRAMIDE HCL 5 MG/ML IJ SOLN: 5.0000 mg | Freq: Three times a day (TID) | INTRAMUSCULAR | Status: DC | PRN

## 2013-11-04 MED ORDER — DIPHENHYDRAMINE HCL 12.5 MG/5ML PO ELIX
12.5000 mg | ORAL_SOLUTION | ORAL | Status: DC | PRN
  Administered 2013-11-05: 25 mg via ORAL
  Filled 2013-11-04: qty 10

## 2013-11-04 MED ORDER — PROMETHAZINE HCL 25 MG/ML IJ SOLN
6.2500 mg | INTRAMUSCULAR | Status: DC | PRN
  Administered 2013-11-04: 6.25 mg via INTRAVENOUS

## 2013-11-04 MED ORDER — CEFAZOLIN SODIUM-DEXTROSE 2-3 GM-% IV SOLR
2.0000 g | Freq: Four times a day (QID) | INTRAVENOUS | Status: AC
  Administered 2013-11-04 (×2): 2 g via INTRAVENOUS
  Filled 2013-11-04 (×2): qty 50

## 2013-11-04 MED ORDER — ZOLPIDEM TARTRATE 5 MG PO TABS: 5.0000 mg | ORAL_TABLET | Freq: Every evening | ORAL | Status: DC | PRN

## 2013-11-04 MED ORDER — PROMETHAZINE HCL 25 MG/ML IJ SOLN
INTRAMUSCULAR | Status: AC
  Filled 2013-11-04: qty 1

## 2013-11-04 MED ORDER — OXYCODONE HCL ER 10 MG PO T12A
10.0000 mg | EXTENDED_RELEASE_TABLET | Freq: Two times a day (BID) | ORAL | Status: DC
  Administered 2013-11-04 – 2013-11-06 (×4): 10 mg via ORAL
  Filled 2013-11-04 (×4): qty 1

## 2013-11-04 MED ORDER — MIDAZOLAM HCL 2 MG/2ML IJ SOLN
INTRAMUSCULAR | Status: AC
  Administered 2013-11-04: 2 mg
  Filled 2013-11-04: qty 2

## 2013-11-04 MED ORDER — OXYCODONE HCL 5 MG PO TABS
5.0000 mg | ORAL_TABLET | ORAL | Status: DC | PRN
  Administered 2013-11-04: 5 mg via ORAL
  Administered 2013-11-04 – 2013-11-06 (×4): 10 mg via ORAL
  Filled 2013-11-04 (×4): qty 2
  Filled 2013-11-04: qty 1

## 2013-11-04 MED ORDER — SCOPOLAMINE 1 MG/3DAYS TD PT72
1.0000 | MEDICATED_PATCH | TRANSDERMAL | Status: DC
  Administered 2013-11-04: 1.5 mg via TRANSDERMAL
  Filled 2013-11-04: qty 1

## 2013-11-04 MED ORDER — OXYCODONE HCL 5 MG PO TABS: 5.0000 mg | ORAL_TABLET | Freq: Once | ORAL | Status: DC | PRN

## 2013-11-04 MED ORDER — ONDANSETRON HCL 4 MG/2ML IJ SOLN
INTRAMUSCULAR | Status: DC | PRN
  Administered 2013-11-04: 4 mg via INTRAVENOUS

## 2013-11-04 MED ORDER — SODIUM CHLORIDE 0.9 % IV SOLN: INTRAVENOUS | Status: DC

## 2013-11-04 MED ORDER — BUPIVACAINE-EPINEPHRINE 0.5% -1:200000 IJ SOLN
INTRAMUSCULAR | Status: DC | PRN
  Administered 2013-11-04: 30 mL

## 2013-11-04 MED ORDER — METHOCARBAMOL 500 MG PO TABS
500.0000 mg | ORAL_TABLET | Freq: Four times a day (QID) | ORAL | Status: DC | PRN
  Administered 2013-11-04 – 2013-11-05 (×2): 500 mg via ORAL
  Filled 2013-11-04 (×2): qty 1

## 2013-11-04 MED ORDER — FLEET ENEMA 7-19 GM/118ML RE ENEM: 1.0000 | ENEMA | Freq: Once | RECTAL | Status: AC | PRN

## 2013-11-04 MED ORDER — BUPIVACAINE-EPINEPHRINE (PF) 0.5% -1:200000 IJ SOLN
INTRAMUSCULAR | Status: AC
  Filled 2013-11-04: qty 10

## 2013-11-04 MED ORDER — LIDOCAINE HCL (CARDIAC) 20 MG/ML IV SOLN
INTRAVENOUS | Status: DC | PRN
  Administered 2013-11-04: 50 mg via INTRAVENOUS

## 2013-11-04 MED ORDER — HYDROCHLOROTHIAZIDE 25 MG PO TABS
25.0000 mg | ORAL_TABLET | Freq: Every day | ORAL | Status: DC
  Administered 2013-11-05 – 2013-11-06 (×2): 25 mg via ORAL
  Filled 2013-11-04 (×2): qty 1

## 2013-11-04 MED ORDER — AMLODIPINE BESYLATE 5 MG PO TABS
5.0000 mg | ORAL_TABLET | Freq: Every day | ORAL | Status: DC
  Administered 2013-11-05 – 2013-11-06 (×2): 5 mg via ORAL
  Filled 2013-11-04 (×2): qty 1

## 2013-11-04 MED ORDER — LOSARTAN POTASSIUM 50 MG PO TABS
100.0000 mg | ORAL_TABLET | Freq: Every day | ORAL | Status: DC
  Administered 2013-11-05 – 2013-11-06 (×2): 100 mg via ORAL
  Filled 2013-11-04 (×3): qty 2

## 2013-11-04 MED ORDER — ALUM & MAG HYDROXIDE-SIMETH 200-200-20 MG/5ML PO SUSP: 30.0000 mL | ORAL | Status: DC | PRN

## 2013-11-04 MED ORDER — FENTANYL CITRATE 0.05 MG/ML IJ SOLN
INTRAMUSCULAR | Status: DC | PRN
  Administered 2013-11-04 (×10): 50 ug via INTRAVENOUS

## 2013-11-04 MED ORDER — ACETAMINOPHEN 650 MG RE SUPP: 650.0000 mg | Freq: Four times a day (QID) | RECTAL | Status: DC | PRN

## 2013-11-04 SURGICAL SUPPLY — 56 items
BANDAGE ESMARK 6X9 LF (GAUZE/BANDAGES/DRESSINGS) ×1 IMPLANT
BLADE SAGITTAL 13X1.27X60 (BLADE) ×2 IMPLANT
BLADE SAGITTAL 13X1.27X60MM (BLADE) ×1
BLADE SAW SGTL 83.5X18.5 (BLADE) ×3 IMPLANT
BNDG ELASTIC 6X10 VLCR STRL LF (GAUZE/BANDAGES/DRESSINGS) ×3 IMPLANT
BNDG ESMARK 6X9 LF (GAUZE/BANDAGES/DRESSINGS) ×3
BOWL SMART MIX CTS (DISPOSABLE) ×3 IMPLANT
CAP KNEE CMT PERSONA ×3 IMPLANT
CEMENT BONE SIMPLEX SPEEDSET (Cement) ×6 IMPLANT
COVER SURGICAL LIGHT HANDLE (MISCELLANEOUS) ×3 IMPLANT
CUFF TOURNIQUET SINGLE 34IN LL (TOURNIQUET CUFF) ×3 IMPLANT
DRAPE EXTREMITY T 121X128X90 (DRAPE) ×3 IMPLANT
DRAPE INCISE IOBAN 66X45 STRL (DRAPES) ×6 IMPLANT
DRAPE PROXIMA HALF (DRAPES) ×3 IMPLANT
DRAPE U-SHAPE 47X51 STRL (DRAPES) ×3 IMPLANT
DRSG ADAPTIC 3X8 NADH LF (GAUZE/BANDAGES/DRESSINGS) ×3 IMPLANT
DRSG PAD ABDOMINAL 8X10 ST (GAUZE/BANDAGES/DRESSINGS) ×3 IMPLANT
DURAPREP 26ML APPLICATOR (WOUND CARE) ×6 IMPLANT
ELECT REM PT RETURN 9FT ADLT (ELECTROSURGICAL) ×3
ELECTRODE REM PT RTRN 9FT ADLT (ELECTROSURGICAL) ×1 IMPLANT
EVACUATOR 1/8 PVC DRAIN (DRAIN) ×3 IMPLANT
GLOVE BIOGEL M 7.0 STRL (GLOVE) IMPLANT
GLOVE BIOGEL PI IND STRL 7.5 (GLOVE) IMPLANT
GLOVE BIOGEL PI IND STRL 8.5 (GLOVE) ×2 IMPLANT
GLOVE BIOGEL PI INDICATOR 7.5 (GLOVE)
GLOVE BIOGEL PI INDICATOR 8.5 (GLOVE) ×4
GLOVE SURG ORTHO 8.0 STRL STRW (GLOVE) ×6 IMPLANT
GOWN PREVENTION PLUS XLARGE (GOWN DISPOSABLE) IMPLANT
GOWN STRL NON-REIN LRG LVL3 (GOWN DISPOSABLE) IMPLANT
GOWN STRL REUS W/ TWL XL LVL3 (GOWN DISPOSABLE) ×3 IMPLANT
GOWN STRL REUS W/TWL XL LVL3 (GOWN DISPOSABLE) ×6
HANDPIECE INTERPULSE COAX TIP (DISPOSABLE) ×2
HOOD PEEL AWAY FACE SHEILD DIS (HOOD) ×9 IMPLANT
KIT BASIN OR (CUSTOM PROCEDURE TRAY) ×3 IMPLANT
KIT ROOM TURNOVER OR (KITS) ×3 IMPLANT
MANIFOLD NEPTUNE II (INSTRUMENTS) ×3 IMPLANT
NEEDLE 22X1 1/2 (OR ONLY) (NEEDLE) ×3 IMPLANT
NS IRRIG 1000ML POUR BTL (IV SOLUTION) ×3 IMPLANT
PACK TOTAL JOINT (CUSTOM PROCEDURE TRAY) ×3 IMPLANT
PAD ARMBOARD 7.5X6 YLW CONV (MISCELLANEOUS) ×3 IMPLANT
PADDING CAST COTTON 6X4 STRL (CAST SUPPLIES) ×3 IMPLANT
SET HNDPC FAN SPRY TIP SCT (DISPOSABLE) ×1 IMPLANT
SPONGE GAUZE 4X4 12PLY (GAUZE/BANDAGES/DRESSINGS) ×3 IMPLANT
STAPLER VISISTAT 35W (STAPLE) ×3 IMPLANT
SUCTION FRAZIER TIP 10 FR DISP (SUCTIONS) ×3 IMPLANT
SUT BONE WAX W31G (SUTURE) ×3 IMPLANT
SUT VIC AB 0 CTB1 27 (SUTURE) ×6 IMPLANT
SUT VIC AB 1 CT1 27 (SUTURE) ×6
SUT VIC AB 1 CT1 27XBRD ANBCTR (SUTURE) ×3 IMPLANT
SUT VIC AB 2-0 CT1 27 (SUTURE) ×4
SUT VIC AB 2-0 CT1 TAPERPNT 27 (SUTURE) ×2 IMPLANT
SYR CONTROL 10ML LL (SYRINGE) ×3 IMPLANT
TOWEL OR 17X24 6PK STRL BLUE (TOWEL DISPOSABLE) ×3 IMPLANT
TOWEL OR 17X26 10 PK STRL BLUE (TOWEL DISPOSABLE) ×3 IMPLANT
TRAY FOLEY CATH 14FR (SET/KITS/TRAYS/PACK) IMPLANT
WATER STERILE IRR 1000ML POUR (IV SOLUTION) IMPLANT

## 2013-11-04 NOTE — H&P (Signed)
[name] MRN:  [id number] DOB/SEX:  09-23-1941/female  CHIEF COMPLAINT:  Painful right Knee  HISTORY: Patient is a 73 y.o. female presented with a history of pain in the right knee. Onset of symptoms was gradual starting several years ago with gradually worsening course since that time. Prior procedures on the knee include none. Patient has been treated conservatively with over-the-counter NSAIDs and activity modification. Patient currently rates pain in the knee at 10 out of 10 with activity. There is pain at night.  PAST MEDICAL HISTORY: There are no active problems to display for this patient.  Past Medical History  Diagnosis Date  . PONV (postoperative nausea and vomiting)   . Arthritis   . Shortness of breath     WITH EXERTION   . Hypertension     takes Metoprolol,Amlodipine and Losartan daily  . GERD (gastroesophageal reflux disease)     takes Omeprazole daily  . Redness     to both lower extremities  . Peripheral edema     takes HCTZ daily and Furosemide daily as needed  . Sleep apnea     doesn't use cpap  . History of bronchitis 2014  . Headache(784.0)     occasionally  . Numbness     hands occasionally  . Joint pain   . Joint swelling   . Back pain     reason unknown  . Diarrhea   . History of colon polyps   . Urinary frequency   . Urinary urgency   . Nocturia   . History of blood transfusion     no abnormal reaction noted  . Cataract     bilateral immature  . History of shingles    Past Surgical History  Procedure Laterality Date  . Ankle surgery Left 1993  . Hand surgery Left 2011  . 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 Breast surgery      LEFT SIDE BIOPSY 1989  . Quiadricp  10'21/2012  . Quadriceps tendon repair  07/16/2012    Procedure: REPAIR QUADRICEP TENDON;  Surgeon: Raymon Mutton, MD;  Location[facility] OR;   Service: Orthopedics;  Laterality: Left;  open repair of VMO   . Tubal ligation  1974  . Abdominal hysterectomy  1983  . Vasculitic eruption of legs  1985  . Cholecystectomy  1990  . Anterior cruciate ligament repair Left 2013  . Esophagogastroduodenoscopy    . Colonoscopy       MEDICATIONS:   No prescriptions prior to admission    ALLERGIES:   Allergies  Allergen Reactions  . Adhesive [Tape]     Skin is very sensitive    REVIEW OF SYSTEMS:  Pertinent items are noted in HPI.   FAMILY HISTORY:  No family history on file.  SOCIAL HISTORY:   History  Substance Use Topics  . Smoking status: Never Smoker   . Smokeless tobacco: Never Used  . Alcohol Use: No     EXAMINATION:  Vital signs in last 24 hours:    General appearance: alert, cooperative and no distress Lungs: clear to auscultation bilaterally Heart: regular rate and rhythm, S1, S2 normal, no murmur, click, rub or gallop Abdomen: soft, non-tender; bowel sounds normal; no masses,  no organomegaly Extremities: extremities normal, atraumatic, no cyanosis or edema and Homans sign is negative, no sign of DVT Pulses: 2+ and symmetric Neurologic: Alert  and oriented X 3, normal strength and tone. Normal symmetric reflexes. Normal coordination and gait  Musculoskeletal:  ROM 0-100, Ligaments intact,  Imaging Review Plain radiographs demonstrate severe degenerative joint disease of the right knee. The overall alignment is significant valgus. The bone quality appears to be good for age and reported activity level.  Assessment/Plan: End stage arthritis, right knee   The patient history, physical examination and imaging studies are consistent with advanced degenerative joint disease of the right knee. The patient has failed conservative treatment.  The clearance notes were reviewed.  After discussion with the patient it was felt that Total Knee Replacement was indicated. The procedure,  risks, and benefits of total knee  arthroplasty were presented and reviewed. The risks including but not limited to aseptic loosening, infection, blood clots, vascular injury, stiffness, patella tracking problems complications among others were discussed. The patient acknowledged the explanation, agreed to proceed with the plan.  [name] 11/04/2013, 6:41 AM

## 2013-11-04 NOTE — Plan of Care (Signed)
Problem: Consults Goal: Diagnosis- Total Joint Replacement Primary Total Knee Right     

## 2013-11-04 NOTE — Transfer of Care (Signed)
Immediate Anesthesia Transfer of Care Note  Patient: [name]  Procedure(s) Performed: Procedure(s): RIGHT TOTAL KNEE ARTHROPLASTY (Right)  Patient Location: PACU  Anesthesia Type:General and Regional  Level of Consciousness: awake, alert  and oriented  Airway & Oxygen Therapy: Patient Spontanous Breathing and Patient connected to nasal cannula oxygen  Post-op Assessment: Report given to PACU RN and Post -op Vital signs reviewed and stable  Post vital signs: Reviewed and stable  Complications: No apparent anesthesia complications

## 2013-11-04 NOTE — Anesthesia Preprocedure Evaluation (Addendum)
Anesthesia Evaluation  Patient identified by MRN, date of birth, ID band Patient awake    Reviewed: Allergy & Precautions, H&P , NPO status , Patient's Chart, lab work & pertinent test results, reviewed documented beta blocker date and time   History of Anesthesia Complications (+) PONV and history of anesthetic complications  Airway Mallampati: I TM Distance: >3 FB Neck ROM: Full    Dental  (+) Teeth Intact and Dental Advisory Given   Pulmonary sleep apnea (does not use CPAP) ,  breath sounds clear to auscultation  Pulmonary exam normal       Cardiovascular hypertension, Pt. on medications and Pt. on home beta blockers - anginaRhythm:Regular Rate:Normal     Neuro/Psych negative neurological ROS     GI/Hepatic Neg liver ROS, GERD-  Medicated and Controlled,  Endo/Other  Morbid obesity  Renal/GU negative Renal ROS     Musculoskeletal   Abdominal (+) + obese,   Peds  Hematology negative hematology ROS (+)   Anesthesia Other Findings   Reproductive/Obstetrics                          Anesthesia Physical Anesthesia Plan  ASA: III  Anesthesia Plan: General   Post-op Pain Management:    Induction: Intravenous  Airway Management Planned: LMA  Additional Equipment:   Intra-op Plan:   Post-operative Plan:   Informed Consent: I have reviewed the patients History and Physical, chart, labs and discussed the procedure including the risks, benefits and alternatives for the proposed anesthesia with the patient or authorized representative who has indicated his/her understanding and acceptance.   Dental advisory given  Plan Discussed with: CRNA and Surgeon  Anesthesia Plan Comments: (Plan routine monitors, GA- LMA OK, adductor canal block for post op analgesia)        Anesthesia Quick Evaluation

## 2013-11-04 NOTE — Op Note (Signed)
TOTAL KNEE REPLACEMENT OPERATIVE NOTE:  11/04/2013  2:16 PM  PATIENT:  [name]  73 y.o. female  PRE-OPERATIVE DIAGNOSIS:  osteoarthritis right knee  POST-OPERATIVE DIAGNOSIS:  osteoarthritis right knee  PROCEDURE:  Procedure(s): RIGHT TOTAL KNEE ARTHROPLASTY  SURGEON:  Surgeon(s): Dannielle Huh, MD  PHYSICIAN ASSISTANT: Altamese Cabal, [facility]  ANESTHESIA:   general  DRAINS: Hemovac  SPECIMEN: None  COUNTS:  Correct  TOURNIQUET:   Total Tourniquet Time Documented: Thigh (Right) - 62 minutes Total: Thigh (Right) - 62 minutes   DICTATION:  Indication for procedure:    The patient is a 73 y.o. female who has failed conservative treatment for osteoarthritis right knee.  Informed consent was obtained prior to anesthesia. The risks versus benefits of the operation were explain and in a way the patient can, and did, understand.   On the implant demand matching protocol, this patient scored 10.  Therefore, this patient was not receive a polyethylene insert with vitamin E which is a high demand implant.  Description of procedure:     The patient was taken to the operating room and placed under anesthesia.  The patient was positioned in the usual fashion taking care that all body parts were adequately padded and/or protected.  I foley catheter was not placed.  A tourniquet was applied and the leg prepped and draped in the usual sterile fashion.  The extremity was exsanguinated with the esmarch and tourniquet inflated to 350 mmHg.  Pre-operative range of motion was 0-110.  The knee was in 5 degree of mild varus.  A midline incision approximately 6-7 inches long was made with a #10 blade.  A new blade was used to make a parapatellar arthrotomy going 2-3 cm into the quadriceps tendon, over the patella, and alongside the medial aspect of the patellar tendon.  A synovectomy was then performed with the #10 blade and forceps. I then elevated the deep MCL off the medial tibial metaphysis  subperiosteally around to the semimembranosus attachment.    I everted the patella and used calipers to measure patellar thickness.  I used the reamer to ream down to appropriate thickness to recreate the native thickness.  I then removed excess bone with the rongeur and sagittal saw.  I used the appropriately sized template and drilled the three lug holes.  I then put the trial in place and measured the thickness with the calipers to ensure recreation of the native thickness.  The trial was then removed and the patella subluxed and the knee brought into flexion.  A homan retractor was place to retract and protect the patella and lateral structures.  A Z-retractor was place medially to protect the medial structures.  The extra-medullary alignment system was used to make cut the tibial articular surface perpendicular to the anamotic axis of the tibia and in 3 degrees of posterior slope.  The cut surface and alignment jig was removed.  I then used the intramedullary alignment guide to make a 6 valgus cut on the distal femur.  I then marked out the epicondylar axis on the distal femur.  The posterior condylar axis measured 5 degrees.  I then used the anterior referencing sizer and measured the femur to be a size 9.  The 4-In-1 cutting block was screwed into place in external rotation matching the posterior condylar angle, making our cuts perpendicular to the epicondylar axis.  Anterior, posterior and chamfer cuts were made with the sagittal saw.  The cutting block and cut pieces were removed.  A  lamina spreader was placed in 90 degrees of flexion.  The ACL, PCL, menisci, and posterior condylar osteophytes were removed.  A 10 mm spacer blocked was found to offer good flexion and extension gap balance after minimal in degree releasing.   The scoop retractor was then placed and the femoral finishing block was pinned in place.  The small sagittal saw was used as well as the lug drill to finish the femur.  The block  and cut surfaces were removed and the medullary canal hole filled with autograft bone from the cut pieces.  The tibia was delivered forward in deep flexion and external rotation.  A size E tray was selected and pinned into place centered on the medial 1/3 of the tibial tubercle.  The reamer and keel was used to prepare the tibia through the tray.    I then trialed with the size 9 femur, size E tibia, a 10 mm insert and the 32 patella.  I had excellent flexion/extension gap balance, excellent patella tracking.  Flexion was full and beyond 120 degrees; extension was zero.  These components were chosen and the staff opened them to me on the back table while the knee was lavaged copiously and the cement mixed.  The soft tissue was infiltrated with 60cc of exparel 1.3% through a 21 gauge needle.  I cemented in the components and removed all excess cement.  The polyethylene tibial component was snapped into place and the knee placed in extension while cement was hardening.  The capsule was infilltrated with 30cc of .25% Marcaine with epinephrine.  A hemovac was place in the joint exiting superolaterally.  A pain pump was place superomedially superficial to the arthrotomy.  Once the cement was hard, the tourniquet was let down.  Hemostasis was obtained.  The arthrotomy was closed with figure-8 #1 vicryl sutures.  The deep soft tissues were closed with #0 vicryls and the subcuticular layer closed with a running #2-0 vicryl.  The skin was reapproximated and closed with skin staples.  The wound was dressed with xeroform, 4 x4's, 2 ABD sponges, a single layer of webril and a TED stocking.   The patient was then awakened, extubated, and taken to the recovery room in stable condition.  BLOOD LOSS:  300cc DRAINS: 1 hemovac, 1 pain catheter COMPLICATIONS:  None.  PLAN OF CARE: Admit to inpatient   PATIENT DISPOSITION:  PACU - hemodynamically stable.   Delay start of Pharmacological VTE agent (>24hrs) due to  surgical blood loss or risk of bleeding:  not applicable  Please fax a copy of this op note to my office at [phone] (please only include page 1 and 2 of the Case Information op note)

## 2013-11-04 NOTE — Progress Notes (Signed)
Utilization review completed.  

## 2013-11-04 NOTE — Anesthesia Procedure Notes (Addendum)
Anesthesia Regional Block:  Adductor canal block  Pre-Anesthetic Checklist: ,, timeout performed, Correct Patient, Correct Site, Correct Laterality, Correct Procedure, Correct Position, site marked, Risks and benefits discussed,  Surgical consent,  Pre-op evaluation,  At surgeon's request and post-op pain management  Laterality: Right and Lower  Prep: chloraprep       Needles:   Needle Type: Echogenic Stimulator Needle      Needle Gauge: 22 and 22 G    Additional Needles:  Procedures: ultrasound guided (picture in chart), nerve stimulator and other  (unable to obtain US picture) Adductor canal block  Nerve Stimulator or Paresthesia:  Response: patella twitch,   Additional Responses:   Narrative:  Start time: 11/04/2013 9:58 AM End time: 11/04/2013 10:36 AM Injection made incrementally with aspirations every 5 mL.  Performed by: Personally  Anesthesiologist: Sandford Craze Jackson, MD  Additional Notes: Pt identified in Holding room.  Monitors applied. Working IV access confirmed. Sterile prep R thigh.  #22ga echogenic stimulator needle to twitch at 0.7145mA threshold.  30cc 0.5% Bupivacaine with 1:200k epi injected incrementally after negative test dose.  Patient asymptomatic, VSS, no heme aspirated, tolerated well.  Sandford Craze Jackson, MD   Procedure Name: LMA Insertion Date/Time: 11/04/2013 10:27 AM Performed by: Whitman HeroVITSHTEYN, Virdie Penning Pre-anesthesia Checklist: Patient identified, Emergency Drugs available, Suction available and Patient being monitored Patient Re-evaluated:Patient Re-evaluated prior to inductionOxygen Delivery Method: Circle system utilized Preoxygenation: Pre-oxygenation with 100% oxygen Intubation Type: IV induction Ventilation: Mask ventilation without difficulty LMA: LMA inserted LMA Size: 4.0 Number of attempts: 1 Placement Confirmation: breath sounds checked- equal and bilateral and positive ETCO2 Tube secured with: Tape Dental Injury: Teeth and Oropharynx as per pre-operative  assessment

## 2013-11-04 NOTE — Progress Notes (Signed)
Orthopedic Tech Progress Note Patient Details:  Tabitha CousinGlenda Macbeth 11/14/1940 [id number] CPM applied to Right LE with appropriate settings. OHF applied to bed. Footsie roll provided.  CPM Right Knee CPM Right Knee: On Right Knee Flexion (Degrees): 90 Right Knee Extension (Degrees): 0   Asia R Thompson 11/04/2013, 2:39 PM

## 2013-11-04 NOTE — Anesthesia Postprocedure Evaluation (Signed)
  Anesthesia Post-op Note  Patient: [name]  Procedure(s) Performed: Procedure(s): RIGHT TOTAL KNEE ARTHROPLASTY (Right)  Patient Location: PACU  Anesthesia Type:GA combined with regional for post-op pain  Level of Consciousness: awake, alert , oriented and patient cooperative  Airway and Oxygen Therapy: Patient Spontanous Breathing and Patient connected to nasal cannula oxygen  Post-op Pain: mild  Post-op Assessment: Post-op Vital signs reviewed, Patient's Cardiovascular Status Stable, Respiratory Function Stable, Patent Airway and Pain level controlled, nausea improved  Post-op Vital Signs: Reviewed and stable  Complications: No apparent anesthesia complications

## 2013-11-05 ENCOUNTER — Encounter (HOSPITAL_COMMUNITY): Payer: Self-pay | Admitting: General Practice

## 2013-11-05 LAB — BASIC METABOLIC PANEL
BUN: 13 mg/dL (ref 6–23)
CO2: 25 mEq/L (ref 19–32)
Calcium: 8.2 mg/dL — ABNORMAL LOW (ref 8.4–10.5)
Chloride: 102 mEq/L (ref 96–112)
Creatinine, Ser: 0.81 mg/dL (ref 0.50–1.10)
GFR calc Af Amer: 82 mL/min — ABNORMAL LOW (ref >90)
GFR calc non Af Amer: 70 mL/min — ABNORMAL LOW (ref >90)
Glucose, Bld: 134 mg/dL — ABNORMAL HIGH (ref 70–99)
Potassium: 4.1 mEq/L (ref 3.7–5.3)
Sodium: 141 mEq/L (ref 137–147)

## 2013-11-05 LAB — CBC
HCT: 31.8 % — ABNORMAL LOW (ref 36.0–46.0)
Hemoglobin: 10.1 g/dL — ABNORMAL LOW (ref 12.0–15.0)
MCH: 29.1 pg (ref 26.0–34.0)
MCHC: 31.8 g/dL (ref 30.0–36.0)
MCV: 91.6 fL (ref 78.0–100.0)
Platelets: 211 10*3/uL (ref 150–400)
RBC: 3.47 MIL/uL — ABNORMAL LOW (ref 3.87–5.11)
RDW: 14.6 % (ref 11.5–15.5)
WBC: 11.5 10*3/uL — ABNORMAL HIGH (ref 4.0–10.5)

## 2013-11-05 NOTE — Care Management Note (Signed)
CARE MANAGEMENT NOTE 11/05/2013  [facility]  [name]   Account Number:  [id number]  Date Initiated:  11/05/2013  Documentation initiated by:  Vance PeperBRADY,[name]  Subjective/Objective Assessment:   73 yr old female s/p right total knee arthroplasty.     Action/Plan:   case Manager spoke with patient concerning home health and DME needs. preoperatively setup with [facility],no changes. Patient has rolling walker and 3in1. [facility]   Anticipated DC Date:  11/06/2013   Anticipated DC Plan:  HOME W HOME HEALTH SERVICES      DC Planning Services  CM consult      [facility]  DURABLE MEDICAL EQUIPMENT   Choice offered to / List presented to:  C-1 Patient   DME arranged  CPM      DME agency  [employer]     HH arranged  HH-2 PT      [facility]  [facility]   Status of service:  Completed, signed off

## 2013-11-05 NOTE — Evaluation (Signed)
Physical Therapy Evaluation Patient Details Name: [name] MRN: [id number] DOB: 11/19/1940 Today's Date: 11/05/2013 Time: [id number] PT Time Calculation (min): 46 min  PT Assessment / Plan / Recommendation History of Present Illness  Pt is a 73 y/o female admitted for R TKA.  Pt has had 2 L TKAs.  Clinical Impression  This patient presents with acute pain and decreased functional independence following the above mentioned procedure. At the time of PT eval, pt was able to perform sit to stand transfers with +2 assist. Pt states she always has had difficulty standing up, but used her operative leg mainly for support prior to surgery. This patient is appropriate for skilled PT interventions to address functional limitations, improve safety and independence with functional mobility, and return to PLOF. Plan for afternoon session today; do not feel pt is ready for d/c home this afternoon from a mobility standpoint.      PT Assessment  Patient needs continued PT services    Follow Up Recommendations  Home health PT;Supervision for mobility/OOB    Does the patient have the potential to tolerate intense rehabilitation      Barriers to Discharge        Equipment Recommendations  None recommended by PT    Recommendations for Other Services     Frequency 7X/week    Precautions / Restrictions Precautions Precautions: Fall Precaution Comments: Discussed towel roll under heel and NO pillow under knee. Restrictions Weight Bearing Restrictions: Yes RLE Weight Bearing: Weight bearing as tolerated   Pertinent Vitals/Pain Pt reports moderate pain throughout session, however pushed herself for distance during gait training.       Mobility  Bed Mobility Overal bed mobility: Needs Assistance Bed Mobility: Supine to Sit Supine to sit: Mod assist General bed mobility comments: VC's for sequencing and technique to transition to EOB. Assist for RLE support and movement, as well as trunk  elevation to come to full sit.  Transfers Overall transfer level: Needs assistance Equipment used: Rolling walker (2 wheeled) Transfers: Sit to/from UGI CorporationStand;Stand Pivot Transfers Sit to Stand: Mod assist;Max assist;+2 physical assistance Stand pivot transfers: Min assist General transfer comment: VC's for hand placement on seated surface for safety. Significant assist required for pt to come to full stand. Assist as well for walker movement and placement during [facility].  Ambulation/Gait Ambulation/Gait assistance: Min assist Ambulation Distance (Feet): 15 Feet Assistive device: Rolling walker (2 wheeled) Gait Pattern/deviations: Step-to pattern;Decreased stride length;Trunk flexed Gait velocity: Decreased Gait velocity interpretation: Below normal speed for age/gender General Gait Details: Assist for occasional movement and placement of RW, especially during turns. Min assist required as pt had one episode of knee buckling.    Exercises Total Joint Exercises Ankle Circles/Pumps: 10 reps Quad Sets: 10 reps   PT Diagnosis: Difficulty walking;Acute pain  PT Problem List: Decreased strength;Decreased range of motion;Decreased activity tolerance;Decreased balance;Decreased mobility;Decreased knowledge of use of DME;Decreased safety awareness;Decreased knowledge of precautions;Pain PT Treatment Interventions: DME instruction;Gait training;Stair training;Functional mobility training;Therapeutic activities;Therapeutic exercise;Neuromuscular re-education;Patient/family education     PT Goals(Current goals can be found in the care plan section) Acute Rehab PT Goals Patient Stated Goal: To get stronger PT Goal Formulation: With patient Time For Goal Achievement: 11/19/13 Potential to Achieve Goals: Good  Visit Information  Last PT Received On: 11/05/13 Assistance Needed: +2 History of Present Illness: Pt admitted for R TKA.  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       Prior Functioning  Home  Living Family/patient expects to be discharged  to:: Private residence Living Arrangements: Spouse/significant other Available Help at Discharge: Friend(s);Available 24 hours/day Type of Home: House Home Access: Level entry Home Layout: Two level Alternate Level Stairs-Number of Steps: 3 Alternate Level Stairs-Rails: Can reach both Home Equipment: Bedside commode;Walker - 2 wheels;Cane - single point;Shower seat - built in (walk-in shower) Prior Function Level of Independence: Independent Communication Communication: No difficulties Dominant Hand: Right    Cognition  Cognition Arousal/Alertness: Awake/alert Behavior During Therapy: WFL for tasks assessed/performed Overall Cognitive Status: Within Functional Limits for tasks assessed    Extremity/Trunk Assessment Upper Extremity Assessment Upper Extremity Assessment: Defer to OT evaluation Lower Extremity Assessment Lower Extremity Assessment: RLE deficits/detail RLE Deficits / Details: Decreased strength and AROM consistent with TKA RLE: Unable to fully assess due to pain Cervical / Trunk Assessment Cervical / Trunk Assessment: Kyphotic   Balance Balance Overall balance assessment: Needs assistance Sitting-balance support: Feet supported;Bilateral upper extremity supported Sitting balance-Leahy Scale: Good Standing balance support: Bilateral upper extremity supported Standing balance-Leahy Scale: Fair Standing balance comment: Occasional knee buckling on R.   End of Session PT - End of Session Equipment Utilized During Treatment: Gait belt Activity Tolerance: Patient tolerated treatment well Patient left: in chair;with call bell/phone within reach Nurse Communication: Mobility status CPM Right Knee CPM Right Knee: Off Right Knee Flexion (Degrees): 90 Right Knee Extension (Degrees): 0 Additional Comments: completed 2 hours  GP     [name] 11/05/2013, 12:21 PM  [name], PT, DPT [phone]

## 2013-11-05 NOTE — Discharge Instructions (Signed)
Diet: As you were doing prior to hospitalization  ° °Activity:  Increase activity slowly as tolerated  °                No lifting or driving for 6 weeks ° °Shower:  May shower without a dressing once there is no drainage from your wound. °                Do NOT wash over the wound. °                °Dressing:  You may change your dressing on Wednesday °                   Then change the dressing daily with sterile 4"x4"s gauze dressing  °                   And TED hose for knees. ° °Weight Bearing:  Weight bearing as tolerated as taught in physical therapy.  Use a                                walker or Crutches as instructed. ° °To prevent constipation: you may use a stool softener such as - °              Colace ( over the counter) 100 mg by mouth twice a day  °              Drink plenty of fluids ( prune juice may be helpful) and high fiber foods °               Miralax ( over the counter) for constipation as needed.   ° °Precautions:  If you experience chest pain or shortness of breath - call 911 immediately               For transfer to the hospital emergency department!! °              If you develop a fever greater that 101 F, purulent drainage from wound,                             increased redness or drainage from wound, or calf pain -- Call the office. ° °Follow- Up Appointment:  Please call for an appointment to be seen on 11/19/13 °                                             [place] office:  [phone] °           [address] [place], [place] [address] °               ° ° °

## 2013-11-05 NOTE — Evaluation (Signed)
Occupational Therapy Evaluation Patient Details Name: Tabitha CousinGlenda Galloway MRN: [id number] DOB: 06/27/1941 Today's Date: 11/05/2013 Time: 1129-1207 OT Time Calculation (min): 38 min  OT Assessment / Plan / Recommendation History of present illness Pt admitted for R TKA.  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   Clinical Impression   Pt admitted with the above diagnosis and has the deficits listed below.  Pt currently requiring a great amount of assist coming sit to stand.  Pt would benefit from cont OT to increase functional mobility and I with adls so she can d/c home with her family.      OT Assessment  Patient needs continued OT Services    Follow Up Recommendations  Home health OT;Supervision/Assistance - 24 hour    Barriers to Discharge      Equipment Recommendations  None recommended by OT    Recommendations for Other Services    Frequency  Min 3X/week    Precautions / Restrictions Precautions Precautions: Fall Precaution Comments: Discussed towel roll under heel and NO pillow under knee. Restrictions Weight Bearing Restrictions: Yes RLE Weight Bearing: Weight bearing as tolerated   Pertinent Vitals/Pain Pt with stable stats and 94% O2 sats on RA.    ADL  Eating/Feeding: Performed;Independent Where Assessed - Eating/Feeding: Chair Grooming: Performed;Set up Where Assessed - Grooming: Unsupported sitting Upper Body Bathing: Performed;Set up Where Assessed - Upper Body Bathing: Unsupported sitting Lower Body Bathing: Simulated;Maximal assistance Where Assessed - Lower Body Bathing: Supported sit to stand Upper Body Dressing: Performed;Set up Where Assessed - Upper Body Dressing: Unsupported sitting Lower Body Dressing: Performed;Maximal assistance Where Assessed - Lower Body Dressing: Supported sit to stand Toilet Transfer: Performed;Maximal assistance Toilet Transfer Method: Surveyor, mineralstand pivot Toilet Transfer Equipment: Materials engineerBedside commode Toileting - Clothing Manipulation and Hygiene:  Performed;Set up Where Assessed - Toileting Clothing Manipulation and Hygiene: Standing Equipment Used: Rolling walker Transfers/Ambulation Related to ADLs: Pt transferred sit to stand x2.  On first attempt, pt required +2 assist due to low chair.  [facility] higher, pt required +1 mod assist. ADL Comments: Pt did well with adls.  This is not her first knee replacement so she was fairly familiar with the adl routine.  Pt has a difficult time reaching her LEs during adls.      OT Diagnosis: Generalized weakness;Acute pain  OT Problem List: Decreased strength;Decreased activity tolerance;Impaired balance (sitting and/or standing);Decreased knowledge of use of DME or AE;Pain OT Treatment Interventions: Self-care/ADL training;DME and/or AE instruction;Therapeutic activities   OT Goals(Current goals can be found in the care plan section) Acute Rehab OT Goals Patient Stated Goal: to be I again. OT Goal Formulation: With patient Time For Goal Achievement: 11/19/13 Potential to Achieve Goals: Good ADL Goals Pt Will Perform Grooming: with supervision;standing Pt Will Perform Lower Body Bathing: with supervision;sit to/from stand Pt Will Perform Lower Body Dressing: with min assist;sit to/from stand Pt Will Perform Tub/Shower Transfer: Shower transfer;with min guard assist;rolling walker;shower seat Additional ADL Goal #1: pt will do all toileting on 3:1 over commode with S.  Visit Information  Last OT Received On: 11/05/13 Assistance Needed: +2 History of Present Illness: Pt admitted for R TKA.  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       Prior Functioning     Home Living Family/patient expects to be discharged to:: Private residence Living Arrangements: Spouse/significant other Available Help at Discharge: Friend(s);Available 24 hours/day Type of Home: House Home Access: Level entry Home Layout: Two level Alternate Level Stairs-Number of Steps: 3 Alternate Level  Stairs-Rails: Can reach  both Home Equipment: Bedside commode;Walker - 2 wheels;Cane - single point;Shower seat - built in Prior Function Level of Independence: Independent Comments: works at [employer]: No difficulties Dominant Hand: Right         Vision/Perception     Cognition  Cognition Arousal/Alertness: Awake/alert Behavior During Therapy: WFL for tasks assessed/performed Overall Cognitive Status: Within Functional Limits for tasks assessed    Extremity/Trunk Assessment Upper Extremity Assessment Upper Extremity Assessment: Generalized weakness Lower Extremity Assessment Lower Extremity Assessment: Defer to PT evaluation RLE Deficits / Details: Decreased strength and AROM consistent with TKA RLE: Unable to fully assess due to pain Cervical / Trunk Assessment Cervical / Trunk Assessment: Kyphotic     Mobility Bed Mobility Overal bed mobility: Needs Assistance Bed Mobility: Supine to Sit Supine to sit: Mod assist General bed mobility comments: VC's for sequencing and technique to transition to EOB. Assist for RLE support and movement, as well as trunk elevation to come to full sit.  Transfers Overall transfer level: Needs assistance Equipment used: Rolling walker (2 wheeled) Transfers: Sit to/from [employer] Sit to Stand: Mod assist;Max assist;+2 physical assistance Stand pivot transfers: Min assist General transfer comment: Pt requires a great amount of assist to come into standing.  Talked to her at length about bringing her nose over her toes when standing.  From a higher surface, pt was more successful.     Exercise Total Joint Exercises Ankle Circles/Pumps: 10 reps Quad Sets: 10 reps   Balance Balance Overall balance assessment: Needs assistance Sitting-balance support: Feet supported;Bilateral upper extremity supported Sitting balance-Leahy Scale: Good Standing balance support: Bilateral upper extremity supported;During functional  activity Standing balance-Leahy Scale: Poor Standing balance comment: pt must have walker for balance.   End of Session CPM Right Knee CPM Right Knee: Off Right Knee Flexion (Degrees): 90 Right Knee Extension (Degrees): 0 Additional Comments: completed 2 hours  GO     Hope Budds 11/05/2013, 12:28 PM [phone]

## 2013-11-05 NOTE — Progress Notes (Signed)
Physical Therapy Treatment Patient Details Name: [name] MRN: [id number] DOB: 1940/11/05 Today's Date: 11/05/2013 Time: 1430-1500 PT Time Calculation (min): 30 min  PT Assessment / Plan / Recommendation  History of Present Illness Pt admitted for R TKA.  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   PT Comments   Pt progressing towards physical therapy goals. Pt requires heavy +2 assist to perform transfer to standing, however once she is up, she ambulates fairly well. Spoke with pt and son regarding safety at home, and if pt  discharges tomorrow then PT will definitely see her BID prior to her leaving.   Follow Up Recommendations  Home health PT;Supervision for mobility/OOB     Does the patient have the potential to tolerate intense rehabilitation     Barriers to Discharge        Equipment Recommendations  None recommended by PT    Recommendations for Other Services    Frequency 7X/week   Progress towards PT Goals Progress towards PT goals: Progressing toward goals  Plan Current plan remains appropriate    Precautions / Restrictions Precautions Precautions: Fall;Knee Precaution Comments: Discussed towel roll under heel and NO pillow under knee. Restrictions Weight Bearing Restrictions: Yes RLE Weight Bearing: Weight bearing as tolerated   Pertinent Vitals/Pain Pt reports minimal pain throughout session, and no pain at rest.     Mobility  Bed Mobility Overal bed mobility: Needs Assistance Bed Mobility: Sit to Supine Sit to supine: Mod assist General bed mobility comments: VC's for sequencing and technique. Assist for LE elevation into bed. Transfers Overall transfer level: Needs assistance Equipment used: Rolling walker (2 wheeled) Transfers: Sit to/from Stand Sit to Stand: Max assist;+2 physical assistance General transfer comment: +2 max assist required for sit>stand from recliner. 3 attempts before pt successfully came to full standing. VC's required for hand placement and for L foot  placement back towards chair prior to initiating transfers.  Ambulation/Gait Ambulation/Gait assistance: Min guard Ambulation Distance (Feet): 65 Feet Assistive device: Rolling walker (2 wheeled) Gait Pattern/deviations: Step-to pattern;Decreased stride length;Trunk flexed Gait velocity: Decreased Gait velocity interpretation: Below normal speed for age/gender General Gait Details: Pt cued for sequencing and safety awareness with the RW as well as increased heel strike.    Exercises Total Joint Exercises Ankle Circles/Pumps: 10 reps Quad Sets: 10 reps Heel Slides: 10 reps Hip ABduction/ADduction: 10 reps Straight Leg Raises: 10 reps;AAROM   PT Diagnosis:    PT Problem List:   PT Treatment Interventions:     PT Goals (current goals can now be found in the care plan section) Acute Rehab PT Goals Patient Stated Goal: To get strong enough to be safe at home PT Goal Formulation: With patient Time For Goal Achievement: 11/19/13 Potential to Achieve Goals: Good  Visit Information  Last PT Received On: 11/05/13 Assistance Needed: +2 History of Present Illness: Pt admitted for R TKA.  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    Subjective Data  Subjective: "I've been sitting up since you left." Patient Stated Goal: To get strong enough to be safe at home   Cognition  Cognition Arousal/Alertness: Awake/alert Behavior During Therapy: WFL for tasks assessed/performed Overall Cognitive Status: Within Functional Limits for tasks assessed    Balance  Balance Overall balance assessment: Needs assistance Sitting-balance support: Feet supported;Bilateral upper extremity supported Sitting balance-Leahy Scale: Good Standing balance support: Bilateral upper extremity supported Standing balance-Leahy Scale: Fair  End of Session PT - End of Session Equipment Utilized During Treatment: Gait belt Activity Tolerance:  Patient tolerated treatment well Patient left: in chair;with call bell/phone within  reach Nurse Communication: Mobility status   GP     Ruthann CancerHamilton, Bernardette Waldron 11/05/2013, 4:55 PM  Ruthann CancerLaura Hamilton, PT, DPT [phone]

## 2013-11-05 NOTE — Progress Notes (Signed)
SPORTS MEDICINE AND JOINT REPLACEMENT  Georgena Spurling, MD   Altamese Cabal, PA-C [address] [place], [place], [place]  [address]                             [phone]   PROGRESS NOTE  Subjective:  negative for Chest Pain  negative for Shortness of Breath  negative for Nausea/Vomiting   negative for Calf Pain  negative for Bowel Movement   Tolerating Diet: yes         Patient reports pain as 5 on 0-10 scale.    Objective: Vital signs in last 24 hours:   Patient Vitals for the past 24 hrs:  BP Temp Temp src Pulse Resp SpO2  11/05/13 0529 122/45 mmHg 98.6 F (37 C) - 74 18 97 %  11/05/13 0144 151/53 mmHg 99.5 F (37.5 C) - 80 18 98 %  11/04/13 2106 150/48 mmHg 99.1 F (37.3 C) - 86 16 100 %  11/04/13 1510 142/60 mmHg 98.1 F (36.7 C) - 81 14 96 %  11/04/13 1445 157/63 mmHg 97.7 F (36.5 C) - 73 16 99 %  11/04/13 1430 158/70 mmHg - - 72 17 100 %  11/04/13 1415 144/111 mmHg - - 71 15 100 %  11/04/13 1400 145/75 mmHg - - 78 22 100 %  11/04/13 1345 160/62 mmHg - - 73 21 100 %  11/04/13 1330 149/81 mmHg - - 78 20 99 %  11/04/13 1315 165/72 mmHg - - 86 15 94 %  11/04/13 1250 153/61 mmHg 97.4 F (36.3 C) - 85 - 97 %  11/04/13 1015 - - - 77 19 99 %  11/04/13 1010 117/58 mmHg - - 76 20 99 %  11/04/13 1005 117/45 mmHg - - 73 21 98 %  11/04/13 1000 113/45 mmHg - - 71 22 96 %  11/04/13 0955 126/48 mmHg - - 70 16 92 %  11/04/13 0950 148/46 mmHg - - 71 15 91 %  11/04/13 0945 164/66 mmHg - - 76 17 100 %  11/04/13 0940 - - - 74 15 100 %  11/04/13 0935 - - - 73 14 100 %  11/04/13 0813 175/61 mmHg 98.4 F (36.9 C) Oral 66 20 98 %    @flow {1959:LAST@   Intake/Output from previous day:   02/09 0701 - 02/10 0700 In: 3310 [P.O.:960; I.V.:2300] Out: 775 [Urine:200; Drains:575]   Intake/Output this shift:       Intake/Output     02/09 0701 - 02/10 0700 02/10 0701 - 02/11 0700   P.O. 960    I.V. 2300    IV Piggyback 50    Total Intake 3310     Urine 200    Drains 575    Total Output 775     Net +2535          Urine Occurrence 3 x       LABORATORY DATA:  Recent Labs  11/04/13 1530 11/05/13 0502  WBC 13.8* 11.5*  HGB 11.5* 10.1*  HCT 35.6* 31.8*  PLT 221 211    Recent Labs  11/04/13 1530 11/05/13 0502  NA  --  141  K  --  4.1  CL  --  102  CO2  --  25  BUN  --  13  CREATININE 0.82 0.81  GLUCOSE  --  134*  CALCIUM  --  8.2*   Lab Results  Component Value Date   INR 0.98  10/22/2013   INR 0.92 02/07/2012    Examination:  General appearance: alert, cooperative and no distress Extremities: Homans sign is negative, no sign of DVT  Wound Exam: clean, dry, intact   Drainage:  None: wound tissue dry  Motor Exam: EHL and FHL Intact  Sensory Exam: Deep Peroneal normal   Assessment:    1 Day Post-Op  Procedure(s) (LRB): RIGHT TOTAL KNEE ARTHROPLASTY (Right)  ADDITIONAL DIAGNOSIS:  Active Problems:   S/P total knee arthroplasty  Acute Blood Loss Anemia   Plan: Physical Therapy as ordered Weight Bearing as Tolerated (WBAT)  DVT Prophylaxis:  Lovenox  DISCHARGE PLAN: Home  DISCHARGE NEEDS: HHPT, CPM, Walker and 3-in-1 comode seat         [name] 11/05/2013, 7:41 AM

## 2013-11-06 LAB — CBC
HCT: 28.1 % — ABNORMAL LOW (ref 36.0–46.0)
Hemoglobin: 9 g/dL — ABNORMAL LOW (ref 12.0–15.0)
MCH: 29.3 pg (ref 26.0–34.0)
MCHC: 32 g/dL (ref 30.0–36.0)
MCV: 91.5 fL (ref 78.0–100.0)
Platelets: 187 10*3/uL (ref 150–400)
RBC: 3.07 MIL/uL — ABNORMAL LOW (ref 3.87–5.11)
RDW: 15 % (ref 11.5–15.5)
WBC: 11 10*3/uL — ABNORMAL HIGH (ref 4.0–10.5)

## 2013-11-06 MED ORDER — METHOCARBAMOL 500 MG PO TABS: 500.0000 mg | ORAL_TABLET | Freq: Four times a day (QID) | ORAL | Status: DC | PRN

## 2013-11-06 MED ORDER — OXYCODONE HCL 5 MG PO TABS: 5.0000 mg | ORAL_TABLET | ORAL | Status: DC | PRN

## 2013-11-06 MED ORDER — ENOXAPARIN SODIUM 40 MG/0.4ML ~~LOC~~ SOLN: 40.0000 mg | SUBCUTANEOUS | Status: DC

## 2013-11-06 MED ORDER — OXYCODONE HCL ER 10 MG PO T12A: 10.0000 mg | EXTENDED_RELEASE_TABLET | Freq: Two times a day (BID) | ORAL | Status: DC

## 2013-11-06 NOTE — Progress Notes (Signed)
SPORTS MEDICINE AND JOINT REPLACEMENT  Georgena SpurlingStephen Lucey, MD   Altamese CabalMaurice Welles Walthall, PA-C [address], [place], [place]  [id number]                             [phone]   PROGRESS NOTE  Subjective:  negative for Chest Pain  negative for Shortness of Breath  negative for Nausea/Vomiting   negative for Calf Pain  negative for Bowel Movement   Tolerating Diet: yes         Patient reports pain as 3 on 0-10 scale.    Objective: Vital signs in last 24 hours:   Patient Vitals for the past 24 hrs:  BP Temp Pulse Resp SpO2  11/06/13 0435 127/44 mmHg 98.1 F (36.7 C) 82 18 95 %  11/05/13 2116 136/45 mmHg 98.6 F (37 C) 81 18 94 %  11/05/13 2000 - - - 16 94 %  11/05/13 1424 113/43 mmHg 98.1 F (36.7 C) 77 16 92 %    @flow {1959:LAST@   Intake/Output from previous day:   02/10 0701 - 02/11 0700 In: 1493.3 [P.O.:1440; I.V.:53.3] Out: 350 [Drains:350]   Intake/Output this shift:       Intake/Output     02/10 0701 - 02/11 0700 02/11 0701 - 02/12 0700   P.O. 1440    I.V. 53.3    IV Piggyback     Total Intake 1493.3     Urine     Drains 350    Total Output 350     Net +1143.3          Urine Occurrence 4 x       LABORATORY DATA:  Recent Labs  11/04/13 1530 11/05/13 0502 11/06/13 0347  WBC 13.8* 11.5* 11.0*  HGB 11.5* 10.1* 9.0*  HCT 35.6* 31.8* 28.1*  PLT 221 211 187    Recent Labs  11/04/13 1530 11/05/13 0502  NA  --  141  K  --  4.1  CL  --  102  CO2  --  25  BUN  --  13  CREATININE 0.82 0.81  GLUCOSE  --  134*  CALCIUM  --  8.2*   Lab Results  Component Value Date   INR 0.98 10/22/2013   INR 0.92 02/07/2012    Examination:  General appearance: alert, cooperative and no distress Extremities: extremities normal, atraumatic, no cyanosis or edema and Homans sign is negative, no sign of DVT  Wound Exam: clean, dry, intact   Drainage:  None: wound tissue dry  Motor Exam: EHL and FHL Intact  Sensory Exam: Deep Peroneal normal   Assessment:     2 Days Post-Op  Procedure(s) (LRB): RIGHT TOTAL KNEE ARTHROPLASTY (Right)  ADDITIONAL DIAGNOSIS:  Active Problems:   S/P total knee arthroplasty  Acute Blood Loss Anemia   Plan: Physical Therapy as ordered Weight Bearing as Tolerated (WBAT)  DVT Prophylaxis:  Lovenox  DISCHARGE PLAN: Home  DISCHARGE NEEDS: HHPT, CPM, Walker and 3-in-1 comode seat         [name] 11/06/2013, 8:33 AM

## 2013-11-06 NOTE — Progress Notes (Signed)
D/c instructions review completed with pt, cozaar was clarified by Everlean AlstromMaurice in person with pt and RN, pt will be taking med at home daily as before. Everlean AlstromMaurice noted on pt's printed d/c instructions (EPIC did not allow him to select resume he stated), pt verbalizes understanding to take medication. Pt given copy of instructions, and scripts. Pt will participate with therapy this afternoon and her son will be coming to pick her up.

## 2013-11-06 NOTE — Progress Notes (Signed)
Physical Therapy Treatment Patient Details Name: [name] MRN: [id number] DOB: 1940/12/25 Today's Date: 11/06/2013 Time: 1129-1202 PT Time Calculation (min): 33 min  PT Assessment / Plan / Recommendation  History of Present Illness Pt admitted for R TKA.  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   PT Comments   Pt continues to require significant amount of assistance for bed mobility and sit>stand transfers. Pt reports that her furniture at home is higher than the furniture here and she will not have a problem getting up and down. Tech raised recliner height up with blankets and pt still had difficulty coming to full stand without mod assist (at the very least). Will continue to work on mobility and transfers during afternoon session prior to d/c.   Follow Up Recommendations  Home health PT;Supervision for mobility/OOB     Does the patient have the potential to tolerate intense rehabilitation     Barriers to Discharge        Equipment Recommendations  None recommended by PT    Recommendations for Other Services    Frequency 7X/week   Progress towards PT Goals Progress towards PT goals: Progressing toward goals  Plan Current plan remains appropriate    Precautions / Restrictions Precautions Precautions: Fall;Knee Precaution Comments: Discussed towel roll under heel and NO pillow under knee. Restrictions Weight Bearing Restrictions: Yes RLE Weight Bearing: Weight bearing as tolerated   Pertinent Vitals/Pain 7/10 after ambulation. Pt positioned in footsie roll at end of session in recliner and pt reports this is tolerable.    Mobility  Bed Mobility Overal bed mobility: Needs Assistance Bed Mobility: Supine to Sit Supine to sit: Mod assist Sit to supine: Mod assist General bed mobility comments: VC's for sequencing and technique, trying not to use the bedrails to simulate home environment. Pt asks for therapist's assistance to pull up instead of pushing up from the bed as cued.   Transfers Overall transfer level: Needs assistance Equipment used: Rolling walker (2 wheeled) Transfers: Sit to/from Stand Sit to Stand: +2 physical assistance;Mod assist Stand pivot transfers: Mod assist;Max assist General transfer comment: VC's for hand placement on seated surface for safety. Elevated bed surface to simulate home environment, however continued to require +2 assist to stand safely. Ambulation/Gait Ambulation/Gait assistance: Min guard Ambulation Distance (Feet): 75 Feet Assistive device: Rolling walker (2 wheeled) Gait Pattern/deviations: Step-through pattern;Decreased stride length;Trunk flexed Gait velocity: Decreased Gait velocity interpretation: Below normal speed for age/gender General Gait Details: VC's for sequencing and technique, with emphasis on improved posture, increased heel strike, and step-through gait pattern. ` Stairs: Yes Stairs assistance: Min assist Stair Management: Two rails;Step to pattern;Forwards Number of Stairs: 5 (x2) General stair comments: VC's for increased quad activation as pt reports her knee feels as if it is going to buckle.     Exercises Total Joint Exercises Ankle Circles/Pumps: 10 reps Quad Sets: 10 reps Heel Slides: 10 reps Long Arc Quad: 10 reps Goniometric ROM: 74 AAROM   PT Diagnosis:    PT Problem List:   PT Treatment Interventions:     PT Goals (current goals can now be found in the care plan section) Acute Rehab PT Goals Patient Stated Goal: To get strong enough to be safe at home PT Goal Formulation: With patient Time For Goal Achievement: 11/19/13 Potential to Achieve Goals: Good  Visit Information  Last PT Received On: 11/06/13 Assistance Needed: +2 History of Present Illness: Pt admitted for R TKA.  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  Subjective Data  Subjective: "I think I'm better today." Patient Stated Goal: To get strong enough to be safe at home   Cognition  Cognition Arousal/Alertness:  Awake/alert Behavior During Therapy: WFL for tasks assessed/performed Overall Cognitive Status: Within Functional Limits for tasks assessed    Balance  Balance Overall balance assessment: Needs assistance Sitting-balance support: Feet supported;Bilateral upper extremity supported Sitting balance-Leahy Scale: Good Standing balance support: Bilateral upper extremity supported Standing balance-Leahy Scale: Fair  End of Session PT - End of Session Equipment Utilized During Treatment: Gait belt Activity Tolerance: Patient tolerated treatment well Patient left: in chair;with call bell/phone within reach Nurse Communication: Mobility status   GP     [name], [name] 11/06/2013, 1:00 PM  Tabitha CancerLaura Galloway, PT, DPT [phone]

## 2013-11-06 NOTE — Progress Notes (Signed)
Pt d/c'd via wheelchair with belongings with son, escorted by unit staff.

## 2013-11-06 NOTE — Progress Notes (Signed)
Physical Therapy Treatment Patient Details Name: [name] MRN: [id number] DOB: 08-24-1941 Today's Date: 11/06/2013 Time: 1478-2956 PT Time Calculation (min): 26 min  PT Assessment / Plan / Recommendation  History of Present Illness Pt admitted for R TKA.  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   PT Comments   Pt progressing well towards physical therapy goals. Pt continues to require a fair amount of assistance to achieve full sit>stand, however is showing improvement. Discussed HEP, car transfer, and stair negotiation. Pt reports no further questions and anticipates d/c this afternoon.   Follow Up Recommendations  Home health PT;Supervision for mobility/OOB     Does the patient have the potential to tolerate intense rehabilitation     Barriers to Discharge        Equipment Recommendations  None recommended by PT    Recommendations for Other Services    Frequency 7X/week   Progress towards PT Goals Progress towards PT goals: Progressing toward goals  Plan Current plan remains appropriate    Precautions / Restrictions Precautions Precautions: Fall;Knee Precaution Comments: Discussed towel roll under heel and NO pillow under knee. Restrictions Weight Bearing Restrictions: Yes RLE Weight Bearing: Weight bearing as tolerated   Pertinent Vitals/Pain Pt reports minimal pain throughout session.     Mobility  Bed Mobility Overal bed mobility: Needs Assistance Bed Mobility: Supine to Sit Supine to sit: Mod assist General bed mobility comments: Pt received sitting up in bed.  Transfers Overall transfer level: Needs assistance Equipment used: Rolling walker (2 wheeled) Transfers: Sit to/from Stand Sit to Stand: +2 physical assistance;Min assist General transfer comment: VC's for hand placement on seated surface for safety. Elevated bed surface to simulate home environment. +2 assist available for safety, however pt required much less assistance to achieve full standing this  session. Ambulation/Gait Ambulation/Gait assistance: Min guard Ambulation Distance (Feet): 100 Feet Assistive device: Rolling walker (2 wheeled) Gait Pattern/deviations: Step-through pattern;Decreased stride length;Trunk flexed Gait velocity: Decreased Gait velocity interpretation: Below normal speed for age/gender General Gait Details: VC's for sequencing and technique, with emphasis on improved posture, increased heel strike, and step-through gait pattern.  Stairs: Yes Stairs assistance: Min assist Stair Management: Two rails Number of Stairs: 5 General stair comments: VC's for increased quad activation as pt reports her knee feels as if it is going to buckle.     Exercises Total Joint Exercises Ankle Circles/Pumps: 10 reps Quad Sets: 10 reps Heel Slides: 10 reps Long Arc Quad: 10 reps Goniometric ROM: 74 AAROM Other Exercises Other Exercises: Issued HEP handout and discussed exercise technique and frequency.    PT Diagnosis:    PT Problem List:   PT Treatment Interventions:     PT Goals (current goals can now be found in the care plan section) Acute Rehab PT Goals Patient Stated Goal: To get strong enough to be safe at home PT Goal Formulation: With patient Time For Goal Achievement: 11/19/13 Potential to Achieve Goals: Good  Visit Information  Last PT Received On: 11/06/13 Assistance Needed: +2 History of Present Illness: Pt admitted for R TKA.  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    Subjective Data  Subjective: "I'm ready to go home" Patient Stated Goal: To get strong enough to be safe at home   Cognition  Cognition Arousal/Alertness: Awake/alert Behavior During Therapy: WFL for tasks assessed/performed Overall Cognitive Status: Within Functional Limits for tasks assessed    Balance  Balance Overall balance assessment: Needs assistance Sitting-balance support: Feet supported;Bilateral upper extremity supported Sitting balance-Leahy Scale:  Good Standing balance support:  Bilateral upper extremity supported Standing balance-Leahy Scale: Fair  End of Session PT - End of Session Equipment Utilized During Treatment: Gait belt Activity Tolerance: Patient tolerated treatment well Patient left: in chair;with call bell/phone within reach Nurse Communication: Mobility status   GP     Ruthann CancerHamilton, Nychelle Cassata 11/06/2013, 3:54 PM  Ruthann CancerLaura Hamilton, PT, DPT [phone]

## 2013-11-06 NOTE — Progress Notes (Signed)
Occupational Therapy Treatment Patient Details Name: Charletta CousinGlenda Whitehouse MRN: [id number] DOB: 04/09/1941 Today's Date: 11/06/2013 Time: [id number] OT Time Calculation (min): 29 min  OT Assessment / Plan / Recommendation  History of present illness Pt admitted for R TKA.  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   OT comments  Pt. Still requiring assistance for all bed mobility, sit/stand, and all components of LB self care.  Reports her sig. Other helped before and can help her again.  We will need to make sure prior to d/c home secondary to the level of assist she is requiring.  She is also notably frustrated when inst. And recommended tech. Are provided which limits carry over with task and safety.  Pt. Aware and states "i'm just used to doing what i want to do"  Follow Up Recommendations  Supervision/Assistance - 24 hour;Home health OT , need to know that sig. Other is able to provide the assistance she is stating he can                     Frequency Min 3X/week   Progress towards OT Goals Progress towards OT goals: Progressing toward goals  Plan Discharge plan remains appropriate    Precautions / Restrictions Precautions Precautions: Fall;Knee Precaution Comments: Discussed towel roll under heel and NO pillow under knee. Restrictions RLE Weight Bearing: Weight bearing as tolerated   Pertinent Vitals/Pain Did not rate but complained and moaned throughout comparing issues with each knee    ADL  Grooming: Performed;Wash/dry hands;Wash/dry face;Teeth care;Applying makeup;Brushing hair Where Assessed - Grooming: Supported standing;Unsupported standing Lower Body Bathing: Performed;Minimal assistance Where Assessed - Lower Body Bathing: Supported standing Upper Body Dressing: Performed;Supervision/safety Where Assessed - Upper Body Dressing: Supported standing Lower Body Dressing: Performed;Maximal assistance Where Assessed - Lower Body Dressing: Supported sit to Pharmacist, hospitalstand Toilet Transfer: Performed;Moderate  assistance Toilet Transfer Method: Sit to stand Toilet Transfer Equipment: Grab bars;Raised toilet seat with arms (or 3-in-1 over toilet) Toileting - Clothing Manipulation and Hygiene: Performed;Minimal assistance;Moderate assistance Where Assessed - Toileting Clothing Manipulation and Hygiene: Standing Transfers/Ambulation Related to ADLs: varying levels of assist with sit/stand throughout session.  pt. requried multiple attepts. in b.room and sites LLE as the reason for her troubles.  "cant you see that knee sliding around i know you can see it".  min/mod a with difficulty assisting secondary to pt. moods and demands ADL Comments: LB dressing max a sit/stand (don underwear and incontinence pads) reports her sig. other helped prior to and will con't. to assist at d/c.  grooming ub/dressing s in standing.  Lb bathing also required assistance and was very difficult to assist with consistency secondary to pt. moods    OT Goals(current goals can now be found in the care plan section)    Visit Information  Last OT Received On: 11/06/13 History of Present Illness: Pt admitted for R TKA.  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    Subjective Data   "go ahead and pull me up now, move my leg i cant, i get stuck move, no come over here" (during oob and sit/stand)          Cognition  Cognition Arousal/Alertness: Awake/alert Behavior During Therapy: Agitated    Mobility  Bed Mobility Overal bed mobility: Needs Assistance Bed Mobility: Supine to Sit Supine to sit: Mod assist Sit to supine: Mod assist General bed mobility comments: requests mod/max a for movement of RLE, "my sig. other helps me so go ahead and pull me up" Transfers  Overall transfer level: Needs assistance Equipment used: Rolling walker (2 wheeled) Transfers: Sit to/from Stand Sit to Stand: Mod assist;Max assist Stand pivot transfers: Mod assist;Max assist General transfer comment: max cues for hand placement and tech.  also educated on sliding  RLE forward when she begins to sit down.  pt. with fluctuating levels of frustration that would interfere wtih instruction.  tried to calm pt. so she could follow inst. for safe transfer tech.               End of Session OT - End of Session Equipment Utilized During Treatment: Rolling walker Activity Tolerance: Patient tolerated treatment well Patient left: in bed;with call bell/phone within reach       Robet Leu, COTA/L 11/06/2013, 10:40 AM

## 2013-11-06 NOTE — Discharge Summary (Signed)
SPORTS MEDICINE & JOINT REPLACEMENT   Georgena Spurling, MD   Altamese Cabal, PA-C [address] [place], [place], [place]  [address]                             [phone]  PATIENT ID: [name]        MRN:  [id number]          DOB/AGE: Nov 19, 1940 / 73 y.o.    DISCHARGE SUMMARY  ADMISSION DATE:    11/04/2013 DISCHARGE DATE:   11/06/2013   ADMISSION DIAGNOSIS: osteoarthritis right knee    DISCHARGE DIAGNOSIS:  osteoarthritis right knee    ADDITIONAL DIAGNOSIS: Active Problems:   S/P total knee arthroplasty  Past Medical History  Diagnosis Date  . PONV (postoperative nausea and vomiting)   . Arthritis   . Shortness of breath     WITH EXERTION   . Hypertension     takes Metoprolol,Amlodipine and Losartan daily  . GERD (gastroesophageal reflux disease)     takes Omeprazole daily  . Redness     to both lower extremities  . Peripheral edema     takes HCTZ daily and Furosemide daily as needed  . Sleep apnea     doesn't use cpap  . History of bronchitis 2014  . Headache(784.0)     occasionally  . Numbness     hands occasionally  . Joint pain   . Joint swelling   . Back pain     reason unknown  . Diarrhea   . History of colon polyps   . Urinary frequency   . Urinary urgency   . Nocturia   . History of blood transfusion     no abnormal reaction noted  . Cataract     bilateral immature  . History of shingles     PROCEDURE: Procedure(s): RIGHT TOTAL KNEE ARTHROPLASTY on 11/04/2013  CONSULTS:     HISTORY:  See H&P in chart  HOSPITAL COURSE:  [name] is a 73 y.o. admitted on 11/04/2013 and found to have a diagnosis of osteoarthritis right knee.  After appropriate laboratory studies were obtained  they were taken to the operating room on 11/04/2013 and underwent Procedure(s): RIGHT TOTAL KNEE ARTHROPLASTY.   They were given perioperative antibiotics:  Anti-infectives   Start     Dose/Rate Route Frequency Ordered Stop   11/04/13 1630  ceFAZolin (ANCEF) IVPB 2 g/50  mL premix     2 g 100 mL/hr over 30 Minutes Intravenous Every 6 hours 11/04/13 1503 11/04/13 2254   11/04/13 0600  ceFAZolin (ANCEF) 3 g in dextrose 5 % 50 mL IVPB  Status:  Discontinued     3 g 160 mL/hr over 30 Minutes Intravenous On call to O.R. 11/03/13 1427 11/04/13 1500    .  Tolerated the procedure well.  Placed with a foley intraoperatively.  Given Ofirmev at induction and for 48 hours.    POD# 1: Vital signs were stable.  Patient denied Chest pain, shortness of breath, or calf pain.  Patient was started on Lovenox 30 mg subcutaneously twice daily at 8am.  Consults to PT, OT, and care management were made.  The patient was weight bearing as tolerated.  CPM was placed on the operative leg 0-90 degrees for 6-8 hours a day.  Incentive spirometry was taught.  Dressing was changed.  Marcaine pump and hemovac were discontinued.      POD #2, Continued  PT for ambulation  and exercise program.  IV saline locked.  O2 discontinued.    The remainder of the hospital course was dedicated to ambulation and strengthening.   The patient was discharged on 2 Days Post-Op in  Good condition.  Blood products given:none  DIAGNOSTIC STUDIES: Recent vital signs: Patient Vitals for the past 24 hrs:  BP Temp Pulse Resp SpO2  11/06/13 0924 165/49 mmHg - - - -  11/06/13 0435 127/44 mmHg 98.1 F (36.7 C) 82 18 95 %  11/05/13 2116 136/45 mmHg 98.6 F (37 C) 81 18 94 %  11/05/13 2000 - - - 16 94 %  11/05/13 1424 113/43 mmHg 98.1 F (36.7 C) 77 16 92 %       Recent laboratory studies:  Recent Labs  11/04/13 1530 11/05/13 0502 11/06/13 0347  WBC 13.8* 11.5* 11.0*  HGB 11.5* 10.1* 9.0*  HCT 35.6* 31.8* 28.1*  PLT 221 211 187    Recent Labs  11/04/13 1530 11/05/13 0502  NA  --  141  K  --  4.1  CL  --  102  CO2  --  25  BUN  --  13  CREATININE 0.82 0.81  GLUCOSE  --  134*  CALCIUM  --  8.2*   Lab Results  Component Value Date   INR 0.98 10/22/2013   INR 0.92 02/07/2012      Recent Radiographic Studies :  Dg Chest 2 View  10/22/2013   CLINICAL DATA:  Hypertension .  EXAM: CHEST  2 VIEW  COMPARISON:  02/07/2012 .  FINDINGS: Stable changes of pleural parenchymal scarring noted in the lung bases. No acute cardiopulmonary disease. No pleural effusion or pneumothorax. No acute osseous abnormality. Degenerative changes thoracic spine.  IMPRESSION: No acute cardiopulmonary disease. Pleural parenchymal scarring both lung bases .   Electronically Signed   By: Maisie Fus  Register   On: 10/22/2013 17:01    DISCHARGE INSTRUCTIONS: Discharge Orders   Future Orders Complete By Expires   Call MD / Call 911  As directed    Comments:     If you experience chest pain or shortness of breath, CALL 911 and be transported to the hospital emergency room.  If you develope a fever above 101 F, pus (white drainage) or increased drainage or redness at the wound, or calf pain, call your surgeon's office.   Change dressing  As directed    Comments:     Change dressing on thursday, then change the dressing daily with sterile 4 x 4 inch gauze dressing and apply TED hose.   Constipation Prevention  As directed    Comments:     Drink plenty of fluids.  Prune juice may be helpful.  You may use a stool softener, such as Colace (over the counter) 100 mg twice a day.  Use MiraLax (over the counter) for constipation as needed.   CPM  As directed    Comments:     Continuous passive motion machine (CPM):      Use the CPM from 0 to 90 for 6-8 hours per day.      You may increase by 10 per day.  You may break it up into 2 or 3 sessions per day.      Use CPM for 2 weeks or until you are told to stop.   Diet - low sodium heart healthy  As directed    Do not put a pillow under the knee. Place it under the heel.  As directed  Driving restrictions  As directed    Comments:     No driving for 6 weeks   Increase activity slowly as tolerated  As directed    Lifting restrictions  As directed    Comments:      No lifting for 6 weeks   TED hose  As directed    Comments:     Use stockings (TED hose) for 3 weeks on both leg(s).  You may remove them at night for sleeping.      DISCHARGE MEDICATIONS:     Medication List    STOP taking these medications       aspirin EC 81 MG tablet     ibuprofen 200 MG tablet  Commonly known as:  ADVIL,MOTRIN     losartan 100 MG tablet  Commonly known as:  COZAAR      TAKE these medications       acetaminophen 500 MG tablet  Commonly known as:  TYLENOL  Take 500 mg by mouth every 6 (six) hours as needed. For pain     amLODipine 5 MG tablet  Commonly known as:  NORVASC  Take 5 mg by mouth daily.     CALCIUM 1200+D3 PO  Take 1 tablet by mouth daily.     enoxaparin 40 MG/0.4ML injection  Commonly known as:  LOVENOX  Inject 0.4 mLs (40 mg total) into the skin daily.     furosemide 40 MG tablet  Commonly known as:  LASIX  Take 40 mg by mouth daily as needed. Water gain     GLUCOSAMINE CHONDR COMPLEX PO  Take 2 capsules by mouth daily.     hydrochlorothiazide 25 MG tablet  Commonly known as:  HYDRODIURIL  Take 25 mg by mouth daily.     methocarbamol 500 MG tablet  Commonly known as:  ROBAXIN  Take 1-2 tablets (500-1,000 mg total) by mouth every 6 (six) hours as needed for muscle spasms.     metoprolol tartrate 25 MG tablet  Commonly known as:  LOPRESSOR  Take 25 mg by mouth 2 (two) times daily.     omeprazole 20 MG capsule  Commonly known as:  PRILOSEC  Take 20 mg by mouth daily.     oxyCODONE 5 MG immediate release tablet  Commonly known as:  Oxy IR/ROXICODONE  Take 1-2 tablets (5-10 mg total) by mouth every 3 (three) hours as needed for breakthrough pain.     OxyCODONE 10 mg T12a 12 hr tablet  Commonly known as:  OXYCONTIN  Take 1 tablet (10 mg total) by mouth every 12 (twelve) hours.     potassium gluconate 595 MG Tabs tablet  Take 595 mg by mouth daily.     triamcinolone cream 0.1 %  Commonly known as:  KENALOG  Apply  1 application topically 2 (two) times daily. Alternates with lotion     triamcinolone lotion 0.1 %  Commonly known as:  KENALOG  Apply 1 application topically 3 (three) times daily. Alternates with cream     vitamin B-12 1000 MCG tablet  Commonly known as:  CYANOCOBALAMIN  Take 1,000 mcg by mouth daily.        FOLLOW UP VISIT:       Follow-up Information   Follow up with Raymon MuttonLUCEY,STEPHEN D, MD. Call on 11/19/2013.   Specialty:  Orthopedic Surgery   Contact information:   [address] [place] [address] [phone]       Follow up On 11/06/2013.  DISPOSITION: HOME   CONDITION:  Good   [name] 11/06/2013, 1:53 PM

## 2013-11-06 NOTE — Progress Notes (Signed)
D/c instructions reviewed with pt,  Pt has instructions to stop losartan/cozaar at home. Pt has been taking this at home daily and has been taking this med here daily. No indication in MD notes as to why she is to stop med. Call placed at this time to Dr Lucey's office to have clarification for pt. Message left with above information and number to return call.

## 2013-11-07 ENCOUNTER — Encounter (HOSPITAL_COMMUNITY): Payer: Self-pay | Admitting: Orthopedic Surgery

## 2015-12-21 ENCOUNTER — Encounter: Payer: Self-pay | Admitting: Pulmonary Disease

## 2015-12-22 ENCOUNTER — Ambulatory Visit (INDEPENDENT_AMBULATORY_CARE_PROVIDER_SITE_OTHER): Payer: Medicare Other | Admitting: Pulmonary Disease

## 2015-12-22 ENCOUNTER — Encounter: Payer: Self-pay | Admitting: Pulmonary Disease

## 2015-12-22 VITALS — BP 148/62 | HR 74 | Temp 98.9°F | Ht 68.0 in | Wt 302.2 lb

## 2015-12-22 DIAGNOSIS — R9389 Abnormal findings on diagnostic imaging of other specified body structures: Secondary | ICD-10-CM | POA: Insufficient documentation

## 2015-12-22 DIAGNOSIS — R938 Abnormal findings on diagnostic imaging of other specified body structures: Secondary | ICD-10-CM | POA: Diagnosis not present

## 2015-12-22 DIAGNOSIS — R918 Other nonspecific abnormal finding of lung field: Secondary | ICD-10-CM | POA: Insufficient documentation

## 2015-12-22 NOTE — Progress Notes (Addendum)
Subjective:     Patient ID: [name], female   DOB: 08/18/1941, 75 y.o.   MRN: [id number]  HPI ~  December 22, 2015:  Initial pulmonary consult by SN>      61 y/o WF referred by Dr. Delorise Jackson from [facility] for a pulmonary evaluation>  Pt describes an episode of bronchitis in Feb2017 characterized by cough, beige sput/ no hemoptysis, and incr SOB w/ wheezing;  She was treated w/ Doxy & Pred w/ improvement;  She knows that her dyspnea is likely from her weight & physical deconditioning/ lack of exercise;  CXR obtained & showed mult pulm nodules;  Subseq CT Chest 11/17/15 at [facility] in [place] showed norm heart size, several bilateral subcentimeter nodules more numerous on the right w/ the largest measuring 5mm in the RUL, no suspicious spiculated or ground glass nodules, no adenopathy, mild thoracic spondylosis, no other lesions... She has no known hx cancer, had benign breast biopsy in 1989...  Smoking Hx>  She is a never smoker, but had second hand exposure from parents, ex-husb (who died from lung ca), & boyfriend who recently quit...  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  Old CXR in Epic/PACS system 10/22/13>  Norm heart size, mild pleuro-parenchymal scarring at lung bases bilat, NAD & no change from comparison film in 2013, DJD in Tspine...   EKG 10/22/13>  NSR, rate77, poor R progression V1-3, no acute STTWA...  CXR (I cannot yet access the disc to view the CXR file from [facility])  CT Chest 11/17/15 at [facility] in [place] showed norm heart size, several bilateral subcentimeter nodules more numerous on the right w/ the largest measuring 5mm in the RUL, no suspicious spiculated or ground glass nodules, no adenopathy, mild thoracic spondylosis, no other lesions  IMP >>     Abn CXR/ CT Chest Feb2017 in [facility] showing bilat subcentimeter pulm nodules, no known underlying cancer, she is a never smoker; plan is to repeat CT Chest after 52mo interval=> sched for 04/2016...    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    Morbid Obesity w/ OSA & prob restrictive lung dis=> we reviewed the importance of DIET/ EXERCISE/ Wt reduction strategies...    Cardiovasc issues>  HBP, Venous insuffic/ stasis changes/ edema in LEs  Medical issues> HH/GERD, colon polyps, DJD w/ bilat TKRs PLAN >>     We discussed her episode of bronchitis 2/17 (now resolved);  We reviewed her DOE likely due to her obesity/ lack of physical exercise/ deconditioning and she needs to get motivated to lose the weight & increase her exercise program;  We held-off on doing PFTs at this point while she gets on track w/ diet & exercise;  She is a never smoker w/ several bilat <59mm pulm nodules and the  plan is to follow radiologies rec for repeat CT Chest in 49mo=> due Aug2017... She will call for any breathing issues in the interim.    Past Medical History  Diagnosis Date  . PONV (postoperative nausea and vomiting)   . Arthritis   . Shortness of breath     WITH EXERTION   . Hypertension     takes Metoprolol,Amlodipine and Losartan daily  . GERD (gastroesophageal reflux disease)     takes Omeprazole daily  . Redness     to both lower extremities  . Peripheral edema     takes HCTZ daily and Furosemide daily as needed  . Sleep apnea     doesn't use cpap  . History of bronchitis 2014  . Headache(784.0)     occasionally  . Numbness     hands occasionally  . Joint pain   . Joint swelling   . Back pain     reason unknown  . Diarrhea   . History of colon polyps   . Urinary frequency   . Urinary urgency   . Nocturia   . History of blood transfusion     no abnormal reaction noted  . Cataract     bilateral immature  . History of shingles   . Lung nodule   . Osteoarthritis   . Hiatal hernia   . Anxiety   . Venous insufficiency     Past Surgical History  Procedure Laterality Date  . Ankle surgery Left 1993  . Hand surgery Left 2011  . 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 Breast surgery      LEFT SIDE BIOPSY 1989  . Quiadricp  10'21/2012  . Quadriceps tendon repair  07/16/2012    Procedure: REPAIR QUADRICEP TENDON;  Surgeon: Raymon Mutton, MD;  Location: [facility] OR;  Service: Orthopedics;  Laterality: Left;  open repair of VMO   . Tubal ligation  1974  . Abdominal hysterectomy  1983  . Vasculitic eruption of legs  1985  . Cholecystectomy  1990  . Anterior cruciate ligament repair Left 2013  . Esophagogastroduodenoscopy    . Colonoscopy    . Total knee arthroplasty Right 11/04/2013    DR LUCEY  . Total knee arthroplasty Right 11/04/2013     Procedure: RIGHT TOTAL KNEE ARTHROPLASTY;  Surgeon: Dannielle Huh, MD;  Location: [facility] OR;  Service: Orthopedics;  Laterality: Right;    Outpatient Encounter Prescriptions as of 12/22/2015  Medication Sig  . acetaminophen (TYLENOL) 500 MG tablet Take 500 mg by mouth every 6 (six) hours as needed. For pain  . aspirin 81 MG tablet Take 81 mg by mouth daily.  . Biotin 5 MG CAPS Take 1 capsule by mouth daily.  . Calcium-Magnesium-Vitamin D (CALCIUM 1200+D3 PO) Take 1 tablet by mouth daily.  . furosemide (LASIX) 40 MG tablet Take 40 mg by mouth  daily as needed. Water gain  . Glucosamine-Chondroitin (GLUCOSAMINE CHONDR COMPLEX PO) Take 2 capsules by mouth daily.  . hydrochlorothiazide (HYDRODIURIL) 25 MG tablet Take 25 mg by mouth daily.  Marland Kitchen losartan (COZAAR) 100 MG tablet Take 100 mg by mouth daily.  . metoprolol tartrate (LOPRESSOR) 25 MG tablet Take 25 mg by mouth 2 (two) times daily.   Marland Kitchen omeprazole (PRILOSEC) 20 MG capsule Take 20 mg by mouth daily.  . Pantoprazole Sodium (PROTONIX PO) Take by mouth as needed.  . potassium gluconate 595 MG TABS tablet Take 550 mg by mouth daily.   Marland Kitchen triamcinolone lotion (KENALOG) 0.1 % Apply 1 application topically 3 (three) times daily. Alternates with cream  . vitamin B-12 (CYANOCOBALAMIN) 1000 MCG tablet Take 1,000 mcg by mouth daily.  . [DISCONTINUED] triamcinolone cream (KENALOG) 0.1 % Apply 1 application topically 2 (two) times daily. Alternates with lotion  . [DISCONTINUED] amLODipine (NORVASC) 5 MG tablet Take 5 mg by mouth daily.  . [DISCONTINUED] enoxaparin (LOVENOX) 40 MG/0.4ML injection Inject 0.4 mLs (40 mg total) into the skin daily.  . [DISCONTINUED] methocarbamol (ROBAXIN) 500 MG tablet Take 1-2 tablets (500-1,000 mg total) by mouth every 6 (six) hours as needed for muscle spasms.  . [DISCONTINUED] oxyCODONE (OXY IR/ROXICODONE) 5 MG immediate release tablet Take 1-2 tablets (5-10 mg total) by mouth every 3 (three) hours as needed for breakthrough pain.   . [DISCONTINUED] OxyCODONE (OXYCONTIN) 10 mg T12A 12 hr tablet Take 1 tablet (10 mg total) by mouth every 12 (twelve) hours.   No facility-administered encounter medications on file as of 12/22/2015.    Allergies  Allergen Reactions  . Adhesive [Tape]     Skin is very sensitive    Immunization History  Administered Date(s) Administered  . Influenza,inj,Quad PF,36+ Mos 06/27/2015  . Pneumococcal-Unspecified 09/26/2005  She is up-to-date on indicated vaccinations per PCP note- 11/30/15   Family History  Problem Relation Age of Onset  . Cancer Father   . Hypertension Mother   . Emphysema Mother   . Heart disease Sister    . Social History   Social History  . Marital Status: Single    Spouse Name: N/A  . Number of Children: N/A  . Years of Education: N/A   Occupational History  . retired    Social History Main Topics  . Smoking status: Never Smoker   . Smokeless tobacco: Never Used  . Alcohol Use: No  . Drug Use: No  . Sexual Activity: No   Other Topics Concern  . Not on file   Social History Narrative    Current Medications, Allergies, Past Medical History, Past Surgical History, Family History, and Social History were reviewed in [employer] record.   Review of Systems             All symptoms NEG except where BOLDED >>  Constitutional:  F/C/S, fatigue, anorexia, unexpected weight change. HEENT:  HA, visual changes, hearing loss, earache, nasal symptoms, sore throat, mouth sores, hoarseness. Resp:  cough, sputum, hemoptysis; SOB, tightness, wheezing. Cardio:  CP, palpit, DOE, orthopnea, edema. GI:  N/V/D/C, blood in stool; reflux, abd pain, distention, gas. GU:  dysuria, freq, urgency, hematuria, flank pain, voiding difficulty. MS:  joint pain, swelling, tenderness, decr ROM; neck pain, back pain, etc. Neuro:  HA, tremors, seizures, dizziness, syncope, weakness, numbness, gait abn. Skin:  suspicious lesions or skin rash. Heme:   adenopathy, bruising, bleeding. Psyche:  confusion, agitation, sleep disturbance, hallucinations, anxiety, depression suicidal.   Objective:  Physical Exam       Vital Signs:  Reviewed...  General:  WD, obese, 75 y/o WF in NAD; alert & oriented; pleasant & cooperative... HEENT:  Arvin/AT; Conjunctiva- pink, Sclera- nonicteric, EOM-wnl, PERRLA, EACs-clear, TMs-wnl; NOSE-clear; THROAT-clear & wnl. Neck:  Supple w/ fair ROM; no JVD; normal carotid impulses w/o bruits; no thyromegaly or nodules palpated; no lymphadenopathy. Chest:  Clear to P & A; without wheezes, rales, or rhonchi heard. Heart:  Regular Rhythm; norm S1 & S2 without murmurs, rubs, or gallops detected. Abdomen:  Obese, soft & nontender- no guarding or rebound; +panniculus, normal bowel sounds; no organomegaly or masses palpated. Ext:  decr ROM; s/p bilat TKRs, +arthritic changes; no varicose veins, +venous insuffic/ stasis changes/ edema;  Pulses intact w/o bruits. Neuro:  No focal neuro deficits; gait OK & balance OK. Derm:  No lesions noted; no rash etc. Lymph:  No cervical, supraclavicular, axillary, or inguinal adenopathy palpated.   Assessment:      IMP >>     Abn CXR/ CT Chest Feb2017 in [facility] showing bilat subcentimeter pulm nodules, no known underlying cancer, she is a never smoker; plan is to repeat CT Chest after 37mo interval=> sched for 04/2016...    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    Morbid Obesity w/ OSA & prob restrictive lung dis=> we reviewed the importance of DIET/ EXERCISE/ Wt reduction strategies...    Cardiovasc issues>  HBP, Venous insuffic/ stasis changes/ edema in LEs    Medical issues>  HH/GERD, colon polyps, DJD w/ bilat TKRs PLAN >>     We discussed her episode of bronchitis 2/17 (now resolved);  We reviewed her DOE likely due to her obesity/ lack of physical exercise/ deconditioning and she needs to get motivated to lose the weight & increase her exercise program;  We held-off on doing PFTs at this point while she gets on track w/ diet & exercise;  She is a never smoker w/ several bilat <38mm pulm nodules and the plan is to follow radiologies rec for repeat CT Chest in 337mo=> due Aug2017... She will call for any breathing issues in the interim.     Plan:     Patient's Medications  New Prescriptions   No medications on file  Previous Medications   ACETAMINOPHEN (TYLENOL) 500 MG TABLET    Take 500 mg by mouth every 6 (six) hours as needed. For pain   ASPIRIN 81 MG TABLET    Take 81 mg by mouth daily.   BIOTIN 5 MG CAPS    Take 1 capsule by mouth daily.   CALCIUM-MAGNESIUM-VITAMIN D (CALCIUM 1200+D3 PO)    Take 1 tablet by mouth daily.   FUROSEMIDE (LASIX) 40 MG TABLET    Take 40 mg by mouth daily as needed. Water gain   GLUCOSAMINE-CHONDROITIN (GLUCOSAMINE CHONDR COMPLEX PO)    Take 2 capsules by mouth daily.   HYDROCHLOROTHIAZIDE (HYDRODIURIL) 25 MG TABLET    Take 25 mg by mouth daily.   LOSARTAN (COZAAR) 100 MG TABLET    Take 100 mg by mouth daily.   METOPROLOL TARTRATE (LOPRESSOR) 25 MG TABLET    Take 25 mg by mouth 2 (two)  times daily.    OMEPRAZOLE (PRILOSEC) 20 MG CAPSULE    Take 20 mg by mouth daily.   PANTOPRAZOLE SODIUM (PROTONIX PO)    Take by mouth as needed.   POTASSIUM GLUCONATE 595 MG TABS TABLET    Take 550 mg by mouth daily.    TRIAMCINOLONE LOTION (KENALOG) 0.1 %    Apply 1 application topically 3 (three) times daily. Alternates with cream   VITAMIN B-12 (CYANOCOBALAMIN) 1000 MCG TABLET    Take 1,000 mcg by mouth daily.  Modified Medications   No medications on file  Discontinued Medications   AMLODIPINE (NORVASC) 5 MG TABLET    Take 5 mg by mouth daily.    ENOXAPARIN (LOVENOX) 40 MG/0.4ML INJECTION    Inject 0.4 mLs (40 mg total) into the skin daily.   METHOCARBAMOL (ROBAXIN) 500 MG TABLET    Take 1-2 tablets (500-1,000 mg total) by mouth every 6 (six) hours as needed for muscle spasms.   OXYCODONE (OXY IR/ROXICODONE) 5 MG IMMEDIATE RELEASE TABLET    Take 1-2 tablets (5-10 mg total) by mouth every 3 (three) hours as needed for breakthrough pain.   OXYCODONE (OXYCONTIN) 10 MG T12A 12 HR TABLET    Take 1 tablet (10 mg total) by mouth every 12 (twelve) hours.   TRIAMCINOLONE CREAM (KENALOG) 0.1 %    Apply 1 application topically 2 (two) times daily. Alternates with lotion

## 2015-12-22 NOTE — Patient Instructions (Signed)
[name]-- it was nice meeting you today...  We reviewed your medical history & the reports of the mult tiny lung nodules...  As we discussed our plan is to recheck you, get a follow up CXR & repeat the CT Chest in about 5 months- Aug2017...  We will arrange for a follow up visit followed by a CT Chest that same trip...  Call for any questions or if we can be of service in any way.Marland Kitchen..Marland Kitchen

## 2016-05-10 ENCOUNTER — Ambulatory Visit (INDEPENDENT_AMBULATORY_CARE_PROVIDER_SITE_OTHER): Payer: Medicare Other | Admitting: Pulmonary Disease

## 2016-05-10 ENCOUNTER — Encounter: Payer: Self-pay | Admitting: Pulmonary Disease

## 2016-05-10 ENCOUNTER — Ambulatory Visit: Admission: RE | Admit: 2016-05-10 | Payer: Medicare Other | Source: Ambulatory Visit

## 2016-05-10 VITALS — BP 138/88 | HR 60 | Temp 98.2°F | Ht 68.0 in | Wt 301.1 lb

## 2016-05-10 DIAGNOSIS — R918 Other nonspecific abnormal finding of lung field: Secondary | ICD-10-CM

## 2016-05-10 DIAGNOSIS — K439 Ventral hernia without obstruction or gangrene: Secondary | ICD-10-CM

## 2016-05-10 DIAGNOSIS — R0600 Dyspnea, unspecified: Secondary | ICD-10-CM | POA: Diagnosis not present

## 2016-05-10 DIAGNOSIS — E669 Obesity, unspecified: Secondary | ICD-10-CM

## 2016-05-10 DIAGNOSIS — I8312 Varicose veins of left lower extremity with inflammation: Secondary | ICD-10-CM

## 2016-05-10 DIAGNOSIS — R9389 Abnormal findings on diagnostic imaging of other specified body structures: Secondary | ICD-10-CM

## 2016-05-10 DIAGNOSIS — R938 Abnormal findings on diagnostic imaging of other specified body structures: Secondary | ICD-10-CM | POA: Diagnosis not present

## 2016-05-10 DIAGNOSIS — R0602 Shortness of breath: Secondary | ICD-10-CM | POA: Insufficient documentation

## 2016-05-10 DIAGNOSIS — I8311 Varicose veins of right lower extremity with inflammation: Secondary | ICD-10-CM

## 2016-05-10 DIAGNOSIS — I872 Venous insufficiency (chronic) (peripheral): Secondary | ICD-10-CM

## 2016-05-10 NOTE — Patient Instructions (Signed)
Today we updated your med list in our EPIC system...    Continue your current medications the same...  We decided to wait 6 more months until Feb2018 to repeat your CT Chest scan to follow up the tiny nodules prev identified...  We will set this up for you.  We will also make a referral to drPyrtle in GI to check the abd wall hernia AND evaluate you for the colonoscopy that is due now...  Let's get on track w/ your diet & exercise program to aide in weight reduction...  Call for any questions...  .Marland Kitchen

## 2016-05-10 NOTE — Progress Notes (Signed)
Subjective:     Patient ID: [name], female   DOB: 1940/10/22, 75 y.o.   MRN: [id number]  HPI  ~  December 22, 2015:  Initial pulmonary consult by SN>      5 y/o WF referred by Dr. Delorise Jackson from [facility] for a pulmonary evaluation>  Pt describes an episode of bronchitis in Feb2017 characterized by cough, beige sput/ no hemoptysis, and incr SOB w/ wheezing;  She was treated w/ Doxy & Pred w/ improvement;  She knows that her dyspnea is likely from her weight & physical deconditioning/ lack of exercise;  CXR obtained & showed mult pulm nodules;  Subseq CT Chest 11/17/15 at [facility] in [place] showed norm heart size, several bilateral subcentimeter nodules more numerous on the right w/ the largest measuring 5mm in the RUL, no suspicious spiculated or ground glass nodules, no adenopathy, mild thoracic spondylosis, no other lesions... She has no known hx cancer, had benign breast biopsy in 1989...  Smoking Hx>  She is a never smoker, but had second hand exposure from parents, ex-husb (who died from lung ca), & boyfriend who recently quit...  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  Old CXR in Epic/PACS system 10/22/13>  Norm heart size, mild pleuro-parenchymal scarring at lung bases bilat, NAD & no change from comparison film in 2013, DJD in [facility]...   EKG 10/22/13>  NSR, rate77, poor R progression V1-3, no acute STTWA...  CXR (I cannot yet access the disc to view the CXR file from [place])  CT Chest 11/17/15 at [facility] in [place] showed norm heart size, several bilateral subcentimeter nodules more numerous on the right w/ the largest measuring 5mm in the RUL, no suspicious spiculated or ground glass nodules, no adenopathy, mild thoracic spondylosis, no other lesions IMP >>     Abn CXR/ CT Chest Feb2017 in [facility] showing bilat subcentimeter pulm nodules, no known underlying cancer, she is a never smoker; plan is to repeat CT Chest after 48mo interval=> sched for 04/2016...    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    Morbid Obesity w/ OSA & prob restrictive lung dis=> we reviewed the importance of DIET/ EXERCISE/ Wt reduction strategies...    Cardiovasc issues>  HBP, Venous insuffic/ stasis changes/ edema in LEs    Medical  issues> HH/GERD, colon polyps, DJD w/ bilat TKRs PLAN >>     We discussed her episode of bronchitis 2/17 (now resolved);  We reviewed her DOE likely due to her obesity/ lack of physical exercise/ deconditioning and she needs to get motivated to lose the weight & increase her exercise program;  We held-off on doing PFTs at this point while she gets on track w/ diet & exercise;  She is a never smoker w/ several bilat <76mm pulm nodules and the  plan is to follow radiologies rec for repeat CT Chest in 23mo=> due Aug2017... She will call for any breathing issues in the interim.   ~  May 10, 2016:  21mo ROV w/ SN>  [name] informs me that DrPrince passed away in [place] 71mo ago; she is followed by [facility] in [place]; she is here for a follow up visit regarding the following problems>    Abn CXR/ CT Chest Feb2017 in [facility] showing bilat subcentimeter pulm nodules, largest=72mm in RUL, no known underlying cancer, she is a never smoker; plan is to repeat CT Chest after 84mo interval=>     Dyspnea is surely multifactorial w/ her weight (300+) & lack of exercise/ physical deconditioning playing a major roll    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    Morbid Obesity w/ OSA & prob restrictive lung dis=> we reviewed the importance of DIET/ EXERCISE/ Wt reduction strategies...    Cardiovasc issues>  HBP, Venous insuffic/ stasis changes/ edema in LEs    Medical issues> HH/GERD, colon polyps, DJD w/ bilat TKRs, calcif left breast lesion Exam shows Afeb, VSS, O2sat=99% on RA at rest; Wt=301, 5'8"Tall, BMI=46;  Heent- neg, mallampati2;  Chest- clear w/o w/r/r;  Heart- RR w/o m/r/g;  Abd- obese, panniculus, nontender;  Ext- VI & stasis changes;  Neuro- intact.   LABS from [facility] 03/2016>  Chol 177 & FLP parameters at at goals;  Chems- wnl w/ BS=100, Cr=0.95;  CBC- sl anemia w/ Hg=11.8;  TSH=1.71 IMP/PLAN>>  [name] is stable- same amt of dyspnea but hasn't lost any wt & not exercising, we reviewed the importance of diet & exercise for wt reduction;  We decided to wait til her next OV for f/u CT Chest;  We will refer her to GI- DrPyrtle for abd wall (incisional hernia) & colonoscopy due. Note: >50% of this 25 min visit was spent  in counseling & coordination of care...    Past Medical History:  Diagnosis Date  . Anxiety   . Arthritis   . Back pain    reason unknown  . Cataract    bilateral immature  . Diarrhea   . GERD (gastroesophageal reflux disease)    takes Omeprazole daily  . Headache(784.0)    occasionally  . Hiatal hernia   . History of blood transfusion    no abnormal reaction noted  . History of bronchitis 2014  . History of colon polyps   . History of shingles   . Hypertension    takes Metoprolol,Amlodipine and Losartan daily  . Joint pain   . Joint swelling   . Lung nodule   . Nocturia   . Numbness    hands occasionally  . Osteoarthritis   . Peripheral edema    takes  HCTZ daily and Furosemide daily as needed  . PONV (postoperative nausea and vomiting)   . Redness    to both lower extremities  . Shortness of breath    WITH EXERTION   . Sleep apnea    doesn't use cpap  . Urinary frequency   . Urinary urgency   . Venous insufficiency     Past Surgical History:  Procedure Laterality Date  . ABDOMINAL HYSTERECTOMY  1983  . ANKLE SURGERY Left 1993  . ANTERIOR CRUCIATE LIGAMENT REPAIR Left 2013  . BREAST SURGERY     LEFT SIDE BIOPSY 1989  . CHOLECYSTECTOMY  1990  . COLONOSCOPY    . ESOPHAGOGASTRODUODENOSCOPY    . HAND SURGERY Left 2011  . QUADRICEPS TENDON REPAIR  07/16/2012   Procedure: REPAIR QUADRICEP TENDON;  Surgeon: Raymon MuttonStephen D Lucey, MD;  Location: [facility] OR;  Service: Orthopedics;  Laterality: Left;  open repair of VMO   . quiadricp  10'21/2012  . 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   Procedure: TOTAL KNEE ARTHROPLASTY;  Surgeon: Raymon MuttonStephen D Lucey, MD;  Location: [facility] OR;  Service: Orthopedics;  Laterality: Left;  . TOTAL KNEE ARTHROPLASTY Right 11/04/2013   DR Sherlean FootLUCEY  . TOTAL KNEE ARTHROPLASTY Right 11/04/2013   Procedure: RIGHT TOTAL KNEE ARTHROPLASTY;  Surgeon: Dannielle HuhSteve Lucey, MD;  Location: [facility] OR;  Service: Orthopedics;  Laterality: Right;  . TUBAL LIGATION  1974  . vasculitic  eruption of legs  1985    Outpatient Encounter Prescriptions as of 05/10/2016  Medication Sig  . acetaminophen (TYLENOL) 500 MG tablet Take 500 mg by mouth every 6 (six) hours as needed. For pain  . aspirin 81 MG tablet Take 81 mg by mouth daily.  . Biotin 5 MG CAPS Take 1 capsule by mouth daily.  . Calcium-Magnesium-Vitamin D (CALCIUM 1200+D3 PO) Take 1 tablet by mouth daily.  . furosemide (LASIX) 40 MG tablet Take 40 mg by mouth daily as needed. Water gain  . Glucosamine-Chondroitin (GLUCOSAMINE CHONDR COMPLEX PO) Take 2 capsules by mouth daily.  . hydrochlorothiazide (HYDRODIURIL) 25 MG tablet Take 25 mg by mouth daily.  Marland Kitchen. losartan (COZAAR) 100 MG tablet Take 100 mg by mouth daily.  Marland Kitchen. MEGARED OMEGA-3 KRILL OIL 500 MG CAPS Take 1 tablet by mouth daily.  . metoprolol tartrate (LOPRESSOR) 25 MG tablet Take 25 mg by mouth 2 (two) times daily.   Marland Kitchen. omeprazole (PRILOSEC) 20 MG capsule Take 20 mg by mouth daily.  . Pantoprazole Sodium (PROTONIX PO) Take by mouth as needed.  . potassium gluconate 595 MG TABS tablet Take 550 mg by mouth daily.   Marland Kitchen. triamcinolone lotion (KENALOG) 0.1 % Apply 1 application topically 3 (three) times daily. Alternates with cream  . vitamin B-12 (CYANOCOBALAMIN) 1000 MCG tablet Take 1,000 mcg by mouth daily.   No facility-administered encounter medications on file as of 05/10/2016.     Allergies  Allergen Reactions  . Adhesive [Tape]     Skin is very sensitive    Immunization History  Administered Date(s) Administered  . Influenza,inj,Quad PF,36+ Mos 06/27/2015  . Pneumococcal-Unspecified 09/26/2005  She is up-to-date on indicated vaccinations per PCP note- 11/30/15   Current Medications, Allergies, Past Medical History, Past Surgical History, Family History, and Social History were reviewed in [facility] electronic medical record.   Review of Systems             All symptoms NEG except where BOLDED >>  Constitutional:  F/C/S, fatigue, anorexia,  unexpected weight change. HEENT:  HA, visual  changes, hearing loss, earache, nasal symptoms, sore throat, mouth sores, hoarseness. Resp:  cough, sputum, hemoptysis; SOB, tightness, wheezing. Cardio:  CP, palpit, DOE, orthopnea, edema. GI:  N/V/D/C, blood in stool; reflux, abd pain, distention, gas. GU:  dysuria, freq, urgency, hematuria, flank pain, voiding difficulty. MS:  joint pain, swelling, tenderness, decr ROM; neck pain, back pain, etc. Neuro:  HA, tremors, seizures, dizziness, syncope, weakness, numbness, gait abn. Skin:  suspicious lesions or skin rash. Heme:  adenopathy, bruising, bleeding. Psyche:  confusion, agitation, sleep disturbance, hallucinations, anxiety, depression suicidal.   Objective:   Physical Exam       Vital Signs:  Reviewed...   General:  WD, obese, 75 y/o WF in NAD; alert & oriented; pleasant & cooperative... HEENT:  [place]/AT; Conjunctiva- pink, Sclera- nonicteric, EOM-wnl, PERRLA, EACs-clear, TMs-wnl; NOSE-clear; THROAT-clear & wnl.  Neck:  Supple w/ fair ROM; no JVD; normal carotid impulses w/o bruits; no thyromegaly or nodules palpated; no lymphadenopathy.  Chest:  Clear to P & A; without wheezes, rales, or rhonchi heard. Heart:  Regular Rhythm; norm S1 & S2 without murmurs, rubs, or gallops detected. Abdomen:  Obese, soft & nontender- no guarding or rebound; +panniculus, normal bowel sounds; no organomegaly or masses palpated. Ext:  decr ROM; s/p bilat TKRs, +arthritic changes; no varicose veins, +venous insuffic/ stasis changes/ edema;  Pulses intact w/o bruits. Neuro:  No focal neuro deficits; gait OK & balance OK. Derm:  No lesions noted; no rash etc. Lymph:  No cervical, supraclavicular, axillary, or inguinal adenopathy palpated.   Assessment:      IMP >>     Abn CXR/ CT Chest Feb2017 in [facility] showing bilat subcentimeter pulm nodules, no known underlying cancer, she is a never smoker; plan is to repeat CT Chest after 15mo interval=> sched  for 04/2016...    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    Morbid Obesity w/ OSA & prob restrictive lung dis=> we reviewed the importance of DIET/ EXERCISE/ Wt reduction strategies...    Cardiovasc issues>  HBP, Venous insuffic/ stasis changes/ edema in LEs    Medical issues> HH/GERD, colon polyps, DJD w/ bilat TKRs PLAN >>  12/22/15>   We discussed her episode of bronchitis 2/17 (now resolved);  We reviewed her DOE likely due to her obesity/ lack of physical exercise/ deconditioning and she needs to get motivated to lose the weight & increase her exercise program;  We held-off on doing PFTs at this point while she gets on track w/ diet & exercise;  She is a never smoker w/ several bilat <28mm pulm nodules and the plan is to follow radiologies rec for repeat CT Chest in 515mo=> due Aug2017... She will call for any breathing issues in the interim. 05/10/16>   [name] is stable- same amt of dyspnea but hasn't lost any wt & not exercising, we reviewed the importance of diet & exercise for wt reduction;  We decided to wait til her next OV for f/u CT Chest;  We will refer her to GI- DrPyrtle for abd wall (incisional hernia) & colonoscopy due     Plan:     Patient's Medications  New Prescriptions   No medications on  file  Previous Medications   ACETAMINOPHEN (TYLENOL) 500 MG TABLET    Take 500 mg by mouth every 6 (six) hours as needed. For pain   ASPIRIN 81 MG TABLET    Take 81 mg by mouth daily.   BIOTIN 5 MG CAPS    Take 1 capsule by mouth daily.   CALCIUM-MAGNESIUM-VITAMIN D (CALCIUM 1200+D3 PO)    Take 1 tablet by mouth daily.   FUROSEMIDE (LASIX) 40 MG TABLET    Take 40 mg by mouth daily as needed.  Water gain   GLUCOSAMINE-CHONDROITIN (GLUCOSAMINE CHONDR COMPLEX PO)    Take 2 capsules by mouth daily.   HYDROCHLOROTHIAZIDE (HYDRODIURIL) 25 MG TABLET    Take 25 mg by mouth daily.   LOSARTAN (COZAAR) 100 MG TABLET    Take 100 mg by mouth daily.   MEGARED OMEGA-3 KRILL OIL 500 MG CAPS    Take 1 tablet by mouth daily.   METOPROLOL TARTRATE (LOPRESSOR) 25 MG TABLET    Take 25 mg by mouth 2 (two) times daily.    OMEPRAZOLE (PRILOSEC) 20 MG CAPSULE    Take 20 mg by mouth daily.   PANTOPRAZOLE SODIUM (PROTONIX PO)    Take by mouth as needed.   POTASSIUM GLUCONATE 595 MG TABS TABLET    Take 550 mg by mouth daily.    TRIAMCINOLONE LOTION (KENALOG) 0.1 %    Apply 1 application topically 3 (three) times daily. Alternates with cream   VITAMIN B-12 (CYANOCOBALAMIN) 1000 MCG TABLET    Take 1,000 mcg by mouth daily.  Modified Medications   No medications on file  Discontinued Medications   No medications on file

## 2016-05-31 ENCOUNTER — Telehealth: Payer: Self-pay | Admitting: Internal Medicine

## 2016-06-02 NOTE — Telephone Encounter (Signed)
Dr. Pyrtle reviewed records and has accepted patient. Ok to schedule OV. Left message for patient to return my call.  °

## 2016-06-03 ENCOUNTER — Encounter: Payer: Self-pay | Admitting: Internal Medicine

## 2016-06-22 ENCOUNTER — Telehealth: Payer: Self-pay | Admitting: Pulmonary Disease

## 2016-07-28 NOTE — Telephone Encounter (Signed)
Void  

## 2016-08-05 ENCOUNTER — Encounter: Payer: Self-pay | Admitting: *Deleted

## 2016-08-30 ENCOUNTER — Ambulatory Visit (INDEPENDENT_AMBULATORY_CARE_PROVIDER_SITE_OTHER): Payer: Medicare Other | Admitting: Internal Medicine

## 2016-08-30 ENCOUNTER — Encounter: Payer: Self-pay | Admitting: Internal Medicine

## 2016-08-30 VITALS — BP 140/62 | HR 72 | Ht 66.0 in | Wt 296.2 lb

## 2016-08-30 DIAGNOSIS — R150 Incomplete defecation: Secondary | ICD-10-CM

## 2016-08-30 DIAGNOSIS — K219 Gastro-esophageal reflux disease without esophagitis: Secondary | ICD-10-CM

## 2016-08-30 DIAGNOSIS — K432 Incisional hernia without obstruction or gangrene: Secondary | ICD-10-CM | POA: Diagnosis not present

## 2016-08-30 NOTE — Patient Instructions (Addendum)
Please purchase the following medications over the counter and take as directed: Benefiber 1 tablespoon daily then increase to 2 tablespoons daily  You have been scheduled for an appointment with Dr Avel Peaceodd Rosenbower at [facility]. Your appointment is on _____________ at ___________. Please arrive at ______________ for registration. Make certain to bring a list of current medications, including any over the counter medications or vitamins. Also bring your co-pay if you have one as well as your insurance cards. [facility] is located at [address], [address]. Should you need to reschedule your appointment, please contact them at [phone].  You have been scheduled for an appointment with Jeanine LuzGregory Calone, NP @ [facility] (1st floor) [facility]. You appointment is on 09/29/16 at 1:00 pm. Please arrive at 12:45 pm. Make sure to bring a list of your medications/supplements, as well as your insurance card and any copay you may have.  Please follow up with Dr Rhea BeltonPyrtle in 3-4 months.  If you are age 75 or older, your body mass index should be between 23-30. Your Body mass index is 47.82 kg/m. If this is out of the aforementioned range listed, please consider follow up with your Primary Care Provider.  If you are age 75 or younger, your body mass index should be between 19-25. Your Body mass index is 47.82 kg/m. If this is out of the aformentioned range listed, please consider follow up with your Primary Care Provider.

## 2016-08-30 NOTE — Progress Notes (Signed)
Patient ID: [name], female   DOB: April 06, 1941, 76 y.o.   MRN: [id number] HPI: [name] is a 75 yo female with PMH of GERD, remote colon polyps, diverticulosis, hypertension, venous insufficiency with peripheral edema and obesity who was seen to violate change in bowel habits and a complete evacuation. She is here today with her friend. She reports that over the last 6-8 months she's developed a complaint evacuation symptoms. She has 2-3 bowel movements throughout the morning and with initial bowel movement she often feels incomplete has to go back several times to complete her bowel movement. She does not feel constipated and does not have diarrhea though her stools can be loose. Usually her stools are brown and formed. No blood or melena. She reports having a symptomatic hernia at her mid abdomen which has increased in size over the last several years. Worse with bending over and straining. Had prior gallbladder surgery. Has history of GERD but this is well controlled on omeprazole 20 mg daily. She was previously on higher dose but has reduced without trouble. Would like to establish primary care and [facility]  Colonoscopy performed in April 2011 -- polypoid mucosa near the appendiceal orifice biopsy. Remainder of the colon normal. Small diverticulosis in the sigmoid. Small internal hemorrhoids. Pathology benign without adenoma  Past Medical History:  Diagnosis Date  . Anxiety   . Arthritis   . Back pain    reason unknown  . Cataract    bilateral immature  . Diarrhea   . Diverticulosis   . GERD (gastroesophageal reflux disease)    takes Omeprazole daily  . Headache(784.0)    occasionally  . Hiatal hernia   . History of blood transfusion    no abnormal reaction noted  . History of bronchitis 2014  . History of colon polyps   . History of shingles   . Hypertension    takes Metoprolol,Amlodipine and Losartan daily  . Internal hemorrhoids   . Joint pain   . Joint swelling   . Lung  nodule   . Nocturia   . Numbness    hands occasionally  . Osteoarthritis   . Peripheral edema    takes HCTZ daily and Furosemide daily as needed  . PONV (postoperative nausea and vomiting)   . Redness    to both lower extremities  . Shortness of breath    WITH EXERTION   . Sleep apnea    doesn't use cpap  . Urinary frequency   . Urinary urgency   . Venous insufficiency     Past Surgical History:  Procedure Laterality Date  . ABDOMINAL HYSTERECTOMY  1983  . ANKLE SURGERY Left 1993  . ANTERIOR CRUCIATE LIGAMENT REPAIR Left 2013  . BREAST SURGERY     LEFT SIDE BIOPSY 1989  . CHOLECYSTECTOMY  1990  . COLONOSCOPY    . ESOPHAGOGASTRODUODENOSCOPY    . HAND SURGERY Left 2011  . QUADRICEPS TENDON REPAIR  07/16/2012   Procedure: REPAIR QUADRICEP TENDON;  Surgeon: Raymon Mutton, MD;  Location: [facility] OR;  Service: Orthopedics;  Laterality: Left;  open repair of VMO   . quiadricp  10'21/2012  . 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 TOTAL KNEE ARTHROPLASTY Right 11/04/2013   Procedure: RIGHT TOTAL KNEE ARTHROPLASTY;  Surgeon: Dannielle Huh, MD;  Location: [facility]  OR;  Service: Orthopedics;  Laterality: Right;  . TUBAL LIGATION  1974  . vasculitic eruption of legs  1985    Outpatient Medications Prior to Visit  Medication Sig Dispense Refill  . acetaminophen (TYLENOL) 500 MG tablet Take 500 mg by mouth every 6 (six) hours as needed. For pain    . aspirin 81 MG tablet Take 81 mg by mouth daily.    . Calcium-Magnesium-Vitamin D (CALCIUM 1200+D3 PO) Take 1 tablet by mouth daily.    . furosemide (LASIX) 40 MG tablet Take 40 mg by mouth daily as needed. Water gain    . Glucosamine-Chondroitin (GLUCOSAMINE CHONDR COMPLEX PO) Take 2 capsules by mouth daily.    . hydrochlorothiazide (HYDRODIURIL) 25 MG tablet Take 25  mg by mouth daily.    Marland Kitchen. losartan (COZAAR) 100 MG tablet Take 100 mg by mouth daily.    Marland Kitchen. MEGARED OMEGA-3 KRILL OIL 500 MG CAPS Take 1 tablet by mouth daily.    . metoprolol tartrate (LOPRESSOR) 25 MG tablet Take 25 mg by mouth 2 (two) times daily.     Marland Kitchen. omeprazole (PRILOSEC) 20 MG capsule Take 20 mg by mouth daily.    . Pantoprazole Sodium (PROTONIX PO) Take 20 mg by mouth as needed. OTC    . potassium gluconate 595 MG TABS tablet Take 550 mg by mouth daily.     Marland Kitchen. triamcinolone lotion (KENALOG) 0.1 % Apply 1 application topically 3 (three) times daily. Alternates with cream    . vitamin B-12 (CYANOCOBALAMIN) 1000 MCG tablet Take 1,000 mcg by mouth daily.    . Biotin 5 MG CAPS Take 1 capsule by mouth daily.     No facility-administered medications prior to visit.     Allergies  Allergen Reactions  . Adhesive [Tape]     Skin is very sensitive    Family History  Problem Relation Age of Onset  . Lymphoma Father   . Leukemia Father   . Heart attack Father   . Hypertension Mother   . Emphysema Mother   . Heart disease Sister   . Rashes / Skin problems Sister   . Heart failure Sister   . Kidney disease Sister   . Heart disease Maternal Grandfather   . Heart disease Paternal Grandmother   . Heart disease Paternal Grandfather     Social History  Substance Use Topics  . Smoking status: Never Smoker  . Smokeless tobacco: Never Used  . Alcohol use No    ROS: As per history of present illness, otherwise negative  BP 140/62 (BP Location: Left Wrist, Patient Position: Sitting, Cuff Size: Normal)   Pulse 72   Ht 5\' 6"  (1.676 m) Comment: height measured without shoes  Wt 296 lb 4 oz (134.4 kg)   LMP 02/07/2012   BMI 47.82 kg/m  Constitutional: Well-developed and well-nourished. No distress. HEENT: Normocephalic and atraumatic. Oropharynx is clear and moist. No oropharyngeal exudate. Conjunctivae are normal.  No scleral icterus. Neck: Neck supple. Trachea  midline. Cardiovascular: Normal rate, regular rhythm and intact distal pulses. No M/R/G Pulmonary/chest: Effort normal and breath sounds normal. No wheezing, rales or rhonchi. Abdominal: Soft, obese, incisional hernia midline tender to palpation not easily reducible,  nondistended. Bowel sounds active throughout.  Extremities: no clubbing, cyanosis, 2-3+ pitting edema to the mid shin Lymphadenopathy: No cervical adenopathy noted. Neurological: Alert and oriented to person place and time. Skin: Skin is warm and dry. No rashes noted. Psychiatric: Normal mood and affect. Behavior is normal.  ASSESSMENT/PLAN: 75  yo female with PMH of GERD, remote colon polyps, diverticulosis, hypertension, venous insufficiency with peripheral edema and obesity who was seen to violate change in bowel habits and a complete evacuation.  1. Change in bowel habits -- suspicion for defecation dysfunction related to pelvic floor dysfunction.  Normal colonoscopy in 2011 reassuring. Will try fiber supplementation first to bulk stool and aid with evacuation. Begin Benefiber one working up to 2 tablespoons daily. If no improvement consider repeat colonoscopy. Consider anorectal manometry.  2. Ventral hernia incisional -- symptomatic, referral to Dr. Abbey Chattersosenbower  3. GERD -- stable without alarm symptoms. Continue omeprazole 20 mg daily  4. Need for primary care -- she requests Dr. Lawerance BachBurns, referral placed. She is advised to discuss lymphedema and management with primary care  Return in 3-4 months, sooner if needed

## 2016-09-01 ENCOUNTER — Telehealth: Payer: Self-pay | Admitting: *Deleted

## 2016-09-01 NOTE — Telephone Encounter (Signed)
I have spoken to patient who states that [facility] has notified her of her upcoming appointment with Dr Abbey Chattersosenbower on 09/23/16 @ 2:30 pm with 2:00 pm arrival.

## 2016-09-29 ENCOUNTER — Other Ambulatory Visit (INDEPENDENT_AMBULATORY_CARE_PROVIDER_SITE_OTHER): Payer: Medicare Other

## 2016-09-29 ENCOUNTER — Ambulatory Visit (INDEPENDENT_AMBULATORY_CARE_PROVIDER_SITE_OTHER): Payer: Medicare Other | Admitting: Family

## 2016-09-29 ENCOUNTER — Encounter: Payer: Self-pay | Admitting: Family

## 2016-09-29 VITALS — BP 154/72 | HR 63 | Temp 98.1°F | Resp 18 | Ht 66.0 in | Wt 292.0 lb

## 2016-09-29 DIAGNOSIS — K439 Ventral hernia without obstruction or gangrene: Secondary | ICD-10-CM

## 2016-09-29 DIAGNOSIS — K219 Gastro-esophageal reflux disease without esophagitis: Secondary | ICD-10-CM | POA: Insufficient documentation

## 2016-09-29 DIAGNOSIS — I872 Venous insufficiency (chronic) (peripheral): Secondary | ICD-10-CM | POA: Diagnosis not present

## 2016-09-29 DIAGNOSIS — I10 Essential (primary) hypertension: Secondary | ICD-10-CM | POA: Diagnosis not present

## 2016-09-29 DIAGNOSIS — IMO0002 Reserved for concepts with insufficient information to code with codable children: Secondary | ICD-10-CM

## 2016-09-29 DIAGNOSIS — Z792 Long term (current) use of antibiotics: Secondary | ICD-10-CM | POA: Diagnosis not present

## 2016-09-29 LAB — CBC
HCT: 37.1 % (ref 36.0–46.0)
Hemoglobin: 12.3 g/dL (ref 12.0–15.0)
MCHC: 33.2 g/dL (ref 30.0–36.0)
MCV: 86.4 fl (ref 78.0–100.0)
Platelets: 279 10*3/uL (ref 150.0–400.0)
RBC: 4.29 Mil/uL (ref 3.87–5.11)
RDW: 14.2 % (ref 11.5–15.5)
WBC: 9.5 10*3/uL (ref 4.0–10.5)

## 2016-09-29 LAB — COMPREHENSIVE METABOLIC PANEL
ALT: 11 U/L (ref 0–35)
AST: 10 U/L (ref 0–37)
Albumin: 4 g/dL (ref 3.5–5.2)
Alkaline Phosphatase: 59 U/L (ref 39–117)
BUN: 22 mg/dL (ref 6–23)
CO2: 33 mEq/L — ABNORMAL HIGH (ref 19–32)
Calcium: 9.5 mg/dL (ref 8.4–10.5)
Chloride: 102 mEq/L (ref 96–112)
Creatinine, Ser: 1.04 mg/dL (ref 0.40–1.20)
GFR: 54.76 mL/min — ABNORMAL LOW (ref >60.00)
Glucose, Bld: 113 mg/dL — ABNORMAL HIGH (ref 70–99)
Potassium: 4.1 mEq/L (ref 3.5–5.1)
Sodium: 139 mEq/L (ref 135–145)
Total Bilirubin: 1 mg/dL (ref 0.2–1.2)
Total Protein: 7.3 g/dL (ref 6.0–8.3)

## 2016-09-29 MED ORDER — AMOXICILLIN 500 MG PO CAPS: ORAL_CAPSULE | ORAL | 1 refills | Status: DC

## 2016-09-29 NOTE — Assessment & Plan Note (Signed)
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 Continue triamcinolone as prescribed and consider referral to dermatology. Referral placed for wound care for venous stasis ulcer of the right lower extremity. Obtain complete metabolic profile and CBC. Consider additional Lasix to decrease lower extremity edema. Advised to keep clean with soap and water. Elevate legs when seated, follow sodium diet. Consider compression socks. May require graded compression or sequential compression device to assist with fluid removal.

## 2016-09-29 NOTE — Assessment & Plan Note (Signed)
Multiple joint replacement surgeries with upcoming dental work. Refill amoxicillin for infection prophylaxis.

## 2016-09-29 NOTE — Assessment & Plan Note (Signed)
Gastroesophageal reflux is stable with current medication regimen and no adverse side effects. Continue current dosage of omeprazole and continue to monitor.

## 2016-09-29 NOTE — Progress Notes (Signed)
Subjective:    Patient ID: [name], female    DOB: [birth year], 76 y.o.   MRN: [id number]  Chief Complaint  Patient presents with  . Establish Care    has dermatitis on legs, has some drainage on the right leg, wanted you to know about a hernia that she has that her previous dr would not do anything with bc of her weight    HPI:  [name] is a 76 y.o. female who  has a past medical history of Anxiety; Arthritis; Back pain; Cataract; Diarrhea; Diverticulosis; GERD (gastroesophageal reflux disease); Headache(784.0); Hiatal hernia; History of blood transfusion; History of bronchitis (2014); History of colon polyps; History of shingles; Hypertension; Internal hemorrhoids; Joint pain; Joint swelling; Lung nodule; Nocturia; Numbness; Osteoarthritis; Peripheral edema; PONV (postoperative nausea and vomiting); Redness; Shortness of breath; Sleep apnea; Urinary frequency; Urinary urgency; and Venous insufficiency. and presents today for an office visit to establish care.  1.) Venous insufficiency -  Previously diagnosed with venous insufficiency and dermatitis that has been going on for about 20 years. The right lower extremity has weeping that wets her bed at night as she does not keep it wrapped at night. No fevers. No odors. Tries to keep her legs elevated that she indicates being hard. Currently taking HCTZ daily and furosemide as needed. Does have concern that the furosemide is too hard on her kidneys. Working on decreasing the sodium in her diet. Per patient she has not had any venous doppler.  2.) Ventral hernia - Previously diagnosed with a ventral hernia and followed by [facility] and was deemed a non-surgical candidate at this time secondary to weight with the goal of weight loss and will follow up with general surgery in about 3 months.  3.) GERD - Previously diagnosed with gastroesophageal reflux and currently maintained on omeprazole. Reports taken the medication as  prescribed and denies adverse side effects. Endorses symptoms are well controlled with current medication regimen.   4.) Hypertension - Currently maintained on hydrochlorothiazide and losartan. Reports taking the medications as prescribed and denies adverse side effects. Does check blood pressure at home on occasion and is similar to today. Denies headaches or new symptoms of end organ damage.   BP Readings from Last 3 Encounters:  09/29/16 (!) 154/72  08/30/16 140/62  05/10/16 138/88     Allergies  Allergen Reactions  . Adhesive [Tape]     Skin is very sensitive      Outpatient Medications Prior to Visit  Medication Sig Dispense Refill  . acetaminophen (TYLENOL) 500 MG tablet Take 500 mg by mouth every 6 (six) hours as needed. For pain    . aspirin 81 MG tablet Take 81 mg by mouth daily.    . Biotin 5000 MCG CAPS Take 1 capsule by mouth daily.    . Calcium-Magnesium-Vitamin D (CALCIUM 1200+D3 PO) Take 1 tablet by mouth daily.    . furosemide (LASIX) 40 MG tablet Take 40 mg by mouth daily as needed. Water gain    . Glucosamine-Chondroitin (GLUCOSAMINE CHONDR COMPLEX PO) Take 2 capsules by mouth daily.    . hydrochlorothiazide (HYDRODIURIL) 25 MG tablet Take 25 mg by mouth daily.    Marland Kitchen losartan (COZAAR) 100 MG tablet Take 100 mg by mouth daily.    Marland Kitchen MEGARED OMEGA-3 KRILL OIL 500 MG CAPS Take 1 tablet by mouth daily.    . metoprolol tartrate (LOPRESSOR) 25 MG tablet Take 25 mg by mouth 2 (two) times daily.     Marland Kitchen  omeprazole (PRILOSEC) 20 MG capsule Take 20 mg by mouth daily.    . potassium gluconate 595 MG TABS tablet Take 550 mg by mouth daily.     Marland Kitchen triamcinolone cream (KENALOG) 0.1 % Apply 1 application topically 3 (three) times daily.    Marland Kitchen triamcinolone lotion (KENALOG) 0.1 % Apply 1 application topically 3 (three) times daily. Alternates with cream    . vitamin B-12 (CYANOCOBALAMIN) 1000 MCG tablet Take 1,000 mcg by mouth daily.    . Pantoprazole Sodium (PROTONIX PO) Take 20 mg  by mouth as needed. OTC     No facility-administered medications prior to visit.       Past Surgical History:  Procedure Laterality Date  . ABDOMINAL HYSTERECTOMY  1983  . ANKLE SURGERY Left 1993  . ANTERIOR CRUCIATE LIGAMENT REPAIR Left 2013  . BREAST SURGERY     LEFT SIDE BIOPSY 1989  . CHOLECYSTECTOMY  1990  . COLONOSCOPY    . ESOPHAGOGASTRODUODENOSCOPY    . HAND SURGERY Left 2011  . QUADRICEPS TENDON REPAIR  07/16/2012   Procedure: REPAIR QUADRICEP TENDON;  Surgeon: Raymon Mutton, MD;  Location: [facility] OR;  Service: Orthopedics;  Laterality: Left;  open repair of VMO   . quiadricp  10'21/2012  . 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 TUBAL LIGATION  1974  . vasculitic eruption of legs  1985      Past Medical History:  Diagnosis Date  . Anxiety   . Arthritis   . Back pain    reason unknown  . Cataract    bilateral immature  . Diarrhea   . Diverticulosis   . GERD (gastroesophageal reflux disease)    takes Omeprazole daily  . Headache(784.0)    occasionally  . Hiatal hernia   . History of blood transfusion    no abnormal reaction noted  . History of bronchitis 2014  . History of colon polyps   . History of shingles   . Hypertension    takes Metoprolol,Amlodipine and Losartan daily  . Internal hemorrhoids   . Joint pain   . Joint swelling   . Lung nodule   . Nocturia   . Numbness    hands occasionally  . Osteoarthritis   . Peripheral edema    takes HCTZ daily and Furosemide daily as needed  . PONV (postoperative nausea and vomiting)   . Redness    to both lower extremities  . Shortness of breath     WITH EXERTION   . Sleep apnea    doesn't use cpap  . Urinary frequency   . Urinary urgency   . Venous insufficiency       Review of Systems  Constitutional: Negative for chills and fever.  Eyes:       Negative for changes in vision  Respiratory: Negative for cough, chest tightness, shortness of breath and wheezing.   Cardiovascular: Positive for leg swelling. Negative for chest pain and palpitations.  Gastrointestinal: Negative for abdominal distention, abdominal pain, constipation, diarrhea, nausea and vomiting.  Skin: Positive for wound.  Neurological: Negative for dizziness, weakness and light-headedness.      Objective:  BP (!) 154/72 (BP Location: Left Arm, Patient Position: Sitting, Cuff Size: Large)   Pulse 63   Temp 98.1 F (36.7 C) (Oral)   Resp 18   Ht 5\' 6"  (1.676 m)   Wt 292 lb (132.5 kg)   LMP 02/07/2012   SpO2 97%   BMI 47.13 kg/m  Nursing note and vital signs reviewed.  Physical Exam  Constitutional: She is oriented to person, place, and time. She appears well-developed and well-nourished. No distress.  Cardiovascular: Normal rate, regular rhythm, normal heart sounds and intact distal pulses.   Bilateral lower extremity edema with left greater than right. There is 1 cm x 1 cm open wound possible venous stasis ulcer and serous fluid discharge from several small areas around the medial aspects of the leg. The left leg has significant redness that appears consistent with a dermatitis.Unlikely cellulitis and no discharge. There are several tortured appearing veins. Pulses are 1+ bilaterally.  Pulmonary/Chest: Effort normal and breath sounds normal. No respiratory distress. She has no wheezes. She has no rales. She exhibits no tenderness.  Abdominal: Bowel sounds are normal. There is no tenderness. There is no rigidity, no rebound, no guarding, no CVA tenderness, no tenderness at McBurney's point and negative Murphy's sign. A hernia is present. Hernia confirmed  positive in the ventral area.  Neurological: She is alert and oriented to person, place, and time.  Skin: Skin is warm and dry.  Psychiatric: She has a normal mood and affect. Her behavior is normal. Judgment and thought content normal.       Assessment & Plan:   Problem List Items Addressed This Visit      Cardiovascular and Mediastinum   Essential hypertension    Blood pressure slightly elevated above goal 150/90 with current medication regimen no adverse side effects. Denies worst headache of life with no new symptoms of end organ damage. Encouraged to continue to monitor blood pressure at home and follow low-sodium diet. Continue current dosage of hydrochlorothiazide and losartan. Consider change to furosemide given lower extremity edema. Continue to monitor.        Digestive   GERD (gastroesophageal reflux disease)    Gastroesophageal reflux is stable with current medication regimen and no adverse side effects. Continue current dosage of omeprazole and continue to monitor.        Musculoskeletal and Integument   Venous stasis dermatitis of both lower extremities - Primary    Symptoms and exam consistent with venous stasis dermatitis of the left leg and possible venous stasis ulcer of the right leg. Continue triamcinolone as prescribed and consider referral to dermatology. Referral placed for wound care for venous stasis ulcer of the right lower extremity. Obtain complete metabolic profile and CBC. Consider additional Lasix to decrease lower extremity edema. Advised to keep clean with soap and water. Elevate legs when seated, follow sodium diet. Consider compression socks. May require graded compression or sequential compression device to assist with fluid removal.      Relevant Orders   Comprehensive metabolic panel (Completed)   CBC (Completed)   AMB referral to wound care center     Other   Prophylactic antibiotic for dental procedure indicated due to prior joint replacement      Multiple joint replacement surgeries with upcoming dental work. Refill amoxicillin for infection prophylaxis.      Relevant Medications   amoxicillin (AMOXIL) 500 MG capsule   Ventral hernia    Ventral hernia noted upon physical exam and previously evaluated by general surgery  with the recommendation for weight loss as current weight status will increased risk for surgical failure. Easily reducible with no gangrene or obstruction. Continue to monitor and follow-up with general surgery as planned.          I have discontinued Ms. Caponigro's Pantoprazole Sodium (PROTONIX PO). I am also having her start on amoxicillin. Additionally, I am having her maintain her furosemide, hydrochlorothiazide, metoprolol tartrate, triamcinolone lotion, Glucosamine-Chondroitin (GLUCOSAMINE CHONDR COMPLEX PO), acetaminophen, omeprazole, potassium gluconate, vitamin B-12, Calcium-Magnesium-Vitamin D (CALCIUM 1200+D3 PO), losartan, aspirin, MEGARED OMEGA-3 KRILL OIL, Biotin, and triamcinolone cream.   Meds ordered this encounter  Medications  . amoxicillin (AMOXIL) 500 MG capsule    Sig: Take 4 tablets by mouth 1 hour before dental procedure.    Dispense:  4 capsule    Refill:  1    Order Specific Question:   Supervising Provider    Answer:   Myrlene Broker [[id number]]    Time: 45 minutes of face-to-face time was spent with patient and >50% of the time was spent counseling and educating regarding chronic conditions of ventral hernia and treatment plans and care for her lower extremity edema.   Follow-up: Return in about 1 month (around 10/30/2016), or if symptoms worsen or fail to improve.  Jeanine Luz, FNP

## 2016-09-29 NOTE — Patient Instructions (Addendum)
Thank you for choosing [facility].  SUMMARY AND INSTRUCTIONS:  Keep your legs elevated.  Keep clean with soap and water.   You will receive a call to schedule with the wound center.   Ankle pumps throughout the day.  Diabetic compression socks to start.   Decrease sodium in your diet.   Medication:  Please continue to take your medication.   Your prescription(s) have been submitted to your pharmacy or been printed and provided for you. Please take as directed and contact our office if you believe you are having problem(s) with the medication(s) or have any questions.  Labs:  Please stop by the lab on the lower level of the building for your blood work. Your results will be released to MyChart (or called to you) after review, usually within 72 hours after test completion. If any changes need to be made, you will be notified at that same time.  1.) The lab is open from 7:30am to 5:30 pm Monday-Friday 2.) No appointment is necessary 3.) Fasting (if needed) is 6-8 hours after food and drink; black coffee and water are okay   Referrals:  Referrals have been made during this visit. You should expect to hear back from our schedulers in about 7-10 days in regards to establishing an appointment with the specialists we discussed.   Follow up:  If your symptoms worsen or fail to improve, please contact our office for further instruction, or in case of emergency go directly to the emergency room at the closest medical facility.    Venous Stasis or Chronic Venous Insufficiency Chronic venous insufficiency, also called venous stasis, is a condition that affects the veins in the legs. The condition prevents blood from being pumped through these veins effectively. Blood may no longer be pumped effectively from the legs back to the heart. This condition can range from mild to severe. With proper treatment, you should be able to continue with an active life. CAUSES  Chronic venous  insufficiency occurs when the vein walls become stretched, weakened, or damaged or when valves within the vein are damaged. Some common causes of this include:  High blood pressure inside the veins (venous hypertension).  Increased blood pressure in the leg veins from long periods of sitting or standing.  A blood clot that blocks blood flow in a vein (deep vein thrombosis).  Inflammation of a superficial vein (phlebitis) that causes a blood clot to form. RISK FACTORS Various things can make you more likely to develop chronic venous insufficiency, including:  Family history of this condition.  Obesity.  Pregnancy.  Sedentary lifestyle.  Smoking.  Jobs requiring long periods of standing or sitting in one place.  Being a certain age. Women in their 66s and 47s and men in their 53s are more likely to develop this condition. SIGNS AND SYMPTOMS  Symptoms may include:   Varicose veins.  Skin breakdown or ulcers.  Reddened or discolored skin on the leg.  Brown, smooth, tight, and painful skin just above the ankle, usually on the inside surface (lipodermatosclerosis).  Swelling. DIAGNOSIS  To diagnose this condition, your health care provider will take a medical history and do a physical exam. The following tests may be ordered to confirm the diagnosis:  Duplex ultrasound-A procedure that produces a picture of a blood vessel and nearby organs and also provides information on blood flow through the blood vessel.  Plethysmography-A procedure that tests blood flow.  A venogram, or venography-A procedure used to look at the veins  using X-ray and dye. TREATMENT The goals of treatment are to help you return to an active life and to minimize pain or disability. Treatment will depend on the severity of the condition. Medical procedures may be needed for severe cases. Treatment options may include:   Use of compression stockings. These can help with symptoms and lower the chances of the  problem getting worse, but they do not cure the problem.  Sclerotherapy-A procedure involving an injection of a material that "dissolves" the damaged veins. Other veins in the network of blood vessels take over the function of the damaged veins.  Surgery to remove the vein or cut off blood flow through the vein (vein stripping or laser ablation surgery).  Surgery to repair a valve. HOME CARE INSTRUCTIONS   Wear compression stockings as directed by your health care provider.  Only take over-the-counter or prescription medicines for pain, discomfort, or fever as directed by your health care provider.  Follow up with your health care provider as directed. SEEK MEDICAL CARE IF:   You have redness, swelling, or increasing pain in the affected area.  You see a red streak or line that extends up or down from the affected area.  You have a breakdown or loss of skin in the affected area, even if the breakdown is small.  You have an injury to the affected area. SEEK IMMEDIATE MEDICAL CARE IF:   You have an injury and open wound in the affected area.  Your pain is severe and does not improve with medicine.  You have sudden numbness or weakness in the foot or ankle below the affected area, or you have trouble moving your foot or ankle.  You have a fever or persistent symptoms for more than 2-3 days.  You have a fever and your symptoms suddenly get worse. MAKE SURE YOU:   Understand these instructions.  Will watch your condition.  Will get help right away if you are not doing well or get worse. This information is not intended to replace advice given to you by your health care provider. Make sure you discuss any questions you have with your health care provider. Document Released: 01/16/2007 Document Revised: 07/03/2013 Document Reviewed: 05/20/2013 Elsevier Interactive Patient Education  2017 [employer]

## 2016-09-29 NOTE — Assessment & Plan Note (Signed)
Blood pressure slightly elevated above goal 150/90 with current medication regimen no adverse side effects. Denies worst headache of life with no new symptoms of end organ damage. Encouraged to continue to monitor blood pressure at home and follow low-sodium diet. Continue current dosage of hydrochlorothiazide and losartan. Consider change to furosemide given lower extremity edema. Continue to monitor.

## 2016-09-29 NOTE — Assessment & Plan Note (Signed)
Ventral hernia noted upon physical exam and previously evaluated by general surgery with the recommendation for weight loss as current weight status will increased risk for surgical failure. Easily reducible with no gangrene or obstruction. Continue to monitor and follow-up with general surgery as planned.

## 2016-10-31 ENCOUNTER — Inpatient Hospital Stay: Admission: RE | Admit: 2016-10-31 | Payer: Medicare Other | Source: Ambulatory Visit

## 2016-10-31 ENCOUNTER — Encounter: Payer: Self-pay | Admitting: Pulmonary Disease

## 2016-10-31 ENCOUNTER — Telehealth: Payer: Self-pay | Admitting: Emergency Medicine

## 2016-10-31 ENCOUNTER — Ambulatory Visit
Admission: RE | Admit: 2016-10-31 | Discharge: 2016-10-31 | Disposition: A | Discharge status: Home / Self Care | Payer: Medicare Other | Source: Ambulatory Visit | Attending: Pulmonary Disease | Admitting: Pulmonary Disease

## 2016-10-31 ENCOUNTER — Ambulatory Visit (INDEPENDENT_AMBULATORY_CARE_PROVIDER_SITE_OTHER): Payer: Medicare Other | Admitting: Pulmonary Disease

## 2016-10-31 VITALS — BP 134/76 | HR 73 | Temp 97.7°F | Ht 68.0 in

## 2016-10-31 DIAGNOSIS — K439 Ventral hernia without obstruction or gangrene: Secondary | ICD-10-CM

## 2016-10-31 DIAGNOSIS — E6609 Other obesity due to excess calories: Secondary | ICD-10-CM

## 2016-10-31 DIAGNOSIS — Z6841 Body Mass Index (BMI) 40.0 and over, adult: Secondary | ICD-10-CM

## 2016-10-31 DIAGNOSIS — IMO0001 Reserved for inherently not codable concepts without codable children: Secondary | ICD-10-CM

## 2016-10-31 DIAGNOSIS — R0600 Dyspnea, unspecified: Secondary | ICD-10-CM

## 2016-10-31 DIAGNOSIS — I872 Venous insufficiency (chronic) (peripheral): Secondary | ICD-10-CM | POA: Diagnosis not present

## 2016-10-31 DIAGNOSIS — R918 Other nonspecific abnormal finding of lung field: Secondary | ICD-10-CM

## 2016-10-31 DIAGNOSIS — R609 Edema, unspecified: Secondary | ICD-10-CM

## 2016-10-31 DIAGNOSIS — I10 Essential (primary) hypertension: Secondary | ICD-10-CM

## 2016-10-31 DIAGNOSIS — R9389 Abnormal findings on diagnostic imaging of other specified body structures: Secondary | ICD-10-CM

## 2016-10-31 DIAGNOSIS — R938 Abnormal findings on diagnostic imaging of other specified body structures: Secondary | ICD-10-CM

## 2016-10-31 NOTE — Telephone Encounter (Signed)
I can not accept - I am not accepting new patient.s

## 2016-10-31 NOTE — Progress Notes (Signed)
Subjective:     Patient ID: [name], female   DOB: 10-19-1940, 76 y.o.   MRN: [id number]  HPI  ~  December 22, 2015:  Initial pulmonary consult by SN>      54 y/o WF referred by Dr. Delorise Jackson from [facility] for a pulmonary evaluation>  Pt describes an episode of bronchitis in Feb2017 characterized by cough, beige sput/ no hemoptysis, and incr SOB w/ wheezing;  She was treated w/ Doxy & Pred w/ improvement;  She knows that her dyspnea is likely from her weight & physical deconditioning/ lack of exercise;  CXR obtained & showed mult pulm nodules;  Subseq CT Chest 11/17/15 at [facility] in [place] showed norm heart size, several bilateral subcentimeter nodules more numerous on the right w/ the largest measuring 5mm in the RUL, no suspicious spiculated or ground glass nodules, no adenopathy, mild thoracic spondylosis, no other lesions... She has no known hx cancer, had benign breast biopsy in 1989...  Smoking Hx>  She is a never smoker, but had second hand exposure from parents, ex-husb (who died from lung ca), & boyfriend who recently quit...  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  Old CXR in Epic/PACS system 10/22/13>  Norm heart size, mild pleuro-parenchymal scarring at lung bases bilat, NAD & no change from comparison film in 2013, DJD in Tspine...   EKG 10/22/13>  NSR, rate77, poor R progression V1-3, no acute STTWA...  CXR (I cannot yet access the disc to view the CXR file from [facility])  CT Chest 11/17/15 at [facility] in [place] showed norm heart size, several bilateral subcentimeter nodules more numerous on the right w/ the largest measuring 5mm in the RUL, no suspicious spiculated or ground glass nodules, no adenopathy, mild thoracic spondylosis, no other lesions IMP >>     Abn CXR/ CT Chest Feb2017 in [facility] showing bilat subcentimeter pulm nodules, no known underlying cancer, she is a never smoker; plan is to repeat CT Chest after 64mo interval=> sched for 04/2016...    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    Morbid Obesity w/ OSA & prob restrictive lung dis=> we reviewed the importance of DIET/ EXERCISE/ Wt reduction strategies...    Cardiovasc issues>  HBP, Venous insuffic/ stasis changes/ edema in LEs    Medical  issues> HH/GERD, colon polyps, DJD w/ bilat TKRs PLAN >>     We discussed her episode of bronchitis 2/17 (now resolved);  We reviewed her DOE likely due to her obesity/ lack of physical exercise/ deconditioning and she needs to get motivated to lose the weight & increase her exercise program;  We held-off on doing PFTs at this point while she gets on track w/ diet & exercise;  She is a never smoker w/ several bilat <82mm pulm nodules and the  plan is to follow radiologies rec for repeat CT Chest in 41mo=> due Aug2017... She will call for any breathing issues in the interim.   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    Morbid Obesity w/ OSA & prob restrictive lung dis=> we reviewed the importance of DIET/ EXERCISE/ Wt reduction strategies...    Cardiovasc issues>  HBP, Venous insuffic/ stasis changes/ edema in LEs    Medical issues> HH/GERD, colon polyps, DJD w/ bilat TKRs, calcif left breast lesion Exam shows Afeb, VSS, O2sat=99% on RA at rest; Wt=301, 5'8"Tall, BMI=46;  Heent- neg, mallampati2;  Chest- clear w/o w/r/r;  Heart- RR w/o m/r/g;  Abd- obese, panniculus, nontender;  Ext- VI & stasis changes;  Neuro- intact.   LABS from [facility] 03/2016>  Chol 177 & FLP parameters at at goals;  Chems- wnl w/ BS=100, Cr=0.95;  CBC- sl anemia w/ Hg=11.8;  TSH=1.71 IMP/PLAN>>  [name] is stable- same amt of dyspnea but hasn't lost any wt & not exercising, we reviewed the importance of diet & exercise for wt reduction;  We decided to wait til her next OV for f/u CT Chest;  We will refer her to GI- DrPyrtle for abd wall (incisional hernia) & colonoscopy due. Note: >50% of this 25 min visit was spent  in counseling & coordination of care...   ~  October 31, 2016:  71mo ROV & pulmonary follow up visit>  [name] has established w/ [facility]- Dr.SBurns & PA-GCalone>  She returns today 7 had her f/u CT Chest this AM=> no change in the 4mm RML nodule or the scattered 2-39mm nodules in upper lobes;  She has VV/ VI which is improved on Rx from wound clinic Rx; she saw a vascular spec in [place] who rec vein ablation but she is reluctant & we discussed 2nd opinion from [facility] here in [place]... See prob list above... Exam shows Afeb, VSS, O2sat=99% on RA at rest; Wt=301, 5'8"Tall, BMI=46;  Heent- neg, mallampati2;  Chest- clear w/o w/r/r;  Heart- RR w/o m/r/g;  Abd- obese, panniculus, nontender;  Ext- VI & stasis changes;  Neuro- intact.   CT Chest 10/31/16>  Norm heart  size, atherosclerotic calcif in Ao, dominant 4mm nodule in RML- unchanged, additional scat nodules betw 2-59mm in both upper lobes- unchanged, mild atx in RML/ RLL, mild hepatic steatosis & vasc calcif in abd, chr rim-calcif lesion in left breast- prob fat necrosis & chronic, degen changes in Tspine...  IMP/PLAN>>  CT is stable & she is reassured & we will continue to follow;  We again reviewed the need for diet, exercise, wt reduction; plan rov 54mo...     Past Medical History:  Diagnosis Date  . Anxiety   . Arthritis   . Back pain    reason unknown  . Cataract    bilateral immature  . Diarrhea   . Diverticulosis   . GERD (gastroesophageal reflux disease)    takes Omeprazole daily  . Headache(784.0)    occasionally  . Hiatal hernia   . History of blood transfusion    no abnormal reaction noted  . History of bronchitis 2014  . History of colon polyps   . History of shingles   . Hypertension    takes Metoprolol,Amlodipine and Losartan daily  . Internal hemorrhoids   . Joint pain   . Joint swelling   . Lung nodule   . Nocturia   . Numbness    hands occasionally  . Osteoarthritis   . Peripheral edema    takes HCTZ daily  and Furosemide daily as needed  . PONV (postoperative nausea and vomiting)   . Redness    to both lower extremities  . Shortness of breath    WITH EXERTION   . Sleep apnea    doesn't use cpap  . Urinary frequency   . Urinary urgency   . Venous insufficiency     Past Surgical History:  Procedure Laterality Date  . ABDOMINAL HYSTERECTOMY  1983  . ANKLE SURGERY Left 1993  . ANTERIOR CRUCIATE LIGAMENT REPAIR Left 2013  . BREAST SURGERY     LEFT SIDE BIOPSY 1989  . CHOLECYSTECTOMY  1990  . COLONOSCOPY    . ESOPHAGOGASTRODUODENOSCOPY    . HAND SURGERY Left 2011  . QUADRICEPS TENDON REPAIR  07/16/2012   Procedure: REPAIR QUADRICEP TENDON;  Surgeon: Raymon Mutton, MD;  Location: [facility] OR;  Service: Orthopedics;  Laterality: Left;  open repair of VMO   . quiadricp  10'21/2012  . 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 TUBAL LIGATION  1974  . vasculitic eruption of legs  1985    Outpatient Encounter Prescriptions as of 10/31/2016  Medication Sig  . acetaminophen (TYLENOL) 500 MG tablet Take 500 mg by mouth every 6 (six) hours as needed. For pain  . aspirin 81 MG tablet Take 81 mg by mouth daily.  . Biotin 5000 MCG CAPS Take 1 capsule by mouth daily.  . Calcium-Magnesium-Vitamin D (CALCIUM 1200+D3 PO) Take 1 tablet by mouth daily.  . Cholecalciferol (VITAMIN D3) 10000 units capsule Take 10,000 Units by mouth daily.  . furosemide (LASIX) 40 MG tablet Take 40 mg by mouth daily as needed. Water gain  . Glucosamine-Chondroitin (GLUCOSAMINE CHONDR COMPLEX PO) Take 2 capsules by mouth daily.  . hydrochlorothiazide (HYDRODIURIL) 25 MG  tablet  Take 25 mg by mouth daily.  Marland Kitchen losartan (COZAAR) 100 MG tablet Take 100 mg by mouth daily.  Marland Kitchen MEGARED OMEGA-3 KRILL OIL 500 MG CAPS Take 1 tablet by mouth daily.  . metoprolol tartrate (LOPRESSOR) 25 MG tablet Take 25 mg by mouth 2 (two) times daily.   Marland Kitchen omeprazole (PRILOSEC) 20 MG capsule Take 20 mg by mouth daily.  . potassium gluconate 595 MG TABS tablet Take 550 mg by mouth daily.   Marland Kitchen triamcinolone cream (KENALOG) 0.1 % Apply 1 application topically 3 (three) times daily.  Marland Kitchen triamcinolone lotion (KENALOG) 0.1 % Apply 1 application topically 3 (three) times daily. Alternates with cream  . vitamin B-12 (CYANOCOBALAMIN) 1000 MCG tablet Take 1,000 mcg by mouth daily.  . [DISCONTINUED] amoxicillin (AMOXIL) 500 MG capsule Take 4 tablets by mouth 1 hour before dental procedure. (Patient not taking: Reported on 10/31/2016)   No facility-administered encounter medications on file as of 10/31/2016.     Allergies  Allergen Reactions  . Adhesive [Tape]     Skin is very sensitive    Immunization History  Administered Date(s) Administered  . Influenza, High Dose Seasonal PF 07/02/2014, 06/22/2015, 07/05/2016  . Influenza,inj,Quad PF,36+ Mos 06/27/2015  . Pneumococcal Conjugate-13 11/13/2014  . Pneumococcal-Unspecified 09/26/2005  . Tdap 08/04/2011  She is up-to-date on indicated vaccinations per PCP note- 11/30/15   Current Medications, Allergies, Past Medical History, Past Surgical History, Family History, and Social History were reviewed in [employer] record.   Review of Systems             All symptoms NEG except where BOLDED >>  Constitutional:  F/C/S, fatigue, anorexia, unexpected weight change. HEENT:  HA, visual changes, hearing loss, earache, nasal symptoms, sore throat, mouth sores, hoarseness. Resp:  cough, sputum, hemoptysis; SOB, tightness, wheezing. Cardio:  CP, palpit, DOE, orthopnea, edema. GI:  N/V/D/C, blood in stool; reflux, abd pain, distention,  gas. GU:  dysuria, freq, urgency, hematuria, flank pain, voiding difficulty. MS:  joint pain, swelling, tenderness, decr ROM; neck pain, back pain, etc. Neuro:  HA, tremors, seizures, dizziness, syncope, weakness, numbness, gait abn. Skin:  suspicious lesions or skin rash. Heme:  adenopathy, bruising, bleeding. Psyche:  confusion, agitation, sleep disturbance, hallucinations, anxiety, depression suicidal.   Objective:   Physical Exam       Vital Signs:  Reviewed...   General:  WD, obese, 76 y/o WF in NAD; alert & oriented; pleasant & cooperative... HEENT:  [place]/AT; Conjunctiva- pink, Sclera- nonicteric, EOM-wnl, PERRLA, EACs-clear, TMs-wnl; NOSE-clear; THROAT-clear & wnl.  Neck:  Supple w/ fair ROM; no JVD; normal carotid impulses w/o bruits; no thyromegaly or nodules palpated; no lymphadenopathy.  Chest:  Clear to P & A; without wheezes, rales, or rhonchi heard. Heart:  Regular Rhythm; norm S1 & S2 without murmurs, rubs, or gallops detected. Abdomen:  Obese, soft & nontender- no guarding or rebound; +panniculus, normal bowel sounds; no organomegaly or masses palpated. Ext:  decr ROM; s/p bilat TKRs, +arthritic changes; no varicose veins, +venous insuffic/ stasis changes/ edema;  Pulses intact w/o bruits. Neuro:  No focal neuro deficits; gait OK & balance OK. Derm:  No lesions noted; no rash etc. Lymph:  No cervical, supraclavicular, axillary, or inguinal adenopathy palpated.   Assessment:      IMP >>     Abn CXR/ CT Chest Feb2017 in [facility] showing bilat subcentimeter pulm nodules, no known underlying cancer, she is a never smoker; plan is to repeat CT Chest after 64mo  interval=> sched for 04/2016...    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    Morbid Obesity w/ OSA & prob restrictive lung dis=> we reviewed the importance of DIET/ EXERCISE/ Wt reduction strategies...    Cardiovasc issues>  HBP, Venous insuffic/ stasis changes/ edema in LEs    Medical issues> HH/GERD, colon polyps, DJD w/ bilat TKRs PLAN >>  12/22/15>   We discussed her episode of bronchitis 2/17 (now resolved);  We reviewed her DOE likely due to her obesity/ lack of physical exercise/ deconditioning and she needs to get motivated to lose the weight & increase her exercise program;  We held-off on doing PFTs at this point while she gets on track w/ diet & exercise;  She is a never smoker w/ several bilat <70mm pulm nodules and the plan is to follow radiologies rec for repeat CT Chest in 19mo=> due Aug2017... She will call for any breathing issues in the interim. 05/10/16>   [name] is stable- same amt of dyspnea but hasn't lost any wt & not exercising, we reviewed the importance of diet & exercise for wt reduction;  We decided to wait til her next OV for f/u CT Chest;  We will refer her to GI- DrPyrtle for abd wall (incisional hernia) & colonoscopy due 10/31/16>   CT is stable & she is reassured & we will continue to follow;  We again reviewed the need for diet, exercise, wt reduction; plan rov 51mo...      Plan:     Patient's Medications  New Prescriptions   No medications on file  Previous Medications   ACETAMINOPHEN (TYLENOL) 500 MG TABLET    Take 500 mg by mouth every 6 (six) hours as needed. For pain   ASPIRIN 81 MG TABLET    Take 81 mg by mouth daily.   BIOTIN 5000 MCG CAPS    Take 1 capsule by mouth daily.   CALCIUM-MAGNESIUM-VITAMIN D (CALCIUM 1200+D3 PO)    Take 1 tablet by mouth daily.   CHOLECALCIFEROL (VITAMIN D3) 10000 UNITS CAPSULE    Take 10,000 Units by mouth daily.   FUROSEMIDE (LASIX) 40 MG TABLET    Take 40 mg by mouth daily as needed. Water gain   GLUCOSAMINE-CHONDROITIN (GLUCOSAMINE CHONDR  COMPLEX PO)    Take 2 capsules by mouth daily.   HYDROCHLOROTHIAZIDE (HYDRODIURIL) 25 MG TABLET    Take 25 mg by mouth daily.   LOSARTAN (COZAAR) 100 MG TABLET    Take 100 mg by mouth daily.   MEGARED OMEGA-3 KRILL OIL 500 MG CAPS    Take 1 tablet by mouth daily.   METOPROLOL TARTRATE (LOPRESSOR) 25 MG TABLET    Take 25 mg by mouth 2 (two) times daily.    OMEPRAZOLE (PRILOSEC) 20 MG CAPSULE    Take 20 mg by mouth daily.   POTASSIUM GLUCONATE 595 MG TABS TABLET    Take 550 mg by mouth daily.    TRIAMCINOLONE CREAM (KENALOG) 0.1 %    Apply 1 application topically 3 (three) times daily.   TRIAMCINOLONE LOTION (KENALOG) 0.1 %    Apply 1 application topically 3 (three) times daily. Alternates with cream   VITAMIN  B-12 (CYANOCOBALAMIN) 1000 MCG TABLET    Take 1,000 mcg by mouth daily.  Modified Medications   No medications on file  Discontinued Medications   AMOXICILLIN (AMOXIL) 500 MG CAPSULE    Take 4 tablets by mouth 1 hour before dental procedure.

## 2016-10-31 NOTE — Patient Instructions (Signed)
Today we updated your med list in our EPIC system...    Continue your current medications the same...  We reviewed the results of today's f/u CT Chest and the tiny nodules are stable and the radiologist indicates that the largest is only 4mm in size & unchanged (or sl smaller) than previous indicating that it is benign... We can always recheck a scan if necessary but I feel we can follow this via CXR going forward...  Let's get on track w/ our diet/ exercise/ & weight reduction program!!!  Call for any questions or if I can be of service in any way...  Let's plan a follow up visit in about 6months, sooner if needed for breathing problems.Marland Kitchen..Marland Kitchen

## 2016-10-31 NOTE — Telephone Encounter (Signed)
Pt wants to know if you would be willing to except her as a new patient. [place]. Please advise thanks.

## 2016-11-01 NOTE — Telephone Encounter (Signed)
Called patient and left VM letting her know to give us a call back. Thanks.

## 2016-11-04 ENCOUNTER — Telehealth: Payer: Self-pay | Admitting: Emergency Medicine

## 2016-11-07 NOTE — Telephone Encounter (Signed)
error 

## 2016-12-19 ENCOUNTER — Encounter: Payer: Medicare Other | Admitting: Surgery

## 2016-12-19 ENCOUNTER — Encounter (HOSPITAL_COMMUNITY): Payer: Medicare Other

## 2016-12-21 ENCOUNTER — Encounter: Payer: Self-pay | Admitting: Surgery

## 2016-12-29 ENCOUNTER — Other Ambulatory Visit: Payer: Self-pay | Admitting: *Deleted

## 2016-12-29 DIAGNOSIS — R609 Edema, unspecified: Secondary | ICD-10-CM

## 2016-12-29 DIAGNOSIS — I872 Venous insufficiency (chronic) (peripheral): Secondary | ICD-10-CM

## 2017-01-02 ENCOUNTER — Ambulatory Visit (INDEPENDENT_AMBULATORY_CARE_PROVIDER_SITE_OTHER): Payer: Medicare Other | Admitting: Surgery

## 2017-01-02 ENCOUNTER — Inpatient Hospital Stay (HOSPITAL_COMMUNITY): Admission: RE | Admit: 2017-01-02 | Payer: Medicare Other | Source: Ambulatory Visit

## 2017-01-02 ENCOUNTER — Ambulatory Visit (HOSPITAL_COMMUNITY)
Admission: RE | Admit: 2017-01-02 | Discharge: 2017-01-02 | Disposition: A | Discharge status: Home / Self Care | Payer: Medicare Other | Source: Ambulatory Visit | Attending: Surgery | Admitting: Surgery

## 2017-01-02 ENCOUNTER — Encounter: Payer: Self-pay | Admitting: Surgery

## 2017-01-02 VITALS — BP 167/74 | HR 62 | Temp 97.8°F | Resp 18 | Ht 68.0 in | Wt 292.0 lb

## 2017-01-02 DIAGNOSIS — L97202 Non-pressure chronic ulcer of unspecified calf with fat layer exposed: Secondary | ICD-10-CM

## 2017-01-02 DIAGNOSIS — I872 Venous insufficiency (chronic) (peripheral): Secondary | ICD-10-CM | POA: Insufficient documentation

## 2017-01-02 DIAGNOSIS — R609 Edema, unspecified: Secondary | ICD-10-CM | POA: Insufficient documentation

## 2017-01-02 DIAGNOSIS — R936 Abnormal findings on diagnostic imaging of limbs: Secondary | ICD-10-CM | POA: Insufficient documentation

## 2017-01-02 DIAGNOSIS — I83002 Varicose veins of unspecified lower extremity with ulcer of calf: Secondary | ICD-10-CM | POA: Diagnosis not present

## 2017-01-02 NOTE — Progress Notes (Signed)
[facility]  Patient name: [name] MRN: [id number] DOB: 1941-07-12 Sex: female   REFERRING PROVIDER:    Dr. Andree Coss   REASON FOR CONSULT:    Venous stasis  HISTORY OF PRESENT ILLNESS:   [name] is a 76 y.o. female, who is Referred today for evaluation of venous stasis ulcers.  The patient states that she has been dealing with swelling in her legs all of her life.  She also has other family members who have similar issues with swelling.  For the past several weeks she has noticed weeping in her right leg.  She has also developed 2 small ulcers.  These have been managed at the wound center.  She has never worn compression stockings however she does work tight grips that have helped.  She does get some benefit from diuretics.  She is also cut back on her salt intake.  The patient is a nonsmoker.  She is medically managed for hypertension.    PAST MEDICAL HISTORY    Past Medical History:  Diagnosis Date  . Anemia   . Anxiety   . Arthritis   . Back pain    reason unknown  . Cataract    bilateral immature  . Diarrhea   . Diverticulosis   . GERD (gastroesophageal reflux disease)    takes Omeprazole daily  . Headache(784.0)    occasionally  . Hiatal hernia   . History of blood transfusion    no abnormal reaction noted  . History of bronchitis 2014  . History of colon polyps   . History of shingles   . Hypertension    takes Metoprolol,Amlodipine and Losartan daily  . Internal hemorrhoids   . Joint pain   . Joint swelling   . Lung nodule   . Nocturia   . Numbness    hands occasionally  . Osteoarthritis   . Peripheral edema    takes HCTZ daily and Furosemide daily as needed  . PONV (postoperative nausea and vomiting)   . Redness    to both lower extremities  . Shortness of breath    WITH EXERTION   . Sleep apnea    doesn't use cpap  . Urinary frequency   . Urinary urgency   . Venous  insufficiency      FAMILY HISTORY   Family History  Problem Relation Age of Onset  . Lymphoma Father   . Leukemia Father   . Heart attack Father   . Hypertension Mother   . Emphysema Mother   . Heart disease Sister   . Rashes / Skin problems Sister   . Heart failure Sister   . Kidney disease Sister   . Heart disease Maternal Grandfather   . Heart disease Paternal Grandmother   . Heart disease Paternal Grandfather     SOCIAL HISTORY:   Social History   Social History  . Marital status: Single    Spouse name: N/A  . Number of children: 3  . Years of education: 14   Occupational History  . retired    Social History Main Topics  . Smoking status: Never Smoker  . Smokeless tobacco: Never Used  . Alcohol use No  . Drug use: No  . Sexual activity: No   Other Topics Concern  . Not on file   Social History Narrative   Fun: Exercise class twice per week.    Denies abuse and feels safe at home.     ALLERGIES:  Allergies  Allergen Reactions  . Adhesive [Tape]     Skin is very sensitive    CURRENT MEDICATIONS:    Current Outpatient Prescriptions  Medication Sig Dispense Refill  . acetaminophen (TYLENOL) 500 MG tablet Take 500 mg by mouth every 6 (six) hours as needed. For pain    . aspirin 81 MG tablet Take 81 mg by mouth daily.    . Biotin 5000 MCG CAPS Take 1 capsule by mouth daily.    . Calcium-Magnesium-Vitamin D (CALCIUM 1200+D3 PO) Take 1 tablet by mouth daily.    . Cholecalciferol (VITAMIN D3) 10000 units capsule Take 10,000 Units by mouth daily.    . furosemide (LASIX) 40 MG tablet Take 40 mg by mouth daily as needed. Water gain    . Glucosamine-Chondroitin (GLUCOSAMINE CHONDR COMPLEX PO) Take 2 capsules by mouth daily.    . hydrochlorothiazide (HYDRODIURIL) 25 MG tablet Take 25 mg by mouth daily.    Marland Kitchen losartan (COZAAR) 100 MG tablet Take 100 mg by mouth daily.    Marland Kitchen MEGARED OMEGA-3 KRILL OIL 500 MG CAPS Take 1 tablet by mouth daily.    .  metoprolol tartrate (LOPRESSOR) 25 MG tablet Take 25 mg by mouth 2 (two) times daily.     Marland Kitchen omeprazole (PRILOSEC) 20 MG capsule Take 20 mg by mouth daily.    . potassium gluconate 595 MG TABS tablet Take 550 mg by mouth daily.     Marland Kitchen triamcinolone cream (KENALOG) 0.1 % Apply 1 application topically 3 (three) times daily.    Marland Kitchen triamcinolone lotion (KENALOG) 0.1 % Apply 1 application topically 3 (three) times daily. Alternates with cream    . vitamin B-12 (CYANOCOBALAMIN) 1000 MCG tablet Take 1,000 mcg by mouth daily.     No current facility-administered medications for this visit.     REVIEW OF SYSTEMS:    denotes positive finding,  denotes negative finding Cardiac  Comments:  Chest pain or chest pressure:    Shortness of breath upon exertion:    Short of breath when lying flat:    Irregular heart rhythm:        Vascular    Pain in calf, thigh, or hip brought on by ambulation:    Pain in feet at night that wakes you up from your sleep:     Blood clot in your veins:    Leg swelling:  x       Pulmonary    Oxygen at home:    Productive cough:     Wheezing:         Neurologic    Sudden weakness in arms or legs:     Sudden numbness in arms or legs:     Sudden onset of difficulty speaking or slurred speech:    Temporary loss of vision in one eye:     Problems with dizziness:         Gastrointestinal    Blood in stool:      Vomited blood:         Genitourinary    Burning when urinating:     Blood in urine:        Psychiatric    Major depression:         Hematologic    Bleeding problems:    Problems with blood clotting too easily:        Skin    Rashes or ulcers: x       Constitutional    Fever or chills:  PHYSICAL EXAM:   Vitals:   01/02/17 1138 01/02/17 1141  BP: (!) 157/75 (!) 167/74  Pulse: 62   Resp: 18   Temp: 97.8 F (36.6 C)   TempSrc: Oral   SpO2: 99%   Weight: 292 lb (132.5 kg)   Height:  (1.727 m)     GENERAL: The patient is a  well-nourished female, in no acute distress. The vital signs are documented above. CARDIAC: There is a regular rate and rhythm.  VASCULAR: Significant bilateral lower extremity edema PULMONARY: Nonlabored respirations  MUSCULOSKELETAL: There are no major deformities or cyanosis. NEUROLOGIC: No focal weakness or paresthesias are detected. SKIN: 2 small, less than 0.5 cm openings on the right leg without significant erythema PSYCHIATRIC: The patient has a normal affect.  STUDIES:   Venous reflux evaluation was performed here at our office.  This shows no evidence of DVT.  There is reflux noted in the right femoral vein as well as the right saphenous vein with diameter measures of 0.93 at the saphenofemoral junction.  She did have 2 Baker's cyst in the left popliteal fossa  ASSESSMENT and PLAN   Bilateral lower extremity edema: The patient does not have any reflux on the left side.  There is reflux in the proximal right great saphenous vein.  The patient is having her biggest issues with drainage and ulcers on the right leg.  This is likely a combination of lymphedema and venous insufficiency.  I think the most appropriate thing is to try and get her into compression.  We discussed 20-30 thigh-high or girdle length compression.  She will continue going to the wound center until her ulcers have healed.  She will then return to see [place] in 3 months for consideration of ablation of the right great saphenous vein to decrease the risk of ulcer recurrence.   Durene Cal, MD [facility] [phone] Pager [phone]

## 2017-01-03 ENCOUNTER — Encounter: Payer: Self-pay | Admitting: Emergency Medicine

## 2017-03-27 ENCOUNTER — Encounter: Payer: Self-pay | Admitting: Vascular Surgery

## 2017-04-03 ENCOUNTER — Encounter: Payer: Self-pay | Admitting: Vascular Surgery

## 2017-04-03 ENCOUNTER — Ambulatory Visit (INDEPENDENT_AMBULATORY_CARE_PROVIDER_SITE_OTHER): Payer: Medicare Other | Admitting: Vascular Surgery

## 2017-04-03 VITALS — BP 139/70 | HR 71 | Temp 97.7°F | Resp 16 | Ht 68.0 in | Wt 294.0 lb

## 2017-04-03 DIAGNOSIS — I83891 Varicose veins of right lower extremities with other complications: Secondary | ICD-10-CM | POA: Insufficient documentation

## 2017-04-03 DIAGNOSIS — I83893 Varicose veins of bilateral lower extremities with other complications: Secondary | ICD-10-CM

## 2017-04-03 NOTE — Progress Notes (Signed)
Subjective:     Patient ID: [name], female   DOB: [birth year], 76 y.o.   MRN: [id number]  HPI This 76 year old female returns for continued follow-up regarding her bilateral painful varicosities and swelling. Patient has a history of stasis ulcers in the right lower extremity was found to have gross reflux in the large right great saphenous vein and evaluated by Dr. Myra Gianotti 3 months ago. The patient has had chronic edema bilaterally left greater than right for many years as well as darkening of the skin but the ulcer on the right leg was the only ulceration she has experienced. She has tried elastic compression stockings. The ulceration on the right leg has now healed after going to the wound center. She has no history of DVT thrombophlebitis or bleeding.  Past Medical History:  Diagnosis Date  . Anemia   . Anxiety   . Arthritis   . Back pain    reason unknown  . Cataract    bilateral immature  . Diarrhea   . Diverticulosis   . GERD (gastroesophageal reflux disease)    takes Omeprazole daily  . Headache(784.0)    occasionally  . Hiatal hernia   . History of blood transfusion    no abnormal reaction noted  . History of bronchitis 2014  . History of colon polyps   . History of shingles   . Hypertension    takes Metoprolol,Amlodipine and Losartan daily  . Internal hemorrhoids   . Joint pain   . Joint swelling   . Lung nodule   . Nocturia   . Numbness    hands occasionally  . Osteoarthritis   . Peripheral edema    takes HCTZ daily and Furosemide daily as needed  . PONV (postoperative nausea and vomiting)   . Redness    to both lower extremities  . Shortness of breath    WITH EXERTION   . Sleep apnea    doesn't use cpap  . Urinary frequency   . Urinary urgency   . Venous insufficiency     Social History  Substance Use Topics  . Smoking status: Never Smoker  . Smokeless tobacco: Never Used  . Alcohol use No    Family History  Problem Relation Age of Onset  .  Lymphoma Father   . Leukemia Father   . Heart attack Father   . Hypertension Mother   . Emphysema Mother   . Heart disease Sister   . Rashes / Skin problems Sister   . Heart failure Sister   . Kidney disease Sister   . Heart disease Maternal Grandfather   . Heart disease Paternal Grandmother   . Heart disease Paternal Grandfather     Allergies  Allergen Reactions  . Adhesive [Tape] Other (See Comments) and Rash    Skin is very sensitive Skin is very sensitive Skin is very sensitive     Current Outpatient Prescriptions:  .  acetaminophen (TYLENOL) 500 MG tablet, Take 500 mg by mouth every 6 (six) hours as needed. For pain, Disp: , Rfl:  .  aspirin 81 MG tablet, Take 81 mg by mouth daily., Disp: , Rfl:  .  Biotin 5000 MCG CAPS, Take 1 capsule by mouth daily., Disp: , Rfl:  .  Calcium-Magnesium-Vitamin D (CALCIUM 1200+D3 PO), Take 1 tablet by mouth daily., Disp: , Rfl:  .  Cholecalciferol (VITAMIN D3) 10000 units capsule, Take 10,000 Units by mouth daily., Disp: , Rfl:  .  furosemide (LASIX) 40 MG tablet, Take 40  mg by mouth daily as needed. Water gain, Disp: , Rfl:  .  Glucosamine-Chondroitin (GLUCOSAMINE CHONDR COMPLEX PO), Take 2 capsules by mouth daily., Disp: , Rfl:  .  hydrochlorothiazide (HYDRODIURIL) 25 MG tablet, Take 25 mg by mouth daily., Disp: , Rfl:  .  losartan (COZAAR) 100 MG tablet, Take 100 mg by mouth daily., Disp: , Rfl:  .  MEGARED OMEGA-3 KRILL OIL 500 MG CAPS, Take 1 tablet by mouth daily., Disp: , Rfl:  .  metoprolol tartrate (LOPRESSOR) 25 MG tablet, Take 25 mg by mouth 2 (two) times daily. , Disp: , Rfl:  .  omeprazole (PRILOSEC) 20 MG capsule, Take 20 mg by mouth daily., Disp: , Rfl:  .  potassium gluconate 595 MG TABS tablet, Take 550 mg by mouth daily. , Disp: , Rfl:  .  triamcinolone cream (KENALOG) 0.1 %, Apply 1 application topically 3 (three) times daily., Disp: , Rfl:  .  triamcinolone lotion (KENALOG) 0.1 %, Apply 1 application topically 3 (three)  times daily. Alternates with cream, Disp: , Rfl:  .  vitamin B-12 (CYANOCOBALAMIN) 1000 MCG tablet, Take 1,000 mcg by mouth daily., Disp: , Rfl:   Vitals:   04/03/17 1338 04/03/17 1342  BP: (!) 179/77 139/70  Pulse: 71 71  Resp: 16   Temp: 97.7 F (36.5 C)   SpO2: 98%   Weight: 294 lb (133.4 kg)   Height: 5\' 8"  (1.727 m)     Body mass index is 44.7 kg/m.         Review of Systems Denies chest pain, dyspnea on exertion, PND, orthopnea, hemoptysis    Objective:   Physical Exam BP 139/70 (BP Location: Left Arm, Patient Position: Sitting, Cuff Size: Large)   Pulse 71   Temp 97.7 F (36.5 C)   Resp 16   Ht 5\' 8"  (1.727 m)   Wt 294 lb (133.4 kg)   LMP 02/07/2012   SpO2 98%   BMI 44.70 kg/m   Gen. obese female no apparent distress alert and oriented 3 Lungs no rhonchi or wheezing Right leg with ulceration distal third anteriorly which appears recently healed. Residual areas about 2-3 mm in size. Hypertrophic skin lower half right leg with chronic 1-2+ edema. No bulging varicosities noted.  Left leg with similar but worse hypertrophic scan with chronic 2+ edema and calf and ankle but no active ulceration. 2+ dorsalis pedis pulse palpable.  I reviewed the bilateral venous reflux exam performed 01/02/2017 which reveals gross reflux and an enlarged right great saphenous vein as well as some deep vein reflux The left great saphenous vein is also quite enlarged but no reflux was demonstrated  Today I performed a bedside SonoSite ultrasound exam confirming the above findings    Assessment:     Gross reflux right great saphenous vein with recently healed venous ulcer with severe skin changes-CEAP5 and chronic edema and pain    Plan:     #1 patient needs laser ablation right great saphenous vein #2 she may well require laser ablation left great saphenous vein in the near future although reflux is not demonstrated this time on ultrasound We will proceed with  precertification to treat the right leg present time for hopefully prevention of further ulceration an improvement in her swelling

## 2017-04-03 NOTE — Progress Notes (Signed)
Vitals:   04/03/17 1338  BP: (!) 179/77  Pulse: 71  Resp: 16  Temp: 97.7 F (36.5 C)  SpO2: 98%  Weight: 294 lb (133.4 kg)  Height: 5\' 8"  (1.727 m)

## 2017-04-04 ENCOUNTER — Ambulatory Visit: Payer: Medicare Other | Admitting: Vascular Surgery

## 2017-04-05 ENCOUNTER — Other Ambulatory Visit: Payer: Self-pay | Admitting: *Deleted

## 2017-04-05 DIAGNOSIS — I83891 Varicose veins of right lower extremities with other complications: Secondary | ICD-10-CM

## 2017-04-17 ENCOUNTER — Encounter: Payer: Self-pay | Admitting: Vascular Surgery

## 2017-04-17 ENCOUNTER — Ambulatory Visit (INDEPENDENT_AMBULATORY_CARE_PROVIDER_SITE_OTHER): Payer: Medicare Other | Admitting: Vascular Surgery

## 2017-04-17 VITALS — BP 179/80 | HR 72 | Temp 97.9°F | Resp 16 | Ht 68.0 in | Wt 292.0 lb

## 2017-04-17 DIAGNOSIS — I83891 Varicose veins of right lower extremities with other complications: Secondary | ICD-10-CM

## 2017-04-17 HISTORY — PX: ENDOVENOUS ABLATION SAPHENOUS VEIN W/ LASER: SUR449

## 2017-04-17 NOTE — Progress Notes (Signed)
Vitals:   04/17/17 1421  BP: (!) 180/75  Pulse: 73  Resp: 16  Temp: 97.9 F (36.6 C)  SpO2: 99%  Weight: 292 lb (132.5 kg)  Height: 5\' 8"  (1.727 m)

## 2017-04-17 NOTE — Progress Notes (Signed)
Subjective:     Patient ID: Charletta CousinGlenda Blakley, female   DOB: 10/09/1940, 76 y.o.   MRN: [id number]  HPI This 76 year old female had laser ablation of the right great saphenous vein from the proximal calf to near the saphenofemoral junction performed under local tumescent anesthesia. A total of 2100 J of energy was utilized. She tolerated the procedure well.  Review of Systems     Objective:   Physical Exam BP (!) 179/80 (BP Location: Left Arm, Patient Position: Sitting, Cuff Size: Large)   Pulse 72   Temp 97.9 F (36.6 C)   Resp 16   Ht 5\' 8"  (1.727 m)   Wt 292 lb (132.5 kg)   LMP 02/07/2012   SpO2 99%   BMI 44.40 kg/m        Assessment:     Well-tolerated laser ablation right great saphenous vein performed under local tumescent anesthesia    Plan:     Return in 1 week for venous duplex exam to confirm closure right great saphenous vein

## 2017-04-17 NOTE — Progress Notes (Signed)
Laser Ablation Procedure    Date: 04/17/2017   [name] DOB:12/12/1940  Consent signed: Yes    Surgeon:  Dr. Quita SkyeJames D. Hart RochesterLawson  Procedure: Laser Ablation: right Greater Saphenous Vein  BP (!) 179/80 (BP Location: Left Arm, Patient Position: Sitting, Cuff Size: Large)   Pulse 72   Temp 97.9 F (36.6 C)   Resp 16   Ht 5\' 8"  (1.727 m)   Wt 292 lb (132.5 kg)   LMP 02/07/2012   SpO2 99%   BMI 44.40 kg/m   Tumescent Anesthesia: 450 cc 0.9% NaCl with 50 cc Lidocaine HCL with 1% Epi and 15 cc 8.4% NaHCO3  Local Anesthesia: 7 cc Lidocaine HCL and NaHCO3 (ratio 2:1)  Pulsed Mode: 15 watts, 500ms delay, 1.0 duration  Total Energy: 2185 Joules              Total Pulses:  146              Total Time: 2:26       Patient tolerated procedure well    Description of Procedure:  After marking the course of the secondary varicosities, the patient was placed on the operating table in the supine position, and the right leg was prepped and draped in sterile fashion.   Local anesthetic was administered and under ultrasound guidance the saphenous vein was accessed with a micro needle and guide wire; then the mirco puncture sheath was placed.  A guide wire was inserted saphenofemoral junction , followed by a 5 french sheath.  The position of the sheath and then the laser fiber below the junction was confirmed using the ultrasound.  Tumescent anesthesia was administered along the course of the saphenous vein using ultrasound guidance. The patient was placed in Trendelenburg position and protective laser glasses were placed on patient and staff, and the laser was fired at 15 watts continuous mode advancing 1-432mm/second for a total of 2185  joules.        Steri strip was applied to the IV insertion site and ABD pads and knee high compression stockings were applied.  Ace wrap bandages were applied over the right thigh and at the top of the saphenofemoral junction. Blood loss was less than 15 cc.  The  patient ambulated out of the operating room having tolerated the procedure well.

## 2017-04-19 ENCOUNTER — Telehealth: Payer: Self-pay | Admitting: *Deleted

## 2017-04-19 NOTE — Telephone Encounter (Signed)
Returning [name]'s earlier telephone voice message regarding compression dressing.  [name] is s/p endovenous laser ablation R GSV on 04-17-2017 by Josephina GipJames Lawson MD.  Due to the size of her legs, [name] is wearing knee high compression hose (right calf) and Ace bandage (right thigh) as compression dressing.  [name] states that she had to remove Ace wrap last night covering her right thigh because "it was miserable."  [name] states that ABD pads were damp (clear discharge) and she replaced those.  [name] states "weeping form her right leg has stopped this morning"  and she is still wearing her knee high compression hose.  Advised [name] to reapply Ace wrap to right thigh and put a Spanx garment or girdle over top of Ace bandage to hold it in place.  [name] verbalized understanding. Confirmed post LA duplex and VV FU appointment with [name] for 04-25-2017.

## 2017-04-24 ENCOUNTER — Encounter: Payer: Self-pay | Admitting: Vascular Surgery

## 2017-04-25 ENCOUNTER — Ambulatory Visit (HOSPITAL_COMMUNITY)
Admission: RE | Admit: 2017-04-25 | Discharge: 2017-04-25 | Disposition: A | Discharge status: Home / Self Care | Payer: Medicare Other | Source: Ambulatory Visit | Attending: Vascular Surgery | Admitting: Vascular Surgery

## 2017-04-25 ENCOUNTER — Encounter: Payer: Self-pay | Admitting: Vascular Surgery

## 2017-04-25 ENCOUNTER — Ambulatory Visit (INDEPENDENT_AMBULATORY_CARE_PROVIDER_SITE_OTHER): Payer: Medicare Other | Admitting: Vascular Surgery

## 2017-04-25 VITALS — BP 180/82 | HR 71 | Temp 98.1°F | Resp 18 | Ht 68.0 in | Wt 292.0 lb

## 2017-04-25 DIAGNOSIS — I82431 Acute embolism and thrombosis of right popliteal vein: Secondary | ICD-10-CM | POA: Insufficient documentation

## 2017-04-25 DIAGNOSIS — I83891 Varicose veins of right lower extremities with other complications: Secondary | ICD-10-CM | POA: Diagnosis present

## 2017-04-25 DIAGNOSIS — I82411 Acute embolism and thrombosis of right femoral vein: Secondary | ICD-10-CM | POA: Insufficient documentation

## 2017-04-25 NOTE — Progress Notes (Signed)
Subjective:     Patient ID: [name], female   DOB: 03/25/1941, 76 y.o.   MRN: [id number]  HPI This 76 year old female returns 1 week post-laser ablation right great saphenous vein from the proximal calf to near the saphenofemoral junction for chronic swelling and severe skin changes. She states the leg feels much better below the knee with less tightness and smoother skin. She has had some problems keeping her elastic compression stocking up because of the size of her thigh. She did take ibuprofen as instructed area  Past Medical History:  Diagnosis Date  . Anemia   . Anxiety   . Arthritis   . Back pain    reason unknown  . Cataract    bilateral immature  . Diarrhea   . Diverticulosis   . GERD (gastroesophageal reflux disease)    takes Omeprazole daily  . Headache(784.0)    occasionally  . Hiatal hernia   . History of blood transfusion    no abnormal reaction noted  . History of bronchitis 2014  . History of colon polyps   . History of shingles   . Hypertension    takes Metoprolol,Amlodipine and Losartan daily  . Internal hemorrhoids   . Joint pain   . Joint swelling   . Lung nodule   . Nocturia   . Numbness    hands occasionally  . Osteoarthritis   . Peripheral edema    takes HCTZ daily and Furosemide daily as needed  . PONV (postoperative nausea and vomiting)   . Redness    to both lower extremities  . Shortness of breath    WITH EXERTION   . Sleep apnea    doesn't use cpap  . Urinary frequency   . Urinary urgency   . Venous insufficiency     Social History  Substance Use Topics  . Smoking status: Never Smoker  . Smokeless tobacco: Never Used  . Alcohol use No    Family History  Problem Relation Age of Onset  . Lymphoma Father   . Leukemia Father   . Heart attack Father   . Hypertension Mother   . Emphysema Mother   . Heart disease Sister   . Rashes / Skin problems Sister   . Heart failure Sister   . Kidney disease Sister   . Heart disease  Maternal Grandfather   . Heart disease Paternal Grandmother   . Heart disease Paternal Grandfather     Allergies  Allergen Reactions  . Adhesive [Tape] Other (See Comments) and Rash    Skin is very sensitive Skin is very sensitive Skin is very sensitive     Current Outpatient Prescriptions:  .  acetaminophen (TYLENOL) 500 MG tablet, Take 500 mg by mouth every 6 (six) hours as needed. For pain, Disp: , Rfl:  .  aspirin 81 MG tablet, Take 81 mg by mouth daily., Disp: , Rfl:  .  Biotin 5000 MCG CAPS, Take 1 capsule by mouth daily., Disp: , Rfl:  .  Calcium-Magnesium-Vitamin D (CALCIUM 1200+D3 PO), Take 1 tablet by mouth daily., Disp: , Rfl:  .  Cholecalciferol (VITAMIN D3) 10000 units capsule, Take 10,000 Units by mouth daily., Disp: , Rfl:  .  furosemide (LASIX) 40 MG tablet, Take 40 mg by mouth daily as needed. Water gain, Disp: , Rfl:  .  Glucosamine-Chondroitin (GLUCOSAMINE CHONDR COMPLEX PO), Take 2 capsules by mouth daily., Disp: , Rfl:  .  hydrochlorothiazide (HYDRODIURIL) 25 MG tablet, Take 25 mg by mouth daily., Disp: ,  Rfl:  .  losartan (COZAAR) 100 MG tablet, Take 100 mg by mouth daily., Disp: , Rfl:  .  MEGARED OMEGA-3 KRILL OIL 500 MG CAPS, Take 1 tablet by mouth daily., Disp: , Rfl:  .  metoprolol tartrate (LOPRESSOR) 25 MG tablet, Take 25 mg by mouth 2 (two) times daily. , Disp: , Rfl:  .  omeprazole (PRILOSEC) 20 MG capsule, Take 20 mg by mouth daily., Disp: , Rfl:  .  potassium gluconate 595 MG TABS tablet, Take 550 mg by mouth daily. , Disp: , Rfl:  .  triamcinolone cream (KENALOG) 0.1 %, Apply 1 application topically 3 (three) times daily., Disp: , Rfl:  .  triamcinolone lotion (KENALOG) 0.1 %, Apply 1 application topically 3 (three) times daily. Alternates with cream, Disp: , Rfl:  .  vitamin B-12 (CYANOCOBALAMIN) 1000 MCG tablet, Take 1,000 mcg by mouth daily., Disp: , Rfl:   Vitals:   04/25/17 1402 04/25/17 1405  BP: (!) 194/83 (!) 180/82  Pulse: 71   Resp: 18    Temp: 98.1 F (36.7 C)   TempSrc: Oral   SpO2: 99%   Weight: 292 lb (132.5 kg)   Height: 5\' 8"  (1.727 m)     Body mass index is 44.4 kg/m.         Review of Systems Denies chest pain, dyspnea on exertion, PND, orthopnea, hemoptysis    Objective:   Physical Exam BP (!) 180/82 (BP Location: Left Arm, Patient Position: Sitting, Cuff Size: Large)   Pulse 71   Temp 98.1 F (36.7 C) (Oral)   Resp 18   Ht 5\' 8"  (1.727 m)   Wt 292 lb (132.5 kg)   LMP 02/07/2012   SpO2 99%   BMI 44.40 kg/m   Gen. obese female no apparent distress alert and oriented 3 Lungs no rhonchi or wheezing Cardiovascular regular rhythm no murmurs Right leg with severe hyperpigmentation lower half below the knee. 1+ chronic edema. No bulging varicosities noted. Mild tenderness to deep palpation over great saphenous vein and thigh.  The day I ordered a venous duplex exam the right leg which I reviewed and interpreted. There is no DVT. There is total closure of the greater saphenous vein from the midcalf to the upper thigh with partial closure up to the saphenofemoral junction     Assessment:     Successful laser ablation right great saphenous vein for gross reflux with pain and swelling and severe skin changes    Plan:     Patient does have an enlarged left great saphenous vein which has chronic swelling but skin changes are not as pronounced There is noted reflux demonstrated at this time Patient develops worsening symptoms or swelling in the left leg she will be in touch with us for repeat ultrasound otherwise return on a when necessary basis

## 2017-05-04 ENCOUNTER — Ambulatory Visit (INDEPENDENT_AMBULATORY_CARE_PROVIDER_SITE_OTHER)
Admission: RE | Admit: 2017-05-04 | Discharge: 2017-05-04 | Disposition: A | Discharge status: Home / Self Care | Payer: Medicare Other | Source: Ambulatory Visit | Attending: Pulmonary Disease | Admitting: Pulmonary Disease

## 2017-05-04 ENCOUNTER — Encounter: Payer: Self-pay | Admitting: Pulmonary Disease

## 2017-05-04 ENCOUNTER — Ambulatory Visit (INDEPENDENT_AMBULATORY_CARE_PROVIDER_SITE_OTHER): Payer: Medicare Other | Admitting: Pulmonary Disease

## 2017-05-04 VITALS — BP 130/80 | HR 65 | Temp 97.9°F | Ht 68.0 in | Wt 295.5 lb

## 2017-05-04 DIAGNOSIS — R9389 Abnormal findings on diagnostic imaging of other specified body structures: Secondary | ICD-10-CM

## 2017-05-04 DIAGNOSIS — R938 Abnormal findings on diagnostic imaging of other specified body structures: Secondary | ICD-10-CM

## 2017-05-04 DIAGNOSIS — R609 Edema, unspecified: Secondary | ICD-10-CM

## 2017-05-04 DIAGNOSIS — I10 Essential (primary) hypertension: Secondary | ICD-10-CM

## 2017-05-04 DIAGNOSIS — R918 Other nonspecific abnormal finding of lung field: Secondary | ICD-10-CM

## 2017-05-04 DIAGNOSIS — R0600 Dyspnea, unspecified: Secondary | ICD-10-CM | POA: Diagnosis not present

## 2017-05-04 DIAGNOSIS — Z6841 Body Mass Index (BMI) 40.0 and over, adult: Secondary | ICD-10-CM | POA: Diagnosis not present

## 2017-05-04 DIAGNOSIS — I872 Venous insufficiency (chronic) (peripheral): Secondary | ICD-10-CM

## 2017-05-04 NOTE — Patient Instructions (Signed)
Today we updated your med list in our EPIC system...    Continue your current medications the same...  I'm glad that your legs are improved- keep up the good work w/ NO SALT, elevation, support hose...  Today we did a follow up CXR...    We will contact you w/ the results when available...   Let's get on track w/ our diet, exercise program & wt reduction...  Call for any questions...  Let's plan a follow up visit in 32mo, sooner if needed for problems.Marland Kitchen..Marland Kitchen

## 2017-05-04 NOTE — Progress Notes (Signed)
Subjective:     Patient ID: [name], female   DOB: Nov 16, 1940, 76 y.o.   MRN: [id number]  HPI  ~  December 22, 2015:  Initial pulmonary consult by SN>      79 y/o WF referred by Dr. Delorise Jackson from [facility] for a pulmonary evaluation>  Pt describes an episode of bronchitis in Feb2017 characterized by cough, beige sput/ no hemoptysis, and incr SOB w/ wheezing;  She was treated w/ Doxy & Pred w/ improvement;  She knows that her dyspnea is likely from her weight & physical deconditioning/ lack of exercise;  CXR obtained & showed mult pulm nodules;  Subseq CT Chest 11/17/15 at [facility] in [place] showed norm heart size, several bilateral subcentimeter nodules more numerous on the right w/ the largest measuring 5mm in the RUL, no suspicious spiculated or ground glass nodules, no adenopathy, mild thoracic spondylosis, no other lesions... She has no known hx cancer, had benign breast biopsy in 1989...  Smoking Hx>  She is a never smoker, but had second hand exposure from parents, ex-husb (who died from lung ca), & boyfriend who recently quit...  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  Old CXR in Epic/PACS system 10/22/13>  Norm heart size, mild pleuro-parenchymal scarring at lung bases bilat, NAD & no change from comparison film in 2013, DJD in Tspine...   EKG 10/22/13>  NSR, rate77, poor R progression V1-3, no acute STTWA...  CXR (I cannot yet access the disc to view the CXR file from [facility])  CT Chest 11/17/15 at [facility] in [place] showed norm heart size, several bilateral subcentimeter nodules more numerous on the right w/ the largest measuring 5mm in the RUL, no suspicious spiculated or ground glass nodules, no adenopathy, mild thoracic spondylosis, no other lesions IMP >>     Abn CXR/ CT Chest Feb2017 in [facility] showing bilat subcentimeter pulm nodules, no known underlying cancer, she is a never smoker; plan is to repeat CT Chest after 105mo interval=> sched for 04/2016...    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    Morbid Obesity w/ OSA & prob restrictive lung dis=> we reviewed the importance of DIET/ EXERCISE/ Wt reduction strategies...    Cardiovasc issues>  HBP, Venous insuffic/ stasis changes/ edema in LEs    Medical  issues> HH/GERD, colon polyps, DJD w/ bilat TKRs PLAN >>     We discussed her episode of bronchitis 2/17 (now resolved);  We reviewed her DOE likely due to her obesity/ lack of physical exercise/ deconditioning and she needs to get motivated to lose the weight & increase her exercise program;  We held-off on doing PFTs at this point while she gets on track w/ diet & exercise;  She is a never smoker w/ several bilat <415mm pulm nodules and the  plan is to follow radiologies rec for repeat CT Chest in 5777mo=> due Aug2017... She will call for any breathing issues in the interim.   ~  May 10, 2016:  98mo ROV w/ SN>  [name] informs me that DrPrince passed away in [place] 8mo ago; she is followed by the [facility] in [place]; she is here for a follow up visit regarding the following problems>    Abn CXR/ CT Chest Feb2017 in [facility] showing bilat subcentimeter pulm nodules, largest=485mm in RUL, no known underlying cancer, she is a never smoker; plan is to repeat CT Chest after 77mo interval=>     Dyspnea is surely multifactorial w/ her weight (300+) & lack of exercise/ physical deconditioning playing a major roll    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    Morbid Obesity w/ OSA & prob restrictive lung dis=> we reviewed the importance of DIET/ EXERCISE/ Wt reduction strategies...    Cardiovasc issues>  HBP, Venous insuffic/ stasis changes/ edema in LEs    Medical issues> HH/GERD, colon polyps, abd wall hernia, DJD w/ bilat TKRs, calcif left breast lesion Exam shows Afeb, VSS, O2sat=99% on RA at rest; Wt=301, 5'8"Tall, BMI=46;  Heent- neg, mallampati2;  Chest- clear w/o w/r/r;  Heart- RR w/o m/r/g;  Abd- obese, panniculus, nontender;  Ext- VI & stasis changes;  Neuro- intact.   [facility]>  Chol 177 & FLP parameters at at goals;  Chems- wnl w/ BS=100, Cr=0.95;  CBC- sl anemia w/ Hg=11.8;  TSH=1.71 IMP/PLAN>>  [name] is stable- same amt of dyspnea but hasn't lost any wt & not exercising, we reviewed the importance of diet & exercise for wt reduction;  We decided to wait til her next OV for f/u CT Chest;  We will refer her to GI- DrPyrtle for abd wall (incisional hernia) & colonoscopy due. Note: >50% of this 25 min  visit was spent in counseling & coordination of care...  ~  October 31, 2016:  77mo ROV & pulmonary follow up visit>  [name] has established w/ [facility]- Dr.SBurns & PA-GCalone>  She returns today & had her f/u CT Chest this AM=> no change in the 4mm RML nodule or the scattered 2-473mm nodules in upper lobes;  She has VV/ VI which is improved on Rx from wound clinic Rx; she saw a vascular spec in [place] who rec vein ablation but she is reluctant & we discussed 2nd opinion from VVS here in [place]... See prob list above... Exam shows Afeb, VSS, O2sat=99% on RA at rest; Wt=301, 5'8"Tall, BMI=46;  Heent- neg, mallampati2;  Chest- clear w/o w/r/r;  Heart- RR w/o m/r/g;  Abd- obese, panniculus, nontender;  Ext- VI & stasis changes;  Neuro- intact.   CT Chest 10/31/16>  Norm heart size, atherosclerotic calcif in Ao, dominant 4mm nodule in RML- unchanged, additional scat nodules betw 2-75mm in both upper lobes- unchanged, mild atx in RML/ RLL, mild hepatic steatosis & vasc calcif in abd, chr rim-calcif lesion in left breast- prob fat necrosis & chronic, degen changes in Tspine...  IMP/PLAN>>  CT is stable & she is reassured & we will continue to follow;  We again reviewed the need for diet, exercise, wt reduction; plan rov 47mo...    ~  May 04, 2017:  47mo ROV & pulmonary follow up visit> [name] returns for pulm f/u visit- CT Chest 10/2016 showed no change in the 4mm index nodule in RML or the scattered 2-14mm nodules elsewhere;  In the meanwhile she saw [facility] for [facility] regarding her VV/ VI and had laser ablation of the right GSV for chr swelling & skin changes- she indicates feeling better at this point, less tightness & smoother skin; she is considering a similar procedure on the left leg but holding off for now... Her PCP is DrEggleston-Clark in [place] seen 10/2016, BP controlled, meds reviewed, noted that she is on VitD 10K daily from her "wound care physician"...    From the pulmonary standpoint she  notes that her breathing is OK but she notes DOE quickly w/ ambulation, no cough/ sput/ hemoptysis, denies CP/ palpit/ etc;  We discussed the need for diet/ exercise/ & wt reduction to help her DOE complaints...     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    Morbid Obesity w/ OSA & prob restrictive lung dis=> we reviewed the importance of DIET/ EXERCISE/ Wt reduction strategies...    Cardiovasc issues>  HBP, Venous insuffic/ stasis changes/ edema in LEs    Medical issues> HH/GERD, colon polyps, abd wall hernia, DJD w/ bilat TKRs, calcif left breast lesion  Exam shows Afeb, VSS, O2sat=98% on RA at rest; Wt=296, 5'8"Tall, BMI=45;  Heent- neg, mallampati2;  Chest- clear w/o w/r/r;  Heart- RR w/o m/r/g;  Abd- obese, panniculus, nontender;  Ext- VI & stasis changes;  Neuro- intact.   CXR 05/04/17>  Heart is at the upper lim of norm, clear lungs w/ one ~74mm nodule noted in the right mid lung (unchanged), degen changes in Tspine... IMP/PLAN>>  [name] is stable overall, she understands that her dyspnea is multifactorial & that poor conditioning/ lack of exercise are partially to blame; substantial improvement in her shortness of breath will require a commitment to DIET/ WEIGHT REDUCTION/ & EXERCISE; we will continue to follow CXRs and consider additional CT if needed...    Past Medical History:  Diagnosis Date  . Anemia   . Anxiety   . Arthritis   . Back pain    reason  unknown  . Cataract    bilateral immature  . Diarrhea   . Diverticulosis   . GERD (gastroesophageal reflux disease)    takes Omeprazole daily  . Headache(784.0)    occasionally  .  Hiatal hernia   . History of blood transfusion    no abnormal reaction noted  . History of bronchitis 2014  . History of colon polyps   . History of shingles   . Hypertension    takes Metoprolol,Amlodipine and Losartan daily  . Internal hemorrhoids   . Joint pain   . Joint swelling   . Lung nodule   . Nocturia   . Numbness    hands occasionally  . Osteoarthritis   . Peripheral edema    takes HCTZ daily and Furosemide daily as needed  . PONV (postoperative nausea and vomiting)   . Redness    to both lower extremities  . Shortness of breath    WITH EXERTION   . Sleep apnea    doesn't use cpap  . Urinary frequency   . Urinary urgency   . Venous insufficiency     Past Surgical History:  Procedure Laterality Date  . ABDOMINAL HYSTERECTOMY  1983  . ANKLE SURGERY Left 1993  . ANTERIOR CRUCIATE LIGAMENT REPAIR Left 2013  . BREAST SURGERY     LEFT SIDE BIOPSY 1989  . CHOLECYSTECTOMY  1990  . COLONOSCOPY    . ENDOVENOUS ABLATION SAPHENOUS VEIN W/ LASER Right 04/17/2017   endovenous laser ablation R GSV by Josephina Gip MD  . ESOPHAGOGASTRODUODENOSCOPY    . HAND SURGERY Left 2011  . QUADRICEPS TENDON REPAIR  07/16/2012   Procedure: REPAIR QUADRICEP TENDON;  Surgeon: Raymon Mutton, MD;  Location[facility] OR;  Service: Orthopedics;  Laterality: Left;  open repair of VMO   . quiadricp  10'21/2012  . 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 TUBAL  LIGATION  1974  . vasculitic eruption of legs  1985    Outpatient Encounter Prescriptions as of 05/04/2017  Medication Sig  . acetaminophen (TYLENOL) 500 MG tablet Take 500 mg by mouth every 6 (six) hours as needed. For pain  . aspirin 81 MG tablet Take 81 mg by mouth daily.  . Biotin 5000 MCG CAPS Take 1 capsule by mouth daily.  . Calcium-Magnesium-Vitamin D (CALCIUM 1200+D3 PO) Take 1 tablet by mouth daily.  . Cholecalciferol (VITAMIN D3) 10000 units capsule Take 10,000 Units by mouth daily.  . furosemide (LASIX) 40 MG tablet Take 40 mg by mouth as needed. Water gain   . Glucosamine-Chondroitin (GLUCOSAMINE CHONDR COMPLEX PO) Take 2 capsules by mouth daily.  . hydrochlorothiazide (HYDRODIURIL) 25 MG tablet Take 25 mg by mouth daily.  Marland Kitchen losartan (COZAAR) 100 MG tablet Take 100 mg by mouth daily.  Marland Kitchen MEGARED OMEGA-3 KRILL OIL 500 MG CAPS Take 1 tablet by mouth daily.  . metoprolol tartrate (LOPRESSOR) 25 MG tablet Take 25 mg by mouth 2 (two) times daily.   Marland Kitchen omeprazole (PRILOSEC) 20 MG capsule Take 20 mg by mouth daily.  . potassium gluconate 595 MG TABS tablet Take 550 mg by mouth daily.   Marland Kitchen triamcinolone cream (KENALOG) 0.1 % Apply 1 application topically 3 (three) times daily.  . vitamin B-12 (CYANOCOBALAMIN) 1000 MCG tablet Take 1,000 mcg by mouth daily.  . [DISCONTINUED] triamcinolone lotion (KENALOG) 0.1 % Apply 1 application topically  3 (three) times daily. Alternates with cream   No facility-administered encounter medications on file as of 05/04/2017.     Allergies  Allergen Reactions  . Adhesive [Tape] Other (See Comments) and Rash    Skin is very sensitive Skin is very sensitive Skin is very sensitive    Immunization History  Administered Date(s) Administered  . Influenza, High Dose Seasonal PF 07/02/2014, 06/22/2015, 07/05/2016  . Influenza,inj,Quad PF,36+ Mos 06/27/2015  . Pneumococcal Conjugate-13 11/13/2014  . Pneumococcal-Unspecified 09/26/2005  . Tdap 08/04/2011   She is up-to-date on indicated vaccinations per PCP note- 11/30/15   Current Medications, Allergies, Past Medical History, Past Surgical History, Family History, and Social History were reviewed in [employer] record.   Review of Systems             All symptoms NEG except where BOLDED >>  Constitutional:  F/C/S, fatigue, anorexia, unexpected weight change. HEENT:  HA, visual changes, hearing loss, earache, nasal symptoms, sore throat, mouth sores, hoarseness. Resp:  cough, sputum, hemoptysis; SOB, tightness, wheezing. Cardio:  CP, palpit, DOE, orthopnea, edema. GI:  N/V/D/C, blood in stool; reflux, abd pain, distention, gas. GU:  dysuria, freq, urgency, hematuria, flank pain, voiding difficulty. MS:  joint pain, swelling, tenderness, decr ROM; neck pain, back pain, etc. Neuro:  HA, tremors, seizures, dizziness, syncope, weakness, numbness, gait abn. Skin:  suspicious lesions or skin rash. Heme:  adenopathy, bruising, bleeding. Psyche:  confusion, agitation, sleep disturbance, hallucinations, anxiety, depression suicidal.   Objective:   Physical Exam       Vital Signs:  Reviewed...   General:  WD, obese, 76 y/o WF in NAD; alert & oriented; pleasant & cooperative... HEENT:  [place]/AT; Conjunctiva- pink, Sclera- nonicteric, EOM-wnl, PERRLA, EACs-clear, TMs-wnl; NOSE-clear; THROAT-clear & wnl.  Neck:  Supple w/ fair ROM; no JVD; normal carotid impulses w/o bruits; no thyromegaly or nodules palpated; no lymphadenopathy.  Chest:  Clear to P & A; without wheezes, rales, or rhonchi heard. Heart:  Regular Rhythm; norm S1 & S2 without murmurs, rubs, or gallops detected. Abdomen:  Obese, soft & nontender- no guarding or rebound; +panniculus, abd wall hernia, normal bowel sounds; no organomegaly or masses palpated. Ext:  decr ROM; s/p bilat TKRs, +arthritic changes; no varicose veins, +venous insuffic/ stasis changes/ edema;  Pulses intact w/o bruits. Neuro:  No focal neuro  deficits; gait OK & balance OK. Derm:  No lesions noted; no rash etc. Lymph:  No cervical, supraclavicular, axillary, or inguinal adenopathy palpated.   Assessment:      IMP >>     Abn CXR/ CT Chest Feb2017 in [facility] showing bilat subcentimeter pulm nodules, no known underlying cancer, she is a never smoker; plan is to repeat CT Chest after 40mo interval=> sched for 04/2016...    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    Morbid Obesity w/ OSA & prob restrictive lung dis=> we reviewed the importance of DIET/ EXERCISE/ Wt reduction strategies...    Cardiovasc issues>  HBP, Venous insuffic/ stasis changes/ edema in LEs    Medical issues> HH/GERD, colon polyps, abd wall hernia, DJD w/ bilat TKRs  PLAN >>  12/22/15>   We discussed her episode  of bronchitis 2/17 (now resolved);  We reviewed her DOE likely due to her obesity/ lack of physical exercise/ deconditioning and she needs to get motivated to lose the weight & increase her exercise program;  We held-off on doing PFTs at this point while she gets on track w/ diet & exercise;  She is a never smoker w/ several bilat <72mm pulm nodules and the plan is to follow radiologies rec for repeat CT Chest in 58mo=> due Aug2017... She will call for any breathing issues in the interim. 05/10/16>   Siboney is stable- same amt of dyspnea but hasn't lost any wt & not exercising, we reviewed the importance of diet & exercise for wt reduction;  We decided to wait til her next OV for f/u CT Chest;  We will refer her to GI- DrPyrtle for abd wall (incisional hernia) & colonoscopy due 10/31/16>   CT is stable & she is reassured & we will continue to follow;  We again  reviewed the need for diet, exercise, wt reduction; plan rov 79mo...  05/04/17>   [name] is stable overall, she understands that her dyspnea is multifactorial & that poor conditioning/ lack of exercise are partially to blame; substantial improvement in her shortness of breath will require a commitment to DIET/ WEIGHT REDUCTION/ & EXERCISE; we will continue to follow CXRs and consider additional CT if needed     Plan:     Patient's Medications  New Prescriptions   No medications on file  Previous Medications   ACETAMINOPHEN (TYLENOL) 500 MG TABLET    Take 500 mg by mouth every 6 (six) hours as needed. For pain   ASPIRIN 81 MG TABLET    Take 81 mg by mouth daily.   BIOTIN 5000 MCG CAPS    Take 1 capsule by mouth daily.   CALCIUM-MAGNESIUM-VITAMIN D (CALCIUM 1200+D3 PO)    Take 1 tablet by mouth daily.   CHOLECALCIFEROL (VITAMIN D3) 10000 UNITS CAPSULE    Take 10,000 Units by mouth daily.   FUROSEMIDE (LASIX) 40 MG TABLET    Take 40 mg by mouth as needed. Water gain    GLUCOSAMINE-CHONDROITIN (GLUCOSAMINE CHONDR COMPLEX PO)    Take 2 capsules by mouth daily.   HYDROCHLOROTHIAZIDE (HYDRODIURIL) 25 MG TABLET    Take 25 mg by mouth daily.   LOSARTAN (COZAAR) 100 MG TABLET    Take 100 mg by mouth daily.   MEGARED OMEGA-3 KRILL OIL 500 MG CAPS    Take 1 tablet by mouth daily.   METOPROLOL TARTRATE (LOPRESSOR) 25 MG TABLET    Take 25 mg by mouth 2 (two) times daily.    OMEPRAZOLE (PRILOSEC) 20 MG CAPSULE    Take 20 mg by mouth daily.   POTASSIUM GLUCONATE 595 MG TABS TABLET    Take 550 mg by mouth daily.    TRIAMCINOLONE CREAM (KENALOG) 0.1 %    Apply 1 application topically 3 (three) times daily.   VITAMIN B-12 (CYANOCOBALAMIN) 1000 MCG TABLET    Take 1,000 mcg by mouth daily.  Modified Medications   No medications on file  Discontinued Medications   TRIAMCINOLONE LOTION (KENALOG) 0.1 %    Apply 1 application topically 3 (three) times daily. Alternates with cream

## 2017-05-16 ENCOUNTER — Telehealth: Payer: Self-pay | Admitting: Pulmonary Disease

## 2017-05-16 NOTE — Telephone Encounter (Signed)
Notes recorded by Michele Mcalpine, MD on 05/05/2017 at 8:14 AM EDT Please notify patient>  CXR is stable-- heart at upper lim of norm, lungs essent clear & unchanged w/ one tiny 65mm right mid lung nodule, unchanged... We will continue to follow... ------ atc pt, no answer, vm full.  Wcb.

## 2017-05-17 NOTE — Telephone Encounter (Signed)
Will need to call the pt back on Friday 24th for these results.  Will hold in my box until that time.

## 2017-05-19 DIAGNOSIS — R200 Anesthesia of skin: Secondary | ICD-10-CM | POA: Insufficient documentation

## 2017-05-19 NOTE — Telephone Encounter (Signed)
Pt is aware of results and voiced her understanding. Nothing further needed.  

## 2017-09-28 ENCOUNTER — Emergency Department (HOSPITAL_COMMUNITY): Payer: Medicare Other

## 2017-09-28 ENCOUNTER — Encounter (HOSPITAL_COMMUNITY): Payer: Self-pay | Admitting: *Deleted

## 2017-09-28 DIAGNOSIS — D649 Anemia, unspecified: Secondary | ICD-10-CM | POA: Diagnosis not present

## 2017-09-28 DIAGNOSIS — Y9389 Activity, other specified: Secondary | ICD-10-CM | POA: Diagnosis not present

## 2017-09-28 DIAGNOSIS — S0990XA Unspecified injury of head, initial encounter: Secondary | ICD-10-CM | POA: Diagnosis present

## 2017-09-28 DIAGNOSIS — Z79899 Other long term (current) drug therapy: Secondary | ICD-10-CM | POA: Diagnosis not present

## 2017-09-28 DIAGNOSIS — S060X0A Concussion without loss of consciousness, initial encounter: Secondary | ICD-10-CM | POA: Insufficient documentation

## 2017-09-28 DIAGNOSIS — Y998 Other external cause status: Secondary | ICD-10-CM | POA: Diagnosis not present

## 2017-09-28 DIAGNOSIS — I10 Essential (primary) hypertension: Secondary | ICD-10-CM | POA: Insufficient documentation

## 2017-09-28 DIAGNOSIS — Y9289 Other specified places as the place of occurrence of the external cause: Secondary | ICD-10-CM | POA: Insufficient documentation

## 2017-09-28 DIAGNOSIS — W01198A Fall on same level from slipping, tripping and stumbling with subsequent striking against other object, initial encounter: Secondary | ICD-10-CM | POA: Diagnosis not present

## 2017-09-28 DIAGNOSIS — R51 Headache: Secondary | ICD-10-CM | POA: Insufficient documentation

## 2017-09-28 LAB — I-STAT TROPONIN, ED: Troponin i, poc: 0 ng/mL (ref 0.00–0.08)

## 2017-09-28 LAB — CBC
HCT: 38.4 % (ref 36.0–46.0)
Hemoglobin: 11.9 g/dL — ABNORMAL LOW (ref 12.0–15.0)
MCH: 28.8 pg (ref 26.0–34.0)
MCHC: 31 g/dL (ref 30.0–36.0)
MCV: 93 fL (ref 78.0–100.0)
Platelets: 219 10*3/uL (ref 150–400)
RBC: 4.13 MIL/uL (ref 3.87–5.11)
RDW: 14.3 % (ref 11.5–15.5)
WBC: 9.1 10*3/uL (ref 4.0–10.5)

## 2017-09-28 LAB — I-STAT CHEM 8, ED
BUN: 18 mg/dL (ref 6–20)
Calcium, Ion: 1.2 mmol/L (ref 1.15–1.40)
Chloride: 100 mmol/L — ABNORMAL LOW (ref 101–111)
Creatinine, Ser: 1.1 mg/dL — ABNORMAL HIGH (ref 0.44–1.00)
Glucose, Bld: 114 mg/dL — ABNORMAL HIGH (ref 65–99)
HCT: 37 % (ref 36.0–46.0)
Hemoglobin: 12.6 g/dL (ref 12.0–15.0)
Potassium: 3.6 mmol/L (ref 3.5–5.1)
Sodium: 140 mmol/L (ref 135–145)
TCO2: 30 mmol/L (ref 22–32)

## 2017-09-28 NOTE — ED Triage Notes (Signed)
Pt says that this morning she was walking, went to turn and fell down, striking her head. No LOC. She went to urgent care prior, they wanted her eval for abnormal EKG. CBC and BMET completed at the office, pt has results with her.

## 2017-09-29 ENCOUNTER — Emergency Department (HOSPITAL_COMMUNITY)
Admission: EM | Admit: 2017-09-29 | Discharge: 2017-09-29 | Disposition: A | Discharge status: Home / Self Care | Payer: Medicare Other | Attending: Emergency Medicine | Admitting: Emergency Medicine

## 2017-09-29 ENCOUNTER — Emergency Department (HOSPITAL_COMMUNITY): Payer: Medicare Other

## 2017-09-29 DIAGNOSIS — S060X0A Concussion without loss of consciousness, initial encounter: Secondary | ICD-10-CM

## 2017-09-29 DIAGNOSIS — W19XXXA Unspecified fall, initial encounter: Secondary | ICD-10-CM

## 2017-09-29 NOTE — ED Provider Notes (Signed)
[facility] EMERGENCY DEPARTMENT Provider Note   CSN: [id number] Arrival date & time: 09/28/17  2001     History   Chief Complaint Chief Complaint  Patient presents with  . Fall    HPI [name] is a 77 y.o. female.  The history is provided by the patient.  Fall  This is a new problem. The current episode started 12 to 24 hours ago. The problem has been gradually improving. Associated symptoms include headaches. Pertinent negatives include no chest pain, no abdominal pain and no shortness of breath. The symptoms are aggravated by walking. The symptoms are relieved by rest.  Presents after a fall at a restaurant yesterday morning This occurred on the morning of January 3 Reports she stood up and lost her balance and fell hitting her head. No LOC reported She is unsure if she lost her balance or potentially the floor was uneven Denies any dizziness or focal weakness No new chest pain or shortness of breath She does have a headache since the fall She went to a local urgent care in [place] called [facility] They did labs and EKG. They told her she did go the ER because she may be having a heart attack and she needs a brain MRI  Patient reports recent increased stress due to significant other requiring cancer therapy, she reports she had to euthanize her dog, she reports she is not sleeping.     Past Medical History:  Diagnosis Date  . Anemia   . Anxiety   . Arthritis   . Back pain    reason unknown  . Cataract    bilateral immature  . Diarrhea   . Diverticulosis   . GERD (gastroesophageal reflux disease)    takes Omeprazole daily  . Headache(784.0)    occasionally  . Hiatal hernia   . History of blood transfusion    no abnormal reaction noted  . History of bronchitis 2014  . History of colon polyps   . History of shingles   . Hypertension    takes Metoprolol,Amlodipine and Losartan daily  . Internal hemorrhoids   . Joint pain   . Joint  swelling   . Lung nodule   . Nocturia   . Numbness    hands occasionally  . Osteoarthritis   . Peripheral edema    takes HCTZ daily and Furosemide daily as needed  . PONV (postoperative nausea and vomiting)   . Redness    to both lower extremities  . Shortness of breath    WITH EXERTION   . Sleep apnea    doesn't use cpap  . Urinary frequency   . Urinary urgency   . Venous insufficiency     Patient Active Problem List   Diagnosis Date Noted  . Varicose veins of right lower extremity with complications 04/03/2017  . Edema 10/31/2016  . Prophylactic antibiotic for dental procedure indicated due to prior joint replacement 09/29/2016  . Essential hypertension 09/29/2016  . GERD (gastroesophageal reflux disease) 09/29/2016  . Ventral hernia 09/29/2016  . Dyspnea 05/10/2016  . Obesity 05/10/2016  . Abdominal wall hernia 05/10/2016  . Chronic venous insufficiency 05/10/2016  . Venous stasis dermatitis of both lower extremities 05/10/2016  . Abnormal CT of the chest 12/22/2015  . Multiple pulmonary nodules 12/22/2015  . 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 ABDOMINAL HYSTERECTOMY  1983  . ANKLE SURGERY Left 1993  . ANTERIOR CRUCIATE  LIGAMENT REPAIR Left 2013  . BREAST SURGERY     LEFT SIDE BIOPSY 1989  . CHOLECYSTECTOMY  1990  . COLONOSCOPY    . ENDOVENOUS ABLATION SAPHENOUS VEIN W/ LASER Right 04/17/2017   endovenous laser ablation R [facility] by Josephina Gip MD  . ESOPHAGOGASTRODUODENOSCOPY    . HAND SURGERY Left 2011  . QUADRICEPS TENDON REPAIR  07/16/2012   Procedure: REPAIR QUADRICEP TENDON;  Surgeon: Raymon Mutton, MD;  Location: [facility] OR;  Service: Orthopedics;  Laterality: Left;  open repair of VMO   . quiadricp  10'21/2012  . 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 TOTAL KNEE  ARTHROPLASTY Right 11/04/2013   DR Sherlean Foot  . 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 TUBAL LIGATION  1974  . vasculitic eruption of legs  1985    OB History    No data available       Home Medications    Prior to Admission medications   Medication Sig Start Date End Date Taking? Authorizing Provider  acetaminophen (TYLENOL) 500 MG tablet Take 500 mg by mouth every 6 (six) hours as needed. For pain    [provider]  aspirin 81 MG tablet Take 81 mg by mouth daily.    [provider]  Biotin 5000 MCG CAPS Take 1 capsule by mouth daily.    [provider]  Calcium-Magnesium-Vitamin D (CALCIUM 1200+D3 PO) Take 1 tablet by mouth daily.    [provider]  Cholecalciferol (VITAMIN D3) 10000 units capsule Take 10,000 Units by mouth daily.    [provider]  furosemide (LASIX) 40 MG tablet Take 40 mg by mouth as needed. Water gain     [provider]  Glucosamine-Chondroitin (GLUCOSAMINE CHONDR COMPLEX PO) Take 2 capsules by mouth daily.    [provider]  hydrochlorothiazide (HYDRODIURIL) 25 MG tablet Take 25 mg by mouth daily.    [provider]  losartan (COZAAR) 100 MG tablet Take 100 mg by mouth daily.    [provider]  MEGARED OMEGA-3 KRILL OIL 500 MG CAPS Take 1 tablet by mouth daily.    [provider]  metoprolol tartrate (LOPRESSOR) 25 MG tablet Take 25 mg by mouth 2 (two) times daily.     [provider]  omeprazole (PRILOSEC) 20 MG capsule Take 20 mg by mouth daily.    [provider]  potassium gluconate 595 MG TABS tablet Take 550 mg by mouth daily.     [provider]  triamcinolone cream (KENALOG) 0.1 % Apply 1 application topically 3 (three) times daily.    [provider]  vitamin B-12 (CYANOCOBALAMIN) 1000 MCG  tablet Take 1,000 mcg by mouth daily.    [provider]    Family History Family History  Problem Relation Age of Onset  . Lymphoma Father   . Leukemia Father   . Heart attack Father   . Hypertension Mother   . Emphysema Mother   . Heart disease Sister   . Rashes / Skin problems Sister   . Heart failure Sister   . Kidney disease Sister   . Heart disease Maternal Grandfather   . Heart disease Paternal Grandmother   . Heart disease Paternal Grandfather  Social History Social History   Tobacco Use  . Smoking status: Never Smoker  . Smokeless tobacco: Never Used  Substance Use Topics  . Alcohol use: No    Alcohol/week: 0.0 oz  . Drug use: No     Allergies   Adhesive [tape]   Review of Systems Review of Systems  Constitutional: Negative for fever.  Respiratory: Negative for shortness of breath.   Cardiovascular: Negative for chest pain.  Gastrointestinal: Negative for abdominal pain.  Neurological: Positive for headaches. Negative for dizziness and weakness.  All other systems reviewed and are negative.    Physical Exam Updated Vital Signs BP (!) 134/49   Pulse 72   Temp 98.7 F (37.1 C) (Oral)   Resp 20   Ht 1.727 m (5\' 8" )   Wt 132.9 kg (293 lb)   LMP 02/07/2012   SpO2 97%   BMI 44.55 kg/m   Physical Exam CONSTITUTIONAL: Well developed/well nourished HEAD: Normocephalic/atraumatic mild tenderness to posterior scalp, no crepitus or step-offs EYES: EOMI/PERRL ENMT: Mucous membranes moist NECK: supple no meningeal signs SPINE/BACK:entire spine nontender, No bruising/crepitance/stepoffs noted to spine CV: S1/S2 noted, no murmurs/rubs/gallops noted LUNGS: Lungs are clear to auscultation bilaterally, no apparent distress ABDOMEN: soft, nontender, no rebound or guarding, bowel sounds noted throughout abdomen, easily reducible hernia noted, patient is obese GU:no cva tenderness NEURO: Pt is awake/alert/appropriate, moves all extremitiesx4.   No facial droop.  No arm or leg drift noted EXTREMITIES: pulses normal/equal, full ROM SKIN: warm, color normal PSYCH: no abnormalities of mood noted, alert and oriented to situation   ED Treatments / Results  Labs (all labs ordered are listed, but only abnormal results are displayed) Labs Reviewed  CBC - Abnormal; Notable for the following components:      Result Value   Hemoglobin 11.9 (*)    All other components within normal limits  I-STAT CHEM 8, ED - Abnormal; Notable for the following components:   Chloride 100 (*)    Creatinine, Ser 1.10 (*)    Glucose, Bld 114 (*)    All other components within normal limits  I-STAT TROPONIN, ED    EKG  EKG Interpretation  Date/Time:  Thursday September 28 2017 20:14:28 EST Ventricular Rate:  72 PR Interval:  166 QRS Duration: 78 QT Interval:  400 QTC Calculation: 438 R Axis:   66 Text Interpretation:  Normal sinus rhythm Low voltage QRS Septal infarct , age undetermined Abnormal ECG No significant change since last tracing in 2015 Confirmed by Marily Memos [id number]) on 09/28/2017 8:19:27 PM       Radiology Dg Chest 2 View  Result Date: 09/28/2017 CLINICAL DATA:  Abnormal EKG. Patient fell hitting back of head. History of hypertension, lung nodule left breast biopsy. EXAM: CHEST  2 VIEW COMPARISON:  05/04/2017 FINDINGS: Heart is top normal. Minimal aortic atherosclerosis at the arch. No pneumonic consolidation, effusion or pneumothorax. Previously noted tiny 2-3 mm nodular density in the right upper lobe is unchanged to slightly smaller in appearance. No acute osseous abnormality. IMPRESSION: No active cardiopulmonary disease. Stable right upper lobe tiny nodular opacity. Electronically Signed   By: Tollie Eth M.D.   On: 09/28/2017 20:42   Ct Head Wo Contrast  Result Date: 09/29/2017 CLINICAL DATA:  Patient fell down, striking her head. No loss of consciousness. EXAM: CT HEAD WITHOUT CONTRAST TECHNIQUE: Contiguous axial images were  obtained from the base of the skull through the vertex without intravenous contrast. COMPARISON:  None. FINDINGS: Brain: Diffuse cerebral atrophy. Mild  ventricular dilatation consistent with central atrophy. Patchy low-attenuation changes in the deep white matter consistent with small vessel ischemia. No mass effect or midline shift. No abnormal extra-axial fluid collections. Gray-white matter junctions are distinct. Basal cisterns are not effaced. No acute intracranial hemorrhage. Vascular: Vascular calcifications are present. Skull: Calvarium appears intact.  No depressed skull fractures. Sinuses/Orbits: No acute finding. Other: None. IMPRESSION: No acute intracranial abnormalities. Chronic atrophy and small vessel ischemic changes. Electronically Signed   By: Burman Nieves M.D.   On: 09/29/2017 03:27    Procedures Procedures   Medications Ordered in ED Medications - No data to display   Initial Impression / Assessment and Plan / ED Course  I have reviewed the triage vital signs and the nursing notes.  Pertinent labs & imaging results that were available during my care of the patient were reviewed by me and considered in my medical decision making (see chart for details).     2:59 AM Sent from urgent care for extensive workup.  They told her she was having a heart attack, I reviewed EKG from urgent care and our EKG and there is no acute ST changes.  Her troponin is negative.  They also told her to get a brain MRI.  At this point I feel CT head is warranted due to hitting her head,I don't feel emergent by MRI is required as no signs of acute stroke.  The fall sounded mechanical  She reports she has had recent increased stress which may be contributing to this, she does report she fell last Thanksgiving.  But is otherwise been stable  At time of discharge: All imaging and labs are unremarkable I do not see any evidence of MI No signs of acute head injury, I do not feel emergent MRI brain  is warranted She did not report any episodes of syncope. She denied any dizziness upon standing, her vitals remained stable She is appropriate for discharge to follow-up as an outpatient Final Clinical Impressions(s) / ED Diagnoses   Final diagnoses:  Fall, initial encounter  Concussion without loss of consciousness, initial encounter    ED Discharge Orders    None       Zadie Rhine, MD 09/29/17 [phone]

## 2017-09-29 NOTE — ED Notes (Signed)
Pt back from CT

## 2017-09-29 NOTE — ED Notes (Signed)
Pt transported to CT ?

## 2017-11-09 ENCOUNTER — Ambulatory Visit: Payer: Medicare Other | Admitting: Pulmonary Disease

## 2017-11-16 ENCOUNTER — Ambulatory Visit (INDEPENDENT_AMBULATORY_CARE_PROVIDER_SITE_OTHER): Payer: Medicare Other | Admitting: Pulmonary Disease

## 2017-11-16 ENCOUNTER — Encounter: Payer: Self-pay | Admitting: Pulmonary Disease

## 2017-11-16 VITALS — BP 134/72 | HR 61 | Temp 97.7°F | Ht 68.0 in | Wt 297.0 lb

## 2017-11-16 DIAGNOSIS — I10 Essential (primary) hypertension: Secondary | ICD-10-CM

## 2017-11-16 DIAGNOSIS — Z6841 Body Mass Index (BMI) 40.0 and over, adult: Secondary | ICD-10-CM

## 2017-11-16 DIAGNOSIS — R609 Edema, unspecified: Secondary | ICD-10-CM

## 2017-11-16 DIAGNOSIS — I872 Venous insufficiency (chronic) (peripheral): Secondary | ICD-10-CM

## 2017-11-16 DIAGNOSIS — R9389 Abnormal findings on diagnostic imaging of other specified body structures: Secondary | ICD-10-CM | POA: Diagnosis not present

## 2017-11-16 DIAGNOSIS — I83891 Varicose veins of right lower extremities with other complications: Secondary | ICD-10-CM

## 2017-11-16 DIAGNOSIS — R0600 Dyspnea, unspecified: Secondary | ICD-10-CM | POA: Diagnosis not present

## 2017-11-16 DIAGNOSIS — R918 Other nonspecific abnormal finding of lung field: Secondary | ICD-10-CM | POA: Diagnosis not present

## 2017-11-16 NOTE — Patient Instructions (Signed)
Today we updated your med list in our EPIC system...    Continue your current medications the same...  Please BE CAREFUL - no falling allowed!!!  Loraine LericheMark you calendar to call me around August 1st so we can set [facility] CT Chest [facility] and follow up office visit the same day to save you some travel time...  You are doing a great job & our best wishes to you & Kathlene NovemberMike on his battle with lung cancer- you are in our thoughts and prayers.[name]..[name]

## 2017-11-16 NOTE — Progress Notes (Signed)
Subjective:     Patient ID: [name], female   DOB: [birth year], 77 y.o.   MRN: [id number]  HPI  ~  December 22, 2015:  Initial pulmonary consult by SN>      77 y/o WF referred by [name]. Delorise Galloway from [facility] for a pulmonary evaluation>  Pt describes an episode of bronchitis in Feb2017 characterized by cough, beige sput/ no hemoptysis, and incr SOB w/ wheezing;  She was treated w/ Doxy & Pred w/ improvement;  She knows that her dyspnea is likely from her weight & physical deconditioning/ lack of exercise;  CXR obtained & showed mult pulm nodules;  Subseq CT Chest 11/17/15 at [facility] in [place] showed norm heart size, several bilateral subcentimeter nodules more numerous on the right w/ the largest measuring 5mm in the RUL, no suspicious spiculated or ground glass nodules, no adenopathy, mild thoracic spondylosis, no other lesions... She has no known hx cancer, had benign breast biopsy in 1989...  Smoking Hx>  She is a never smoker, but had second hand exposure from parents, ex-husb (who died from lung ca), & boyfriend who recently quit...  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  Old CXR in Epic/PACS system 10/22/13>  Norm heart size, mild pleuro-parenchymal scarring at lung bases bilat, NAD & no change from comparison film in 2013, DJD in Tspine...   EKG 10/22/13>  NSR, rate77, poor R progression V1-3, no acute STTWA...  CXR (I cannot yet access the disc to view the CXR file from [place])  CT Chest 11/17/15 at [facility] in [place] showed norm heart size, several bilateral subcentimeter nodules more numerous on the right w/ the largest measuring 5mm in the RUL, no suspicious spiculated or ground glass nodules, no adenopathy, mild thoracic spondylosis, no other lesions IMP >>     Abn CXR/ CT Chest Feb2017 in [facility] showing bilat subcentimeter pulm nodules, no known underlying cancer, she is a never smoker; plan is to repeat CT Chest after 29mo interval=> sched for 04/2016...    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    Morbid Obesity w/ OSA & prob restrictive lung dis=> we reviewed the importance of DIET/ EXERCISE/ Wt reduction strategies...    Cardiovasc issues>  HBP, Venous insuffic/ stasis changes/ edema in LEs    Medical  issues> HH/GERD, colon polyps, DJD w/ bilat TKRs PLAN >>     We discussed her episode of bronchitis 2/17 (now resolved);  We reviewed her DOE likely due to her obesity/ lack of physical exercise/ deconditioning and she needs to get motivated to lose the weight & increase her exercise program;  We held-off on doing PFTs at this point while she gets on track w/ diet & exercise;  She is a never smoker w/ several bilat <39mm pulm nodules and the  plan is to follow radiologies rec for repeat CT Chest in 33mo=> due Aug2017... She will call for any breathing issues in the interim.  ~  May 10, 2016:  77mo ROV w/ SN>  [name] informs me that [name] passed away in [place] 77mo ago; she is followed by the [facility] in [place]; she is here for a follow up visit regarding the following problems>    Abn CXR/ CT Chest Feb2017 in [facility] showing bilat subcentimeter pulm nodules, largest=77mm in RUL, no known underlying cancer, she is a never smoker; plan is to repeat CT Chest after 71mo interval=>     Dyspnea is surely multifactorial w/ her weight (300+) & lack of exercise/ physical deconditioning playing a major roll    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    Morbid Obesity w/ OSA & prob restrictive lung dis=> we reviewed the importance of DIET/ EXERCISE/ Wt reduction strategies...    Cardiovasc issues>  HBP, Venous insuffic/ stasis changes/ edema in LEs    Medical issues> HH/GERD, colon polyps, abd wall hernia, DJD w/ bilat TKRs, calcif left breast lesion Exam shows Afeb, VSS, O2sat=99% on RA at rest; Wt=301, 5'8"Tall, BMI=46;  Heent- neg, mallampati2;  Chest- clear w/o w/r/r;  Heart- RR w/o m/r/g;  Abd- obese, panniculus, nontender;  Ext- VI & stasis changes;  Neuro- intact.   LABS from [facility] 03/2016>  Chol 177 & FLP parameters at at goals;  Chems- wnl w/ BS=100, Cr=0.95;  CBC- sl anemia w/ Hg=11.8;  TSH=1.71 IMP/PLAN>>  [name] is stable- same amt of dyspnea but hasn't lost any wt & not exercising, we reviewed the importance of diet & exercise for wt reduction;  We decided to wait til her next OV for f/u CT Chest;  We will refer her to GI- [name] for abd wall (incisional hernia) & colonoscopy due. Note: >50% of this 25 min  visit was spent in counseling & coordination of care...  ~  October 31, 2016:  71mo ROV & pulmonary follow up visit>  [name] has established w/ [facility]- Tabitha GallowaySBurns [facility]>  She returns today & had her f/u CT Chest this AM=> no change in the 4mm RML nodule or the scattered 2-1mm nodules in upper lobes;  She has VV/ VI which is improved on Rx from wound clinic Rx; she saw a vascular spec in [place] who rec vein ablation but she is reluctant & we discussed 2nd opinion from [facility]VS here in [place]... See prob list above... Exam shows Afeb, VSS, O2sat=99% on RA at rest; Wt=301, 5'8"Tall, BMI=46;  Heent- neg, mallampati2;  Chest- clear w/o w/r/r;  Heart- RR w/o m/r/g;  Abd- obese, panniculus, nontender;  Ext- VI & stasis changes;  Neuro- intact.   CT Chest 10/31/16>  Norm  heart size, atherosclerotic calcif in Ao, dominant 4mm nodule in RML- unchanged, additional scat nodules betw 2-38mm in both upper lobes- unchanged, mild atx in RML/ RLL, mild hepatic steatosis & vasc calcif in abd, chr rim-calcif lesion in left breast- prob fat necrosis & chronic, degen changes in Tspine...  IMP/PLAN>>  CT is stable & she is reassured & we will continue to follow;  We again reviewed the need for diet, exercise, wt reduction; plan rov 43mo...   ~  May 04, 2017:  43mo ROV & pulmonary follow up visit> [name] returns for pulm f/u visit- CT Chest 10/2016 showed no change in the 4mm index nodule in RML or the scattered 2-5mm nodules elsewhere;  In the meanwhile she saw [facility] for [facility] regarding her VV/ VI and had laser ablation of the right GSV for chr swelling & skin changes- she indicates feeling better at this point, less tightness & smoother skin; she is considering a similar procedure on the left leg but holding off for now... Her PCP is [facility] in [place] seen 10/2016, BP controlled, meds reviewed, noted that she is on VitD 10K daily from her "wound care physician"...    From the pulmonary standpoint she  notes that her breathing is OK but she notes DOE quickly w/ ambulation, no cough/ sput/ hemoptysis, denies CP/ palpit/ etc;  We discussed the need for diet/ exercise/ & wt reduction to help her DOE complaints...     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    Morbid Obesity w/ OSA & prob restrictive lung dis=> we reviewed the importance of DIET/ EXERCISE/ Wt reduction strategies...    Cardiovasc issues>  HBP, Venous insuffic/ stasis changes/ edema in LEs    Medical issues> HH/GERD, colon polyps, abd wall hernia, DJD w/ bilat TKRs, calcif left breast lesion  Exam shows Afeb, VSS, O2sat=98% on RA at rest; Wt=296#, 5'8"Tall, BMI=45;  Heent- neg, mallampati2;  Chest- clear w/o w/r/r;  Heart- RR w/o m/r/g;  Abd- obese, panniculus, nontender;  Ext- VI & stasis changes;  Neuro- intact.   CXR 05/04/17>  Heart is at the upper lim of norm, clear lungs w/ one ~38mm nodule noted in the right mid lung (unchanged), degen changes in Tspine... IMP/PLAN>>  [name] is stable overall, she understands that her dyspnea is multifactorial & that poor conditioning/ lack of exercise are partially to blame; substantial improvement in her shortness of breath will require a commitment to DIET/ WEIGHT REDUCTION/ & EXERCISE; we will continue to follow CXRs and consider additional CT if needed...   ~  November 16, 2017:  43mo ROV & [name] reports that she has fallen x2 (Dec in kitchen & Jan at [facility]) w/  mild concussion she says, husb has stage 4 lung cancer- s/p chemo & XRT (now on Keytruda); she reports breathing well- denies cough, sput, hemoptysis, or SOB; she has chr stable DOE but ok w/ ADLs> she is not on any  pulm meds, has been counseled on DIET/ EXERCISE/ WEIGHT REDUCTION but has been unable to lose the weight...     We reviewed problem list above- she wants to wait 63mo for f/u CT Chest & OV on the same day (she lives in [place], [place]Marland KitchenMarland Kitchen     Exam shows Afeb, VSS, O2sat=98% on RA at rest; Wt=297#, 5'8"Tall, BMI=45;  Heent- neg, mallampati2;  Chest- clear w/o w/r/r;  Heart- RR w/o m/r/g;  Abd- obese, panniculus, nontender;  Ext- VI & stasis changes;  Neuro- intact.  IMP/PLAN>>  Dionna has hx abn CXR/ CT Chest w/ bilat subcentimeter pulm nodules (no change 2/17 - 2/18 scans), she is a never smoker & no hx underlying Ca etc;  Hx multifactorial dyspnea- weight & deconditioning are the major factors but she has been unable to lose weight & hasn't been exercising despite recommendations; [place]SA [place] but was intol to CPAP & refused rx; Obesity & prob restrictive lung dis- again stressed the importance of weight reduction & exercise;  We plan ROV w/ CT Chest in 63mo...    Past Medical History:  Diagnosis Date  . Anemia   . Anxiety   . Arthritis   . Back pain    reason unknown  . Cataract    bilateral immature  . Diarrhea   . Diverticulosis   . GERD (gastroesophageal reflux disease)    takes Omeprazole daily  . Headache(784.0)    occasionally  . Hiatal hernia   . History of blood transfusion    no abnormal reaction noted  . History of bronchitis 2014  . History of colon polyps   . History of shingles   . Hypertension    takes Metoprolol,Amlodipine and Losartan daily  . Internal hemorrhoids   . Joint pain   . Joint swelling   . Lung nodule   . Nocturia   . Numbness    hands occasionally  . Osteoarthritis   . Peripheral edema    takes HCTZ daily and Furosemide  daily as needed  . PONV (postoperative nausea and vomiting)   . Redness    to both lower extremities  . Shortness of breath    WITH EXERTION   . Sleep apnea    doesn't use cpap  . Urinary frequency   . Urinary urgency   . Venous insufficiency     Past Surgical History:  Procedure Laterality Date  . ABDOMINAL HYSTERECTOMY  1983  . ANKLE SURGERY Left 1993  . ANTERIOR CRUCIATE LIGAMENT REPAIR Left 2013  . BREAST SURGERY     LEFT SIDE BIOPSY 1989  . CHOLECYSTECTOMY  1990  . COLONOSCOPY    . ENDOVENOUS ABLATION SAPHENOUS VEIN W/ LASER Right 04/17/2017   endovenous laser ablation R GSV by Josephina Gip MD  . ESOPHAGOGASTRODUODENOSCOPY    . HAND SURGERY Left 2011  . QUADRICEPS TENDON REPAIR  07/16/2012   Procedure: REPAIR QUADRICEP TENDON;  Surgeon: Raymon Mutton, MD;  Location[facility] OR;  Service: Orthopedics;  Laterality: Left;  open repair of VMO   . quiadricp  10'21/2012  . 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 TOTAL KNEE ARTHROPLASTY Right 11/04/2013   [name] Sherlean Foot  . 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  TUBAL LIGATION  1974  . vasculitic eruption of legs  1985    Outpatient Encounter Medications as of 11/16/2017  Medication Sig  . acetaminophen (TYLENOL) 500 MG tablet Take 500 mg by mouth every 6 (six) hours as needed. For pain  . aspirin 81 MG tablet Take 81 mg by mouth daily.  . Biotin 5000 MCG CAPS Take 1 capsule by mouth daily.  . Calcium-Magnesium-Vitamin D (CALCIUM 1200+D3 PO) Take 1 tablet by mouth daily.  . Cholecalciferol (VITAMIN D3) 10000 units capsule Take 10,000 Units by mouth daily.  . furosemide (LASIX) 40 MG tablet Take 40 mg by mouth as needed. Water gain   . Glucosamine-Chondroitin (GLUCOSAMINE  CHONDR COMPLEX PO) Take 2 capsules by mouth daily.  . hydrochlorothiazide (HYDRODIURIL) 25 MG tablet Take 25 mg by mouth daily.  Marland Kitchen losartan (COZAAR) 100 MG tablet Take 100 mg by mouth daily.  Marland Kitchen MEGARED OMEGA-3 KRILL OIL 500 MG CAPS Take 1 tablet by mouth daily.  . metoprolol tartrate (LOPRESSOR) 25 MG tablet Take 25 mg by mouth 2 (two) times daily.   Marland Kitchen omeprazole (PRILOSEC) 20 MG capsule Take 20 mg by mouth daily.  . potassium gluconate 595 MG TABS tablet Take 550 mg by mouth daily.   Marland Kitchen triamcinolone cream (KENALOG) 0.1 % Apply 1 application topically 3 (three) times daily.  Marland Kitchen triamcinolone lotion (KENALOG) 0.1 % Apply 1 application topically 3 (three) times daily.  . vitamin B-12 (CYANOCOBALAMIN) 1000 MCG tablet Take 1,000 mcg by mouth daily.   No facility-administered encounter medications on file as of 11/16/2017.     Allergies  Allergen Reactions  . Adhesive [Tape] Other (See Comments) and Rash    Skin is very sensitive Skin is very sensitive Skin is very sensitive    Immunization History  Administered Date(s) Administered  . Influenza, High Dose Seasonal PF 07/02/2014, 06/22/2015, 07/05/2016  . Influenza,inj,Quad PF,6+ Mos 06/27/2015  . Pneumococcal Conjugate-13 11/13/2014  . Pneumococcal-Unspecified 09/26/2005  . Tdap 08/04/2011  She is up-to-date on indicated vaccinations per PCP note- 11/30/15   Current Medications, Allergies, Past Medical History, Past Surgical History, Family History, and Social History were reviewed in [employer] record.   Review of Systems             All symptoms NEG except where BOLDED >>  Constitutional:  F/C/S, fatigue, anorexia, unexpected weight change. HEENT:  HA, visual changes, hearing loss, earache, nasal symptoms, sore throat, mouth sores, hoarseness. Resp:  cough, sputum, hemoptysis; SOB, tightness, wheezing. Cardio:  CP, palpit, DOE, orthopnea, edema. GI:  N/V/D/C, blood in stool; reflux, abd pain, distention,  gas. GU:  dysuria, freq, urgency, hematuria, flank pain, voiding difficulty. MS:  joint pain, swelling, tenderness, decr ROM; neck pain, back pain, etc. Neuro:  HA, tremors, seizures, dizziness, syncope, weakness, numbness, gait abn. Skin:  suspicious lesions or skin rash. Heme:  adenopathy, bruising, bleeding. Psyche:  confusion, agitation, sleep disturbance, hallucinations, anxiety, depression suicidal.   Objective:   Physical Exam       Vital Signs:  Reviewed...   General:  WD, obese, 77 y/o WF in NAD; alert & oriented; pleasant & cooperative... HEENT:  [place]/AT; Conjunctiva- pink, Sclera- nonicteric, EOM-wnl, PERRLA, EACs-clear, TMs-wnl; NOSE-clear; THROAT-clear & wnl.  Neck:  Supple w/ fair ROM; no JVD; normal carotid impulses w/o bruits; no thyromegaly or nodules palpated; no lymphadenopathy.  Chest:  Clear to P & A; without wheezes, rales, or rhonchi heard. Heart:  Regular Rhythm; norm S1 & S2 without murmurs, rubs, or  gallops detected. Abdomen:  Obese, soft & nontender- no guarding or rebound; +panniculus, abd wall hernia, normal bowel sounds; no organomegaly or masses palpated. Ext:  decr ROM; s/p bilat TKRs, +arthritic changes; no varicose veins, +venous insuffic/ stasis changes/ edema;  Pulses intact w/o bruits. Neuro:  No focal neuro deficits; gait OK & balance OK. Derm:  No lesions noted; no rash etc. Lymph:  No cervical, supraclavicular, axillary, or inguinal adenopathy palpated.   Assessment:      IMP >>     Abn CXR/ [place] [facility] Feb[place] [facility] showing bilat subcentimeter pulm nodules, no known underlying cancer, she is a never smoker; plan is to repeat CT Chest after 38mo interval=> sched for 04/2016...    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    Morbid Obesity w/ OSA & prob restrictive lung dis=> we reviewed the importance of DIET/ EXERCISE/ Wt reduction strategies...    Cardiovasc issues>  HBP, Venous insuffic/ stasis changes/ edema in LEs    Medical issues> HH/GERD, colon polyps, abd wall hernia, DJD w/ bilat TKRs  PLAN >>  12/22/15>   We discussed her episode of bronchitis 2/17 (now resolved);  We reviewed her DOE likely due to her obesity/ lack of physical exercise/ deconditioning and she needs to get motivated to lose the weight & increase her exercise program;  We held-off on doing PFTs at this point while she gets on track w/ diet & exercise;  She is a never smoker w/ several bilat <715mm pulm nodules and the plan is to follow radiologies rec for repeat CT Chest in 1538mo=> due Aug2017... She will call for any breathing issues in the interim. 05/10/16>   Tarae is stable- same amt of dyspnea but hasn't lost any wt & not exercising, we reviewed the importance of diet & exercise for wt reduction;  We decided to wait til her next OV for f/u CT Chest;  We will refer her to GI- [name] for abd wall (incisional hernia) & colonoscopy due 10/31/16>   CT is stable & she is reassured & we will continue to follow;  We again reviewed the need for diet, exercise, wt reduction; plan rov 38mo...  05/04/17>   [name] is stable overall, she understands that her dyspnea is multifactorial & that poor conditioning/ lack of exercise are partially to blame; substantial improvement in her shortness of breath will require a commitment to DIET/ WEIGHT REDUCTION/ & EXERCISE; we will continue to follow CXRs and consider additional CT if needed 11/16/17>   [name] has hx abn CXR/ CT Chest w/ bilat subcentimeter pulm nodules (no change 2/17 - 2/18 scans), she is a never smoker & no hx underlying Ca etc;  Hx multifactorial dyspnea- weight & deconditioning are the major factors but she has been  unable to lose weight & hasn't been exercising despite recommendations; hx OSA on sleep study in [place] but was intol to CPAP & refused rx; Obesity & prob restrictive lung dis- again stressed the importance of weight reduction & exercise;  We plan ROV w/ CT Chest in 38mo   Plan:     Patient's Medications  New Prescriptions   No medications on file  Previous Medications   ACETAMINOPHEN (TYLENOL) 500 MG TABLET    Take 500 mg by mouth every 6 (six) hours as needed. For pain   ASPIRIN 81 MG TABLET    Take 81 mg by mouth daily.   BIOTIN 5000 MCG CAPS    Take 1 capsule by mouth daily.   CALCIUM-MAGNESIUM-VITAMIN D (CALCIUM 1200+D3 PO)    Take 1 tablet by mouth daily.   CHOLECALCIFEROL (VITAMIN D3) 10000 UNITS CAPSULE    Take 10,000 Units by mouth daily.   FUROSEMIDE (LASIX) 40 MG TABLET    Take 40 mg by mouth as needed. Water gain    GLUCOSAMINE-CHONDROITIN (GLUCOSAMINE CHONDR COMPLEX PO)    Take 2 capsules by mouth daily.   HYDROCHLOROTHIAZIDE (HYDRODIURIL) 25 MG TABLET    Take 25 mg by mouth daily.   LOSARTAN (COZAAR) 100 MG TABLET    Take 100 mg by mouth daily.   MEGARED OMEGA-3 KRILL OIL 500 MG CAPS    Take 1 tablet by mouth daily.   METOPROLOL TARTRATE (LOPRESSOR) 25 MG TABLET    Take 25 mg by mouth 2 (two) times daily.    OMEPRAZOLE (PRILOSEC) 20 MG CAPSULE    Take 20 mg by mouth daily.   POTASSIUM GLUCONATE 595 MG TABS TABLET    Take 550 mg by mouth daily.    TRIAMCINOLONE CREAM (KENALOG) 0.1 %    Apply 1 application topically 3 (three) times daily.   TRIAMCINOLONE LOTION (KENALOG) 0.1 %    Apply 1 application topically 3 (three) times daily.   VITAMIN B-12 (CYANOCOBALAMIN) 1000 MCG TABLET    Take 1,000 mcg by mouth daily.  Modified Medications   No medications on file  Discontinued Medications   No medications on file

## 2018-05-15 ENCOUNTER — Telehealth: Payer: Self-pay | Admitting: Pulmonary Disease

## 2018-05-15 DIAGNOSIS — R911 Solitary pulmonary nodule: Secondary | ICD-10-CM

## 2018-05-15 NOTE — Telephone Encounter (Signed)
Spoke with patient. She stated that she was advised to get a CT scan on the same day as her follow up appt in August. She has an appt with SN on 05/17/18 for 230pm.   CT scan has not been ordered.   SN, please advise if you want a CT Chest without or with contrast. Thanks!

## 2018-05-15 NOTE — Telephone Encounter (Signed)
Per SN- ok for Ct chest w/out contrast.  Order placed stat for Thursday morning 05/17/18.  Patient notified.  Nothing further needed at this time.

## 2018-05-17 ENCOUNTER — Ambulatory Visit (INDEPENDENT_AMBULATORY_CARE_PROVIDER_SITE_OTHER)
Admission: RE | Admit: 2018-05-17 | Discharge: 2018-05-17 | Disposition: A | Discharge status: Home / Self Care | Payer: Medicare Other | Source: Ambulatory Visit | Attending: Pulmonary Disease | Admitting: Pulmonary Disease

## 2018-05-17 ENCOUNTER — Ambulatory Visit (INDEPENDENT_AMBULATORY_CARE_PROVIDER_SITE_OTHER): Payer: Medicare Other | Admitting: Pulmonary Disease

## 2018-05-17 ENCOUNTER — Encounter: Payer: Self-pay | Admitting: Pulmonary Disease

## 2018-05-17 VITALS — BP 138/64 | HR 99 | Temp 97.7°F | Ht 68.0 in | Wt 273.8 lb

## 2018-05-17 DIAGNOSIS — R0600 Dyspnea, unspecified: Secondary | ICD-10-CM

## 2018-05-17 DIAGNOSIS — R918 Other nonspecific abnormal finding of lung field: Secondary | ICD-10-CM

## 2018-05-17 DIAGNOSIS — I872 Venous insufficiency (chronic) (peripheral): Secondary | ICD-10-CM

## 2018-05-17 DIAGNOSIS — R911 Solitary pulmonary nodule: Secondary | ICD-10-CM | POA: Diagnosis not present

## 2018-05-17 DIAGNOSIS — Z6841 Body Mass Index (BMI) 40.0 and over, adult: Secondary | ICD-10-CM

## 2018-05-17 DIAGNOSIS — R609 Edema, unspecified: Secondary | ICD-10-CM

## 2018-05-17 DIAGNOSIS — I10 Essential (primary) hypertension: Secondary | ICD-10-CM | POA: Diagnosis not present

## 2018-05-17 DIAGNOSIS — R9389 Abnormal findings on diagnostic imaging of other specified body structures: Secondary | ICD-10-CM

## 2018-05-17 DIAGNOSIS — I83891 Varicose veins of right lower extremities with other complications: Secondary | ICD-10-CM

## 2018-05-17 NOTE — Patient Instructions (Addendum)
Today we updated your med list in our EPIC system...    Continue your current medications the same...  Your repeat CT scan today was stable- no sign of progression or any changes in the lung nodules & they are considered benign.  Follow up w/ routine CXRs is recommended...  We discussed your recent trip to [place] & your difficulty in the airports and at the altitude.  Diet, exercise, and weight reduction are the keys to improvement in your breathing & your ability to get around now & in the future- YOU CAN DO IT!  Call for any questions or if we can be of service in any way.Marland Kitchen..Marland Kitchen

## 2018-05-17 NOTE — Progress Notes (Signed)
Subjective:     Patient ID: [name], female   DOB: [birth year], 77 y.o.   MRN: [id number]  HPI  ~  December 22, 2015:  Initial pulmonary consult by SN>      77 y/o WF referred by Dr. Delorise Jackson from [facility] for a pulmonary evaluation>  Pt describes an episode of bronchitis in Feb2017 characterized by cough, beige sput/ no hemoptysis, and incr SOB w/ wheezing;  She was treated w/ Doxy & Pred w/ improvement;  She knows that her dyspnea is likely from her weight & physical deconditioning/ lack of exercise;  CXR obtained & showed mult pulm nodules;  Subseq CT Chest 11/17/15 at [facility] in [place] showed norm heart size, several bilateral subcentimeter nodules more numerous on the right w/ the largest measuring 5mm in the RUL, no suspicious spiculated or ground glass nodules, no adenopathy, mild thoracic spondylosis, no other lesions... She has no known hx cancer, had benign breast biopsy in 1989...  Smoking Hx>  She is a never smoker, but had second hand exposure from parents, ex-husb (who died from lung ca), & boyfriend who recently quit...  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  Old CXR in Epic/PACS system 10/22/13>  Norm heart size, mild pleuro-parenchymal scarring at lung bases bilat, NAD & no change from comparison film in 2013, DJD in Tspine...   EKG 10/22/13>  NSR, rate77, poor R progression V1-3, no acute STTWA...  CXR (I cannot yet access the disc to view the CXR file from [place])  CT Chest 11/17/15 at [facility] in [place] showed norm heart size, several bilateral subcentimeter nodules more numerous on the right w/ the largest measuring 5mm in the RUL, no suspicious spiculated or ground glass nodules, no adenopathy, mild thoracic spondylosis, no other lesions IMP >>     Abn CXR/ CT Chest Feb2017 in [facility] showing bilat subcentimeter pulm nodules, no known underlying cancer, she is a never smoker; plan is to repeat CT Chest after 29mo interval=> sched for 04/2016...    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    Morbid Obesity w/ OSA & prob restrictive lung dis=> we reviewed the importance of DIET/ EXERCISE/ Wt reduction strategies...    Cardiovasc issues>  HBP, Venous insuffic/ stasis changes/ edema in LEs    Medical  issues> HH/GERD, colon polyps, DJD w/ bilat TKRs PLAN >>     We discussed her episode of bronchitis 2/17 (now resolved);  We reviewed her DOE likely due to her obesity/ lack of physical exercise/ deconditioning and she needs to get motivated to lose the weight & increase her exercise program;  We held-off on doing PFTs at this point while she gets on track w/ diet & exercise;  She is a never smoker w/ several bilat <39mm pulm nodules and the  plan is to follow radiologies rec for repeat CT Chest in 33mo=> due Aug2017... She will call for any breathing issues in the interim.  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    Morbid Obesity w/ OSA & prob restrictive lung dis=> we reviewed the importance of DIET/ EXERCISE/ Wt reduction strategies...    Cardiovasc issues>  HBP, Venous insuffic/ stasis changes/ edema in LEs    Medical issues> HH/GERD, colon polyps, abd wall hernia, DJD w/ bilat TKRs, calcif left breast lesion Exam shows Afeb, VSS, O2sat=99% on RA at rest; Wt=301, 5'8"Tall, BMI=46;  Heent- neg, mallampati2;  Chest- clear w/o w/r/r;  Heart- RR w/o m/r/g;  Abd- obese, panniculus, nontender;  Ext- VI & stasis changes;  Neuro- intact.   LABS from [facility] 03/2016>  Chol 177 & FLP parameters at at goals;  Chems- wnl w/ BS=100, Cr=0.95;  CBC- sl anemia w/ Hg=11.8;  TSH=1.71 IMP/PLAN>>  [name] is stable- same amt of dyspnea but hasn't lost any wt & not exercising, we reviewed the importance of diet & exercise for wt reduction;  We decided to wait til her next OV for f/u CT Chest;  We will refer her to GI- DrPyrtle for abd wall (incisional hernia) & colonoscopy due. Note: >50% of this 25 min  visit was spent in counseling & coordination of care...  ~  October 31, 2016:  77mo ROV & pulmonary follow up visit>  Stina has established w/ [facility]- Dr.SBurns & PA-GCalone>  She returns today & had her f/u CT Chest this AM=> no change in the 4mm RML nodule or the scattered 2-1mm nodules in upper lobes;  She has VV/ VI which is improved on Rx from wound clinic Rx; she saw a vascular spec in [place] who rec vein ablation but she is reluctant & we discussed 2nd opinion from VVS here in [place]... See prob list above... Exam shows Afeb, VSS, O2sat=99% on RA at rest; Wt=301, 5'8"Tall, BMI=46;  Heent- neg, mallampati2;  Chest- clear w/o w/r/r;  Heart- RR w/o m/r/g;  Abd- obese, panniculus, nontender;  Ext- VI & stasis changes;  Neuro- intact.   CT Chest 10/31/16>  Norm  heart size, atherosclerotic calcif in Ao, dominant 4mm nodule in RML- unchanged, additional scat nodules betw 2-37mm in both upper lobes- unchanged, mild atx in RML/ RLL, mild hepatic steatosis & vasc calcif in abd, chr rim-calcif lesion in left breast- prob fat necrosis & chronic, degen changes in Tspine...  IMP/PLAN>>  CT is stable & she is reassured & we will continue to follow;  We again reviewed the need for diet, exercise, wt reduction; plan rov 42mo...   ~  May 04, 2017:  42mo ROV & pulmonary follow up visit> Atia returns for pulm f/u visit- CT Chest 10/2016 showed no change in the 4mm index nodule in RML or the scattered 2-30mm nodules elsewhere;  In the meanwhile she saw [facility] for [facility] regarding her VV/ VI and had laser ablation of the right GSV for chr swelling & skin changes- she indicates feeling better at this point, less tightness & smoother skin; she is considering a similar procedure on the left leg but holding off for now... Her PCP is DrEggleston-Clark in [place] seen 10/2016, BP controlled, meds reviewed, noted that she is on VitD 10K daily from her "wound care physician"...    From the pulmonary standpoint she  notes that her breathing is OK but she notes DOE quickly w/ ambulation, no cough/ sput/ hemoptysis, denies CP/ palpit/ etc;  We discussed the need for diet/ exercise/ & wt reduction to help her DOE complaints...     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    Morbid Obesity w/ OSA & prob restrictive lung dis=> we reviewed the importance of DIET/ EXERCISE/ Wt reduction strategies...    Cardiovasc issues>  HBP, Venous insuffic/ stasis changes/ edema in LEs    Medical issues> HH/GERD, colon polyps, abd wall hernia, DJD w/ bilat TKRs, calcif left breast lesion  Exam shows Afeb, VSS, O2sat=98% on RA at rest; Wt=296#, 5'8"Tall, BMI=45;  Heent- neg, mallampati2;  Chest- clear w/o w/r/r;  Heart- RR w/o m/r/g;  Abd- obese, panniculus, nontender;  Ext- VI & stasis changes;  Neuro- intact.   CXR 05/04/17>  Heart is at the upper lim of norm, clear lungs w/ one ~43mm nodule noted in the right mid lung (unchanged), degen changes in Tspine... IMP/PLAN>>  Aljean is stable overall, she understands that her dyspnea is multifactorial & that poor conditioning/ lack of exercise are partially to blame; substantial improvement in her shortness of breath will require a commitment to DIET/ WEIGHT REDUCTION/ & EXERCISE; we will continue to follow CXRs and consider additional CT if needed...  ~  November 16, 2017:  42mo ROV & Kareli reports that she has fallen x2 (Dec in kitchen & Jan at [facility]) w/ mild  concussion she says, husb has stage 4 lung cancer- s/p chemo & XRT (now on Keytruda); she reports breathing well- denies cough, sput, hemoptysis, or SOB; she has chr stable DOE but ok w/ ADLs> she is not on any pulm  meds, has been counseled on DIET/ EXERCISE/ WEIGHT REDUCTION but has been unable to lose the weight...     We reviewed problem list above- she wants to wait 95mo for f/u CT Chest & OV on the same day ([place]Marland Kitchen.Marland Kitchen.     Exam shows Afeb, VSS, O2sat=98% on RA at rest; Wt=297#, 5'8"Tall, BMI=45;  Heent- neg, mallampati2;  Chest- clear w/o w/r/r;  Heart- RR w/o m/r/g;  Abd- obese, panniculus, nontender;  Ext- VI & stasis changes;  Neuro- intact.  IMP/PLAN>>  Markayla has hx abn CXR/ CT Chest w/ bilat subcentimeter pulm nodules (no change 2/17 - 2/18 scans), she is a never smoker & no hx underlying Ca etc;  Hx multifactorial dyspnea- weight & deconditioning are the major factors but she has been unable to lose weight & hasn't been exercising despite recommendations; [place]SA [place] but was intol to CPAP & refused rx; Obesity & prob restrictive lung dis- again stressed the importance of weight reduction & exercise;  We plan ROV w/ CT Chest in 95mo...   ~  May 17, 2018:  95mo ROV & here to review f/u CT Chest>         Past Medical History:  Diagnosis Date  . Anemia   . Anxiety   . Arthritis   . Back pain    reason unknown  . Cataract    bilateral immature  . Diarrhea   . Diverticulosis   . GERD (gastroesophageal reflux disease)    takes Omeprazole daily  . Headache(784.0)    occasionally  . Hiatal hernia   . History of blood transfusion    no abnormal reaction noted  . History of bronchitis 2014  . History of colon polyps   . History of shingles   . Hypertension    takes Metoprolol,Amlodipine and Losartan daily  . Internal hemorrhoids   . Joint pain   . Joint swelling   . Lung nodule   . Nocturia   . Numbness    hands occasionally  .  Osteoarthritis   . Peripheral edema    takes HCTZ daily and Furosemide daily as needed  . PONV (postoperative nausea and vomiting)   . Redness    to both lower extremities  . Shortness of breath    WITH EXERTION   . Sleep apnea    doesn't use cpap  . Urinary frequency   . Urinary urgency   . Venous insufficiency     Past Surgical History:  Procedure Laterality Date  . ABDOMINAL HYSTERECTOMY  1983  . ANKLE SURGERY Left 1993  . ANTERIOR CRUCIATE LIGAMENT REPAIR Left 2013  . BREAST SURGERY     LEFT SIDE BIOPSY 1989  . CHOLECYSTECTOMY  1990  . COLONOSCOPY    . ENDOVENOUS ABLATION SAPHENOUS VEIN W/ LASER Right 04/17/2017   endovenous laser ablation R GSV by Josephina GipJames Lawson MD  . ESOPHAGOGASTRODUODENOSCOPY    . HAND SURGERY Left 2011  . QUADRICEPS TENDON REPAIR  07/16/2012   Procedure: REPAIR QUADRICEP TENDON;  Surgeon: Raymon MuttonStephen D Lucey, MD;  [facility];  Service: Orthopedics;  Laterality: Left;  open repair of VMO   . quiadricp  10'21/2012  . 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   Procedure: TOTAL KNEE ARTHROPLASTY;  Surgeon: Raymon MuttonStephen D Lucey, MD;  Location: [facility] OR;  Service: Orthopedics;  Laterality: Left;  . TOTAL KNEE ARTHROPLASTY Right 11/04/2013   DR Sherlean FootLUCEY  . TOTAL KNEE ARTHROPLASTY Right 11/04/2013  Procedure: RIGHT TOTAL KNEE ARTHROPLASTY;  Surgeon: Dannielle Huh, MD;  Location: [facility] OR;  Service: Orthopedics;  Laterality: Right;  . TUBAL LIGATION  1974  . vasculitic eruption of legs  1985    Outpatient Encounter Medications as of 05/17/2018  Medication Sig  . acetaminophen (TYLENOL) 500 MG tablet Take 500 mg by mouth every 6 (six) hours as needed. For pain  . aspirin 81 MG tablet Take 81 mg by mouth daily.  . Biotin 5000 MCG CAPS Take 1 capsule by mouth daily.  . Calcium-Magnesium-Vitamin D (CALCIUM 1200+D3 PO) Take 1 tablet by mouth daily.  . Cholecalciferol (VITAMIN D3) 10000 units capsule Take 10,000 Units by mouth daily.  . furosemide (LASIX) 40 MG tablet Take 40 mg by  mouth as needed. Water gain   . Glucosamine-Chondroitin (GLUCOSAMINE CHONDR COMPLEX PO) Take 2 capsules by mouth daily.  . hydrochlorothiazide (HYDRODIURIL) 25 MG tablet Take 25 mg by mouth daily.  Marland Kitchen losartan (COZAAR) 100 MG tablet Take 100 mg by mouth daily.  Marland Kitchen MEGARED OMEGA-3 KRILL OIL 500 MG CAPS Take 1 tablet by mouth daily.  . metoprolol tartrate (LOPRESSOR) 25 MG tablet Take 25 mg by mouth 2 (two) times daily.   Marland Kitchen omeprazole (PRILOSEC) 20 MG capsule Take 20 mg by mouth daily.  . potassium gluconate 595 MG TABS tablet Take 550 mg by mouth daily.   Marland Kitchen triamcinolone cream (KENALOG) 0.1 % Apply 1 application topically 3 (three) times daily.  Marland Kitchen triamcinolone lotion (KENALOG) 0.1 % Apply 1 application topically 3 (three) times daily.  . vitamin B-12 (CYANOCOBALAMIN) 1000 MCG tablet Take 1,000 mcg by mouth daily.   No facility-administered encounter medications on file as of 05/17/2018.     Allergies  Allergen Reactions  . Adhesive [Tape] Other (See Comments) and Rash    Skin is very sensitive Skin is very sensitive Skin is very sensitive    Immunization History  Administered Date(s) Administered  . Influenza, High Dose Seasonal PF 07/02/2014, 06/22/2015, 07/05/2016  . Influenza,inj,Quad PF,6+ Mos 06/27/2015  . Pneumococcal Conjugate-13 11/13/2014  . Pneumococcal-Unspecified 09/26/2005  . Tdap 08/04/2011  She is up-to-date on indicated vaccinations per PCP note- 11/30/15   Current Medications, Allergies, Past Medical History, Past Surgical History, Family History, and Social History were reviewed in [employer] record.   Review of Systems             All symptoms NEG except where BOLDED >>  Constitutional:  F/C/S, fatigue, anorexia, unexpected weight change. HEENT:  HA, visual changes, hearing loss, earache, nasal symptoms, sore throat, mouth sores, hoarseness. Resp:  cough, sputum, hemoptysis; SOB, tightness, wheezing. Cardio:  CP, palpit, DOE, orthopnea,  edema. GI:  N/V/D/C, blood in stool; reflux, abd pain, distention, gas. GU:  dysuria, freq, urgency, hematuria, flank pain, voiding difficulty. MS:  joint pain, swelling, tenderness, decr ROM; neck pain, back pain, etc. Neuro:  HA, tremors, seizures, dizziness, syncope, weakness, numbness, gait abn. Skin:  suspicious lesions or skin rash. Heme:  adenopathy, bruising, bleeding. Psyche:  confusion, agitation, sleep disturbance, hallucinations, anxiety, depression suicidal.   Objective:   Physical Exam       Vital Signs:  Reviewed...   General:  WD, obese, 77 y/o WF in NAD; alert & oriented; pleasant & cooperative... HEENT:  /AT; Conjunctiva- pink, Sclera- nonicteric, EOM-wnl, PERRLA, EACs-clear, TMs-wnl; NOSE-clear; THROAT-clear & wnl.  Neck:  Supple w/ fair ROM; no JVD; normal carotid impulses w/o bruits; no thyromegaly or nodules palpated; no lymphadenopathy.  Chest:  Clear  to P & A; without wheezes, rales, or rhonchi heard. Heart:  Regular Rhythm; norm S1 & S2 without murmurs, rubs, or gallops detected. Abdomen:  Obese, soft & nontender- no guarding or rebound; +panniculus, abd wall hernia, normal bowel sounds; no organomegaly or masses palpated. Ext:  decr ROM; s/p bilat TKRs, +arthritic changes; no varicose veins, +venous insuffic/ stasis changes/ edema;  Pulses intact w/o bruits. Neuro:  No focal neuro deficits; gait OK & balance OK. Derm:  No lesions noted; no rash etc. Lymph:  No cervical, supraclavicular, axillary, or inguinal adenopathy palpated.   Assessment:      IMP >>     Abn CXR/ CT Chest Feb2017 in [place] showing bilat subcentimeter pulm nodules, no known underlying cancer, she is a never smoker; plan is to repeat CT Chest after 72mo interval=> sched for 04/2016...    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    Morbid Obesity w/ OSA & prob restrictive lung dis=> we reviewed the importance of DIET/ EXERCISE/ Wt reduction strategies...    Cardiovasc issues>  HBP, Venous insuffic/ stasis changes/ edema in LEs    Medical issues> HH/GERD, colon polyps, abd wall hernia, DJD w/ bilat TKRs  PLAN >>  12/22/15>   We discussed her episode of bronchitis 2/17 (now resolved);  We reviewed her DOE likely due to her obesity/ lack of physical exercise/ deconditioning and she needs to get motivated to lose the weight & increase her exercise program;  We held-off on doing PFTs at this point while she gets on track w/ diet & exercise;  She is a never smoker w/ several bilat <54mm pulm nodules and the plan is to follow radiologies rec for repeat CT Chest in 72mo=> due Aug2017... She will call for any breathing issues in the interim. 05/10/16>   Yovanna is stable- same amt of dyspnea but hasn't lost any wt & not exercising, we reviewed the importance of diet & exercise for wt reduction;  We decided to wait til her next OV for f/u CT Chest;  We will refer her to GI- DrPyrtle for abd wall (incisional hernia) & colonoscopy due 10/31/16>   CT is stable & she is reassured & we will continue to follow;  We again reviewed the need for diet, exercise, wt reduction; plan rov 72mo...  05/04/17>   Channa is stable overall, she understands that her dyspnea is multifactorial & that poor conditioning/ lack of exercise are partially to blame; substantial improvement in her shortness of breath will require a commitment to DIET/ WEIGHT REDUCTION/ & EXERCISE; we will continue to follow CXRs and consider additional CT if needed 11/16/17>   Cailee has hx abn CXR/ CT Chest w/ bilat subcentimeter pulm nodules (no change 2/17 - 2/18 scans), she is a never smoker & no hx underlying Ca etc;  Hx multifactorial dyspnea-  weight & deconditioning are the major factors but she has been unable to lose weight & hasn't been exercising despite recommendations; hx OSA on [place] study in [place] but was intol to CPAP & refused rx; Obesity & prob restrictive lung dis- again stressed the importance of weight reduction & exercise;  We plan ROV w/ CT  Chest in 85mo   Plan:     Patient's Medications  New Prescriptions   No medications on file  Previous Medications   ACETAMINOPHEN (TYLENOL) 500 MG TABLET    Take 500 mg by mouth every 6 (six) hours as needed. For pain   ASPIRIN 81 MG TABLET    Take 81 mg by mouth daily.   BIOTIN 5000 MCG CAPS    Take 1 capsule by mouth daily.   CALCIUM-MAGNESIUM-VITAMIN D (CALCIUM 1200+D3 PO)    Take 1 tablet by mouth daily.   CHOLECALCIFEROL (VITAMIN D3) 10000 UNITS CAPSULE    Take 10,000 Units by mouth daily.   FUROSEMIDE (LASIX) 40 MG TABLET    Take 40 mg by mouth as needed. Water gain    GLUCOSAMINE-CHONDROITIN (GLUCOSAMINE CHONDR COMPLEX PO)    Take 2 capsules by mouth daily.   HYDROCHLOROTHIAZIDE (HYDRODIURIL) 25 MG TABLET    Take 25 mg by mouth daily.   LOSARTAN (COZAAR) 100 MG TABLET    Take 100 mg by mouth daily.   MEGARED OMEGA-3 KRILL OIL 500 MG CAPS    Take 1 tablet by mouth daily.   METOPROLOL TARTRATE (LOPRESSOR) 25 MG TABLET    Take 25 mg by mouth 2 (two) times daily.    OMEPRAZOLE (PRILOSEC) 20 MG CAPSULE    Take 20 mg by mouth daily.   POTASSIUM GLUCONATE 595 MG TABS TABLET    Take 550 mg by mouth daily.    TRIAMCINOLONE CREAM (KENALOG) 0.1 %    Apply 1 application topically 3 (three) times daily.   TRIAMCINOLONE LOTION (KENALOG) 0.1 %    Apply 1 application topically 3 (three) times daily.   VITAMIN B-12 (CYANOCOBALAMIN) 1000 MCG TABLET    Take 1,000 mcg by mouth daily.  Modified Medications   No medications on file  Discontinued Medications   No medications on file

## 2018-07-03 ENCOUNTER — Other Ambulatory Visit: Payer: Self-pay | Admitting: Surgery

## 2018-07-03 DIAGNOSIS — K432 Incisional hernia without obstruction or gangrene: Secondary | ICD-10-CM

## 2018-07-16 ENCOUNTER — Ambulatory Visit
Admission: RE | Admit: 2018-07-16 | Discharge: 2018-07-16 | Disposition: A | Discharge status: Home / Self Care | Payer: Medicare Other | Source: Ambulatory Visit | Attending: Surgery | Admitting: Surgery

## 2018-07-16 DIAGNOSIS — K432 Incisional hernia without obstruction or gangrene: Secondary | ICD-10-CM

## 2018-12-03 ENCOUNTER — Observation Stay (HOSPITAL_COMMUNITY)
Admission: EM | Admit: 2018-12-03 | Discharge: 2018-12-04 | Disposition: A | Discharge status: Home / Self Care | Payer: Medicare Other | Attending: Family Medicine | Admitting: Family Medicine

## 2018-12-03 ENCOUNTER — Other Ambulatory Visit: Payer: Self-pay

## 2018-12-03 ENCOUNTER — Encounter (HOSPITAL_COMMUNITY): Payer: Self-pay | Admitting: Emergency Medicine

## 2018-12-03 ENCOUNTER — Emergency Department (HOSPITAL_COMMUNITY): Payer: Medicare Other

## 2018-12-03 DIAGNOSIS — Z7982 Long term (current) use of aspirin: Secondary | ICD-10-CM | POA: Insufficient documentation

## 2018-12-03 DIAGNOSIS — I10 Essential (primary) hypertension: Secondary | ICD-10-CM | POA: Diagnosis present

## 2018-12-03 DIAGNOSIS — I5031 Acute diastolic (congestive) heart failure: Secondary | ICD-10-CM | POA: Diagnosis not present

## 2018-12-03 DIAGNOSIS — I480 Paroxysmal atrial fibrillation: Secondary | ICD-10-CM | POA: Diagnosis present

## 2018-12-03 DIAGNOSIS — I739 Peripheral vascular disease, unspecified: Secondary | ICD-10-CM | POA: Diagnosis present

## 2018-12-03 DIAGNOSIS — I110 Hypertensive heart disease with heart failure: Secondary | ICD-10-CM | POA: Insufficient documentation

## 2018-12-03 DIAGNOSIS — R0602 Shortness of breath: Secondary | ICD-10-CM | POA: Diagnosis present

## 2018-12-03 DIAGNOSIS — K219 Gastro-esophageal reflux disease without esophagitis: Secondary | ICD-10-CM | POA: Insufficient documentation

## 2018-12-03 DIAGNOSIS — Z79899 Other long term (current) drug therapy: Secondary | ICD-10-CM | POA: Insufficient documentation

## 2018-12-03 DIAGNOSIS — I872 Venous insufficiency (chronic) (peripheral): Secondary | ICD-10-CM | POA: Diagnosis not present

## 2018-12-03 DIAGNOSIS — Z96651 Presence of right artificial knee joint: Secondary | ICD-10-CM | POA: Insufficient documentation

## 2018-12-03 DIAGNOSIS — I5032 Chronic diastolic (congestive) heart failure: Secondary | ICD-10-CM | POA: Diagnosis present

## 2018-12-03 DIAGNOSIS — I4819 Other persistent atrial fibrillation: Secondary | ICD-10-CM | POA: Diagnosis not present

## 2018-12-03 LAB — BASIC METABOLIC PANEL
Anion gap: 10 (ref 5–15)
BUN: 19 mg/dL (ref 8–23)
CO2: 25 mmol/L (ref 22–32)
Calcium: 9.4 mg/dL (ref 8.9–10.3)
Chloride: 102 mmol/L (ref 98–111)
Creatinine, Ser: 0.98 mg/dL (ref 0.44–1.00)
GFR calc Af Amer: >60 mL/min (ref >60)
GFR calc non Af Amer: 55 mL/min — ABNORMAL LOW (ref >60)
Glucose, Bld: 102 mg/dL — ABNORMAL HIGH (ref 70–99)
Potassium: 3.9 mmol/L (ref 3.5–5.1)
Sodium: 137 mmol/L (ref 135–145)

## 2018-12-03 LAB — CBC
HCT: 41.8 % (ref 36.0–46.0)
Hemoglobin: 12.7 g/dL (ref 12.0–15.0)
MCH: 27.6 pg (ref 26.0–34.0)
MCHC: 30.4 g/dL (ref 30.0–36.0)
MCV: 90.9 fL (ref 80.0–100.0)
Platelets: 278 10*3/uL (ref 150–400)
RBC: 4.6 MIL/uL (ref 3.87–5.11)
RDW: 15.1 % (ref 11.5–15.5)
WBC: 8.7 10*3/uL (ref 4.0–10.5)
nRBC: 0 % (ref 0.0–0.2)

## 2018-12-03 LAB — MAGNESIUM: Magnesium: 1.4 mg/dL — ABNORMAL LOW (ref 1.7–2.4)

## 2018-12-03 LAB — TROPONIN I: Troponin I: <0.03 ng/mL (ref <0.03)

## 2018-12-03 LAB — PROTIME-INR
INR: 1.1 (ref 0.8–1.2)
Prothrombin Time: 14.1 seconds (ref 11.4–15.2)

## 2018-12-03 LAB — BRAIN NATRIURETIC PEPTIDE: B Natriuretic Peptide: 187 pg/mL — ABNORMAL HIGH (ref 0.0–100.0)

## 2018-12-03 LAB — D-DIMER, QUANTITATIVE: D-Dimer, Quant: 0.51 ug/mL-FEU — ABNORMAL HIGH (ref 0.00–0.50)

## 2018-12-03 MED ORDER — HYDROCHLOROTHIAZIDE 25 MG PO TABS
25.0000 mg | ORAL_TABLET | Freq: Every day | ORAL | Status: DC
  Filled 2018-12-03: qty 1

## 2018-12-03 MED ORDER — ACETAMINOPHEN 650 MG RE SUPP: 650.0000 mg | Freq: Four times a day (QID) | RECTAL | Status: DC | PRN

## 2018-12-03 MED ORDER — OXYCODONE-ACETAMINOPHEN 5-325 MG PO TABS: 1.0000 | ORAL_TABLET | Freq: Once | ORAL | Status: DC

## 2018-12-03 MED ORDER — METOPROLOL TARTRATE 50 MG PO TABS
50.0000 mg | ORAL_TABLET | Freq: Two times a day (BID) | ORAL | Status: DC
  Administered 2018-12-04: 50 mg via ORAL
  Filled 2018-12-03: qty 1

## 2018-12-03 MED ORDER — METOPROLOL TARTRATE 50 MG PO TABS
50.0000 mg | ORAL_TABLET | Freq: Once | ORAL | Status: AC
  Administered 2018-12-03: 50 mg via ORAL
  Filled 2018-12-03: qty 1

## 2018-12-03 MED ORDER — FUROSEMIDE 40 MG PO TABS
20.0000 mg | ORAL_TABLET | Freq: Once | ORAL | Status: AC
  Administered 2018-12-03: 20 mg via ORAL
  Filled 2018-12-03: qty 1

## 2018-12-03 MED ORDER — APIXABAN 5 MG PO TABS
5.0000 mg | ORAL_TABLET | Freq: Two times a day (BID) | ORAL | Status: DC
  Administered 2018-12-03 – 2018-12-04 (×2): 5 mg via ORAL
  Filled 2018-12-03 (×2): qty 1

## 2018-12-03 MED ORDER — MAGNESIUM SULFATE 2 GM/50ML IV SOLN
3.0000 g | Freq: Once | INTRAVENOUS | Status: AC
  Administered 2018-12-03: 3 g via INTRAVENOUS
  Filled 2018-12-03: qty 100

## 2018-12-03 MED ORDER — PANTOPRAZOLE SODIUM 40 MG PO TBEC
40.0000 mg | DELAYED_RELEASE_TABLET | Freq: Every day | ORAL | Status: DC
  Administered 2018-12-04: 40 mg via ORAL
  Filled 2018-12-03: qty 1

## 2018-12-03 MED ORDER — FUROSEMIDE 20 MG PO TABS
20.0000 mg | ORAL_TABLET | Freq: Every day | ORAL | Status: DC
  Administered 2018-12-04: 20 mg via ORAL
  Filled 2018-12-03: qty 1

## 2018-12-03 MED ORDER — LOSARTAN POTASSIUM 25 MG PO TABS
100.0000 mg | ORAL_TABLET | Freq: Every day | ORAL | Status: DC
  Administered 2018-12-04: 100 mg via ORAL
  Filled 2018-12-03: qty 4

## 2018-12-03 MED ORDER — ACETAMINOPHEN 325 MG PO TABS: 650.0000 mg | ORAL_TABLET | Freq: Four times a day (QID) | ORAL | Status: DC | PRN

## 2018-12-03 NOTE — ED Provider Notes (Signed)
[facility] EMERGENCY DEPARTMENT Provider Note   CSN: [id number] Arrival date & time: 12/03/18  1642    History   Chief Complaint Chief Complaint  Patient presents with  . Atrial Fibrillation    HPI [name] is a 78 y.o. female.     Pt presents to the ED for possible new onset A fib after being seen at urgent care this afternoon. Pt reports she was diagnosed with PNA about 2 weeks ago and was taking antibiotics for it. She returned to urgent care today for continued shortness of breath and was found to be in A fib while there. Pt was then sent to the ED for continued evaluation. While at [facility] she had repeat CXR which she was called about in the ED and told it was clear; she had bloodwork sent off as well. She has never been told she was in a fib before. Reports that she has been experiencing worsening shortness of breath for the past 4 days and thought it was just due to her PNA. Pt also complains of increased leg swelling although reports not taking her Lasix as prescribed due to increase in urination which she does not like. Pt denies chest pain, fever, abdominal pain, nausea, vomiting, or any other associated symptoms.      Past Medical History:  Diagnosis Date  . Anemia   . Anxiety   . Arthritis   . Back pain    reason unknown  . Cataract    bilateral immature  . Diarrhea   . Diverticulosis   . GERD (gastroesophageal reflux disease)    takes Omeprazole daily  . Headache(784.0)    occasionally  . Hiatal hernia   . History of blood transfusion    no abnormal reaction noted  . History of bronchitis 2014  . History of colon polyps   . History of shingles   . Hypertension    takes Metoprolol,Amlodipine and Losartan daily  . Internal hemorrhoids   . Joint pain   . Joint swelling   . Lung nodule   . Nocturia   . Numbness    hands occasionally  . Osteoarthritis   . Peripheral edema    takes HCTZ daily and Furosemide daily as needed  . PONV (postoperative nausea  and vomiting)   . Redness    to both lower extremities  . Shortness of breath    WITH EXERTION   . Sleep apnea    doesn't use cpap  . Urinary frequency   . Urinary urgency   . Venous insufficiency     Patient Active Problem List   Diagnosis Date Noted  . Acute diastolic heart failure (HCC) 12/03/2018  . Varicose veins of right lower extremity with complications 04/03/2017  . Edema 10/31/2016  . Prophylactic antibiotic for dental procedure indicated due to prior joint replacement 09/29/2016  . Essential hypertension 09/29/2016  . GERD (gastroesophageal reflux disease) 09/29/2016  . Ventral hernia 09/29/2016  . Dyspnea 05/10/2016  . Obesity 05/10/2016  . Abdominal wall hernia 05/10/2016  . Chronic venous insufficiency 05/10/2016  . Venous stasis dermatitis of both lower extremities 05/10/2016  . Abnormal CT of the chest 12/22/2015  . Multiple pulmonary nodules 12/22/2015  . 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 ABDOMINAL HYSTERECTOMY  1983  . ANKLE SURGERY Left 1993  . ANTERIOR CRUCIATE LIGAMENT REPAIR Left 2013  . BREAST SURGERY     LEFT SIDE BIOPSY 1989  .  CHOLECYSTECTOMY  1990  . COLONOSCOPY    . ENDOVENOUS ABLATION SAPHENOUS VEIN W/ LASER Right 04/17/2017   endovenous laser ablation R GSV by Josephina Gip MD  . ESOPHAGOGASTRODUODENOSCOPY    . HAND SURGERY Left 2011  . QUADRICEPS TENDON REPAIR  07/16/2012   Procedure: REPAIR QUADRICEP TENDON;  Surgeon: Raymon Mutton, MD;  Location: [facility] OR;  Service: Orthopedics;  Laterality: Left;  open repair of VMO   . quiadricp  10'21/2012  . 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 TOTAL KNEE ARTHROPLASTY Right 11/04/2013   Procedure: RIGHT TOTAL KNEE ARTHROPLASTY;  Surgeon: Dannielle Huh, MD;   Location: [facility] OR;  Service: Orthopedics;  Laterality: Right;  . TUBAL LIGATION  1974  . vasculitic eruption of legs  1985     OB History   No obstetric history on file.      Home Medications    Prior to Admission medications   Medication Sig Start Date End Date Taking? Authorizing Provider  acetaminophen (TYLENOL) 500 MG tablet Take 500 mg by mouth every 6 (six) hours as needed. For pain   Yes [provider]  aspirin 81 MG tablet Take 81 mg by mouth daily.   Yes [provider]  Biotin 5000 MCG CAPS Take 1 capsule by mouth daily.   Yes [provider]  Chlorpheniramine-DM (CORICIDIN COUGH/COLD) 4-30 MG TABS Take 1-2 tablets by mouth daily as needed (for cough and clold).   Yes [provider]  Cholecalciferol (VITAMIN D3) 10000 units capsule Take 10,000 Units by mouth daily.   Yes [provider]  [employer] FLUTICASONE PROPIONATE 50 MCG/ACT nasal spray Place 2 sprays into both nostrils daily as needed for allergies or rhinitis.  11/23/18  Yes [provider]  furosemide (LASIX) 40 MG tablet Take 40 mg by mouth daily as needed for fluid. Water gain    Yes [provider]  Glucosamine-Chondroitin (GLUCOSAMINE CHONDR COMPLEX PO) Take 1 capsule by mouth daily.    Yes [provider]  guaiFENesin (MUCINEX) 600 MG 12 hr tablet Take 600 mg by mouth 2 (two) times daily as needed for cough or to loosen phlegm.   Yes [provider]  hydrochlorothiazide (HYDRODIURIL) 25 MG tablet Take 25 mg by mouth daily.   Yes [provider]  ibuprofen (ADVIL,MOTRIN) 200 MG tablet Take 200 mg by mouth every 6 (six) hours as needed for mild pain or moderate pain.   Yes [provider]  loratadine (CLARITIN) 10 MG tablet Take 10 mg by mouth daily as needed for allergies.   Yes [provider]  losartan (COZAAR) 100 MG tablet Take 100 mg by mouth daily.   Yes [provider]  metoprolol tartrate (LOPRESSOR)  25 MG tablet Take 25 mg by mouth 2 (two) times daily.    Yes [provider]  Omega-3 Fatty Acids (OMEGA-3 FISH OIL PO) Take 1 capsule by mouth daily.   Yes [provider]  omeprazole (PRILOSEC) 20 MG capsule Take 20 mg by mouth daily.   Yes [provider]  potassium gluconate 595 MG TABS tablet Take 1,190 mg by mouth daily.    Yes [provider]  triamcinolone cream (KENALOG) 0.1 % Apply 1 application topically 3 (three) times daily.   Yes [provider]  triamcinolone lotion (  KENALOG) 0.1 % Apply 1 application topically 3 (three) times daily.   Yes [provider]  VENTOLIN HFA 108 (90 Base) MCG/ACT inhaler Inhale 2 puffs into the lungs every 4 (four) hours as needed for wheezing or shortness of breath.  11/22/18  Yes [provider]  vitamin B-12 (CYANOCOBALAMIN) 1000 MCG tablet Take 1,000 mcg by mouth daily.   Yes [provider]  cefUROXime (CEFTIN) 500 MG tablet Take 500 mg by mouth 2 (two) times daily with a meal. 10 day course completed on 12/02/2018 11/22/18   [provider]  doxycycline (VIBRA-TABS) 100 MG tablet Take 100 mg by mouth 2 (two) times daily. for 10 days 11/22/18   [provider]    Family History Family History  Problem Relation Age of Onset  . Lymphoma Father   . Leukemia Father   . Heart attack Father   . Hypertension Mother   . Emphysema Mother   . Heart disease Sister   . Rashes / Skin problems Sister   . Heart failure Sister   . Kidney disease Sister   . Heart disease Maternal Grandfather   . Heart disease Paternal Grandmother   . Heart disease Paternal Grandfather     Social History Social History   Tobacco Use  . Smoking status: Never Smoker  . Smokeless tobacco: Never Used  Substance Use Topics  . Alcohol use: No    Alcohol/week: 0.0 standard drinks  . Drug use: No     Allergies   Sulfa antibiotics and Adhesive [tape]   Review of Systems Review of  Systems  Constitutional: Negative for chills, diaphoresis and fever.  HENT: Positive for sinus pressure. Negative for sore throat.   Eyes: Negative for visual disturbance.  Respiratory: Positive for cough and shortness of breath.   Cardiovascular: Positive for leg swelling. Negative for chest pain and palpitations.  Gastrointestinal: Negative for abdominal pain, nausea and vomiting.  Endocrine: Negative for polydipsia, polyphagia and polyuria.  Musculoskeletal: Negative for myalgias.  Skin: Negative for rash.  Neurological: Negative for dizziness, light-headedness and headaches.     Physical Exam Updated Vital Signs BP (!) 168/80 (BP Location: Right Arm)   Pulse 91   Temp 98 F (36.7 C) (Oral)   Resp (!) 22   Ht 5\' 8"  (1.727 m)   Wt 131.5 kg   LMP 02/07/2012   SpO2 98%   BMI 44.09 kg/m   Physical Exam Vitals signs and nursing note reviewed.  Constitutional:      Appearance: Normal appearance. She is not ill-appearing or diaphoretic.  HENT:     Head: Normocephalic and atraumatic.     Right Ear: Tympanic membrane normal.     Left Ear: Tympanic membrane normal.     Nose: Nose normal.     Mouth/Throat:     Mouth: Mucous membranes are moist.     Pharynx: No oropharyngeal exudate.  Eyes:     Conjunctiva/sclera: Conjunctivae normal.  Neck:     Musculoskeletal: Neck supple.  Cardiovascular:     Rate and Rhythm: Normal rate. Rhythm irregular.     Pulses: Normal pulses.  Pulmonary:     Effort: Pulmonary effort is normal.     Breath sounds: Wheezing present.  Abdominal:     Palpations: Abdomen is soft.     Tenderness: There is no abdominal tenderness.  Musculoskeletal:        General: Swelling (1+ pitting edema) present.  Lymphadenopathy:     Cervical: No cervical adenopathy.  Skin:    General: Skin is warm and dry.  Neurological:     Mental Status: She is alert.      ED Treatments / Results  Labs (all labs ordered are listed, but only abnormal results are  displayed) Labs Reviewed  BASIC METABOLIC PANEL - Abnormal; Notable for the following components:      Result Value   Glucose, Bld 102 (*)    GFR calc non Af Amer 55 (*)    All other components within normal limits  D-DIMER, QUANTITATIVE (NOT AT [facility]) - Abnormal; Notable for the following components:   D-Dimer, Quant 0.51 (*)    All other components within normal limits  BRAIN NATRIURETIC PEPTIDE - Abnormal; Notable for the following components:   B Natriuretic Peptide 187.0 (*)    All other components within normal limits  TROPONIN I    EKG EKG Interpretation  Date/Time:  Monday December 03 2018 17:11:29 EDT Ventricular Rate:  94 PR Interval:    QRS Duration: 78 QT Interval:  374 QTC Calculation: 468 R Axis:   76 Text Interpretation:  Atrial fibrillation Low voltage, extremity and precordial leads Probable anteroseptal infarct, old Since last tracing afib now present Confirmed by Eber Hong ([id number]) on 12/03/2018 5:16:16 PM Also confirmed by Bethann Berkshire [phone])  on 12/03/2018 7:30:47 PM   Radiology Dg Chest 2 View  Result Date: 12/03/2018 CLINICAL DATA:  Shortness of breath EXAM: CHEST - 2 VIEW COMPARISON:  09/28/2017 FINDINGS: The heart size and mediastinal contours are within normal limits. Both lungs are clear. The visualized skeletal structures are unremarkable. IMPRESSION: No active cardiopulmonary disease. Electronically Signed   By: Deatra Robinson M.D.   On: 12/03/2018 20:18    Procedures Procedures (including critical care time)  Medications Ordered in ED Medications  apixaban (ELIQUIS) tablet 5 mg (5 mg Oral Given 12/03/18 2142)  furosemide (LASIX) tablet 20 mg (20 mg Oral Given 12/03/18 2140)  metoprolol tartrate (LOPRESSOR) tablet 50 mg (50 mg Oral Given 12/03/18 2140)     Initial Impression / Assessment and Plan / ED Course  I have reviewed the triage vital signs and the nursing notes.  Pertinent labs & imaging results that were available during my care of the  patient were reviewed by me and considered in my medical decision making (see chart for details).    Pt presents to the ED from urgent care in A fib; diagnosed today while at [facility]. Currently complaining of SOB; recently treated with antibiotics including doxycycline and ceftriaxone for PNA and sinusitis. Pt was seen today for continued shortness of breath and told to go to the ED for a fib. While in the room pt converted out of a fib into sinus rhythm; reports SOB went away at that exact moment. 1+ pitting edema which is worse than normal per patient although she is supposed to take 40 mg Lasix but does not take it due to increased urination. Had CXR done today at [facility] with no acute findings; discharged with bronchitis and sinusitis; given IM ceftriaxone today for sinusitis. BP elevated at 192/92 in ED. Chart review with normal CBC; unable to obtain results of CXR or images; will get repeat CXR in ED today as well as BMP, troponin, and d dimer  7:24 PM Pt back in a fib upon reevaluation; currently rate controlled with metoprolol at home for BP. BP elevated currently at 180/74. Will discuss case with attending physician Dr. Hyacinth Meeker concerning need for rhythm control at the moment.  8:10 PM D-dimer elevated at 0.51. Age adjusted score 0.78; low suspicion for PE. All other labs unremarkable; CBC at urgent care today without anemia or leukocytosis (see chart review).   8:48 PM Dr. Hyacinth Meeker evaluated patient; suspect symptoms are due to undiagnosed CHF considering 1+ pitting edema and dyspnea on exertion for the last couple of days. CHF could have strained heart enough to go into A fib although difficult to say how long pt has had a fib at this point; will consult cardiology to discuss options for patient.   10:10 PM Pt to be admitted to cardiology services for further workup including echo. First dose of eliquis ordered prior to admission. 50 mg Metoprolol also given as this is pt's regular home dose as well as  20 mg Lasix to help with fluid overload.       Final Clinical Impressions(s) / ED Diagnoses   Final diagnoses:  Persistent atrial fibrillation    ED Discharge Orders    None       Tanda Rockers, PA-C 12/03/18 2345    Eber Hong, MD 12/04/18 [phone]

## 2018-12-03 NOTE — ED Provider Notes (Signed)
Patient is a very pleasant 78 year old female who recently was treated for bronchial pneumonia.  She had been treated for this with antibiotics, she has been on some over-the-counter medications, she presents with shortness of breath over the last several days which is gradually worsened.  She has not done very much today but when she went back to the urgent care to be rechecked she was found to be in atrial fibrillation.  The patient is already taking metoprolol but is not on any anticoagulants and is never had any cardiac abnormalities, has never seen a cardiologist, has never had any arrhythmias.  On exam the patient has a very slight tachycardia around 100 to 105 bpm in atrial fibrillation.  She has no murmurs, normal pulses, no edema, she is well-appearing speaking in full sentences with clear lung sounds.  Labs are reassuring, normal troponin CBC as checked at the prehospital urgent care as well as a basic metabolic panel.  Chest x-ray is unremarkable.  Will discuss with cardiologist regarding anticoagulation and admission versus home.  The patient otherwise is well-appearing  The patient does have increasing dyspnea and shortness of breath at home.  We will add on a BNP, the patient will need to be admitted to the hospital, we will give her some Lasix, she will benefit from a cardiology consult and likely an echocardiogram given her increasing severe dyspnea.   EKG Interpretation  Date/Time:  Monday December 03 2018 17:11:29 EDT Ventricular Rate:  94 PR Interval:    QRS Duration: 78 QT Interval:  374 QTC Calculation: 468 R Axis:   76 Text Interpretation:  Atrial fibrillation Low voltage, extremity and precordial leads Probable anteroseptal infarct, old Since last tracing afib now present Confirmed by Eber Hong ([id number]) on 12/03/2018 5:16:16 PM Also confirmed by Bethann Berkshire [phone])  on 12/03/2018 7:30:47 PM      I discussed care with the cardiologist, Dr. Okey Dupre as well as with the  hospitalist, the hospitalist will admit for further evaluation and likely echocardiogram in the morning.  Medical screening examination/treatment/procedure(s) were conducted as a shared visit with non-physician practitioner(s) and myself.  I personally evaluated the patient during the encounter.  Clinical Impression:   Final diagnoses:  Persistent atrial fibrillation         Eber Hong, MD 12/04/18 [phone]

## 2018-12-03 NOTE — ED Triage Notes (Signed)
Sent by urgent care.  Pt has been tx for pne for past 12 days and is not getting any better.  Pt feels sob and bilateral lower extremity edema.  Was told she is in Afib at urgent care which is a new dx.

## 2018-12-03 NOTE — H&P (Signed)
History and Physical    PLEASE NOTE THAT DRAGON DICTATION SOFTWARE WAS USED IN THE CONSTRUCTION OF THIS NOTE.   [name] DEY:[id number] DOB: [birth year] DOA: 12/03/2018  PCP: Cathie Olden, MD Patient coming from: home  I have personally briefly reviewed patient's old medical records in [place]  Chief Complaint: shortness of breath  HPI: [name] is a 78 y.o. female with medical history significant for chronic peripheral edema in the setting of chronic venous insufficiency, obstructive sleep apnea not compliant with home CPAP, hypertension, who is admitted to [facility] on 12/03/2018 with new diagnosis of atrial fibrillation of presenting from home to [facility] ED complaining of shortness of breath.   10 to 14 days ago, the patient reports that she developed shortness of breath associated with a productive cough prompting her to present to urgent care at which time she was diagnosed with pneumonia for which she was prescribed and completed 10-day courses of cefuroxime and doxycycline, with final doses of both occurring on 12/02/2018.  However, approximately 1 week ago, the patient's cough became nonproductive, and the nature of her shortness of breath changed as well.  Specifically, she reports shortness of breath with exertion over that time, as well as development of orthopnea, PND, and worsening of her chronic edema in the bilateral lower extremities.  She also reports weight gain of a few pounds over that timeframe in spite of not eating as much over that time.  In the setting of ongoing shortness of breath in spite of good compliance and completing 10-day courses of the aforementioned two and a biotics, the patient presented back to urgent care earlier today, at which time she was noted to be in atrial fibrillation, with ventricular rates in the range of 110 bpm.  As she was felt to be symptomatic in the setting of a new diagnosis of atrial fibrillation, she was  recommended by urgent care to present to the emergency department for further evaluation and management of new diagnosis of atrial fibrillation.  The patient denies any recent subjective fever, chills, rigors, dysuria, or gross hematuria.  Denies any recent nausea or vomiting.  She reports 1-2 daily episodes of loose stool over the last 2 to 3 days while on the above antibiotics, but denies any associated melena or hematochezia.  Denies any recent chest pain, palpitations, diaphoresis, dizziness, presyncope, or syncope.  No recent trauma or hemoptysis.   Past medical history notable for history of obstructive sleep apnea, for which the patient conveys that she does not use home CPAP.  She acknowledges a history of hypertension, for which she is on Lopressor 25 mg p.o. twice daily.  She emphasizes that this beta-blocker was prescribed purely for antihypertensive purposes, and denies any prior cardiac diagnoses, including no history of atrial fibrillation or coronary artery disease.  In the setting of neck peripheral edema due to chronic venous insufficiency status post ablation of the great saphenous vein, the patient is prescribed Lasix 60 mg p.o. daily as needed for increase in peripheral edema on a subjective basis.  She states that she has not recently taken her PRN Lasix.  She reports that she takes a daily baby aspirin, but otherwise is not on any formal anticoagulation. Denies any history of gastrointestinal bleed.    ED Course: Vital signs in the emergency department: Temperature max 98.1, ventricular rate 83-95, initial blood pressure noted to be 180/74, which decreased to 149/81 following administration of Lopressor and Lasix, as further described below; respiratory  rate 22-30; and oxygen saturation 96 to 98% on room air.  Labs in the ED were notable for the following: BMP notable for sodium 137, potassium 3.9, bicarbonate 25, and creatinine 0.98.  BNP 187, without any prior data point available  for point comparison.  CBC was notable for white blood cell count of 8700 with hemoglobin 12.7.  Troponin I x 1 was negative.  D-dimer 0.51.   2 view chest x-ray, per final radiology report showed no evidence of acute cardiopulmonary process, including no evidence of infiltrate, edema, or effusion.  EKG performed in the ED this evening showed atrial fibrillation with ventricular rate of 90, normal axis, potential Q waves in V1 and V2, which were also noted on EKG from January 2019, and no evidence of interval T wave or ST changes.  The emergency department physician discussed the patient's case, including EKG findings, with the on-call cardiologist at [facility], Dr. Kalman Shan, who felt that if the patient was still symptomatic from her atrial fibrillation that overnight observation was reasonable for rate control optimization, diuresis, initiation of anti-coagulation, echocardiogram, and formal cardiology consult on a prn basis.  In the setting of ongoing shortness of breath, the patient was admitted for overnight observation for further evaluation and management of presenting new diagnosis of atrial fibrillation  While in the ED, the following were administered: Eliquis 5 mg p.o. x1, Lasix 20 mg p.o. x1, and Lopressor 50 mg p.o. x1    Review of Systems: As per HPI otherwise 10 point review of systems negative.   Past Medical History:  Diagnosis Date  . Anemia   . Anxiety   . Arthritis   . Back pain    reason unknown  . Cataract    bilateral immature  . Diarrhea   . Diverticulosis   . GERD (gastroesophageal reflux disease)    takes Omeprazole daily  . Headache(784.0)    occasionally  . Hiatal hernia   . History of blood transfusion    no abnormal reaction noted  . History of bronchitis 2014  . History of colon polyps   . History of shingles   . Hypertension    takes Metoprolol,Amlodipine and Losartan daily  . Internal hemorrhoids   . Joint pain   . Joint swelling   . Lung nodule     . Nocturia   . Numbness    hands occasionally  . Osteoarthritis   . Peripheral edema    takes HCTZ daily and Furosemide daily as needed  . PONV (postoperative nausea and vomiting)   . Redness    to both lower extremities  . Shortness of breath    WITH EXERTION   . Sleep apnea    doesn't use cpap  . Urinary frequency   . Urinary urgency   . Venous insufficiency     Past Surgical History:  Procedure Laterality Date  . ABDOMINAL HYSTERECTOMY  1983  . ANKLE SURGERY Left 1993  . ANTERIOR CRUCIATE LIGAMENT REPAIR Left 2013  . BREAST SURGERY     LEFT SIDE BIOPSY 1989  . CHOLECYSTECTOMY  1990  . COLONOSCOPY    . ENDOVENOUS ABLATION SAPHENOUS VEIN W/ LASER Right 04/17/2017   endovenous laser ablation R GSV by Tinnie Gens MD  . ESOPHAGOGASTRODUODENOSCOPY    . HAND SURGERY Left 2011  . QUADRICEPS TENDON REPAIR  07/16/2012   Procedure: REPAIR QUADRICEP TENDON;  Surgeon: Rudean Haskell, MD;  Location: [place];  Service: Orthopedics;  Laterality: Left;  open repair of  VMO   . quiadricp  10'21/2012  . 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   Procedure: TOTAL KNEE ARTHROPLASTY;  Surgeon: Rudean Haskell, MD;  Location: [place];  Service: Orthopedics;  Laterality: Left;  . TOTAL KNEE ARTHROPLASTY Right 11/04/2013   DR Ronnie Derby  . TOTAL KNEE ARTHROPLASTY Right 11/04/2013   Procedure: RIGHT TOTAL KNEE ARTHROPLASTY;  Surgeon: Vickey Huger, MD;  Location: [place];  Service: Orthopedics;  Laterality: Right;  . TUBAL LIGATION  1974  . vasculitic eruption of legs  1985    Social History:  reports that she has never smoked. She has never used smokeless tobacco. She reports that she does not drink alcohol or use drugs.   Allergies  Allergen Reactions  . Sulfa Antibiotics     Reaction unknown  . Adhesive [Tape] Other (See Comments) and Rash    Skin is very sensitive Skin is very sensitive Skin is very sensitive    Family History  Problem Relation Age of Onset  . Lymphoma Father   . Leukemia  Father   . Heart attack Father   . Hypertension Mother   . Emphysema Mother   . Heart disease Sister   . Rashes / Skin problems Sister   . Heart failure Sister   . Kidney disease Sister   . Heart disease Maternal Grandfather   . Heart disease Paternal Grandmother   . Heart disease Paternal Grandfather     Prior to Admission medications   Medication Sig Start Date End Date Taking? Authorizing Provider  acetaminophen (TYLENOL) 500 MG tablet Take 500 mg by mouth every 6 (six) hours as needed. For pain   Yes [provider]  aspirin 81 MG tablet Take 81 mg by mouth daily.   Yes [provider]  Biotin 5000 MCG CAPS Take 1 capsule by mouth daily.   Yes [provider]  Chlorpheniramine-DM (CORICIDIN COUGH/COLD) 4-30 MG TABS Take 1-2 tablets by mouth daily as needed (for cough and clold).   Yes [provider]  Cholecalciferol (VITAMIN D3) 10000 units capsule Take 10,000 Units by mouth daily.   Yes [provider]  [employer] FLUTICASONE PROPIONATE 50 MCG/ACT nasal spray Place 2 sprays into both nostrils daily as needed for allergies or rhinitis.  11/23/18  Yes [provider]  furosemide (LASIX) 40 MG tablet Take 40 mg by mouth daily as needed for fluid. Water gain    Yes [provider]  Glucosamine-Chondroitin (GLUCOSAMINE CHONDR COMPLEX PO) Take 1 capsule by mouth daily.    Yes [provider]  guaiFENesin (MUCINEX) 600 MG 12 hr tablet Take 600 mg by mouth 2 (two) times daily as needed for cough or to loosen phlegm.   Yes [provider]  hydrochlorothiazide (HYDRODIURIL) 25 MG tablet Take 25 mg by mouth daily.   Yes [provider]  ibuprofen (ADVIL,MOTRIN) 200 MG tablet Take 200 mg by mouth every 6 (six) hours as needed for mild pain or moderate pain.   Yes [provider]  loratadine (CLARITIN) 10 MG tablet Take 10 mg by mouth daily as needed for allergies.   Yes [provider]  losartan  (COZAAR) 100 MG tablet Take 100 mg by mouth daily.   Yes [provider]  metoprolol tartrate (LOPRESSOR) 25 MG tablet Take 25 mg by mouth 2 (two) times daily.    Yes [provider]  Omega-3 Fatty Acids (OMEGA-3 FISH OIL PO) Take 1 capsule by mouth daily.   Yes [provider]  omeprazole (PRILOSEC) 20 MG capsule Take 20 mg by mouth daily.   Yes [provider]  potassium gluconate 595 MG TABS tablet Take 1,190 mg by mouth daily.    Yes [provider]  triamcinolone cream (KENALOG) 0.1 % Apply 1 application topically 3 (three) times daily.   Yes [provider]  triamcinolone lotion (KENALOG) 0.1 % Apply 1 application topically 3 (three) times daily.   Yes [provider]  VENTOLIN HFA 108 (90 Base) MCG/ACT inhaler Inhale 2 puffs into the lungs every 4 (four) hours as needed for wheezing or shortness of breath.  11/22/18  Yes [provider]  vitamin B-12 (CYANOCOBALAMIN) 1000 MCG tablet Take 1,000 mcg by mouth daily.   Yes [provider]  cefUROXime (CEFTIN) 500 MG tablet Take 500 mg by mouth 2 (two) times daily with a meal. 10 day course completed on 12/02/2018 11/22/18   [provider]  doxycycline (VIBRA-TABS) 100 MG tablet Take 100 mg by mouth 2 (two) times daily. for 10 days 11/22/18   [provider]     Objective    Physical Exam: Vitals:   12/03/18 1830 12/03/18 1901 12/03/18 1930 12/03/18 2042  BP:  (!) 180/74 (!) 166/74 (!) 168/80  Pulse: 84 83 88 91  Resp:  16 (!) 27 (!) 22  Temp:  97.6 F (36.4 C)  98 F (36.7 C)  TempSrc:  Oral  Oral  SpO2: 97% 97% 97% 98%  Weight:      Height:        General: appears to be stated age; alert, oriented Skin: warm, dry; near circumferential hyperpigmentation associated with the bilateral lower extremities distal to the bilateral knees Head:  AT/Anthony Eyes:  PEARL b/l, EOMI Mouth:  Oral mucosa membranes appear moist, normal dentition Neck:  supple; trachea midline Heart: Irregular; did not appreciate any M/R/G Lungs: Slightly diminished bibasilar breath sounds, but otherwise CTAB, did not appreciate any wheezes, rales, or rhonchi Abdomen: + BS; soft, ND, NT Extremities: 1-2+ edema in the b/l LE's, as above; no muscle wasting   Labs on Admission: I have personally reviewed following labs and imaging studies  CBC: No results for input(s): WBC, NEUTROABS, HGB, HCT, MCV, PLT in the last 168 hours. Basic Metabolic Panel: Recent Labs  Lab 12/03/18 1913  NA 137  K 3.9  CL 102  CO2 25  GLUCOSE 102*  BUN 19  CREATININE 0.98  CALCIUM 9.4   GFR: Estimated Creatinine Clearance: 67.9 mL/min (by C-G formula based on SCr of 0.98 mg/dL). Liver Function Tests: No results for input(s): AST, ALT, ALKPHOS, BILITOT, PROT, ALBUMIN in the last 168 hours. No results for input(s): LIPASE, AMYLASE in the last 168 hours. No results for input(s): AMMONIA in the last 168 hours. Coagulation Profile: No results for input(s): INR, PROTIME in the last 168 hours. Cardiac Enzymes: Recent Labs  Lab 12/03/18 1913  TROPONINI <0.03   BNP (last 3 results) No results for input(s): PROBNP in the last 8760 hours. HbA1C: No results for input(s): HGBA1C in the last 72 hours. CBG: No results for input(s): GLUCAP in the last 168 hours. Lipid Profile: No results for input(s): CHOL, HDL, LDLCALC, TRIG, CHOLHDL, LDLDIRECT in the last 72 hours. Thyroid Function Tests: No results for input(s): TSH, T4TOTAL, FREET4, T3FREE, THYROIDAB in the last 72 hours. Anemia Panel: No results for input(s): VITAMINB12, FOLATE, FERRITIN, TIBC, IRON, RETICCTPCT in the last 72 hours. Urine analysis:    Component Value Date/Time   COLORURINE YELLOW  10/22/2013 [place] 10/22/2013 1126   LABSPEC 1.009 10/22/2013 1126   PHURINE 5.5 10/22/2013 [place] 10/22/2013 [place] 10/22/2013 [place] 10/22/2013  [place] 10/22/2013 1126   PROTEINUR NEGATIVE 10/22/2013 1126   UROBILINOGEN 0.2 10/22/2013 1126   NITRITE NEGATIVE 10/22/2013 [place] 10/22/2013 1126    Radiological Exams on Admission: Dg Chest 2 View  Result Date: 12/03/2018 CLINICAL DATA:  Shortness of breath EXAM: CHEST - 2 VIEW COMPARISON:  09/28/2017 FINDINGS: The heart size and mediastinal contours are within normal limits. Both lungs are clear. The visualized skeletal structures are unremarkable. IMPRESSION: No active cardiopulmonary disease. Electronically Signed   By: Ulyses Jarred M.D.   On: 12/03/2018 20:18     Assessment/Plan   Amere Iott is a 78 y.o. female with medical history significant for chronic peripheral edema in the setting of chronic venous insufficiency, obstructive sleep apnea not compliant with home CPAP, hypertension, who is admitted to [facility] on 12/03/2018 with new diagnosis of atrial fibrillation of presenting from home to [facility] ED complaining of shortness of breath.    Principal Problem:   AF (paroxysmal atrial fibrillation) (HCC) Active Problems:   SOB (shortness of breath)   Chronic venous insufficiency   Essential hypertension   GERD (gastroesophageal reflux disease)   Acute diastolic heart failure (HCC)   Hypomagnesemia   #) Atrial fibrillation: Per patient, this represents a new diagnosis, and no evidence of atrial fibrillation per review of prior available EKGs.  In the setting of symptomatic atrial fibrillation, on-call cardiologist, Dr. Kalman Shan, recommended overnight observation for rate control optimization, diuresis, initiation of anti-coagulation, echocardiogram, and consideration for formal cardiology consult in the morning.  Given that the patient was symptomatic with initial ventricular rates in the 110s, the patient received a dose of Lopressor 50 mg p.o. x1 in the emergency department, which represented an increase from her outpatient  antihypertensive dose of 25 mg p.o. twice daily.  Additionally, on the basis of CHA2DS2-VASs score of 4 conferring a 4% annual thromboembolic risk, criteria are met for initiation of chronic anticoagulation for thromboembolic prophylaxis.  Following my discussion with the patient regarding risks versus benefits versus alternatives regarding initiation of thromboembolic prophylactic anticoagulation in the setting of atrial fibrillation, including a discussion regarding increased risk for bleeding while on anticoagulation, the patient elects to start on anticoagulation via a DOAC.  She received first dose of Eliquis in the ED this evening.  Of note, should ensuing echocardiogram demonstrate that patient's atrial fibrillation is valvular in nature, will need to modify specific anticoagulant choice as the DOAC are not approved for thromboembolic prophylaxis in the setting of valvular atrial fibrillation.  In terms of etiology leading to patient's atrial fibrillation, considerations include physiologic stress related to recent diagnosis of pneumonia versus anticholinergic side effects of multiple antihistamine medications that the patient was taking for symptomatic relief over the last few weeks, including Claritin as well as chlorpheniramine.  Additionally, in the setting of new diagnosis of atrial fibrillation with no prior echocardiogram on file, I have ordered an echocardiogram to occur in the morning for evaluation of structural/mechanical considerations.  No evidence of ACS.  Plan: Lopressor 50 mg p.o. twice daily, which represents an increase relative to outpatient dose, as further described above.  Schedule Lasix 20 mg p.o. daily, given clinical evidence of concomitant heart failure.  Start Eliquis 5 mg p.o. twice daily.  Monitor on telemetry overnight.  Echocardiogram in the morning. Formal cardiology consult in the morning, per request of patient.  Monitor strict I's and O's.  Repeat BMP in the morning and  check serum magnesium level.  Check TSH.    #) Acute diastolic heart failure: In the context of no known history of heart failure, the patient presents with a constellation of symptoms that are consistent with an element of acutely decompensated heart failure.  Specifically, the patient reports 1 week of progressive shortness of breath associated with orthopnea, PND, increase in peripheral edema, and weight gain over that time.  While this history is suggestive of acutely compensated heart failure, is also noted that presenting chest x-ray demonstrates no evidence of acute cardiopulmonary process, including no evidence of infiltrate, edema, or effusion.  Additionally, BNP, in the absence of any prior value for point of comparison, was not significantly elevated at 187, although it is acknowledged that BNP can be falsely depressed in the setting of an obese patient given metabolism of this peptide in peripheral tissue.  Presentation is not associate with any chest pain, and in spite of greater than 1 week of shortness of breath, troponin at presentation found to be negative, rendering ACS to be much less likely.  Additionally, EKG shows no evidence of T wave or ST changes relative to prior EKG performed in January 2019.  Patient received Lasix 20 mg p.o. x1 in the ED this evening.  Plan: Lasix 20 mg p.o. daily.  Monitor strict I's and O's.  Repeat BMP in the morning.  Add on serum magnesium level.  Monitor on telemetry overnight.  Echocardiogram in the morning.    #) Hypomagnesemia: Serum magnesium level found to be 1.4.  Plan: I have ordered magnesium sulfate 3 g IV to be administered over 3 hours.  Will recheck serum magnesium level in the morning.  Monitor on telemetry in the interval.    #) Essential hypertension: On the following antihypertensive regimen as an outpatient: Lopressor 25 mg p.o. twice daily, losartan, HCTZ.  Initial blood pressure in the ED noted to be 180/74, which improved to  149/81 following initiation of Lasix and Lopressor, as further described above.  Will closely monitor ensuing blood pressure, particularly in the setting of increase from baseline dose of Lopressor as well as introduction of scheduled Lasix.  Plan: We will continue home losartan.  Increase home Lopressor to 50 mg p.o. twice daily, as above.  Initiation of scheduled Lasix 20 milligrams p.o. daily, as above.  We will continue home HCTZ for now.    #) GERD: On omeprazole as an outpatient.  Plan: Continue home PPI.    DVT prophylaxis: Eliquis started this evening. Code Status: full Family Communication: Patient's case was discussed with her daughter, who is present at bedside. Disposition Plan: Per Rounding Team Consults called: Dr. Kalman Shan (on-call cardiologist at [facility]).   Admission status: Observation; med telemetry.    PLEASE NOTE THAT DRAGON DICTATION SOFTWARE WAS USED IN THE CONSTRUCTION OF THIS NOTE.   [facility] Pager [phone] From 3PM- 11PM.   Otherwise, please contact night-coverage  [email or web address] Password [facility]  12/03/2018, 9:35 PM

## 2018-12-04 ENCOUNTER — Observation Stay (HOSPITAL_BASED_OUTPATIENT_CLINIC_OR_DEPARTMENT_OTHER): Payer: Medicare Other

## 2018-12-04 DIAGNOSIS — I4819 Other persistent atrial fibrillation: Secondary | ICD-10-CM | POA: Diagnosis not present

## 2018-12-04 DIAGNOSIS — I509 Heart failure, unspecified: Secondary | ICD-10-CM | POA: Diagnosis not present

## 2018-12-04 DIAGNOSIS — I4891 Unspecified atrial fibrillation: Secondary | ICD-10-CM

## 2018-12-04 DIAGNOSIS — G4733 Obstructive sleep apnea (adult) (pediatric): Secondary | ICD-10-CM

## 2018-12-04 DIAGNOSIS — I872 Venous insufficiency (chronic) (peripheral): Secondary | ICD-10-CM

## 2018-12-04 DIAGNOSIS — I110 Hypertensive heart disease with heart failure: Secondary | ICD-10-CM

## 2018-12-04 DIAGNOSIS — I5031 Acute diastolic (congestive) heart failure: Secondary | ICD-10-CM | POA: Diagnosis not present

## 2018-12-04 DIAGNOSIS — I480 Paroxysmal atrial fibrillation: Secondary | ICD-10-CM | POA: Diagnosis not present

## 2018-12-04 DIAGNOSIS — K219 Gastro-esophageal reflux disease without esophagitis: Secondary | ICD-10-CM | POA: Diagnosis not present

## 2018-12-04 LAB — BASIC METABOLIC PANEL
Anion gap: 8 (ref 5–15)
BUN: 18 mg/dL (ref 8–23)
CO2: 28 mmol/L (ref 22–32)
Calcium: 9.3 mg/dL (ref 8.9–10.3)
Chloride: 104 mmol/L (ref 98–111)
Creatinine, Ser: 1.02 mg/dL — ABNORMAL HIGH (ref 0.44–1.00)
GFR calc Af Amer: >60 mL/min (ref >60)
GFR calc non Af Amer: 53 mL/min — ABNORMAL LOW (ref >60)
Glucose, Bld: 99 mg/dL (ref 70–99)
Potassium: 3.6 mmol/L (ref 3.5–5.1)
Sodium: 140 mmol/L (ref 135–145)

## 2018-12-04 LAB — URINALYSIS, ROUTINE W REFLEX MICROSCOPIC
Bilirubin Urine: NEGATIVE
Glucose, UA: NEGATIVE mg/dL
Hgb urine dipstick: NEGATIVE
Ketones, ur: NEGATIVE mg/dL
Leukocytes,Ua: NEGATIVE
Nitrite: NEGATIVE
Protein, ur: NEGATIVE mg/dL
Specific Gravity, Urine: <1.005 — ABNORMAL LOW (ref 1.005–1.030)
pH: 5.5 (ref 5.0–8.0)

## 2018-12-04 LAB — CBC
HCT: 39.6 % (ref 36.0–46.0)
Hemoglobin: 12 g/dL (ref 12.0–15.0)
MCH: 27.3 pg (ref 26.0–34.0)
MCHC: 30.3 g/dL (ref 30.0–36.0)
MCV: 90.2 fL (ref 80.0–100.0)
Platelets: 244 10*3/uL (ref 150–400)
RBC: 4.39 MIL/uL (ref 3.87–5.11)
RDW: 15.1 % (ref 11.5–15.5)
WBC: 8.2 10*3/uL (ref 4.0–10.5)
nRBC: 0 % (ref 0.0–0.2)

## 2018-12-04 LAB — TSH: TSH: 1.361 u[IU]/mL (ref 0.350–4.500)

## 2018-12-04 LAB — ECHOCARDIOGRAM COMPLETE
Height: 68 in
Weight: 4620.84 oz

## 2018-12-04 LAB — MAGNESIUM: Magnesium: 2 mg/dL (ref 1.7–2.4)

## 2018-12-04 MED ORDER — FUROSEMIDE 20 MG PO TABS: 20.0000 mg | ORAL_TABLET | Freq: Every day | ORAL | 1 refills | Status: DC

## 2018-12-04 MED ORDER — APIXABAN 5 MG PO TABS: 5.0000 mg | ORAL_TABLET | Freq: Two times a day (BID) | ORAL | 2 refills | Status: DC

## 2018-12-04 MED ORDER — METOPROLOL TARTRATE 50 MG PO TABS: 50.0000 mg | ORAL_TABLET | Freq: Two times a day (BID) | ORAL | 2 refills | Status: DC

## 2018-12-04 NOTE — Progress Notes (Signed)
*  PRELIMINARY RESULTS* Echocardiogram 2D Echocardiogram has been performed.  Jeryl Columbia 12/04/2018, 12:11 PM

## 2018-12-04 NOTE — Consult Note (Addendum)
Cardiology Consult    Patient ID: [name]; [id number]; Nov 10, 1940   Admit date: 12/03/2018 Date of Consult: 12/04/2018  Primary Care Provider: Valla Leaver, MD Primary Cardiologist: New to [facility] - Dr. Purvis Sheffield  Patient Profile    [name] is a 78 y.o. 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 Arlean Hopping.   History of Present Illness    [name] presented to [facility] ED on 12/03/2018 after being evaluated in Urgent Care and informed she was in atrial fibrillation. She had been treated for pneumonia for approximately 2 weeks ago and symptoms initially improved with the use of antibiotic therapy. However, approximately 1 week ago she developed recurrent dyspnea and a nonproductive cough.   Says that she has been experiencing worsening dyspnea on exertion and orthopnea for the past week. She has been unable to lay flat at night and has been utilizing her recliner. Denies any specific palpitations or chest pain. She has chronic lower extremity edema but says this has acutely worsened over the past week.  She was prescribed HCTZ 25 mg daily and Lasix 40 mg as needed as an outpatient but states she rarely utilizes Lasix due to frequent urination.  Initial labs show WBC 8.7, Hgb 12.7, platelets 278, Na+ 137, K+ 3.9, and creatinine 0.98. Initial troponin negative. BNP 187. D-dimer slightly elevated to 0.51.  TSH 1.361.  Magnesium 2.0.  CXR showed no active cardiopulmonary disease.  EKG showed rate controlled atrial fibrillation, heart rate 94.  At the time of admission, Eliquis 5 mg twice daily was initiated for anticoagulation and Lopressor was titrated from 25 mg twice daily to 50 mg twice daily. Has been in rate-controlled atrial fibrillation overnight and this morning with rates mostly in the  70's to 80's, peaking into the 110's with activity. Received PO Lasix  yesterday evening and reports significant urination with this. Recorded output of -730 mL yet she reports multiple episodes of urinary incontinence thus far.     Past Medical History:  Diagnosis Date  . Anemia   . Anxiety   . Arthritis   . Back pain    reason unknown  . Cataract    bilateral immature  . Diarrhea   . Diverticulosis   . GERD (gastroesophageal reflux disease)    takes Omeprazole daily  . Headache(784.0)    occasionally  . Hiatal hernia   . History of blood transfusion    no abnormal reaction noted  . History of bronchitis 2014  . History of colon polyps   . History of shingles   . Hypertension    takes Metoprolol,Amlodipine and Losartan daily  . Internal hemorrhoids   . Joint pain   . Joint swelling   . Lung nodule   . Nocturia   . Numbness    hands occasionally  . Osteoarthritis   . Peripheral edema    takes HCTZ daily and Furosemide daily as needed  . PONV (postoperative nausea and vomiting)   . Redness    to both lower extremities  . Shortness of breath    WITH EXERTION   . Sleep apnea    doesn't use cpap  . Urinary frequency   . Urinary urgency   . Venous insufficiency     Past Surgical History:  Procedure Laterality Date  . ABDOMINAL HYSTERECTOMY  40  . ANKLE SURGERY Left 1993  . ANTERIOR CRUCIATE LIGAMENT REPAIR Left 2013  . BREAST SURGERY     LEFT SIDE BIOPSY 1989  . CHOLECYSTECTOMY  1990  . COLONOSCOPY    . ENDOVENOUS ABLATION SAPHENOUS VEIN W/ LASER Right 04/17/2017   endovenous laser ablation R GSV by Josephina Gip MD  . ESOPHAGOGASTRODUODENOSCOPY    . HAND SURGERY Left 2011  . QUADRICEPS TENDON REPAIR  07/16/2012   Procedure: REPAIR QUADRICEP TENDON;  Surgeon: Raymon Mutton, MD;  Location: [facility] OR;  Service: Orthopedics;  Laterality: Left;  open repair of VMO   . quiadricp  10'21/2012  . 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   Procedure: TOTAL KNEE  ARTHROPLASTY;  Surgeon: Raymon Mutton, MD;  Location: [facility] OR;  Service: Orthopedics;  Laterality: Left;  . 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 TUBAL LIGATION  1974  . vasculitic eruption of legs  1985     Home Medications:  Prior to Admission medications   Medication Sig Start Date End Date Taking? Authorizing Provider  acetaminophen (TYLENOL) 500 MG tablet Take 500 mg by mouth every 6 (six) hours as needed. For pain   Yes [provider]  aspirin 81 MG tablet Take 81 mg by mouth daily.   Yes [provider]  Biotin 5000 MCG CAPS Take 1 capsule by mouth daily.   Yes [provider]  Chlorpheniramine-DM (CORICIDIN COUGH/COLD) 4-30 MG TABS Take 1-2 tablets by mouth daily as needed (for cough and clold).   Yes [provider]  Cholecalciferol (VITAMIN D3) 10000 units capsule Take 10,000 Units by mouth daily.   Yes [provider]  [employer] FLUTICASONE PROPIONATE 50 MCG/ACT nasal spray Place 2 sprays into both nostrils daily as needed for allergies or rhinitis.  11/23/18  Yes [provider]  furosemide (LASIX) 40 MG tablet Take 40 mg by mouth daily as needed for fluid. Water gain    Yes [provider]  Glucosamine-Chondroitin (GLUCOSAMINE CHONDR COMPLEX PO) Take 1 capsule by mouth daily.    Yes [provider]  guaiFENesin (MUCINEX) 600 MG 12 hr tablet Take 600 mg by mouth 2 (two) times daily as needed for cough or to loosen phlegm.   Yes [provider]  hydrochlorothiazide (HYDRODIURIL) 25 MG tablet Take 25 mg by mouth daily.   Yes [provider]  ibuprofen (ADVIL,MOTRIN) 200 MG tablet Take 200 mg by mouth every 6 (six) hours as needed for mild pain or moderate pain.   Yes [provider]  loratadine (CLARITIN)  10 MG tablet Take 10 mg by mouth daily as needed for allergies.   Yes [provider]  losartan (COZAAR) 100 MG tablet Take 100 mg by mouth daily.   Yes [provider]  metoprolol tartrate (LOPRESSOR) 25 MG tablet Take 25 mg by mouth 2 (two) times daily.    Yes [provider]  Omega-3 Fatty Acids (OMEGA-3 FISH OIL PO) Take 1 capsule by mouth daily.   Yes [provider]  omeprazole (PRILOSEC) 20 MG capsule Take 20 mg by mouth daily.   Yes [provider]  potassium gluconate 595 MG TABS tablet Take 1,190 mg by mouth daily.    Yes [provider]  triamcinolone cream (KENALOG) 0.1 % Apply 1 application topically  3 (three) times daily.   Yes [provider]  triamcinolone lotion (KENALOG) 0.1 % Apply 1 application topically 3 (three) times daily.   Yes [provider]  VENTOLIN HFA 108 (90 Base) MCG/ACT inhaler Inhale 2 puffs into the lungs every 4 (four) hours as needed for wheezing or shortness of breath.  11/22/18  Yes [provider]  vitamin B-12 (CYANOCOBALAMIN) 1000 MCG tablet Take 1,000 mcg by mouth daily.   Yes [provider]  cefUROXime (CEFTIN) 500 MG tablet Take 500 mg by mouth 2 (two) times daily with a meal. 10 day course completed on 12/02/2018 11/22/18   [provider]  doxycycline (VIBRA-TABS) 100 MG tablet Take 100 mg by mouth 2 (two) times daily. for 10 days 11/22/18   [provider]    Inpatient Medications: Scheduled Meds: . apixaban  5 mg Oral BID  . furosemide  20 mg Oral Daily  . hydrochlorothiazide  25 mg Oral Daily  . losartan  100 mg Oral Daily  . metoprolol tartrate  50 mg Oral BID  . pantoprazole  40 mg Oral Daily   Continuous Infusions:  PRN Meds: acetaminophen **OR** acetaminophen  Allergies:    Allergies  Allergen Reactions  . Sulfa Antibiotics     Reaction unknown  . Adhesive [Tape] Other (See Comments) and Rash    Skin is very sensitive Skin  is very sensitive Skin is very sensitive    Social History:   Social History   Socioeconomic History  . Marital status: Single    Spouse name: Not on file  . Number of children: 3  . Years of education: 50  . Highest education level: Not on file  Occupational History  . Occupation: retired  Engineer, production  . Financial resource strain: Not on file  . Food insecurity:    Worry: Not on file    Inability: Not on file  . Transportation needs:    Medical: Not on file    Non-medical: Not on file  Tobacco Use  . Smoking status: Never Smoker  . Smokeless tobacco: Never Used  Substance and Sexual Activity  . Alcohol use: No    Alcohol/week: 0.0 standard drinks  . Drug use: No  . Sexual activity: Never    Birth control/protection: Surgical  Lifestyle  . Physical activity:    Days per week: Not on file    Minutes per session: Not on file  . Stress: Not on file  Relationships  . Social connections:    Talks on phone: Not on file    Gets together: Not on file    Attends religious service: Not on file    Active member of club or organization: Not on file    Attends meetings of clubs or organizations: Not on file    Relationship status: Not on file  . Intimate partner violence:    Fear of current or ex partner: Not on file    Emotionally abused: Not on file    Physically abused: Not on file    Forced sexual activity: Not on file  Other Topics Concern  . Not on file  Social History Narrative   Fun: Exercise class twice per week.    Denies abuse and feels safe at home.      Family History:    Family History  Problem Relation Age of Onset  . Lymphoma Father   . Leukemia Father   . Heart attack Father   . Hypertension Mother   .  Emphysema Mother   . Heart disease Sister   . Rashes / Skin problems Sister   . Heart failure Sister   . Kidney disease Sister   . Heart disease Maternal Grandfather   . Heart disease Paternal Grandmother   . Heart disease Paternal  Grandfather       Review of Systems    General:  No chills, fever, night sweats or weight changes.  Cardiovascular:  No chest pain, palpitations, paroxysmal nocturnal dyspnea. Positive for orthopnea, edema, and dyspnea on exertion.  Dermatological: No rash, lesions/masses Respiratory: Positive for dry cough and dyspnea. Urologic: No hematuria, dysuria Abdominal:   No nausea, vomiting, diarrhea, bright red blood per rectum, melena, or hematemesis Neurologic:  No visual changes, wkns, changes in mental status. All other systems reviewed and are otherwise negative except as noted above.  Physical Exam/Data    Vitals:   12/03/18 1930 12/03/18 2042 12/03/18 2238 12/04/18 0524  BP: (!) 166/74 (!) 168/80 (!) 149/81 (!) 158/83  Pulse: 88 91 84 81  Resp: (!) 27 (!) 22 16 16   Temp:  98 F (36.7 C) 97.8 F (36.6 C) 97.6 F (36.4 C)  TempSrc:  Oral  Oral  SpO2: 97% 98% 96% 97%  Weight:    131 kg  Height:        Intake/Output Summary (Last 24 hours) at 12/04/2018 0743 Last data filed at 12/04/2018 0254 Gross per 24 hour  Intake 120 ml  Output 850 ml  Net -730 ml   Filed Weights   12/03/18 1655 12/04/18 0524  Weight: 131.5 kg 131 kg   Body mass index is 43.91 kg/m.   General: Pleasant, overweight Caucasian female appearing in NAD Psych: Normal affect. Neuro: Alert and oriented X 3. Moves all extremities spontaneously. HEENT: Normal  Neck: Supple without bruits or JVD. Lungs:  Resp regular and unlabored, decreased along bases bilaterally. No wheezing or rales. Heart: Irregularly irregular. no s3, s4, or murmurs. Abdomen: Soft, non-tender, non-distended, BS + x 4.  Extremities: No clubbing or cyanosis. 1+ pitting edema bilaterally. Chronic erythema bilaterally. DP/PT/Radials 2+ and equal bilaterally.   Labs/Studies     Relevant CV Studies:  Echocardiogram: Pending  Laboratory Data:  Chemistry Recent Labs  Lab 12/03/18 1913 12/04/18 0507  NA 137 140  K 3.9 3.6    CL 102 104  CO2 25 28  GLUCOSE 102* 99  BUN 19 18  CREATININE 0.98 1.02*  CALCIUM 9.4 9.3  GFRNONAA 55* 53*  GFRAA >60 >60  ANIONGAP 10 8    No results for input(s): PROT, ALBUMIN, AST, ALT, ALKPHOS, BILITOT in the last 168 hours. Hematology Recent Labs  Lab 12/03/18 1914 12/04/18 0507  WBC 8.7 8.2  RBC 4.60 4.39  HGB 12.7 12.0  HCT 41.8 39.6  MCV 90.9 90.2  MCH 27.6 27.3  MCHC 30.4 30.3  RDW 15.1 15.1  PLT 278 244   Cardiac Enzymes Recent Labs  Lab 12/03/18 1913  TROPONINI <0.03   No results for input(s): TROPIPOC in the last 168 hours.  BNP Recent Labs  Lab 12/03/18 1913  BNP 187.0*    DDimer  Recent Labs  Lab 12/03/18 1913  DDIMER 0.51*    Radiology/Studies:  Dg Chest 2 View  Result Date: 12/03/2018 CLINICAL DATA:  Shortness of breath EXAM: CHEST - 2 VIEW COMPARISON:  09/28/2017 FINDINGS: The heart size and mediastinal contours are within normal limits. Both lungs are clear. The visualized skeletal structures are unremarkable. IMPRESSION: No active cardiopulmonary disease. Electronically  Signed   By: Deatra Robinson M.D.   On: 12/03/2018 20:18     Assessment & Plan    1. New-Onset Atrial Fibrillation - new diagnosis for the patient although she reports a history of an irregular pulse in the past but was never diagnosed with an official arrhythmia. K+ and TSH WNL. Mg initially low at 1.4 and replaced (at 2.0 this AM).  Echo pending. Her atrial fibrillation is possibly triggered by her recent PNA. Notes from the ED mention she was in NSR for a short duration but by review of telemetry, she has been in rate-controlled atrial fibrillation overnight and this morning. Will recheck 12-Lead EKG as she does have artifact at times.  - agree with titration of Lopressor from  BID to  BID for improved rate-control. Will ask for ambulatory O2 saturations and HR to be checked later this morning. - This patients CHA2DS2-VASc Score and unadjusted Ischemic Stroke Rate  (% per year) is equal to 7.2 % stroke rate/year from a score of 5 (HTN, Aortic Plaque, Female, Age (2)). She has been started on Eliquis  BID for anticoagulation. Reviewed with the patient and her daughter today that she might spontaneously convert back to NSR once further out from her acute illness. If she does not convert back to NSR, an outpatient DCCV could be considered once she has been on uninterrupted anticoagulation for 3 weeks.   2. Acute CHF Exacerbation (Echo pending to determine Systolic versus Diastolic Dysfunction) - she reports progressive dyspnea on exertion and orthopnea over the past week. BNP elevated to 187 on admission but CXR showed no active cardiopulmonary disease. Echocardiogram is pending to assess LV function and wall motion. - she has been started on PO Lasix  daily and reports significant urination with this. She wishes to avoid IV Lasix due to frequent urination and given the improvement in her symptoms, the use of PO Lasix seems reasonable at this time. Would continue at current dosing for now. Would need a repeat BMET in 1-2 weeks for reassessment of kidney function and electrolytes.   3. HTN - BP has been variable at 149/74 - 197/92 since admission. Improved to 158/83 on most recent check. She was on HCTZ  daily, Losartan  daily, and Lopressor  BID as an outpatient. Would stop HCTZ given the initiation of daily Lasix in order to avoid dual diuretic therapy. Lopressor has been titrated to  BID for additional rate-control.   4. OSA - noncompliant with CPAP as an outpatient. Would likely benefit from a repeat sleep study given her new diagnosis of atrial fibrillation.   For questions or updates, please contact [facility] Please consult [email or web address] for contact info under Cardiology/STEMI.  Signed, Ellsworth Lennox, PA-C 12/04/2018, 7:43 AM Pager: [phone]  The patient was seen and examined, and I agree with the history, physical  exam, assessment and plan as documented above, with modifications as noted below. I have also personally reviewed all relevant documentation, old records, labs, and both radiographic and cardiovascular studies. I have also independently interpreted old and new ECG's.  Her daughter, Bonita Quin, was present in the room at the time of my evaluation.  Briefly, this is a 78 year old woman with a history of hypertension and untreated sleep apnea who was recently treated for pneumonia.  Her symptoms gradually improved until she was unable to lie flat over the past week.  She has a occasional upper left-sided chest twinges going on for the past month but she did not think  this was related to her heart.  She denies palpitations.  She was found to be in new onset rate controlled atrial fibrillation.  She was started on apixaban 5 mg twice daily for systemic anticoagulation and Lopressor was increased from 25 mg twice daily to 50 mg twice daily.  She was also given oral Lasix and is on 20 mg daily.  She has put out 730 cc thus far.  Overall she feels improved.  An echocardiogram has been ordered and is pending.  Other than hypomagnesemia, other labs were unremarkable including TSH.  I reviewed the ECG which shows rate controlled atrial fibrillation.  I explained to to the patient and her daughter about the natural history of atrial fibrillation and risk factors associated with this.  I also explained to her the importance of treatment for sleep apnea as this is not a risk factor for atrial fibrillation but other cardiac arrhythmias as well as MI and stroke.  She said she had a sleep study several years ago.  She will need an outpatient sleep study.  Further medication adjustments pending echocardiogram review.   Prentice Docker, MD, [facility]  12/04/2018 9:59 AM

## 2018-12-04 NOTE — Progress Notes (Signed)
Pt's IV catheter removed and intact. Pt's IV site clean dry and intact. Discharge instructions including medications and follow up appointments were reviewed and discussed with patient. All questions were answered and no further questions at this time. Pt in stable condition and in no acute distress at time of discharge. Pt escorted by nurse tech 

## 2018-12-04 NOTE — Discharge Instructions (Addendum)
1)You are taking apixaban/Eliquis so avoid ibuprofen/Advil/Aleve/Motrin/Goody Powders/Naproxen/BC powders/Meloxicam/Diclofenac/Indomethacin and other Nonsteroidal anti-inflammatory medications as these will make you more likely to bleed and can cause stomach ulcers, can also cause Kidney problems.   2)Take Eliquis/Apixaban due to atrial fibrillation to reduce your risk for strokes  3) increase metoprolol to 50 mg twice a day to help control your heart rate better due to atrial fibrillation  4) take furosemide/Lasix 20 mg daily for fluid, stop HCTZ/hydrochlorthiazide  5)Please avoid decongestants including Sudafed as this will make your heart rate much higher  6)Okay to use Guaifenesin/Mucinex for cough and congestion, may also use cetirizine/Zyrtec or Loratadine/Claritin without "D"  for seasonal allergies (without decongestants)  7) outpatient sleep study strongly advised for possible sleep apnea diagnosis  8) follow-up with Dr. Purvis Sheffield cardiologist in 2 to 3 weeks for recheck   Information on my medicine - ELIQUIS (apixaban)  This medication education was reviewed with me or my healthcare representative as part of my discharge preparation.   Why was Eliquis prescribed for you? Eliquis was prescribed for you to reduce the risk of a blood clot forming that can cause a stroke if you have a medical condition called atrial fibrillation (a type of irregular heartbeat).  What do You need to know about Eliquis ? Take your Eliquis TWICE DAILY - one tablet in the morning and one tablet in the evening with or without food. If you have difficulty swallowing the tablet whole please discuss with your pharmacist how to take the medication safely.  Take Eliquis exactly as prescribed by your doctor and DO NOT stop taking Eliquis without talking to the doctor who prescribed the medication.  Stopping may increase your risk of developing a stroke.  Refill your prescription before you run  out.  After discharge, you should have regular check-up appointments with your healthcare provider that is prescribing your Eliquis.  In the future your dose may need to be changed if your kidney function or weight changes by a significant amount or as you get older.  What do you do if you miss a dose? If you miss a dose, take it as soon as you remember on the same day and resume taking twice daily.  Do not take more than one dose of ELIQUIS at the same time to make up a missed dose.  Important Safety Information A possible side effect of Eliquis is bleeding. You should call your healthcare provider right away if you experience any of the following: ? Bleeding from an injury or your nose that does not stop. ? Unusual colored urine (red or dark brown) or unusual colored stools (red or black). ? Unusual bruising for unknown reasons. ? A serious fall or if you hit your head (even if there is no bleeding).  Some medicines may interact with Eliquis and might increase your risk of bleeding or clotting while on Eliquis. To help avoid this, consult your healthcare provider or pharmacist prior to using any new prescription or non-prescription medications, including herbals, vitamins, non-steroidal anti-inflammatory drugs (NSAIDs) and supplements.  This website has more information on Eliquis (apixaban): http://www.eliquis.com/eliquis/home   1)You are taking apixaban/Eliquis so avoid ibuprofen/Advil/Aleve/Motrin/Goody Powders/Naproxen/BC powders/Meloxicam/Diclofenac/Indomethacin and other Nonsteroidal anti-inflammatory medications as these will make you more likely to bleed and can cause stomach ulcers, can also cause Kidney problems.   2)Take Eliquis/Apixaban due to atrial fibrillation to reduce your risk for strokes  3) increase metoprolol to 50 mg twice a day to help control your heart rate better due  to atrial fibrillation  4) take furosemide/Lasix 20 mg daily for fluid, stop  HCTZ/hydrochlorthiazide  5)Please avoid decongestants including Sudafed as this will make your heart rate much higher  6)Okay to use Guaifenesin/Mucinex for cough and congestion, may also use cetirizine/Zyrtec or Loratadine/Claritin without "D"  for seasonal allergies (without decongestants)  7) outpatient sleep study strongly advised for possible sleep apnea diagnosis  8) follow-up with Dr. Purvis Sheffield cardiologist in 2 to 3 weeks for recheck

## 2018-12-04 NOTE — Care Management Obs Status (Signed)
MEDICARE OBSERVATION STATUS NOTIFICATION   Patient Details  Name: Abigayl Gongora MRN: [id number] Date of Birth: Oct 12, 1940   Medicare Observation Status Notification Given:  Yes    Annice Needy, LCSW 12/04/2018, 12:10 PM

## 2018-12-04 NOTE — Discharge Summary (Signed)
[name], is a 78 y.o. female  DOB 1941-07-09  MRN [id number].  Admission date:  12/03/2018  Admitting Physician  Angie Fava, DO  Discharge Date:  12/04/2018   Primary MD  Valla Leaver, MD  Recommendations for primary care physician for things to follow:   1)You are taking apixaban/Eliquis so avoid ibuprofen/Advil/Aleve/Motrin/Goody Powders/Naproxen/BC powders/Meloxicam/Diclofenac/Indomethacin and other Nonsteroidal anti-inflammatory medications as these will make you more likely to bleed and can cause stomach ulcers, can also cause Kidney problems.   2)Take Eliquis/Apixaban due to atrial fibrillation to reduce your risk for strokes  3) increase metoprolol to 50 mg twice a day to help control your heart rate better due to atrial fibrillation  4) take furosemide/Lasix 20 mg daily for fluid, stop HCTZ/hydrochlorthiazide  5)Please avoid decongestants including Sudafed as this will make your heart rate much higher  6)Okay to use Guaifenesin/Mucinex for cough and congestion, may also use cetirizine/Zyrtec or Loratadine/Claritin without "D"  for seasonal allergies (without decongestants)  7) outpatient sleep study strongly advised for possible sleep apnea diagnosis  8) follow-up with Dr. Purvis Sheffield cardiologist in 2 to 3 weeks for recheck   Admission Diagnosis  Persistent atrial fibrillation [I48.19]   Discharge Diagnosis  Persistent atrial fibrillation [I48.19]   Principal Problem:   AF (paroxysmal atrial fibrillation) (HCC) Active Problems:   SOB (shortness of breath)   Chronic venous insufficiency   Essential hypertension   GERD (gastroesophageal reflux disease)   Acute diastolic heart failure (HCC)   Hypomagnesemia      Past Medical History:  Diagnosis Date  . Anemia   . Anxiety   . Arthritis   . Back pain    reason unknown  . Cataract    bilateral immature  .  Diarrhea   . Diverticulosis   . GERD (gastroesophageal reflux disease)    takes Omeprazole daily  . Headache(784.0)    occasionally  . Hiatal hernia   . History of blood transfusion    no abnormal reaction noted  . History of bronchitis 2014  . History of colon polyps   . History of shingles   . Hypertension    takes Metoprolol,Amlodipine and Losartan daily  . Internal hemorrhoids   . Joint pain   . Joint swelling   . Lung nodule   . Nocturia   . Numbness    hands occasionally  . Osteoarthritis   . Peripheral edema    takes HCTZ daily and Furosemide daily as needed  . PONV (postoperative nausea and vomiting)   . Redness    to both lower extremities  . Shortness of breath    WITH EXERTION   . Sleep apnea    doesn't use cpap  . Urinary frequency   . Urinary urgency   . Venous insufficiency     Past Surgical History:  Procedure Laterality Date  . ABDOMINAL HYSTERECTOMY  1983  . ANKLE SURGERY Left 1993  . ANTERIOR CRUCIATE LIGAMENT REPAIR Left 2013  . BREAST SURGERY     LEFT SIDE BIOPSY  1989  . CHOLECYSTECTOMY  1990  . COLONOSCOPY    . ENDOVENOUS ABLATION SAPHENOUS VEIN W/ LASER Right 04/17/2017   endovenous laser ablation R GSV by Josephina Gip MD  . ESOPHAGOGASTRODUODENOSCOPY    . HAND SURGERY Left 2011  . QUADRICEPS TENDON REPAIR  07/16/2012   Procedure: REPAIR QUADRICEP TENDON;  Surgeon: Raymon Mutton, MD;  Location[facility] OR;  Service: Orthopedics;  Laterality: Left;  open repair of VMO   . quiadricp  10'21/2012  . 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 TUBAL LIGATION  1974  . vasculitic eruption of legs   1985       HPI  from the history and physical done on the day of admission:    Chief Complaint: shortness of breath  HPI: [name] is a 78 y.o. female with medical history significant for chronic peripheral edema in the setting of chronic venous insufficiency, obstructive sleep apnea not compliant with home CPAP, hypertension, who is admitted to [facility] on 12/03/2018 with new diagnosis of atrial fibrillation of presenting from home to [facility] ED complaining of shortness of breath.   10 to 14 days ago, the patient reports that she developed shortness of breath associated with a productive cough prompting her to present to urgent care at which time she was diagnosed with pneumonia for which she was prescribed and completed 10-day courses of cefuroxime and doxycycline, with final doses of both occurring on 12/02/2018.  However, approximately 1 week ago, the patient's cough became nonproductive, and the nature of her shortness of breath changed as well.  Specifically, she reports shortness of breath with exertion over that time, as well as development of orthopnea, PND, and worsening of her chronic edema in the bilateral lower extremities.  She also reports weight gain of a few pounds over that timeframe in spite of not eating as much over that time.  In the setting of ongoing shortness of breath in spite of good compliance and completing 10-day courses of the aforementioned two and a biotics, the patient presented back to urgent care earlier today, at which time she was noted to be in atrial fibrillation, with ventricular rates in the range of 110 bpm.  As she was felt to be symptomatic in the setting of a new diagnosis of atrial fibrillation, she was recommended by urgent care to present to the emergency department for further evaluation and management of new diagnosis of atrial fibrillation.  The patient denies any recent subjective fever, chills, rigors, dysuria, or gross hematuria.   Denies any recent nausea or vomiting.  She reports 1-2 daily episodes of loose stool over the last 2 to 3 days while on the above antibiotics, but denies any associated melena or hematochezia.  Denies any recent chest pain, palpitations, diaphoresis, dizziness, presyncope, or syncope.  No recent trauma or hemoptysis.   Past medical history notable for history of obstructive sleep apnea, for which the patient conveys that she does not use home CPAP.  She acknowledges a history of hypertension, for which she is on Lopressor 25 mg p.o. twice daily.  She emphasizes that this beta-blocker was prescribed  purely for antihypertensive purposes, and denies any prior cardiac diagnoses, including no history of atrial fibrillation or coronary artery disease.  In the setting of neck peripheral edema due to chronic venous insufficiency status post ablation of the great saphenous vein, the patient is prescribed Lasix 60 mg p.o. daily as needed for increase in peripheral edema on a subjective basis.  She states that she has not recently taken her PRN Lasix.  She reports that she takes a daily baby aspirin, but otherwise is not on any formal anticoagulation. Denies any history of gastrointestinal bleed.    ED Course: Vital signs in the emergency department: Temperature max 98.1, ventricular rate 83-95, initial blood pressure noted to be 180/74, which decreased to 149/81 following administration of Lopressor and Lasix, as further described below; respiratory rate 22-30; and oxygen saturation 96 to 98% on room air.  Labs in the ED were notable for the following: BMP notable for sodium 137, potassium 3.9, bicarbonate 25, and creatinine 0.98.  BNP 187, without any prior data point available for point comparison.  CBC was notable for white blood cell count of 8700 with hemoglobin 12.7.  Troponin I x 1 was negative.  D-dimer 0.51.   2 view chest x-ray, per final radiology report showed no evidence of acute cardiopulmonary  process, including no evidence of infiltrate, edema, or effusion.  EKG performed in the ED this evening showed atrial fibrillation with ventricular rate of 90, normal axis, potential Q waves in V1 and V2, which were also noted on EKG from January 2019, and no evidence of interval T wave or ST changes.  The emergency department physician discussed the patient's case, including EKG findings, with the on-call cardiologist at [facility], Dr. Okey Dupre, who felt that if the patient was still symptomatic from her atrial fibrillation that overnight observation was reasonable for rate control optimization, diuresis, initiation of anti-coagulation, echocardiogram, and formal cardiology consult on a prn basis.  In the setting of ongoing shortness of breath, the patient was admitted for overnight observation for further evaluation and management of presenting new diagnosis of atrial fibrillation  While in the ED, the following were administered: Eliquis 5 mg p.o. x1, Lasix 20 mg p.o. x1, and Lopressor 50 mg p.o. [facility] Course:    Brief Summary 78 y.o. 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) and GERD  Admitted on 12/03/2018 with new onset atrial fibrillation with RVR  Plan:- 1)New Onset AFib---- Cardiology consult appreciated, cardiologist recommends increasing metoprolol to 50 mg twice daily, echo with preserved EF of 55 to 60%, patient had moderate to severe left atrial dilatation, and mild right atrial dilatation ., TSH 1.3, Mag 2.0, CHA2DS2-VASc Score and unadjusted Ischemic Stroke Rate (% per year) is equal to 7.2 % stroke rate/year from a score of 5 (HTN, Aortic Plaque, Female, Age (2)----risk versus benefit of anticoagulation discussed with patient and daughter, patient would like to continue Eliquis 5 mg twice daily, she will avoid NSAIDs  2)HFpEF--- acute diastolic dysfunction CHF exacerbation in the setting of A. fib with RVR,,  echocardiogram with EF in the 55 to 60% range as noted above, cardiology consult appreciated, stop HCTZ, treat empirically with Lasix 20 mg daily.  Patient with chronic lower extremity edema and venous stasis, as per patient and daughter at this is unchanged from baseline, please see photos in epic.  No hypoxia with ambulation or post ambulation  3)Morbid obesity with possible OSA---.Marland Kitchen Previously  not compliant with CPAP, advised to get repeat sleep study and CPAP titration  4)HTN--- elevated BP noted, metoprolol increased to 50 mg twice daily for rate control of A. fib as above #1, continue losartan 100 mg daily, okay to stop HCTZ, instead use Lasix 20 mg daily as prescribed,  5)GERD--- stable, continue PPI  Discharge Condition: stable  Follow UP--- cardiologist in 2 to 3 weeks   Consults obtained - cardiology  Diet and Activity recommendation:  As advised  Discharge Instructions    Discharge Instructions    Call MD for:  difficulty breathing, headache or visual disturbances   Complete by:  As directed    Call MD for:  persistant dizziness or light-headedness   Complete by:  As directed    Call MD for:  persistant nausea and vomiting   Complete by:  As directed    Call MD for:  severe uncontrolled pain   Complete by:  As directed    Call MD for:  temperature >100.4   Complete by:  As directed    Diet - low sodium heart healthy   Complete by:  As directed    Discharge instructions   Complete by:  As directed    1)You are taking apixaban/Eliquis so avoid ibuprofen/Advil/Aleve/Motrin/Goody Powders/Naproxen/BC powders/Meloxicam/Diclofenac/Indomethacin and other Nonsteroidal anti-inflammatory medications as these will make you more likely to bleed and can cause stomach ulcers, can also cause Kidney problems.   2)Take Eliquis/Apixaban due to atrial fibrillation to reduce your risk for strokes  3) increase metoprolol to 50 mg twice a day to help control your heart rate better due to  atrial fibrillation  4) take furosemide/Lasix 20 mg daily for fluid, stop HCTZ/hydrochlorthiazide  5)Please avoid decongestants including Sudafed as this will make your heart rate much higher  6)Okay to use Guaifenesin/Mucinex for cough and congestion, may also use cetirizine/Zyrtec or Loratadine/Claritin without "D"  for seasonal allergies (without decongestants)  7) outpatient sleep study strongly advised for possible sleep apnea diagnosis  8) follow-up with Dr. Purvis Sheffield cardiologist in 2 to 3 weeks for recheck   Increase activity slowly   Complete by:  As directed        Discharge Medications     Allergies as of 12/04/2018      Reactions   Sulfa Antibiotics    Reaction unknown   Adhesive [tape] Other (See Comments), Rash   Skin is very sensitive Skin is very sensitive Skin is very sensitive      Medication List    STOP taking these medications   aspirin 81 MG tablet   cefUROXime 500 MG tablet Commonly known as:  CEFTIN   CORICIDIN COUGH/COLD 4-30 MG Tabs Generic drug:  Chlorpheniramine-DM   doxycycline 100 MG tablet Commonly known as:  VIBRA-TABS   hydrochlorothiazide 25 MG tablet Commonly known as:  HYDRODIURIL   ibuprofen 200 MG tablet Commonly known as:  ADVIL,MOTRIN     TAKE these medications   acetaminophen 500 MG tablet Commonly known as:  TYLENOL Take 500 mg by mouth every 6 (six) hours as needed. For pain   apixaban 5 MG Tabs tablet Commonly known as:  ELIQUIS Take 1 tablet (5 mg total) by mouth 2 (two) times daily.   Biotin 5000 MCG Caps Take 1 capsule by mouth daily.   [employer] Fluticasone Propionate 50 MCG/ACT nasal spray Generic drug:  fluticasone Place 2 sprays into both nostrils daily as needed for allergies or rhinitis.   furosemide 20 MG tablet Commonly known as:  LASIX  Take 1 tablet (20 mg total) by mouth daily. Start taking on:  December 05, 2018 What changed:    medication strength  how much to take  when to take  this  reasons to take this  additional instructions   GLUCOSAMINE CHONDR COMPLEX PO Take 1 capsule by mouth daily.   guaiFENesin 600 MG 12 hr tablet Commonly known as:  MUCINEX Take 600 mg by mouth 2 (two) times daily as needed for cough or to loosen phlegm.   loratadine 10 MG tablet Commonly known as:  CLARITIN Take 10 mg by mouth daily as needed for allergies.   losartan 100 MG tablet Commonly known as:  COZAAR Take 100 mg by mouth daily.   metoprolol tartrate 50 MG tablet Commonly known as:  LOPRESSOR Take 1 tablet (50 mg total) by mouth 2 (two) times daily. What changed:    medication strength  how much to take   OMEGA-3 FISH OIL PO Take 1 capsule by mouth daily.   omeprazole 20 MG capsule Commonly known as:  PRILOSEC Take 20 mg by mouth daily.   potassium gluconate 595 (99 K) MG Tabs tablet Take 1,190 mg by mouth daily.   triamcinolone lotion 0.1 % Commonly known as:  KENALOG Apply 1 application topically 3 (three) times daily. What changed:  Another medication with the same name was removed. Continue taking this medication, and follow the directions you see here.   Ventolin HFA 108 (90 Base) MCG/ACT inhaler Generic drug:  albuterol Inhale 2 puffs into the lungs every 4 (four) hours as needed for wheezing or shortness of breath.   vitamin B-12 1000 MCG tablet Commonly known as:  CYANOCOBALAMIN Take 1,000 mcg by mouth daily.   Vitamin D3 250 MCG (10000 UT) capsule Take 10,000 Units by mouth daily.       Major procedures and Radiology Reports - PLEASE review detailed and final reports for all details, in brief -   Dg Chest 2 View  Result Date: 12/03/2018 CLINICAL DATA:  Shortness of breath EXAM: CHEST - 2 VIEW COMPARISON:  09/28/2017 FINDINGS: The heart size and mediastinal contours are within normal limits. Both lungs are clear. The visualized skeletal structures are unremarkable. IMPRESSION: No active cardiopulmonary disease. Electronically Signed    By: Deatra Robinson M.D.   On: 12/03/2018 20:18    Today   Subjective    Jodel Casillas today has no new complaints, No fever  Or chills , nausea, vomiting, diarrhea , no chest pain          Patient has been seen and examined prior to discharge   Objective   Blood pressure 132/62, pulse 83, temperature 98 F (36.7 C), temperature source Oral, resp. rate 16, height  (1.727 m), weight 131 kg, last menstrual period 02/07/2012, SpO2 95 %.   Intake/Output Summary (Last 24 hours) at 12/04/2018 1651 Last data filed at 12/04/2018 0900 Gross per 24 hour  Intake 360 ml  Output 850 ml  Net -490 ml    Exam Gen:- Awake Alert, no acute distress , patient in complete sentences HEENT:- [place].AT, No sclera icterus Neck-Supple Neck,No JVD,.  Lungs-  CTAB , good air movement bilaterally  CV- S1, S2 normal, irregularly irregular heart rate around 80 Abd-  +ve B.Sounds, Abd Soft, No tenderness,    Extremity/Skin:-  good pulses, chronic venous stasis dermatitis and edema--please see photos in epic and below Psych-affect is appropriate, oriented x3 Neuro-no new focal deficits, no tremors  Media Information   Document  Information   Photos    12/04/2018 10:20  Attached To:  Hospital Encounter on 12/03/18  Source Information   Shon Hale, MD  [facility] [facility]   Media Information   Document Information   Photos    12/04/2018 10:21  Attached To:  Hospital Encounter on 12/03/18  Source Information   Shon Hale, MD  [facility] [address]    Media Information   Document Information   Photos    12/04/2018 10:21  Attached To:  Hospital Encounter on 12/03/18  Source Information   Shon Hale, MD  [facility] [address]      Data Review   CBC w Diff:  Lab Results  Component Value Date   WBC 8.2 12/04/2018   HGB 12.0 12/04/2018   HCT 39.6 12/04/2018   PLT 244 12/04/2018   LYMPHOPCT 21 10/22/2013   MONOPCT 8 10/22/2013   EOSPCT 2 10/22/2013   BASOPCT 0 10/22/2013    CMP:   Lab Results  Component Value Date   NA 140 12/04/2018   K 3.6 12/04/2018   CL 104 12/04/2018   CO2 28 12/04/2018   BUN 18 12/04/2018   CREATININE 1.02 (H) 12/04/2018   PROT 7.3 09/29/2016   ALBUMIN 4.0 09/29/2016   BILITOT 1.0 09/29/2016   ALKPHOS 59 09/29/2016   AST 10 09/29/2016   ALT 11 09/29/2016  . Patient with chronic lower extremity edema and venous stasis, as per patient and daughter at this is unchanged from baseline, please see photos in epic.  No hypoxia with ambulation or post ambulation  Total Discharge time is about 33 minutes  Shon Hale M.D on 12/04/2018 at 4:51 PM  Go to [email or web address] -  for contact info  [facility] - Office  [phone]

## 2018-12-24 ENCOUNTER — Telehealth: Payer: Self-pay | Admitting: *Deleted

## 2018-12-24 NOTE — Telephone Encounter (Signed)
Cardiac Questionnaire:    Since your last visit or hospitalization:    1. Have you been having new or worsening chest pain? No   2. Have you been having new or worsening shortness of breath? No 3. Have you been having new or worsening leg swelling, wt gain, or increase in abdominal girth (pants fitting more tightly)? No   4. Have you had any passing out spells? No     *A YES to any of these questions would result in the appointment being kept. *If all the answers to these questions are NO, we should indicate that given the current situation regarding the worldwide coronarvirus pandemic, at the recommendation of the CDC, we are looking to limit gatherings in our waiting area, and thus will reschedule their appointment beyond four weeks from today.   _____________   Pt scheduled for Televisit.    [name] has been deemed a candidate for a follow-up tele-health visit to limit community exposure during the Covid-19 pandemic. I spoke with the patient via phone to ensure availability of phone/video source, confirm preferred email & phone number, and discuss instructions and expectations.  I reminded [name] to be prepared with any vital sign and/or heart rhythm information that could potentially be obtained via home monitoring, at the time of her visit. I reminded [name] to expect a phone call at the time of her visit if her visit.  Did the patient verbally acknowledge consent to treatment? Yes   Tabitha Massed, LPN 3/36/1224 49:75 AM   DOWNLOADING THE WEBEX SOFTWARE TO SMARTPHONE  - If Apple, go to [employer] and type in WebEx in the search bar. Download [employer], the blue/green circle. The app is free but as with any other app downloads, their phone may require them to verify saved payment information or Apple password. The patient does NOT have to create an account.  - If Android, ask patient to go to [employer] and type in WebEx in the search bar.  Download [employer], the blue/green circle. The app is free but as with any other app downloads, their phone may require them to verify saved payment information or Android password. The patient does NOT have to create an account.   CONSENT FOR TELE-HEALTH VISIT - PLEASE REVIEW  I hereby voluntarily request, consent and authorize [facility] and its employed or contracted physicians, physician assistants, nurse practitioners or other licensed health care professionals (the Practitioner), to provide me with telemedicine health care services (the "Services") as deemed necessary by the treating Practitioner. I acknowledge and consent to receive the Services by the Practitioner via telemedicine. I understand that the telemedicine visit will involve communicating with the Practitioner through live audiovisual communication technology and the disclosure of certain medical information by electronic transmission. I acknowledge that I have been given the opportunity to request an in-person assessment or other available alternative prior to the telemedicine visit and am voluntarily participating in the telemedicine visit.  I understand that I have the right to withhold or withdraw my consent to the use of telemedicine in the course of my care at any time, without affecting my right to future care or treatment, and that the Practitioner or I may terminate the telemedicine visit at any time. I understand that I have the right to inspect all information obtained and/or recorded in the course of the telemedicine visit and may receive copies of available information for a reasonable fee.  I understand that some  of the potential risks of receiving the Services via telemedicine include:  Marland Kitchen Delay or interruption in medical evaluation due to technological equipment failure or disruption; . Information transmitted may not be sufficient (e.g. poor resolution of images) to allow for appropriate medical decision  making by the Practitioner; and/or  . In rare instances, security protocols could fail, causing a breach of personal health information.  Furthermore, I acknowledge that it is my responsibility to provide information about my medical history, conditions and care that is complete and accurate to the best of my ability. I acknowledge that Practitioner's advice, recommendations, and/or decision may be based on factors not within their control, such as incomplete or inaccurate data provided by me or distortions of diagnostic images or specimens that may result from electronic transmissions. I understand that the practice of medicine is not an exact science and that Practitioner makes no warranties or guarantees regarding treatment outcomes. I acknowledge that I will receive a copy of this consent concurrently upon execution via email to the email address I last provided but may also request a printed copy by calling the office of [facility].    I understand that my insurance will be billed for this visit.   I have read or had this consent read to me. . I understand the contents of this consent, which adequately explains the benefits and risks of the Services being provided via telemedicine.  . I have been provided ample opportunity to ask questions regarding this consent and the Services and have had my questions answered to my satisfaction. . I give my informed consent for the services to be provided through the use of telemedicine in my medical care  By participating in this telemedicine visit I agree to the above.

## 2018-12-27 ENCOUNTER — Telehealth (INDEPENDENT_AMBULATORY_CARE_PROVIDER_SITE_OTHER): Payer: Medicare Other | Admitting: Student

## 2018-12-27 ENCOUNTER — Encounter: Payer: Self-pay | Admitting: Student

## 2018-12-27 VITALS — Ht 68.0 in | Wt 287.0 lb

## 2018-12-27 DIAGNOSIS — Z7901 Long term (current) use of anticoagulants: Secondary | ICD-10-CM

## 2018-12-27 DIAGNOSIS — I872 Venous insufficiency (chronic) (peripheral): Secondary | ICD-10-CM

## 2018-12-27 DIAGNOSIS — I480 Paroxysmal atrial fibrillation: Secondary | ICD-10-CM | POA: Diagnosis not present

## 2018-12-27 DIAGNOSIS — I10 Essential (primary) hypertension: Secondary | ICD-10-CM

## 2018-12-27 DIAGNOSIS — Z79899 Other long term (current) drug therapy: Secondary | ICD-10-CM

## 2018-12-27 MED ORDER — APIXABAN 5 MG PO TABS: 5.0000 mg | ORAL_TABLET | Freq: Two times a day (BID) | ORAL | 3 refills | Status: DC

## 2018-12-27 MED ORDER — METOPROLOL TARTRATE 50 MG PO TABS: 50.0000 mg | ORAL_TABLET | Freq: Two times a day (BID) | ORAL | 3 refills | Status: DC

## 2018-12-27 MED ORDER — FUROSEMIDE 20 MG PO TABS: 20.0000 mg | ORAL_TABLET | Freq: Every day | ORAL | 3 refills | Status: DC

## 2018-12-27 NOTE — Patient Instructions (Signed)
Medication Instructions:  Your physician recommends that you continue on your current medications as directed. Please refer to the Current Medication list given to you today. We have refilled your Lasix, Eliquis and Lopressor If you need a refill on your cardiac medications before your next appointment, please call your pharmacy.   Lab work: NONE  If you have labs (blood work) drawn today and your tests are completely normal, you will receive your results only by: Marland Kitchen MyChart Message (if you have MyChart) OR . A paper copy in the mail If you have any lab test that is abnormal or we need to change your treatment, we will call you to review the results.  Testing/Procedures: NONE   Follow-Up: At [facility], you and your health needs are our priority.  As part of our continuing mission to provide you with exceptional heart care, we have created designated Provider Care Teams.  These Care Teams include your primary Cardiologist (physician) and Advanced Practice Providers (APPs -  Physician Assistants and Nurse Practitioners) who all work together to provide you with the care you need, when you need it. You will need a follow up appointment in 2 months.  Please call our office 2 months in advance to schedule this appointment.  You may see Prentice Docker, MD or one of the following Advanced Practice Providers on your designated Care Team:   Randall An, PA-C [facility]) . Jacolyn Reedy, PA-C [facility] [place] [facility])  Any Other Special Instructions Will Be Listed Below (If Applicable). Thank you for choosing [facility]!  \

## 2018-12-27 NOTE — Progress Notes (Signed)
Virtual Visit via Telephone Note    Evaluation Performed:  Follow-up visit  This visit type was conducted due to national recommendations for restrictions regarding the COVID-19 Pandemic (e.g. social distancing).  This format is felt to be most appropriate for this patient at this time.  All issues noted in this document were discussed and addressed.  No physical exam was performed (except for noted visual exam findings with Video Visits).  Please refer to the patient's chart (MyChart message for video visits and phone note for telephone visits) for the patient's consent to telehealth for [facility].  Date:  12/27/2018   ID:  [name], DOB 1940/11/05, MRN [id number]  Patient Location:  [address] [place] [address]  Provider location:   [place], [place]  PCP:  Valla Leaver, MD  Cardiologist:  Prentice Docker, MD  Electrophysiologist:  None   Chief Complaint: Hospital Follow-Up  History of Present Illness:    [name] is a 78 y.o. female who presents via audio/video conferencing for a telehealth visit today. 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   She was recently admitted to [facility] on 12/03/2018 for evaluation of dyspnea and a nonproductive cough in the setting of recent PNA but was found to be in presumed new-onset atrial fibrillation while at Urgent Care and referred to the ED. K+ and TSH were WNL. Mg was low and appropriately replaced. PTA Lopressor was titrated from 25mg  BID to 50mg  BID and she was started on Eliquis 5mg  BID for anticoagulation given her CHA2DS2-VASc Score of 5 (HTN, Aortic Plaque, Female, Age (2)). She was continued on Lasix 20mg  daily and HCTZ was discontinued to avoid dual-diuretic therapy. Plans was for a possible DCCV in 3-4 weeks if she remained in atrial fibrillation.   In talking with the patient today, she reports overall doing well since  hospital discharge. Breathing continues to improve and she is using her incentive spirometer regularly. Still has a dry cough but this is improving as well. Denies any orthopnea or PND. Has chronic edema but weight has been stable on her home scales. Denies any chest pain or palpitations. She does not have a BP cuff and has no way of checking her HR or BP at home but denies any recent lightheadedness, dizziness, or presyncope.   Reports good compliance with her current medication regimen including Eliquis. No recent melena, hematochezia, or hematuria.    The patient does not have symptoms concerning for COVID-19 infection (fever, chills, or new shortness of breath). Does have a dry cough but this has been present for over a month and is improving.    Prior CV studies:   The following studies were reviewed today:  Echocardiogram: 12/04/2018 IMPRESSIONS  1. The left ventricle has normal systolic function, with an ejection fraction of 55-60%. The cavity size was normal. There is moderate concentric left ventricular hypertrophy. Left ventricular diastolic Doppler parameters are indeterminate secondary to  atrial fibrillation. No evidence of left ventricular regional wall motion abnormalities.  2. The right ventricle has normal systolic function. The cavity was normal. There is no increase in right ventricular wall thickness.  3. Left atrial size was moderate to severely dilated.  4. Right atrial size was mildly dilated.  5. There is mild mitral annular calcification present.  6. The tricuspid valve is grossly normal.  7. The aortic valve is tricuspid.  8. The aortic root is normal in size and structure.  Past Medical History:  Diagnosis Date   Anemia    Anxiety    Arthritis    Back pain    reason unknown   Cataract    bilateral immature   Diarrhea    Diverticulosis    GERD (gastroesophageal reflux disease)    takes Omeprazole daily   Headache(784.0)    occasionally   Hiatal  hernia    History of blood transfusion    no abnormal reaction noted   History of bronchitis 2014   History of colon polyps    History of shingles    Hypertension    takes Metoprolol,Amlodipine and Losartan daily   Internal hemorrhoids    Joint pain    Joint swelling    Lung nodule    Nocturia    Numbness    hands occasionally   Osteoarthritis    Peripheral edema    takes HCTZ daily and Furosemide daily as needed   PONV (postoperative nausea and vomiting)    Redness    to both lower extremities   Shortness of breath    WITH EXERTION    Sleep apnea    doesn't use cpap   Urinary frequency    Urinary urgency    Venous insufficiency    Past Surgical History:  Procedure Laterality Date   ABDOMINAL HYSTERECTOMY  1983   ANKLE SURGERY Left 1993   ANTERIOR CRUCIATE LIGAMENT REPAIR Left 2013   BREAST SURGERY     LEFT SIDE BIOPSY 1989   CHOLECYSTECTOMY  1990   COLONOSCOPY     ENDOVENOUS ABLATION SAPHENOUS VEIN W/ LASER Right 04/17/2017   endovenous laser ablation R GSV by Josephina GipJames Lawson MD   ESOPHAGOGASTRODUODENOSCOPY     HAND SURGERY Left 2011   QUADRICEPS TENDON REPAIR  07/16/2012   Procedure: REPAIR QUADRICEP TENDON;  Surgeon: Raymon MuttonStephen D Lucey, MD;  Location: [facility] OR;  Service: Orthopedics;  Laterality: Left;  open repair of VMO    quiadricp  10'21/2012   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   Procedure: TOTAL KNEE ARTHROPLASTY;  Surgeon: Raymon MuttonStephen D Lucey, MD;  Location: [facility] OR;  Service: Orthopedics;  Laterality: Left;   TOTAL KNEE ARTHROPLASTY Right 11/04/2013   DR Sherlean FootLUCEY   TOTAL KNEE ARTHROPLASTY Right 11/04/2013   Procedure: RIGHT TOTAL KNEE ARTHROPLASTY;  Surgeon: Dannielle HuhSteve Lucey, MD;  Location: [facility] OR;  Service: Orthopedics;  Laterality: Right;   TUBAL LIGATION  1974   vasculitic eruption of legs  1985     Current Meds  Medication Sig   acetaminophen (TYLENOL) 500 MG tablet Take 500 mg by mouth every 6 (six) hours as needed. For pain    apixaban (ELIQUIS) 5 MG TABS tablet Take 1 tablet (5 mg total) by mouth 2 (two) times daily.   Biotin 5000 MCG CAPS Take 1 capsule by mouth daily.   Cholecalciferol (VITAMIN D3) 10000 units capsule Take 10,000 Units by mouth daily.   furosemide (LASIX) 20 MG tablet Take 1 tablet (20 mg total) by mouth daily.   Glucosamine-Chondroitin (GLUCOSAMINE CHONDR COMPLEX PO) Take 1 capsule by mouth daily.    guaiFENesin (MUCINEX) 600 MG 12 hr tablet Take 600 mg by mouth 2 (two) times daily as needed for cough or to loosen phlegm.   losartan (COZAAR) 100 MG tablet Take 100 mg by mouth daily.   metoprolol tartrate (LOPRESSOR) 50 MG tablet Take 1 tablet (50 mg total) by mouth 2 (two) times daily.   Omega-3 Fatty Acids (OMEGA-3 FISH OIL PO) Take 1 capsule by  mouth daily.   omeprazole (PRILOSEC) 20 MG capsule Take 20 mg by mouth daily.   potassium gluconate 595 MG TABS tablet Take 1,190 mg by mouth daily.    triamcinolone lotion (KENALOG) 0.1 % Apply 1 application topically 3 (three) times daily.   VENTOLIN HFA 108 (90 Base) MCG/ACT inhaler Inhale 2 puffs into the lungs every 4 (four) hours as needed for wheezing or shortness of breath.    vitamin B-12 (CYANOCOBALAMIN) 1000 MCG tablet Take 1,000 mcg by mouth daily.   [DISCONTINUED] apixaban (ELIQUIS) 5 MG TABS tablet Take 1 tablet (5 mg total) by mouth 2 (two) times daily.   [DISCONTINUED] furosemide (LASIX) 20 MG tablet Take 1 tablet (20 mg total) by mouth daily.   [DISCONTINUED] metoprolol tartrate (LOPRESSOR) 50 MG tablet Take 1 tablet (50 mg total) by mouth 2 (two) times daily.     Allergies:   Sulfa antibiotics and Adhesive [tape]   Social History   Tobacco Use   Smoking status: Never Smoker   Smokeless tobacco: Never Used  Substance Use Topics   Alcohol use: No    Alcohol/week: 0.0 standard drinks   Drug use: No     Family Hx: The patient's family history includes Emphysema in her mother; Heart attack in her father; Heart  disease in her maternal grandfather, paternal grandfather, paternal grandmother, and sister; Heart failure in her sister; Hypertension in her mother; Kidney disease in her sister; Leukemia in her father; Lymphoma in her father; Rashes / Skin problems in her sister.  ROS:   Please see the history of present illness.     All other systems reviewed and are negative.   Labs/Other Tests and Data Reviewed:    Recent Labs: 12/03/2018: B Natriuretic Peptide 187.0 12/04/2018: BUN 18; Creatinine, Ser 1.02; Hemoglobin 12.0; Magnesium 2.0; Platelets 244; Potassium 3.6; Sodium 140; TSH 1.361   Recent Lipid Panel No results found for: CHOL, TRIG, HDL, CHOLHDL, LDLCALC, LDLDIRECT  Wt Readings from Last 3 Encounters:  12/27/18 287 lb (130.2 kg)  12/04/18 288 lb 12.8 oz (131 kg)  05/17/18 273 lb 12.8 oz (124.2 kg)     Objective:    Vital Signs:  Ht  (1.727 m)    Wt 287 lb (130.2 kg)    LMP 02/07/2012    BMI 43.64 kg/m    General: Pleasant female sounding in NAD Psych: Normal affect. Neuro: Alert and oriented X 3. Moves all extremities spontaneously. Lungs: Does not sound short of breath when talking on the phone.     ASSESSMENT & PLAN:    1. Paroxysmal Atrial Fibrillation/ Use of Long-Term Anticoagulation - she denies any recent palpitations but was not overly symptomatic when in the hospital so is unsure if she is still in atrial fibrillation. Denies any recent palpitations and her dyspnea continues to improve. Would continue Lopressor at current dosing for now for rate-control. No plans for DCCV at this time given the current COVID-19 situation. We did discuss a DCCV in the future once this improves and if she has not converted back to NSR. I am concerned about her ability to maintain NSR given her moderately to severely dilated LA and untreated OSA as she would be at high-risk for recurrence.  - she denies any evidence of active bleeding. Continue Eliquis  BID for anticoagulation.     2. HTN - she does not have a way of checking BP at home as she does not have a cuff. Continue current regimen for now including Losartan  100mg  daily and Lopressor 50mg  BID.   3. Chronic Venous Insufficiencies - followed by Vascular Surgery. Denies any recent changes in her edema. Remains on Lasix 20mg  daily.    COVID-19 Education: The signs and symptoms of COVID-19 were discussed with the patient and how to seek care for testing (follow up with PCP or arrange E-visit).  The importance of social distancing was discussed today.  Patient Risk:   After full review of this patient's clinical status, I feel that they are at least moderate risk at this time.  Time:   Today, I have spent 25 minutes with the patient with telehealth technology discussing the above cardiac issues.     Medication Adjustments/Labs and Tests Ordered: Current medicines are reviewed at length with the patient today.  Concerns regarding medicines are outlined above.  Tests Ordered: No orders of the defined types were placed in this encounter.  Medication Changes: Meds ordered this encounter  Medications   apixaban (ELIQUIS) 5 MG TABS tablet    Sig: Take 1 tablet (5 mg total) by mouth 2 (two) times daily.    Dispense:  180 tablet    Refill:  3   metoprolol tartrate (LOPRESSOR) 50 MG tablet    Sig: Take 1 tablet (50 mg total) by mouth 2 (two) times daily.    Dispense:  180 tablet    Refill:  3   furosemide (LASIX) 20 MG tablet    Sig: Take 1 tablet (20 mg total) by mouth daily.    Dispense:  90 tablet    Refill:  3    Disposition:  Follow up in 2 months with Dr. Purvis Sheffield or APP  Signed, Ellsworth Lennox, PA-C  12/27/2018 6:40 PM    [facility]

## 2018-12-28 ENCOUNTER — Telehealth: Payer: Self-pay | Admitting: Student

## 2018-12-28 NOTE — Telephone Encounter (Signed)
Patient was told by [facility] on phone visit yesterday to speak with Bradly Bienenstock in regards to patient assistance for Eliquis. / tg

## 2018-12-31 ENCOUNTER — Telehealth: Payer: Self-pay

## 2018-12-31 NOTE — Telephone Encounter (Signed)
Spoke with pt who with bring proof of income to office today and fill out assistance app.

## 2018-12-31 NOTE — Telephone Encounter (Signed)
Patient is to stop by today to complete form for [employer] patient assistance for Eliquis  2 boxes Eliquis 5 mg samples given, lot [id number],  Exp 02/2019

## 2019-01-01 ENCOUNTER — Telehealth: Payer: Self-pay

## 2019-01-01 NOTE — Telephone Encounter (Signed)
Application Case # : [id number]   Pt approved thru [employer] Pt Assistance Program for free Eliquis thru 09/26/2019   Pt made aware, await shipment to office

## 2019-01-03 ENCOUNTER — Telehealth: Payer: Self-pay

## 2019-01-03 NOTE — Telephone Encounter (Addendum)
Patient's Eliquis through the [employer] Patient Assistance Program has arrived. She will pick up tomorrow 01/04/19

## 2019-02-28 ENCOUNTER — Telehealth (INDEPENDENT_AMBULATORY_CARE_PROVIDER_SITE_OTHER): Payer: Medicare Other | Admitting: Student

## 2019-02-28 ENCOUNTER — Other Ambulatory Visit: Payer: Self-pay

## 2019-02-28 ENCOUNTER — Encounter: Payer: Self-pay | Admitting: Student

## 2019-02-28 VITALS — Ht 68.0 in | Wt 293.0 lb

## 2019-02-28 DIAGNOSIS — I10 Essential (primary) hypertension: Secondary | ICD-10-CM

## 2019-02-28 DIAGNOSIS — I480 Paroxysmal atrial fibrillation: Secondary | ICD-10-CM | POA: Diagnosis not present

## 2019-02-28 DIAGNOSIS — Z7189 Other specified counseling: Secondary | ICD-10-CM

## 2019-02-28 DIAGNOSIS — I872 Venous insufficiency (chronic) (peripheral): Secondary | ICD-10-CM

## 2019-02-28 NOTE — Patient Instructions (Signed)
Medication Instructions:  Your physician recommends that you continue on your current medications as directed. Please refer to the Current Medication list given to you today.  Take an extra Lasix for the next 3 days then resume normal dosing.   Labwork: NONE  Testing/Procedures: NONE  Follow-Up: Your physician wants you to follow-up in: 4-5 Months with Dr. Purvis Sheffield in the [place] office.You will receive a reminder letter in the mail two months in advance. If you don't receive a letter, please call our office to schedule the follow-up appointment.   Any Other Special Instructions Will Be Listed Below (If Applicable).     If you need a refill on your cardiac medications before your next appointment, please call your pharmacy.  Thank you for choosing [facility]!

## 2019-02-28 NOTE — Progress Notes (Signed)
Virtual Visit via Telephone Note   This visit type was conducted due to national recommendations for restrictions regarding the COVID-19 Pandemic (e.g. social distancing) in an effort to limit this patient's exposure and mitigate transmission in our community.  Due to her co-morbid illnesses, this patient is at least at moderate risk for complications without adequate follow up.  This format is felt to be most appropriate for this patient at this time.  The patient did not have access to video technology/had technical difficulties with video requiring transitioning to audio format only (telephone).  All issues noted in this document were discussed and addressed.  No physical exam could be performed with this format.  Please refer to the patient's chart for her  consent to telehealth for [facility].   Date:  02/28/2019   ID:  [name], DOB [birth year], MRN [id number]  Patient Location: Home Provider Location: Office  PCP:  Valla Leaver, MD  Cardiologist:  Prentice Docker, MD  Electrophysiologist:  None   Evaluation Performed:  Follow-Up Visit  Chief Complaint: Weight Gain; Edema  History of Present Illness:    [name] is a 78 y.o. female with past medical history of PAF (diagnosed in 12/2018), HTN, OSA, and bilateral varicosities who presents for a 35-month follow-up visit.   She most recently had a telehealth visit with myself in 12/2018 following a recent admission at [facility] for newly diagnosed atrial fibrillation with RVR. She was initiated on Eliquis 5mg  BID and Lopressor was titrated from 25mg  BID to 50mg  BID. At the time of her visit, she denied any recurrent symptoms and was feeling back to baseline.   In talking with the patient today, she reports her weight has increased by 6 lbs over the past few months but says this has been a gradual trend. Equates this to eating more food while quarantined at home.   She has baseline dyspnea on exertion but denies  any recent change in her symptoms. No associated chest pain or palpitations. She does not have a working BP cuff or way to check her HR but feels like she has been back in NSR as she has been feeling "normal". She has started to experience worsening lower extremity edema and was previously followed by the wound clinic prior to COVID. Reports good compliance with Lasix 20mg  daily.   The patient does not have symptoms concerning for COVID-19 infection (fever, chills, cough, or new shortness of breath).    Past Medical History:  Diagnosis Date  . Anemia   . Anxiety   . Arthritis   . Back pain    reason unknown  . Cataract    bilateral immature  . Diarrhea   . Diverticulosis   . GERD (gastroesophageal reflux disease)    takes Omeprazole daily  . Headache(784.0)    occasionally  . Hiatal hernia   . History of blood transfusion    no abnormal reaction noted  . History of bronchitis 2014  . History of colon polyps   . History of shingles   . Hypertension    takes Metoprolol,Amlodipine and Losartan daily  . Internal hemorrhoids   . Joint pain   . Joint swelling   . Lung nodule   . Nocturia   . Numbness    hands occasionally  . Osteoarthritis   . Peripheral edema    takes HCTZ daily and Furosemide daily as needed  . PONV (postoperative nausea and vomiting)   . Redness    to both  lower extremities  . Shortness of breath    WITH EXERTION   . Sleep apnea    doesn't use cpap  . Urinary frequency   . Urinary urgency   . Venous insufficiency    Past Surgical History:  Procedure Laterality Date  . ABDOMINAL HYSTERECTOMY  1983  . ANKLE SURGERY Left 1993  . ANTERIOR CRUCIATE LIGAMENT REPAIR Left 2013  . BREAST SURGERY     LEFT SIDE BIOPSY 1989  . CHOLECYSTECTOMY  1990  . COLONOSCOPY    . ENDOVENOUS ABLATION SAPHENOUS VEIN W/ LASER Right 04/17/2017   endovenous laser ablation R GSV by Josephina Gip MD  . ESOPHAGOGASTRODUODENOSCOPY    . HAND SURGERY Left 2011  . QUADRICEPS  TENDON REPAIR  07/16/2012   Procedure: REPAIR QUADRICEP TENDON;  Surgeon: Raymon Mutton, MD;  Location: [facility] OR;  Service: Orthopedics;  Laterality: Left;  open repair of VMO   . quiadricp  10'21/2012  . 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 TUBAL LIGATION  1974  . vasculitic eruption of legs  1985     Current Meds  Medication Sig  . acetaminophen (TYLENOL) 500 MG tablet Take 500 mg by mouth every 6 (six) hours as needed. For pain  . apixaban (ELIQUIS) 5 MG TABS tablet Take 1 tablet (5 mg total) by mouth 2 (two) times daily.  . Biotin 5000 MCG CAPS Take 1 capsule by mouth daily.  . Cholecalciferol (VITAMIN D3) 10000 units capsule Take 10,000 Units by mouth daily.  . furosemide (LASIX) 20 MG tablet Take 1 tablet (20 mg total) by mouth daily.  . Glucosamine-Chondroitin (GLUCOSAMINE CHONDR COMPLEX PO) Take 1 capsule by mouth daily.   Marland Kitchen guaiFENesin (MUCINEX) 600 MG 12 hr tablet Take 600 mg by mouth 2 (two) times daily as needed for cough or to loosen phlegm.  Marland Kitchen losartan (COZAAR) 100 MG tablet Take 100 mg by mouth daily.  . metoprolol tartrate (LOPRESSOR) 50 MG tablet Take 1 tablet (50 mg total) by mouth 2 (two) times daily.  . Omega-3 Fatty Acids (OMEGA-3 FISH OIL PO) Take 1 capsule by mouth daily.  Marland Kitchen omeprazole (PRILOSEC) 20 MG capsule Take 20 mg by mouth daily.  . potassium gluconate 595 MG TABS tablet Take 1,190 mg by mouth daily.   Marland Kitchen triamcinolone lotion (KENALOG) 0.1 % Apply 1 application topically 3 (three) times daily.  . VENTOLIN HFA 108 (90 Base) MCG/ACT inhaler Inhale 2 puffs into the lungs every 4 (four) hours as needed for  wheezing or shortness of breath.   . vitamin B-12 (CYANOCOBALAMIN) 1000 MCG tablet Take 1,000 mcg by mouth daily.     Allergies:   Sulfa antibiotics and Adhesive [tape]   Social History   Tobacco Use  . Smoking status: Never Smoker  . Smokeless tobacco: Never Used  Substance Use Topics  . Alcohol use: No    Alcohol/week: 0.0 standard drinks  . Drug use: No     Family Hx: The patient's family history includes Emphysema in her mother; Heart attack in her father; Heart disease in her maternal grandfather, paternal grandfather, paternal grandmother, and sister; Heart failure in her sister;  Hypertension in her mother; Kidney disease in her sister; Leukemia in her father; Lymphoma in her father; Rashes / Skin problems in her sister.  ROS:   Please see the history of present illness.     All other systems reviewed and are negative.   Prior CV studies:   The following studies were reviewed today:  Echocardiogram: 11/2018 IMPRESSIONS   1. The left ventricle has normal systolic function, with an ejection fraction of 55-60%. The cavity size was normal. There is moderate concentric left ventricular hypertrophy. Left ventricular diastolic Doppler parameters are indeterminate secondary to  atrial fibrillation. No evidence of left ventricular regional wall motion abnormalities.  2. The right ventricle has normal systolic function. The cavity was normal. There is no increase in right ventricular wall thickness.  3. Left atrial size was moderate to severely dilated.  4. Right atrial size was mildly dilated.  5. There is mild mitral annular calcification present.  6. The tricuspid valve is grossly normal.  7. The aortic valve is tricuspid.  8. The aortic root is normal in size and structure.  Labs/Other Tests and Data Reviewed:    EKG:  No ECG reviewed.  Recent Labs: 12/03/2018: B Natriuretic Peptide 187.0 12/04/2018: BUN 18; Creatinine, Ser 1.02; Hemoglobin 12.0; Magnesium 2.0; Platelets  244; Potassium 3.6; Sodium 140; TSH 1.361   Recent Lipid Panel No results found for: CHOL, TRIG, HDL, CHOLHDL, LDLCALC, LDLDIRECT  Wt Readings from Last 3 Encounters:  02/28/19 293 lb (132.9 kg)  12/27/18 287 lb (130.2 kg)  12/04/18 288 lb 12.8 oz (131 kg)     Objective:    Vital Signs:  Ht 5\' 8"  (1.727 m)   Wt 293 lb (132.9 kg)   LMP 02/07/2012   BMI 44.55 kg/m    General: Pleasant, female sounding in NAD Psych: Normal affect. Neuro: Alert and oriented X 3.  Lungs:  Resp regular and unlabored while talking on the phone.   ASSESSMENT & PLAN:    1. Paroxysmal Atrial Fibrillation - this was initially diagnosed in 12/2018 and she felt like she had converted back to NSR at the time of her last visit but an EKG was not obtained due to it being a telehealth visit. She still denies any recurrent symptoms. I suggested she have an in-office EKG to confirm that she is in NSR but she declines at this time and wishes to wait to have this with her PCP or at her next Cardiology visit once the COVID situation has improved. I informed her to make us aware of any change in her symptoms. Remains on Lopressor 50mg  BID.  - she denies any evidence of active bleeding. Continue Eliquis 5mg  BID for anticoagulation.   2. Chronic Venous Insufficiency - has been a chronic issue for the patient per her report. She has experienced a 6 lb weight gain but this has been gradual in onset over the past 2 months. Given her worsening edema, I recommended she take an extra Lasix tablet for the next 3 days then resume at 20mg  daily. We reviewed that she could use an extra 20mg  tablet in the future if needed for worsening edema or weight gain greater than 3 lbs overnight or 5 lbs in one week.   3. HTN - she does not have a BP cuff but says this was well-controlled at her last office visit with her PCP. Remains on Lopressor 50mg  BID and Losartan 100mg  daily. She is no longer taking HCTZ given the daily use of Lasix.  4. COVID-19 Education: The signs and symptoms of COVID-19 were discussed with the patient and how to seek care for testing (follow up with PCP or arrange E-visit).  The importance of social distancing was discussed today.  Time:   Today, I have spent 19 minutes with the patient with telehealth technology discussing the above problems.     Medication Adjustments/Labs and Tests Ordered: Current medicines are reviewed at length with the patient today.  Concerns regarding medicines are outlined above.   Tests Ordered: No orders of the defined types were placed in this encounter.   Medication Changes: No orders of the defined types were placed in this encounter.   Disposition:  Follow up with Dr. Purvis Sheffield in 4-5 months.   Signed, Ellsworth Lennox, PA-C  02/28/2019 5:00 PM    [facility]

## 2019-03-25 ENCOUNTER — Other Ambulatory Visit: Payer: Self-pay | Admitting: *Deleted

## 2019-03-25 MED ORDER — APIXABAN 5 MG PO TABS: 5.0000 mg | ORAL_TABLET | Freq: Two times a day (BID) | ORAL | 0 refills | Status: DC

## 2019-03-25 NOTE — Telephone Encounter (Signed)
Pt will call office when she is in parking lot and we will bring Eliquis to her (from [employer] pt assistance)

## 2019-06-20 ENCOUNTER — Telehealth: Payer: Self-pay | Admitting: *Deleted

## 2019-06-20 MED ORDER — APIXABAN 5 MG PO TABS: 5.0000 mg | ORAL_TABLET | Freq: Two times a day (BID) | ORAL | 0 refills | Status: DC

## 2019-06-20 NOTE — Telephone Encounter (Signed)
Pt will come by tomorrow or Monday to pick up Eliquis from [employer]

## 2019-07-19 ENCOUNTER — Ambulatory Visit: Payer: Medicare Other | Admitting: Cardiovascular Disease

## 2019-08-15 ENCOUNTER — Encounter: Payer: Self-pay | Admitting: Cardiology

## 2019-08-15 ENCOUNTER — Ambulatory Visit (INDEPENDENT_AMBULATORY_CARE_PROVIDER_SITE_OTHER): Payer: Medicare Other | Admitting: Cardiology

## 2019-08-15 VITALS — BP 178/96 | HR 78 | Ht 68.0 in | Wt 306.0 lb

## 2019-08-15 DIAGNOSIS — I10 Essential (primary) hypertension: Secondary | ICD-10-CM | POA: Diagnosis not present

## 2019-08-15 DIAGNOSIS — I872 Venous insufficiency (chronic) (peripheral): Secondary | ICD-10-CM

## 2019-08-15 DIAGNOSIS — I4819 Other persistent atrial fibrillation: Secondary | ICD-10-CM

## 2019-08-15 NOTE — Progress Notes (Signed)
Clinical Summary [name] is a 78 y.o.female seen today for follow up of the following medical problems.    1. Persistent afib - new diagnosis 12/2018 - rare palpitations - no bleeding on eliquis   2. HTN - does not check at home.     3. OSA - not using a cpap, has not been interested in     4. Chronic venous insufficiency - followed by vascular Dr [name] - swelling up and down.  - previously seen in wound clinic.  - wears compression stockings - has had some increased swelling recently  - home weights up to 306 lbs. BAseline 295 lbs.    SH: her significant other is [name] who is also a patient of mine.   Past Medical History:  Diagnosis Date  . Anemia   . Anxiety   . Arthritis   . Back pain    reason unknown  . Cataract    bilateral immature  . Diarrhea   . Diverticulosis   . GERD (gastroesophageal reflux disease)    takes Omeprazole daily  . Headache(784.0)    occasionally  . Hiatal hernia   . History of blood transfusion    no abnormal reaction noted  . History of bronchitis 2014  . History of colon polyps   . History of shingles   . Hypertension    takes Metoprolol,Amlodipine and Losartan daily  . Internal hemorrhoids   . Joint pain   . Joint swelling   . Lung nodule   . Nocturia   . Numbness    hands occasionally  . Osteoarthritis   . Peripheral edema    takes HCTZ daily and Furosemide daily as needed  . PONV (postoperative nausea and vomiting)   . Redness    to both lower extremities  . Shortness of breath    WITH EXERTION   . Sleep apnea    doesn't use cpap  . Urinary frequency   . Urinary urgency   . Venous insufficiency      Allergies  Allergen Reactions  . Sulfa Antibiotics     Reaction unknown  . Adhesive [Tape] Other (See Comments) and Rash    Skin is very sensitive Skin is very sensitive Skin is very sensitive     Current Outpatient Medications  Medication Sig Dispense Refill  . acetaminophen  (TYLENOL) 500 MG tablet Take 500 mg by mouth every 6 (six) hours as needed. For pain    . apixaban (ELIQUIS) 5 MG TABS tablet Take 1 tablet (5 mg total) by mouth 2 (two) times daily. 180 tablet 0  . Biotin 5000 MCG CAPS Take 1 capsule by mouth daily.    . Cholecalciferol (VITAMIN D3) 10000 units capsule Take 10,000 Units by mouth daily.    . furosemide (LASIX) 20 MG tablet Take 1 tablet (20 mg total) by mouth daily. 90 tablet 3  . Glucosamine-Chondroitin (GLUCOSAMINE CHONDR COMPLEX PO) Take 1 capsule by mouth daily.     Marland Kitchen. guaiFENesin (MUCINEX) 600 MG 12 hr tablet Take 600 mg by mouth 2 (two) times daily as needed for cough or to loosen phlegm.    . hydrochlorothiazide (HYDRODIURIL) 25 MG tablet TAKE 1 TABLET BY MOUTH EVERY MORNING FOR BLOOD PRESSURE    . losartan (COZAAR) 100 MG tablet Take 100 mg by mouth daily.    . metoprolol tartrate (LOPRESSOR) 50 MG tablet Take 1 tablet (50 mg total) by mouth 2 (two) times daily. 180 tablet 3  .  Omega-3 Fatty Acids (OMEGA-3 FISH OIL PO) Take 1 capsule by mouth daily.    Marland Kitchen omeprazole (PRILOSEC) 20 MG capsule Take 20 mg by mouth daily.    . potassium gluconate 595 MG TABS tablet Take 1,190 mg by mouth daily.     Marland Kitchen triamcinolone lotion (KENALOG) 0.1 % Apply 1 application topically 3 (three) times daily.    . VENTOLIN HFA 108 (90 Base) MCG/ACT inhaler Inhale 2 puffs into the lungs every 4 (four) hours as needed for wheezing or shortness of breath.     . vitamin B-12 (CYANOCOBALAMIN) 1000 MCG tablet Take 1,000 mcg by mouth daily.     No current facility-administered medications for this visit.      Past Surgical History:  Procedure Laterality Date  . ABDOMINAL HYSTERECTOMY  1983  . ANKLE SURGERY Left 1993  . ANTERIOR CRUCIATE LIGAMENT REPAIR Left 2013  . BREAST SURGERY     LEFT SIDE BIOPSY 1989  . CHOLECYSTECTOMY  1990  . COLONOSCOPY    . ENDOVENOUS ABLATION SAPHENOUS VEIN W/ LASER Right 04/17/2017   endovenous laser ablation R GSV by Tinnie Gens MD   . ESOPHAGOGASTRODUODENOSCOPY    . HAND SURGERY Left 2011  . QUADRICEPS TENDON REPAIR  07/16/2012   Procedure: REPAIR QUADRICEP TENDON;  Surgeon: Rudean Haskell, MD;  Location: [facility];  Service: Orthopedics;  Laterality: Left;  open repair of VMO   . quiadricp  10'21/2012  . 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   Procedure: TOTAL KNEE ARTHROPLASTY;  Surgeon: Rudean Haskell, MD;  Location: [facility];  Service: Orthopedics;  Laterality: Left;  . TOTAL KNEE ARTHROPLASTY Right 11/04/2013   DR Ronnie Derby  . TOTAL KNEE ARTHROPLASTY Right 11/04/2013   Procedure: RIGHT TOTAL KNEE ARTHROPLASTY;  Surgeon: Vickey Huger, MD;  Location: [facility];  Service: Orthopedics;  Laterality: Right;  . TUBAL LIGATION  1974  . vasculitic eruption of legs  1985     Allergies  Allergen Reactions  . Sulfa Antibiotics     Reaction unknown  . Adhesive [Tape] Other (See Comments) and Rash    Skin is very sensitive Skin is very sensitive Skin is very sensitive      Family History  Problem Relation Age of Onset  . Lymphoma Father   . Leukemia Father   . Heart attack Father   . Hypertension Mother   . Emphysema Mother   . Heart disease Sister   . Rashes / Skin problems Sister   . Heart failure Sister   . Kidney disease Sister   . Heart disease Maternal Grandfather   . Heart disease Paternal Grandmother   . Heart disease Paternal Grandfather      Social History [name] reports that she has never smoked. She has never used smokeless tobacco. [name] reports no history of alcohol use.   Review of Systems CONSTITUTIONAL: No weight loss, fever, chills, weakness or fatigue.  HEENT: Eyes: No visual loss, blurred vision, double vision or yellow sclerae.No hearing loss, sneezing, congestion, runny nose or sore throat.  SKIN: No rash or itching.  CARDIOVASCULAR: per hpi RESPIRATORY: No shortness of breath, cough or sputum.  GASTROINTESTINAL: No anorexia, nausea, vomiting or diarrhea. No abdominal pain or blood.   GENITOURINARY: No burning on urination, no polyuria NEUROLOGICAL: No headache, dizziness, syncope, paralysis, ataxia, numbness or tingling in the extremities. No change in bowel or bladder control.  MUSCULOSKELETAL: No muscle, back pain, joint pain or stiffness.  LYMPHATICS: No enlarged nodes. No history of splenectomy.  PSYCHIATRIC: No history of depression or anxiety.  ENDOCRINOLOGIC: No reports of sweating, cold or heat intolerance. No polyuria or polydipsia.  Marland Kitchen   Physical Examination Today's Vitals   08/15/19 1134  BP: (!) 178/96  Pulse: 78  SpO2: 95%  Weight: (!) 306 lb (138.8 kg)  Height: 5\' 8"  (1.727 m)   Body mass index is 46.53 kg/m.  Gen: resting comfortably, no acute distress HEENT: no scleral icterus, pupils equal round and reactive, no palptable cervical adenopathy,  CV: irreg, no m/r/g no jvd Resp: Clear to auscultation bilaterally GI: abdomen is soft, non-tender, non-distended, normal bowel sounds, no hepatosplenomegaly MSK: extremities are warm, no edema.  Skin: warm, no rash Neuro:  no focal deficits Psych: appropriate affect   Diagnostic Studies  11/2018 echo IMPRESSIONS    1. The left ventricle has normal systolic function, with an ejection fraction of 55-60%. The cavity size was normal. There is moderate concentric left ventricular hypertrophy. Left ventricular diastolic Doppler parameters are indeterminate secondary to  atrial fibrillation. No evidence of left ventricular regional wall motion abnormalities.  2. The right ventricle has normal systolic function. The cavity was normal. There is no increase in right ventricular wall thickness.  3. Left atrial size was moderate to severely dilated.  4. Right atrial size was mildly dilated.  5. There is mild mitral annular calcification present.  6. The tricuspid valve is grossly normal.  7. The aortic valve is tricuspid.  8. The aortic root is normal in size and structure.     Assessment and Plan   1. Persistent afib - no significant symptoms. Some SOB unclear if related or perhaps more due to fluid and weight gain - EKG shows she remains in rate controlled afib - we discussed potential cardioversion given that this is a relatlively new diagnosis of afib that has been peristent, she will give some thought to the procedure and also monitor her SOB as she diuresis.    2. Leg edema/Venous insufficiency - some increased swelling and weight, she will try taking her lasix 40mg  at home   3. HTN  manual recheck 140/90, follow with increased diuresis for now. She will also monitor at home to see if component of white coat HTN is involved   F/u 3 months   12/2018, M.D., F.A.C.C.

## 2019-08-15 NOTE — Patient Instructions (Signed)
Medication Instructions:  Your physician recommends that you continue on your current medications as directed. Please refer to the Current Medication list given to you today.   Labwork: NONE  Testing/Procedures: NONE  Follow-Up: Your physician recommends that you schedule a follow-up appointment in: 3 MONTHS    Any Other Special Instructions Will Be Listed Below (If Applicable).     If you need a refill on your cardiac medications before your next appointment, please call your pharmacy.   

## 2019-09-04 ENCOUNTER — Telehealth: Payer: Self-pay | Admitting: *Deleted

## 2019-09-04 NOTE — Telephone Encounter (Signed)
Received Eliquis from [facility] 3 month supply (pt assistance) Lot# [id number] exp 01/23/2022 - pt aware and will come by sometime this week to pick up

## 2019-10-14 ENCOUNTER — Telehealth: Payer: Self-pay | Admitting: Cardiology

## 2019-10-14 DIAGNOSIS — I4819 Other persistent atrial fibrillation: Secondary | ICD-10-CM

## 2019-10-14 DIAGNOSIS — R040 Epistaxis: Secondary | ICD-10-CM

## 2019-10-14 NOTE — Telephone Encounter (Signed)
At 6:45 yesterday, she had a horrific nose bleed and coughed up a blood clot.    Blood pressure was 122/60 after 45 this happened

## 2019-10-14 NOTE — Telephone Encounter (Signed)
Pt says yesterday morning was int he bathroom when nose started bleeding profusely lasting an 1 and 1/2 hours going through 5 folded paper towels - then pt says clot fell in her mouth and she spit it out and says it looked like blood clot that was an inch long - denies any bleeding in urine/stools or bleeding since yesterday's episode - says she has been getting dizzy when she changes positions from laying to standing

## 2019-10-15 NOTE — Telephone Encounter (Signed)
Pt voiced understanding - will have labs done at [facility] on Thursday - referral placed to ENT - pt aware that this would be in [facility]

## 2019-10-15 NOTE — Telephone Encounter (Signed)
Hold eliquis 3 days, if no recurrent bleeding restart. Please obtain a bmet and cbc this week. Please refer to ENT for nose bleeding.   Dominga Ferry MD

## 2019-10-17 ENCOUNTER — Other Ambulatory Visit (HOSPITAL_COMMUNITY)
Admission: RE | Admit: 2019-10-17 | Discharge: 2019-10-17 | Disposition: A | Discharge status: Home / Self Care | Payer: Medicare Other | Source: Ambulatory Visit | Attending: Cardiology | Admitting: Cardiology

## 2019-10-17 ENCOUNTER — Other Ambulatory Visit: Payer: Self-pay

## 2019-10-17 DIAGNOSIS — I4819 Other persistent atrial fibrillation: Secondary | ICD-10-CM | POA: Diagnosis present

## 2019-10-17 LAB — CBC
HCT: 40.1 % (ref 36.0–46.0)
Hemoglobin: 12.1 g/dL (ref 12.0–15.0)
MCH: 28 pg (ref 26.0–34.0)
MCHC: 30.2 g/dL (ref 30.0–36.0)
MCV: 92.8 fL (ref 80.0–100.0)
Platelets: 211 10*3/uL (ref 150–400)
RBC: 4.32 MIL/uL (ref 3.87–5.11)
RDW: 14.4 % (ref 11.5–15.5)
WBC: 7.8 10*3/uL (ref 4.0–10.5)
nRBC: 0 % (ref 0.0–0.2)

## 2019-10-17 LAB — BASIC METABOLIC PANEL
Anion gap: 9 (ref 5–15)
BUN: 20 mg/dL (ref 8–23)
CO2: 28 mmol/L (ref 22–32)
Calcium: 9.2 mg/dL (ref 8.9–10.3)
Chloride: 101 mmol/L (ref 98–111)
Creatinine, Ser: 1.1 mg/dL — ABNORMAL HIGH (ref 0.44–1.00)
GFR calc Af Amer: 55 mL/min — ABNORMAL LOW (ref >60)
GFR calc non Af Amer: 48 mL/min — ABNORMAL LOW (ref >60)
Glucose, Bld: 99 mg/dL (ref 70–99)
Potassium: 4.2 mmol/L (ref 3.5–5.1)
Sodium: 138 mmol/L (ref 135–145)

## 2019-10-18 ENCOUNTER — Telehealth: Payer: Self-pay | Admitting: *Deleted

## 2019-10-18 NOTE — Telephone Encounter (Signed)
-----   Message from Antoine Poche, MD sent at 10/18/2019  4:26 PM EST ----- Normal labs   Dominga Ferry MD

## 2019-10-18 NOTE — Telephone Encounter (Signed)
Just sent result note to you  Dominga Ferry MD

## 2019-10-18 NOTE — Telephone Encounter (Signed)
Patient informed. 

## 2019-10-18 NOTE — Telephone Encounter (Signed)
Advised that results have been sent to provider and she would be contacted once reviewed.

## 2019-10-25 ENCOUNTER — Other Ambulatory Visit: Payer: Self-pay

## 2019-10-25 ENCOUNTER — Ambulatory Visit (INDEPENDENT_AMBULATORY_CARE_PROVIDER_SITE_OTHER): Payer: Medicare Other | Admitting: Otolaryngology

## 2019-10-25 VITALS — Temp 97.3°F

## 2019-10-25 DIAGNOSIS — R040 Epistaxis: Secondary | ICD-10-CM | POA: Diagnosis not present

## 2019-10-25 NOTE — Progress Notes (Signed)
HPI: [name] is a 79 y.o. female who presents is referred by her cardiologist for evaluation of epistaxis. She apparently had a bad nosebleed from the right side 2 weeks ago when she was going to the restroom she had profuse bleeding from the right side of her nose that was difficult to stop with tissues but it eventually stopped.  But she also noticed some large blood clots coming down the back of her nose and out her throat.  She was seen by her cardiologist and had blood work performed and her hemoglobin hematocrit and not changed significantly over the past 6 months..  Her hemoglobin was 12.1 and hematocrit was 40.1.  Platelets were 211,000.  She has had no further episodes of nosebleeds since that time. She has been on Eliquis for the past year because of A. fib.  She has had no previous history of epistaxis. Past Medical History:  Diagnosis Date  . Anemia   . Anxiety   . Arthritis   . Back pain    reason unknown  . Cataract    bilateral immature  . Diarrhea   . Diverticulosis   . GERD (gastroesophageal reflux disease)    takes Omeprazole daily  . Headache(784.0)    occasionally  . Hiatal hernia   . History of blood transfusion    no abnormal reaction noted  . History of bronchitis 2014  . History of colon polyps   . History of shingles   . Hypertension    takes Metoprolol,Amlodipine and Losartan daily  . Internal hemorrhoids   . Joint pain   . Joint swelling   . Lung nodule   . Nocturia   . Numbness    hands occasionally  . Osteoarthritis   . Peripheral edema    takes HCTZ daily and Furosemide daily as needed  . PONV (postoperative nausea and vomiting)   . Redness    to both lower extremities  . Shortness of breath    WITH EXERTION   . Sleep apnea    doesn't use cpap  . Urinary frequency   . Urinary urgency   . Venous insufficiency    Past Surgical History:  Procedure Laterality Date  . ABDOMINAL HYSTERECTOMY  1983  . ANKLE SURGERY Left 1993  . ANTERIOR  CRUCIATE LIGAMENT REPAIR Left 2013  . BREAST SURGERY     LEFT SIDE BIOPSY 1989  . CHOLECYSTECTOMY  1990  . COLONOSCOPY    . ENDOVENOUS ABLATION SAPHENOUS VEIN W/ LASER Right 04/17/2017   endovenous laser ablation R GSV by Josephina Gip MD  . ESOPHAGOGASTRODUODENOSCOPY    . HAND SURGERY Left 2011  . QUADRICEPS TENDON REPAIR  07/16/2012   Procedure: REPAIR QUADRICEP TENDON;  Surgeon: Raymon Mutton, MD;  Location: [facility] OR;  Service: Orthopedics;  Laterality: Left;  open repair of VMO   . quiadricp  10'21/2012  . 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 TUBAL LIGATION  1974  . vasculitic eruption of legs  1985   Social History   Socioeconomic History  . Marital status: Single    Spouse name: Not on file  . Number of  children: 3  . Years of education: 85  . Highest education level: Not on file  Occupational History  . Occupation: retired  Tobacco Use  . Smoking status: Never Smoker  . Smokeless tobacco: Never Used  Substance and Sexual Activity  . Alcohol use: No    Alcohol/week: 0.0 standard drinks  . Drug use: No  . Sexual activity: Never    Birth control/protection: Surgical  Other Topics Concern  . Not on file  Social History Narrative   Fun: Exercise class twice per week.    Denies abuse and feels safe at home.    Social Determinants of Health   Financial Resource Strain:   . Difficulty of Paying Living Expenses: Not on file  Food Insecurity:   . Worried About Charity fundraiser in the Last Year: Not on file  . Ran Out of Food in the Last Year: Not on file  Transportation Needs:   . Lack of Transportation  (Medical): Not on file  . Lack of Transportation (Non-Medical): Not on file  Physical Activity:   . Days of Exercise per Week: Not on file  . Minutes of Exercise per Session: Not on file  Stress:   . Feeling of Stress : Not on file  Social Connections:   . Frequency of Communication with Friends and Family: Not on file  . Frequency of Social Gatherings with Friends and Family: Not on file  . Attends Religious Services: Not on file  . Active Member of Clubs or Organizations: Not on file  . Attends Archivist Meetings: Not on file  . Marital Status: Not on file   Family History  Problem Relation Age of Onset  . Lymphoma Father   . Leukemia Father   . Heart attack Father   . Hypertension Mother   . Emphysema Mother   . Heart disease Sister   . Rashes / Skin problems Sister   . Heart failure Sister   . Kidney disease Sister   . Heart disease Maternal Grandfather   . Heart disease Paternal Grandmother   . Heart disease Paternal Grandfather    Allergies  Allergen Reactions  . Sulfa Antibiotics     Reaction unknown  . Adhesive [Tape] Other (See Comments) and Rash    Skin is very sensitive Skin is very sensitive Skin is very sensitive   Prior to Admission medications   Medication Sig Start Date End Date Taking? Authorizing Provider  acetaminophen (TYLENOL) 500 MG tablet Take 500 mg by mouth every 6 (six) hours as needed. For pain   Yes [provider]  apixaban (ELIQUIS) 5 MG TABS tablet Take 1 tablet (5 mg total) by mouth 2 (two) times daily. 06/20/19  Yes Herminio Commons, MD  Biotin 5000 MCG CAPS Take 1 capsule by mouth daily.   Yes [provider]  Cholecalciferol (VITAMIN D3) 10000 units capsule Take 10,000 Units by mouth daily.   Yes [provider]  furosemide (LASIX) 20 MG tablet Take 1 tablet (20 mg total) by mouth daily. 12/27/18  Yes Strader, [place] M, PA-C  Glucosamine-Chondroitin (GLUCOSAMINE CHONDR COMPLEX PO) Take 1 capsule  by mouth daily.    Yes [provider]  guaiFENesin (MUCINEX) 600 MG 12 hr tablet Take 600 mg by mouth 2 (two) times daily as needed for cough or to loosen phlegm.   Yes [provider]  hydrochlorothiazide (HYDRODIURIL) 25 MG tablet TAKE 1 TABLET BY MOUTH EVERY MORNING FOR BLOOD PRESSURE 12/29/18  Yes  [provider]  irbesartan (AVAPRO) 150 MG tablet Take 150 mg by mouth daily.   Yes [provider]  metoprolol tartrate (LOPRESSOR) 50 MG tablet Take 1 tablet (50 mg total) by mouth 2 (two) times daily. 12/27/18  Yes Strader, [place] M, PA-C  Omega-3 Fatty Acids (OMEGA-3 FISH OIL PO) Take 1 capsule by mouth daily.   Yes [provider]  omeprazole (PRILOSEC) 20 MG capsule Take 20 mg by mouth daily.   Yes [provider]  potassium gluconate 595 MG TABS tablet Take 1,190 mg by mouth daily.    Yes [provider]  triamcinolone lotion (KENALOG) 0.1 % Apply 1 application topically 3 (three) times daily.   Yes [provider]  VENTOLIN HFA 108 (90 Base) MCG/ACT inhaler Inhale 2 puffs into the lungs every 4 (four) hours as needed for wheezing or shortness of breath.  11/22/18  Yes [provider]  vitamin B-12 (CYANOCOBALAMIN) 1000 MCG tablet Take 1,000 mcg by mouth daily.   Yes [provider]     Positive ROS: Otherwise negative  All other systems have been reviewed and were otherwise negative with the exception of those mentioned in the HPI and as above.  Physical Exam: Constitutional: Alert, well-appearing, no acute distress Ears: External ears without lesions or tenderness. Ear canals are clear bilaterally with intact, clear TMs.  Nasal: External nose without lesions. Septum relatively midline.  On anterior rhinoscopy could not identify definite site or origin of the epistaxis.  She has no prominent blood vessels on the septum although she has several small vessels along Kiesselbach's plexus which may have been  the etiology of the nosebleeds.. On nasal endoscopy the posterior nasal cavity middle meatus nasopharynx was all normal to evaluation with no abnormal lesions noted.  Again could not identify definite site of origin of the epistaxis and no prominent vessels noted. Oral: Lips and gums without lesions. Tongue and palate mucosa without lesions. Posterior oropharynx clear. Neck: No palpable adenopathy or masses Respiratory: Breathing comfortably  Skin: No facial/neck lesions or rash noted.  Nasal/sinus endoscopy  Date/Time: 10/25/2019 1:20 PM Performed by: Drema Halon, MD Authorized by: Drema Halon, MD   Consent:    Consent obtained:  Verbal   Consent given by:  Patient   Risks discussed:  Pain Procedure details:    Indications: sino-nasal symptoms     Medication:  Afrin   Instrument: flexible fiberoptic nasal endoscope     Scope location: right nare   Septum:    normal   Sinus:    Right middle meatus: normal     Right nasopharynx: normal     Right Eustachian tube orifices: normal   Comments:     On right nasal endoscopy the right nasal cavity was clear.  I cannot identify definite site or origin of recent epistaxis.    Assessment: Right-sided epistaxis  Plan: Reviewed with patient today that I cannot identify definite site of origin of the recent epistaxis.  Reviewed with her concerning using anterior packing with cotton ball soaked in either Afrin or cold water with pressure as this will stop most nosebleeds. She will follow up here next time she has a nosebleed.   Narda Bonds, MD   CC:

## 2019-10-29 ENCOUNTER — Telehealth: Payer: Self-pay | Admitting: *Deleted

## 2019-10-29 NOTE — Telephone Encounter (Signed)
Pt verified that she had resumed Eliquis and denies any bleeding     Let patient know we reviewed the records from ENT, please verify she is back on eliquis and has not had recurrent bleeding    Dominga Ferry MD

## 2019-11-15 ENCOUNTER — Ambulatory Visit: Payer: Medicare Other | Admitting: Cardiology

## 2019-12-06 ENCOUNTER — Other Ambulatory Visit: Payer: Self-pay | Admitting: Student

## 2019-12-13 ENCOUNTER — Telehealth: Payer: Self-pay | Admitting: Cardiology

## 2019-12-13 NOTE — Telephone Encounter (Signed)
Patient received a denial letter from [facility] stating she was denied because she needed to meet the 3% out of pocket expense for total household income. Patient says her monthly income is $1742.00 and yearly $20,904.00. Advised patient to call [employer] patient assistance to get the total amount she needed to pay out of pocket. Patient says she will call office back with amount.

## 2019-12-13 NOTE — Telephone Encounter (Signed)
Patient needs to speak with someone in regards to be denied of Eliquis.

## 2019-12-17 ENCOUNTER — Telehealth: Payer: Self-pay | Admitting: Cardiology

## 2019-12-17 MED ORDER — APIXABAN 5 MG PO TABS: 5.0000 mg | ORAL_TABLET | Freq: Two times a day (BID) | ORAL | 3 refills | Status: DC

## 2019-12-17 MED ORDER — APIXABAN 5 MG PO TABS: 5.0000 mg | ORAL_TABLET | Freq: Two times a day (BID) | ORAL | 0 refills | Status: DC

## 2019-12-17 NOTE — Telephone Encounter (Signed)
See previous phone note.  

## 2019-12-17 NOTE — Telephone Encounter (Signed)
Has answer about Eliquis issue

## 2019-12-17 NOTE — Telephone Encounter (Signed)
Pt spoke with [employer] (Eliquis 5mg  bid) and she has to spend $288 out of pocket - sent rx to [facility] - also will give pt samples of 2.5 mg with instructions to take 2 tablets twice daily (out of 5mg  samples)

## 2019-12-17 NOTE — Telephone Encounter (Signed)
Per patient she has to spend $288.08 out of pocket and then [employer] will provide eliquis for the remainder of the year.

## 2019-12-24 ENCOUNTER — Telehealth: Payer: Self-pay | Admitting: *Deleted

## 2019-12-24 NOTE — Telephone Encounter (Signed)
[employer] sent approval for Eliquis case # [id number] from 12/23/2019 - 09/25/2020 - letter send for scanning

## 2020-01-27 ENCOUNTER — Other Ambulatory Visit: Payer: Self-pay

## 2020-01-27 ENCOUNTER — Ambulatory Visit (INDEPENDENT_AMBULATORY_CARE_PROVIDER_SITE_OTHER): Payer: Medicare Other | Admitting: Cardiology

## 2020-01-27 ENCOUNTER — Encounter: Payer: Self-pay | Admitting: Cardiology

## 2020-01-27 VITALS — BP 154/82 | HR 78 | Ht 67.0 in | Wt 276.0 lb

## 2020-01-27 DIAGNOSIS — R600 Localized edema: Secondary | ICD-10-CM

## 2020-01-27 DIAGNOSIS — I4819 Other persistent atrial fibrillation: Secondary | ICD-10-CM

## 2020-01-27 DIAGNOSIS — I10 Essential (primary) hypertension: Secondary | ICD-10-CM | POA: Diagnosis not present

## 2020-01-27 NOTE — Patient Instructions (Signed)

## 2020-01-27 NOTE — Progress Notes (Signed)
Clinical Summary [name] is a 79 y.o.female seen today for follow up of the following medical problems.    1. Persistent afib - new diagnosis 12/2018 - rare palpitations - no bleeding on eliquis  - no recent palpitations - no bleeding on eliquis.  2. HTN - does not check at home.     3. OSA - not using a cpap, has not been interested in     4. Chronic venous insufficiency - followed by vascular Dr Hart Rochester - swelling up and down.  - previously seen in wound clinic.  - wears compression stockings  - weight down to 276 from 306 last visit. Reports significant dieatary changes - swelling improving in the legs - has compression socks at home.  - chronic DOE she attributes to deconditioning.    SH: her significant other is Renold Genta who is also a patient of mine who passed away 12-01-2019    Had covid vaccine x 2 Past Medical History:  Diagnosis Date  . Anemia   . Anxiety   . Arthritis   . Back pain    reason unknown  . Cataract    bilateral immature  . Diarrhea   . Diverticulosis   . GERD (gastroesophageal reflux disease)    takes Omeprazole daily  . Headache(784.0)    occasionally  . Hiatal hernia   . History of blood transfusion    no abnormal reaction noted  . History of bronchitis 2014  . History of colon polyps   . History of shingles   . Hypertension    takes Metoprolol,Amlodipine and Losartan daily  . Internal hemorrhoids   . Joint pain   . Joint swelling   . Lung nodule   . Nocturia   . Numbness    hands occasionally  . Osteoarthritis   . Peripheral edema    takes HCTZ daily and Furosemide daily as needed  . PONV (postoperative nausea and vomiting)   . Redness    to both lower extremities  . Shortness of breath    WITH EXERTION   . Sleep apnea    doesn't use cpap  . Urinary frequency   . Urinary urgency   . Venous insufficiency      Allergies  Allergen Reactions  . Sulfa Antibiotics     Reaction unknown  .  Adhesive [Tape] Other (See Comments) and Rash    Skin is very sensitive Skin is very sensitive Skin is very sensitive     Current Outpatient Medications  Medication Sig Dispense Refill  . acetaminophen (TYLENOL) 500 MG tablet Take 500 mg by mouth every 6 (six) hours as needed. For pain    . apixaban (ELIQUIS) 5 MG TABS tablet Take 1 tablet (5 mg total) by mouth 2 (two) times daily. 56 tablet 0  . Biotin 5000 MCG CAPS Take 1 capsule by mouth daily.    . Cholecalciferol (VITAMIN D3) 10000 units capsule Take 10,000 Units by mouth daily.    . furosemide (LASIX) 20 MG tablet Take 1 tablet (20 mg total) by mouth daily. 90 tablet 3  . Glucosamine-Chondroitin (GLUCOSAMINE CHONDR COMPLEX PO) Take 1 capsule by mouth daily.     Marland Kitchen guaiFENesin (MUCINEX) 600 MG 12 hr tablet Take 600 mg by mouth 2 (two) times daily as needed for cough or to loosen phlegm.    . hydrochlorothiazide (HYDRODIURIL) 25 MG tablet TAKE 1 TABLET BY MOUTH EVERY MORNING FOR BLOOD PRESSURE    . irbesartan (AVAPRO) 150  MG tablet Take 150 mg by mouth daily.    . metoprolol tartrate (LOPRESSOR) 50 MG tablet TAKE 1 TABLET BY MOUTH TWICE A DAY 180 tablet 3  . Omega-3 Fatty Acids (OMEGA-3 FISH OIL PO) Take 1 capsule by mouth daily.    Marland Kitchen omeprazole (PRILOSEC) 20 MG capsule Take 20 mg by mouth daily.    . potassium gluconate 595 MG TABS tablet Take 1,190 mg by mouth daily.     Marland Kitchen triamcinolone lotion (KENALOG) 0.1 % Apply 1 application topically 3 (three) times daily.    . VENTOLIN HFA 108 (90 Base) MCG/ACT inhaler Inhale 2 puffs into the lungs every 4 (four) hours as needed for wheezing or shortness of breath.     . vitamin B-12 (CYANOCOBALAMIN) 1000 MCG tablet Take 1,000 mcg by mouth daily.     No current facility-administered medications for this visit.     Past Surgical History:  Procedure Laterality Date  . ABDOMINAL HYSTERECTOMY  1983  . ANKLE SURGERY Left 1993  . ANTERIOR CRUCIATE LIGAMENT REPAIR Left 2013  . BREAST SURGERY       LEFT SIDE BIOPSY 1989  . CHOLECYSTECTOMY  1990  . COLONOSCOPY    . ENDOVENOUS ABLATION SAPHENOUS VEIN W/ LASER Right 04/17/2017   endovenous laser ablation R GSV by Tinnie Gens MD  . ESOPHAGOGASTRODUODENOSCOPY    . HAND SURGERY Left 2011  . QUADRICEPS TENDON REPAIR  07/16/2012   Procedure: REPAIR QUADRICEP TENDON;  Surgeon: Rudean Haskell, MD;  Location: [place];  Service: Orthopedics;  Laterality: Left;  open repair of VMO   . quiadricp  10'21/2012  . 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   Procedure: TOTAL KNEE ARTHROPLASTY;  Surgeon: Rudean Haskell, MD;  Location: [place];  Service: Orthopedics;  Laterality: Left;  . TOTAL KNEE ARTHROPLASTY Right 11/04/2013   DR Ronnie Derby  . TOTAL KNEE ARTHROPLASTY Right 11/04/2013   Procedure: RIGHT TOTAL KNEE ARTHROPLASTY;  Surgeon: Vickey Huger, MD;  Location: [place];  Service: Orthopedics;  Laterality: Right;  . TUBAL LIGATION  1974  . vasculitic eruption of legs  1985     Allergies  Allergen Reactions  . Sulfa Antibiotics     Reaction unknown  . Adhesive [Tape] Other (See Comments) and Rash    Skin is very sensitive Skin is very sensitive Skin is very sensitive      Family History  Problem Relation Age of Onset  . Lymphoma Father   . Leukemia Father   . Heart attack Father   . Hypertension Mother   . Emphysema Mother   . Heart disease Sister   . Rashes / Skin problems Sister   . Heart failure Sister   . Kidney disease Sister   . Heart disease Maternal Grandfather   . Heart disease Paternal Grandmother   . Heart disease Paternal Grandfather      Social History [name] reports that she has never smoked. She has never used smokeless tobacco. [name] reports no history of alcohol use.   Review of Systems CONSTITUTIONAL: No weight loss, fever, chills, weakness or fatigue.  HEENT: Eyes: No visual loss, blurred vision, double vision or yellow sclerae.No hearing loss, sneezing, congestion, runny nose or sore throat.  SKIN: No rash  or itching.  CARDIOVASCULAR: per hpi RESPIRATORY: No shortness of breath, cough or sputum.  GASTROINTESTINAL: No anorexia, nausea, vomiting or diarrhea. No abdominal pain or blood.  GENITOURINARY: No burning on urination, no polyuria NEUROLOGICAL: No headache, dizziness, syncope, paralysis, ataxia, numbness  or tingling in the extremities. No change in bowel or bladder control.  MUSCULOSKELETAL: No muscle, back pain, joint pain or stiffness.  LYMPHATICS: No enlarged nodes. No history of splenectomy.  PSYCHIATRIC: No history of depression or anxiety.  ENDOCRINOLOGIC: No reports of sweating, cold or heat intolerance. No polyuria or polydipsia.  Marland Kitchen   Physical Examination Today's Vitals   01/27/20 1555  BP: (!) 154/82  Pulse: 78  SpO2: 96%  Weight: 276 lb (125.2 kg)  Height: 5\' 7"  (1.702 m)   Body mass index is 43.23 kg/m.  Gen: resting comfortably, no acute distress HEENT: no scleral icterus, pupils equal round and reactive, no palptable cervical adenopathy,  CV: irreg, no m/r/g, no jvd Resp: Clear to auscultation bilaterally GI: abdomen is soft, non-tender, non-distended, normal bowel sounds, no hepatosplenomegaly MSK: extremities are warm, bilateral nonpitting edema Skin: warm, no rash Neuro:  no focal deficits Psych: appropriate affect   Diagnostic Studies 11/2018 echo IMPRESSIONS   1. The left ventricle has normal systolic function, with an ejection fraction of 55-60%. The cavity size was normal. There is moderate concentric left ventricular hypertrophy. Left ventricular diastolic Doppler parameters are indeterminate secondary to  atrial fibrillation. No evidence of left ventricular regional wall motion abnormalities. 2. The right ventricle has normal systolic function. The cavity was normal. There is no increase in right ventricular wall thickness. 3. Left atrial size was moderate to severely dilated. 4. Right atrial size was mildly dilated. 5. There is mild mitral  annular calcification present. 6. The tricuspid valve is grossly normal. 7. The aortic valve is tricuspid. 8. The aortic root is normal in size and structure.     Assessment and Plan   1. Persistent afib -no symptoms, continue current meds - she previously turned down attempt at DCCV   2. Leg edema/Venous insufficiency - improving, continue diuretic and compression   3. HTN  elevated, she is monitoring at home currently for her pcp - f/u home bp's, if consistent elevated would consider starting norvasc.  F/u 6 monthjs   12/2018, M.D.

## 2020-06-05 ENCOUNTER — Other Ambulatory Visit: Payer: Self-pay | Admitting: Student

## 2020-06-05 ENCOUNTER — Telehealth: Payer: Self-pay | Admitting: *Deleted

## 2020-06-05 NOTE — Telephone Encounter (Signed)
Pt notified that pt assistance Eliquis has arrived in office and she can pick up in the [place] office.

## 2020-06-08 NOTE — Telephone Encounter (Signed)
This is a Scientist, research (physical sciences) pt. Dr. Wyline Mood

## 2020-06-22 ENCOUNTER — Telehealth: Payer: Self-pay | Admitting: Cardiology

## 2020-06-22 NOTE — Telephone Encounter (Signed)
Patient is on Eliquis for atrial fibrillation inquiring about taking ibuprofen for a recent muscle pull. Patient is without a prior history of GI bleed but was seen for epistaxis in January this year but has resolved. CrCl 48, CBC WNL in January 2021.    Recommend patient schedules Tylenol and limiting ibuprofen or other NSAIDs due to increased risk of bleeding while on Eliquis. Patient may try a topical NSAID such as diclofenac. Patient may also try cool compresses and elevating her leg to help alleviate some inflammation.   Returned call to patient and discussed the above. She verbalized understanding.

## 2020-06-22 NOTE — Telephone Encounter (Signed)
Patient called stating that she is on Eliquis. Last week she pulled a muscle in her leg causing inflammation. She is wanting to know if she can take Advil.

## 2020-08-04 ENCOUNTER — Encounter: Payer: Self-pay | Admitting: Cardiology

## 2020-08-04 ENCOUNTER — Ambulatory Visit (INDEPENDENT_AMBULATORY_CARE_PROVIDER_SITE_OTHER): Payer: Medicare Other | Admitting: Cardiology

## 2020-08-04 VITALS — BP 154/88 | HR 72 | Ht 68.0 in | Wt 272.0 lb

## 2020-08-04 DIAGNOSIS — I10 Essential (primary) hypertension: Secondary | ICD-10-CM

## 2020-08-04 DIAGNOSIS — I4819 Other persistent atrial fibrillation: Secondary | ICD-10-CM | POA: Diagnosis not present

## 2020-08-04 DIAGNOSIS — R600 Localized edema: Secondary | ICD-10-CM

## 2020-08-04 NOTE — Patient Instructions (Signed)
Your physician wants you to follow-up in: 6 MONTHS WITH DR [name] You will receive a reminder letter in the mail two months in advance. If you don't receive a letter, please call our office to schedule the follow-up appointment.  Your physician has recommended you make the following change in your medication:   STOP HCTZ  CALL us Friday WITH BLOOD PRESSURE READINGS  Thank you for choosing [facility]!!

## 2020-08-04 NOTE — Progress Notes (Signed)
Clinical Summary [name] is a 79 y.o.female seen today for follow up of the following medical problems.   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  - she has turned down DCCV.   2. HTN - does not check at home. - reports some white coat HTN in the past   3. OSA - not usingacpap, has not been interested in     4. Chronic venous insufficiency - followed by vascular Dr Hart Rochester - swelling up and down.  - previously seen in wound clinic.  - wears compression stockings  - weight down to 276 from 306 last visit. Reports significant dieatary changes - swelling improving in the legs - has compression socks  - overall edema is stable.     SH: her significant other is [name] who is also a patient of mine who passed away 30-Nov-2019    Past Medical History:  Diagnosis Date  . Anemia   . Anxiety   . Arthritis   . Back pain    reason unknown  . Cataract    bilateral immature  . Diarrhea   . Diverticulosis   . GERD (gastroesophageal reflux disease)    takes Omeprazole daily  . Headache(784.0)    occasionally  . Hiatal hernia   . History of blood transfusion    no abnormal reaction noted  . History of bronchitis 2014  . History of colon polyps   . History of shingles   . Hypertension    takes Metoprolol,Amlodipine and Losartan daily  . Internal hemorrhoids   . Joint pain   . Joint swelling   . Lung nodule   . Nocturia   . Numbness    hands occasionally  . Osteoarthritis   . Peripheral edema    takes HCTZ daily and Furosemide daily as needed  . PONV (postoperative nausea and vomiting)   . Redness    to both lower extremities  . Shortness of breath    WITH EXERTION   . Sleep apnea    doesn't use cpap  . Urinary frequency   . Urinary urgency   . Venous insufficiency      Allergies  Allergen Reactions  . Sulfa Antibiotics     Reaction unknown    . Adhesive [Tape] Other (See Comments) and Rash    Skin is very sensitive Skin is very sensitive Skin is very sensitive     Current Outpatient Medications  Medication Sig Dispense Refill  . acetaminophen (TYLENOL) 500 MG tablet Take 500 mg by mouth every 6 (six) hours as needed. For pain    . apixaban (ELIQUIS) 5 MG TABS tablet Take 1 tablet (5 mg total) by mouth 2 (two) times daily. 56 tablet 0  . Biotin 5000 MCG CAPS Take 1 capsule by mouth daily.    . Cholecalciferol (VITAMIN D3) 10000 units capsule Take 10,000 Units by mouth daily.    . furosemide (LASIX) 20 MG tablet TAKE 1 TABLET BY MOUTH EVERY DAY 90 tablet 1  . Glucosamine-Chondroitin (GLUCOSAMINE CHONDR COMPLEX PO) Take 1 capsule by mouth daily.     Marland Kitchen guaiFENesin (MUCINEX) 600 MG 12 hr tablet Take 600 mg by mouth 2 (two) times daily as needed for cough or to loosen phlegm.    . hydrochlorothiazide (HYDRODIURIL) 25 MG tablet Does not take with lasix    . irbesartan (AVAPRO) 150 MG tablet Take 150 mg by mouth  daily.    . metoprolol tartrate (LOPRESSOR) 50 MG tablet TAKE 1 TABLET BY MOUTH TWICE A DAY 180 tablet 3  . Omega-3 Fatty Acids (OMEGA-3 FISH OIL PO) Take 1 capsule by mouth daily.    Marland Kitchen omeprazole (PRILOSEC) 20 MG capsule Take 20 mg by mouth daily.    . potassium gluconate 595 MG TABS tablet Take 1,190 mg by mouth daily.     Marland Kitchen triamcinolone lotion (KENALOG) 0.1 % Apply 1 application topically 3 (three) times daily.    . vitamin B-12 (CYANOCOBALAMIN) 1000 MCG tablet Take 1,000 mcg by mouth daily.     No current facility-administered medications for this visit.     Past Surgical History:  Procedure Laterality Date  . ABDOMINAL HYSTERECTOMY  1983  . ANKLE SURGERY Left 1993  . ANTERIOR CRUCIATE LIGAMENT REPAIR Left 2013  . BREAST SURGERY     LEFT SIDE BIOPSY 1989  . CHOLECYSTECTOMY  1990  . COLONOSCOPY    . ENDOVENOUS ABLATION SAPHENOUS VEIN W/ LASER Right 04/17/2017   endovenous laser ablation R GSV by Josephina Gip  MD  . ESOPHAGOGASTRODUODENOSCOPY    . HAND SURGERY Left 2011  . QUADRICEPS TENDON REPAIR  07/16/2012   Procedure: REPAIR QUADRICEP TENDON;  Surgeon: Raymon Mutton, MD;  Location: [facility] OR;  Service: Orthopedics;  Laterality: Left;  open repair of VMO   . quiadricp  10'21/2012  . 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 TUBAL LIGATION  1974  . vasculitic eruption of legs  1985     Allergies  Allergen Reactions  . Sulfa Antibiotics     Reaction unknown  . Adhesive [Tape] Other (See Comments) and Rash    Skin is very sensitive Skin is very sensitive Skin is very sensitive      Family History  Problem Relation Age of Onset  . Lymphoma Father   . Leukemia Father   . Heart attack Father   . Hypertension Mother   . Emphysema Mother   . Heart disease Sister   . Rashes / Skin problems Sister   . Heart failure Sister   . Kidney disease Sister   . Heart disease Maternal Grandfather   . Heart disease Paternal Grandmother   . Heart disease Paternal Grandfather      Social History Ms. Menge reports that she has never smoked. She has never used smokeless tobacco. Ms. Pescador reports no history of alcohol use.   Review of Systems CONSTITUTIONAL: No weight loss, fever, chills, weakness or fatigue.  HEENT: Eyes: No visual loss, blurred vision, double vision or yellow sclerae.No hearing loss, sneezing, congestion, runny nose or sore throat.  SKIN: No rash or itching.  CARDIOVASCULAR: per hpi RESPIRATORY: per hpi GASTROINTESTINAL: No anorexia, nausea, vomiting or diarrhea. No abdominal pain or blood.  GENITOURINARY: No burning on  urination, no polyuria NEUROLOGICAL: No headache, dizziness, syncope, paralysis, ataxia, numbness or tingling in the extremities. No change in bowel or bladder control.  MUSCULOSKELETAL: No muscle, back pain, joint pain or stiffness.  LYMPHATICS: No enlarged nodes. No history of splenectomy.  PSYCHIATRIC: No history of depression or anxiety.  ENDOCRINOLOGIC: No reports of  sweating, cold or heat intolerance. No polyuria or polydipsia.  Marland Kitchen   Physical Examination Today's Vitals   08/04/20 1355  BP: (!) 154/88  Pulse: 72  SpO2: 98%  Weight: 272 lb (123.4 kg)  Height: 5\' 8"  (1.727 m)   Body mass index is 41.36 kg/m.  Gen: resting comfortably, no acute distress HEENT: no scleral icterus, pupils equal round and reactive, no palptable cervical adenopathy,  CV: RRR, no m/r/g, no jvd Resp: Clear to auscultation bilaterally GI: abdomen is soft, non-tender, non-distended, normal bowel sounds, no hepatosplenomegaly MSK: extremities are warm, 1+ bilateral LE edema Skin: warm, no rash Neuro:  no focal deficits Psych: appropriate affect   Diagnostic Studies  11/2018 echo IMPRESSIONS   1. The left ventricle has normal systolic function, with an ejection fraction of 55-60%. The cavity size was normal. There is moderate concentric left ventricular hypertrophy. Left ventricular diastolic Doppler parameters are indeterminate secondary to  atrial fibrillation. No evidence of left ventricular regional wall motion abnormalities. 2. The right ventricle has normal systolic function. The cavity was normal. There is no increase in right ventricular wall thickness. 3. Left atrial size was moderate to severely dilated. 4. Right atrial size was mildly dilated. 5. There is mild mitral annular calcification present. 6. The tricuspid valve is grossly normal. 7. The aortic valve is tricuspid. 8. The aortic root is normal in size and structure.   Assessment and Plan  1. Persistent afib -EKG  today shows rate controlled afib - no symptoms, continue current meds   2. Leg edema/Venous insufficiency - overall stable, continue diuretic and compression stockings.   3. HTN elevated, unclear if related to white coat HTN. She will call end of week with home bp's.   F/u 6 months    12/2018, M.D.

## 2020-08-07 ENCOUNTER — Telehealth: Payer: Self-pay | Admitting: *Deleted

## 2020-08-07 NOTE — Telephone Encounter (Signed)
Patient informed and verbalized understanding of plan. 

## 2020-08-07 NOTE — Telephone Encounter (Signed)
Home BP readings per [facility] request at last visit  11/09/201 @6 :30 pm 128/60 08/05/2020 @6 :30 pm 132/63 08/06/2020 @8 :30 am 117/54 08/07/2020 @11 :30 am 121/59

## 2020-08-07 NOTE — Telephone Encounter (Signed)
BP's look good, no changes   J Erinne Gillentine MD 

## 2020-09-03 ENCOUNTER — Telehealth: Payer: Self-pay

## 2020-09-03 NOTE — Telephone Encounter (Signed)
90 day supply Eliquis mailed to this office.Patient aware and will pick up.

## 2020-09-03 NOTE — Telephone Encounter (Signed)
Update: Medication will be taken to [facility] office for patient to pick up.She plans on stopping by there tomorrow 09/04/20.

## 2020-09-15 ENCOUNTER — Other Ambulatory Visit: Payer: Self-pay | Admitting: *Deleted

## 2020-09-15 MED ORDER — APIXABAN 5 MG PO TABS: 5.0000 mg | ORAL_TABLET | Freq: Two times a day (BID) | ORAL | 3 refills | Status: DC

## 2020-09-16 ENCOUNTER — Telehealth: Payer: Self-pay | Admitting: Cardiology

## 2020-09-16 NOTE — Telephone Encounter (Signed)
Pt says she discovered knot on the inner right thigh that she noticed Sunday - not red or discolored - no pain - is moveable about the size of an orange and was concerned it may be a blood clot - pt says she doesn't have confidence in pcp so gave her [facility] phone # to get set up with pcp in [facility] - wanted Dr Verna Czech opinion on knot  - also cut her middle finger at the joint that took an hour to stop bleeding yesterday pt called urgent care who gave her instructions on bandaging and pressure which stopped the bleeding - says it did bleed yesterday as well but not as much and took 20 mins to stop with holding pressure with cotton ball and guaze - has not bleed a lot today

## 2020-09-16 NOTE — Telephone Encounter (Signed)
Patient called stating that on Monday  09/14/2020 she noticed a knot on the inner part of right leg. States has no color -slight pain at times.  Also, she cut her finger and she has had a hard time trying to stop the bleeding. Patient is concerned about being on Eliquis. Wants to know if this could be a blood clot.

## 2020-09-16 NOTE — Telephone Encounter (Signed)
The knot would be best evaluated by an internal medicine physician. Typically a blood clot would not present that ways, she is also on eliquis which would be highly preventative for any blood clots forming. Would not be concerned about a blood clot, a pcp would be best to sort it out.    Dominga Ferry MD

## 2020-09-16 NOTE — Telephone Encounter (Signed)
Pt voiced understanding and appreciative  

## 2020-10-15 ENCOUNTER — Ambulatory Visit (INDEPENDENT_AMBULATORY_CARE_PROVIDER_SITE_OTHER): Payer: Medicare Other | Admitting: General Surgery

## 2020-10-15 ENCOUNTER — Other Ambulatory Visit: Payer: Self-pay

## 2020-10-15 ENCOUNTER — Encounter: Payer: Self-pay | Admitting: General Surgery

## 2020-10-15 VITALS — BP 154/81 | HR 80 | Temp 97.5°F | Resp 16 | Ht 67.5 in | Wt 265.0 lb

## 2020-10-15 DIAGNOSIS — R2241 Localized swelling, mass and lump, right lower limb: Secondary | ICD-10-CM

## 2020-10-15 NOTE — Progress Notes (Signed)
[name]; [id number]; 12/15/1940   HPI Patient is an 80 year old white female who was referred to my care by Dr. [name] for evaluation and treatment of a right thigh mass.  Patient states she first noted the right thigh mass last month.  Is difficult to discern whether there is been any change in size.  She is on Eliquis for atrial fibrillation.  She denies any trauma to the right thigh.  No drainage has been noted. Past Medical History:  Diagnosis Date  . Anemia   . Anxiety   . Arthritis   . Back pain    reason unknown  . Cataract    bilateral immature  . Diarrhea   . Diverticulosis   . GERD (gastroesophageal reflux disease)    takes Omeprazole daily  . Headache(784.0)    occasionally  . Hiatal hernia   . History of blood transfusion    no abnormal reaction noted  . History of bronchitis 2014  . History of colon polyps   . History of shingles   . Hypertension    takes Metoprolol,Amlodipine and Losartan daily  . Internal hemorrhoids   . Joint pain   . Joint swelling   . Lung nodule   . Nocturia   . Numbness    hands occasionally  . Osteoarthritis   . Peripheral edema    takes HCTZ daily and Furosemide daily as needed  . PONV (postoperative nausea and vomiting)   . Redness    to both lower extremities  . Shortness of breath    WITH EXERTION   . Sleep apnea    doesn't use cpap  . Urinary frequency   . Urinary urgency   . Venous insufficiency     Past Surgical History:  Procedure Laterality Date  . ABDOMINAL HYSTERECTOMY  1983  . ANKLE SURGERY Left 1993  . ANTERIOR CRUCIATE LIGAMENT REPAIR Left 2013  . BREAST SURGERY     LEFT SIDE BIOPSY 1989  . CHOLECYSTECTOMY  1990  . COLONOSCOPY    . ENDOVENOUS ABLATION SAPHENOUS VEIN W/ LASER Right 04/17/2017   endovenous laser ablation R GSV by Josephina Gip MD  . ESOPHAGOGASTRODUODENOSCOPY    . HAND SURGERY Left 2011  . QUADRICEPS TENDON REPAIR  07/16/2012   Procedure: REPAIR QUADRICEP TENDON;  Surgeon:  Raymon Mutton, MD;  Location: [facility];  Service: Orthopedics;  Laterality: Left;  open repair of VMO   . quiadricp  10'21/2012  . 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 TUBAL LIGATION  1974  . vasculitic eruption of legs  1985    Family History  Problem Relation Age of Onset  . Lymphoma Father   . Leukemia Father   . Heart attack Father   . Hypertension Mother   . Emphysema Mother   . Heart disease Sister   . Rashes / Skin problems Sister   . Heart failure Sister   . Kidney disease Sister   . Heart disease Maternal Grandfather   . Heart disease Paternal Grandmother   . Heart disease Paternal Grandfather     Current Outpatient Medications on File Prior to Visit  Medication Sig  Dispense Refill  . acetaminophen (TYLENOL) 500 MG tablet Take 500 mg by mouth every 6 (six) hours as needed. For pain    . apixaban (ELIQUIS) 5 MG TABS tablet Take 1 tablet (5 mg total) by mouth 2 (two) times daily. 180 tablet 3  . Biotin 5000 MCG CAPS Take 1 capsule by mouth daily.    . Cholecalciferol (VITAMIN D3) 10000 units capsule Take 10,000 Units by mouth daily.    . furosemide (LASIX) 20 MG tablet TAKE 1 TABLET BY MOUTH EVERY DAY 90 tablet 1  . Glucosamine-Chondroitin (GLUCOSAMINE CHONDR COMPLEX PO) Take 1 capsule by mouth daily.     Marland Kitchen guaiFENesin (MUCINEX) 600 MG 12 hr tablet Take 600 mg by mouth 2 (two) times daily as needed for cough or to loosen phlegm.    . hydrochlorothiazide (HYDRODIURIL) 25 MG tablet hydrochlorothiazide 25 mg tablet  Take 1 tablet every day by oral route.    . irbesartan (AVAPRO) 150 MG tablet Take 150 mg by mouth daily.     . metoprolol tartrate (LOPRESSOR) 50 MG tablet TAKE 1 TABLET BY MOUTH TWICE A DAY 180 tablet 3  . Omega-3 Fatty Acids (OMEGA-3 FISH OIL PO) Take 1 capsule by mouth daily.    Marland Kitchen omeprazole (PRILOSEC) 20 MG capsule Take 20 mg by mouth daily.    . potassium gluconate 595 MG TABS tablet Take 1,190 mg by mouth daily.     Marland Kitchen triamcinolone lotion (KENALOG) 0.1 % Apply 1 application topically 3 (three) times daily.    . vitamin B-12 (CYANOCOBALAMIN) 1000 MCG tablet Take 1,000 mcg by mouth daily.     No current facility-administered medications on file prior to visit.    Allergies  Allergen Reactions  . Sulfa Antibiotics     Reaction unknown  . Adhesive [Tape] Other (See Comments) and Rash    Skin is very sensitive Skin is very sensitive Skin is very sensitive    Social History   Substance and Sexual Activity  Alcohol Use No  . Alcohol/week: 0.0 standard drinks    Social History   Tobacco Use  Smoking Status Never Smoker  Smokeless Tobacco Never Used    Review of Systems  Constitutional: Negative.   HENT: Positive for sinus pain.   Eyes: Negative.   Respiratory: Positive for shortness of breath.   Cardiovascular: Negative.   Gastrointestinal: Positive for abdominal pain, heartburn and nausea.  Genitourinary: Positive for frequency.  Musculoskeletal: Positive for back pain, joint pain and neck pain.  Skin: Negative.   Neurological: Negative.   Endo/Heme/Allergies: Bruises/bleeds easily.  Psychiatric/Behavioral: Negative.     Objective   Vitals:   10/15/20 1105  BP: (!) 154/81  Pulse: 80  Resp: 16  Temp: (!) 97.5 F (36.4 C)  SpO2: 98%    Physical Exam Vitals reviewed.  Constitutional:      Appearance: Normal appearance. She is obese. She is not ill-appearing.  HENT:     Head: Normocephalic and atraumatic.  Cardiovascular:     Rate and Rhythm: Normal rate. Rhythm irregular.     Heart sounds: No murmur heard. No gallop.   Pulmonary:     Effort: Pulmonary  effort is normal. No respiratory distress.     Breath sounds: Normal breath sounds. No stridor. No wheezing, rhonchi or rales.  Abdominal:     General: There is no distension.     Palpations: Abdomen is soft. There is no mass.     Tenderness: There is no abdominal tenderness. There is no  guarding or rebound.     Hernia: A hernia is present.     Comments: A lower midline 5 cm incisional hernia is present that is easily reducible.  This is above the umbilicus.  Musculoskeletal:     Comments: A greater than 6 cm in diameter ovoid subcutaneous rubbery mobile mass is present in the right inner thigh.  No skin changes are noted overlying it.  Skin:    General: Skin is dry.  Neurological:     Mental Status: She is alert.     Gait: Gait abnormal.     Assessment  Soft tissue mass, right thigh.  Lipoma is favored. Incisional hernia, asymptomatic.  This was found incidentally as the patient was wondering what the swelling was. Plan   Will get ultrasound of the right thigh soft tissue mass to further assess.  Further management is pending those results.

## 2020-10-15 NOTE — Patient Instructions (Signed)
Hiatal Hernia  A hiatal hernia occurs when part of the stomach slides above the muscle that separates the abdomen from the chest (diaphragm). A person can be born with a hiatal hernia (congenital), or it may develop over time. In almost all cases of hiatal hernia, only the top part of the stomach pushes through the diaphragm. Many people have a hiatal hernia with no symptoms. The larger the hernia, the more likely it is that you will have symptoms. In some cases, a hiatal hernia allows stomach acid to flow back into the tube that carries food from your mouth to your stomach (esophagus). This may cause heartburn symptoms. Severe heartburn symptoms may mean that you have developed a condition called gastroesophageal reflux disease (GERD). What are the causes? This condition is caused by a weakness in the opening (hiatus) where the esophagus passes through the diaphragm to attach to the upper part of the stomach. A person may be born with a weakness in the hiatus, or a weakness can develop over time. What increases the risk? This condition is more likely to develop in:  Older people. Age is a major risk factor for a hiatal hernia, especially if you are over the age of 50.  Pregnant women.  People who are overweight.  People who have frequent constipation. What are the signs or symptoms? Symptoms of this condition usually develop in the form of GERD symptoms. Symptoms include:  Heartburn.  Belching.  Indigestion.  Trouble swallowing.  Coughing or wheezing.  Sore throat.  Hoarseness.  Chest pain.  Nausea and vomiting. How is this diagnosed? This condition may be diagnosed during testing for GERD. Tests that may be done include:  X-rays of your stomach or chest.  An upper gastrointestinal (GI) series. This is an X-ray exam of your GI tract that is taken after you swallow a chalky liquid that shows up clearly on the X-ray.  Endoscopy. This is a procedure to look into your stomach  using a thin, flexible tube that has a tiny camera and light on the end of it. How is this treated? This condition may be treated by:  Dietary and lifestyle changes to help reduce GERD symptoms.  Medicines. These may include: ? Over-the-counter antacids. ? Medicines that make your stomach empty more quickly. ? Medicines that block the production of stomach acid (H2 blockers). ? Stronger medicines to reduce stomach acid (proton pump inhibitors).  Surgery to repair the hernia, if other treatments are not helping. If you have no symptoms, you may not need treatment. Follow these instructions at home: Lifestyle and activity  Do not use any products that contain nicotine or tobacco, such as cigarettes and e-cigarettes. If you need help quitting, ask your health care provider.  Try to achieve and maintain a healthy body weight.  Avoid putting pressure on your abdomen. Anything that puts pressure on your abdomen increases the amount of acid that may be pushed up into your esophagus. ? Avoid bending over, especially after eating. ? Raise the head of your bed by putting blocks under the legs. This keeps your head and esophagus higher than your stomach. ? Do not wear tight clothing around your chest or stomach. ? Try not to strain when having a bowel movement, when urinating, or when lifting heavy objects. Eating and drinking  Avoid foods that can worsen GERD symptoms. These may include: ? Fatty foods, like fried foods. ? Citrus fruits, like oranges or lemon. ? Other foods and drinks that contain acid, like   orange juice or tomatoes. ? Spicy food. ? Chocolate.  Eat frequent small meals instead of three large meals a day. This helps prevent your stomach from getting too full. ? Eat slowly. ? Do not lie down right after eating. ? Do not eat 1-2 hours before bed.  Do not drink beverages with caffeine. These include cola, coffee, cocoa, and tea.  Do not drink alcohol. General  instructions  Take over-the-counter and prescription medicines only as told by your health care provider.  Keep all follow-up visits as told by your health care provider. This is important. Contact a health care provider if:  Your symptoms are not controlled with medicines or lifestyle changes.  You are having trouble swallowing.  You have coughing or wheezing that will not go away. Get help right away if:  Your pain is getting worse.  Your pain spreads to your arms, neck, jaw, teeth, or back.  You have shortness of breath.  You sweat for no reason.  You feel sick to your stomach (nauseous) or you vomit.  You vomit blood.  You have bright red blood in your stools.  You have black, tarry stools. This information is not intended to replace advice given to you by your health care provider. Make sure you discuss any questions you have with your health care provider. Document Revised: 08/25/2017 Document Reviewed: 04/17/2017 Elsevier Patient Education  2021 [employer] Ventral Hernia  A ventral hernia is a bulge of tissue from inside the abdomen that pushes through a weak area of the muscles that form the front wall of the abdomen. The tissues inside the abdomen are inside a sac (peritoneum). These tissues include the small intestine, large intestine, and the fatty tissue that covers the intestines (omentum). Sometimes, the bulge that forms a hernia contains intestines. Other hernias contain only fat. Ventral hernias do not go away without surgical treatment. There are several types of ventral hernias. You may have:  A hernia at an incision site from previous abdominal surgery (incisional hernia).  A hernia just above the belly button (epigastric hernia), or at the belly button (umbilical hernia). These types of hernias can develop from heavy lifting or straining.  A hernia that comes and goes (reducible hernia). It may be visible only when you lift or strain. This type of  hernia can be pushed back into the abdomen (reduced).  A hernia that traps abdominal tissue inside the hernia (incarcerated hernia). This type of hernia does not reduce.  A hernia that cuts off blood flow to the tissues inside the hernia (strangulated hernia). The tissues can start to die if this happens. This is a very painful bulge that cannot be reduced. A strangulated hernia is a medical emergency. What are the causes? This condition is caused by abdominal tissue putting pressure on an area of weakness in the abdominal muscles. What increases the risk? The following factors may make you more likely to develop this condition:  Being age 45 or older.  Being overweight or obese.  Having had previous abdominal surgery, especially if there was an infection after surgery.  Having had an injury to the abdominal wall.  Frequently lifting or pushing heavy objects.  Having had several pregnancies.  Having a buildup of fluid inside the abdomen (ascites).  Straining to have a bowel movement or to urinate.  Having frequent coughing episodes. What are the signs or symptoms? The only symptom of a ventral hernia may be a painless bulge in the abdomen. A reducible hernia  may be visible only when you strain, cough, or lift. Other symptoms may include:  Dull pain.  A feeling of pressure. Signs and symptoms of a strangulated hernia may include:  Increasing pain.  Nausea and vomiting.  Pain when pressing on the hernia.  The skin over the hernia turning red or purple.  Constipation.  Blood in the stool (feces). How is this diagnosed? This condition may be diagnosed based on:  Your symptoms.  Your medical history.  A physical exam. You may be asked to cough or strain while standing. These actions increase the pressure inside your abdomen and force the hernia through the opening in your muscles. Your health care provider may try to reduce the hernia by gently pushing the hernia back  in.  Imaging studies, such as an ultrasound or CT scan. How is this treated? This condition is treated with surgery. If you have a strangulated hernia, surgery is done as soon as possible. If your hernia is small and not incarcerated, you may be asked to lose some weight before surgery. Follow these instructions at home:  Follow instructions from your health care provider about eating or drinking restrictions.  If you are overweight, your health care provider may recommend that you increase your activity level and eat a healthier diet.  Do not lift anything that is heavier than 10 lb (4.5 kg), or the limit that you are told, until your health care provider says that it is safe.  Return to your normal activities as told by your health care provider. Ask your health care provider what activities are safe for you. You may need to avoid activities that increase pressure on your hernia.  Take over-the-counter and prescription medicines only as told by your health care provider.  Keep all follow-up visits. This is important. Contact a health care provider if:  Your hernia gets larger.  Your hernia becomes painful. Get help right away if:  Your hernia becomes increasingly painful.  You have pain along with any of the following: ? Changes in skin color in the area of the hernia. ? Nausea. ? Vomiting. ? Fever. These symptoms may represent a serious problem that is an emergency. Do not wait to see if the symptoms will go away. Get medical help right away. Call your local emergency services (911 in the [place] Do not drive yourself to the hospital. Summary  A ventral hernia is a bulge of tissue from inside the abdomen that pushes through a weak area of the muscles that form the front wall of the abdomen.  This condition is treated with surgery, which may be urgent depending on your hernia.  Do not lift anything that is heavier than 10 lb (4.5 kg), and follow activity instructions from your  health care provider. This information is not intended to replace advice given to you by your health care provider. Make sure you discuss any questions you have with your health care provider. Document Revised: 05/01/2020 Document Reviewed: 05/01/2020 Elsevier Patient Education  2021 [employer] Inc. Lipoma  A lipoma is a noncancerous (benign) tumor that is made up of fat cells. This is a very common type of soft-tissue growth. Lipomas are usually found under the skin (subcutaneous). They may occur in any tissue of the body that contains fat. Common areas for lipomas to appear include the back, arms, shoulders, buttocks, and thighs. Lipomas grow slowly, and they are usually painless. Most lipomas do not cause problems and do not require treatment. What are the causes?  The cause of this condition is not known. What increases the risk? You are more likely to develop this condition if:  You are 79-3 years old.  You have a family history of lipomas. What are the signs or symptoms? A lipoma usually appears as a small, round bump under the skin. In most cases, the lump will:  Feel soft or rubbery.  Not cause pain or other symptoms. However, if a lipoma is located in an area where it pushes on nerves, it can become painful or cause other symptoms. How is this diagnosed? A lipoma can usually be diagnosed with a physical exam. You may also have tests to confirm the diagnosis and to rule out other conditions. Tests may include:  Imaging tests, such as a CT scan or an MRI.  Removal of a tissue sample to be looked at under a microscope (biopsy). How is this treated? Treatment for this condition depends on the size of the lipoma and whether it is causing any symptoms.  For small lipomas that are not causing problems, no treatment is needed.  If a lipoma is bigger or it causes problems, surgery may be done to remove the lipoma. Lipomas can also be removed to improve appearance. Most often, the  procedure is done after applying a medicine that numbs the area (local anesthetic).  Liposuction may be done to reduce the size of the lipoma before it is removed through surgery, or it may be done to remove the lipoma. Lipomas are removed with this method in order to limit incision size and scarring. A liposuction tube is inserted through a small incision into the lipoma, and the contents of the lipoma are removed through the tube with suction. Follow these instructions at home:  Watch your lipoma for any changes.  Keep all follow-up visits as told by your health care provider. This is important. Contact a health care provider if:  Your lipoma becomes larger or hard.  Your lipoma becomes painful, red, or increasingly swollen. These could be signs of infection or a more serious condition. Get help right away if:  You develop tingling or numbness in an area near the lipoma. This could indicate that the lipoma is causing nerve damage. Summary  A lipoma is a noncancerous tumor that is made up of fat cells.  Most lipomas do not cause problems and do not require treatment.  If a lipoma is bigger or it causes problems, surgery may be done to remove the lipoma.  Contact a health care provider if your lipoma becomes larger or hard, or if it becomes painful, red, or increasingly swollen. Pain, redness, and swelling could be signs of infection or a more serious condition. This information is not intended to replace advice given to you by your health care provider. Make sure you discuss any questions you have with your health care provider. Document Revised: 04/29/2019 Document Reviewed: 04/29/2019 Elsevier Patient Education  2021 [employer].

## 2020-10-21 ENCOUNTER — Other Ambulatory Visit: Payer: Self-pay

## 2020-10-21 ENCOUNTER — Ambulatory Visit (HOSPITAL_COMMUNITY)
Admission: RE | Admit: 2020-10-21 | Discharge: 2020-10-21 | Disposition: A | Discharge status: Home / Self Care | Payer: Medicare Other | Source: Ambulatory Visit | Attending: General Surgery | Admitting: General Surgery

## 2020-10-21 DIAGNOSIS — R2241 Localized swelling, mass and lump, right lower limb: Secondary | ICD-10-CM | POA: Diagnosis present

## 2020-10-27 ENCOUNTER — Ambulatory Visit (INDEPENDENT_AMBULATORY_CARE_PROVIDER_SITE_OTHER): Payer: Medicare Other | Admitting: General Surgery

## 2020-10-27 ENCOUNTER — Encounter: Payer: Self-pay | Admitting: General Surgery

## 2020-10-27 ENCOUNTER — Other Ambulatory Visit: Payer: Self-pay

## 2020-10-27 ENCOUNTER — Telehealth: Payer: Self-pay | Admitting: Cardiology

## 2020-10-27 VITALS — BP 150/80 | HR 71 | Temp 96.8°F | Resp 12 | Ht 68.0 in | Wt 265.0 lb

## 2020-10-27 DIAGNOSIS — I4819 Other persistent atrial fibrillation: Secondary | ICD-10-CM

## 2020-10-27 DIAGNOSIS — S8011XD Contusion of right lower leg, subsequent encounter: Secondary | ICD-10-CM

## 2020-10-27 NOTE — Telephone Encounter (Signed)
Patient called to check on her application for Eliquis. She also wants to speak with someone about something that is showing up in her My Chart.

## 2020-10-27 NOTE — Progress Notes (Signed)
Subjective:     [name]  Here for follow-up of ultrasound of right thigh.  Patient was told that this appears to be a resolving hematoma.  No masses seen.  She does not know whether it has changed in size significantly since she first sought in December 2021.  Patient denies any fevers or drainage from the mass. Objective:    BP (!) 150/80   Pulse 71   Temp (!) 96.8 F (36 C) (Other (Comment))   Resp 12   Ht 5\' 8"  (1.727 m)   Wt 265 lb (120.2 kg)   LMP 02/07/2012   SpO2 98%   BMI 40.29 kg/m   General:  alert, cooperative and no distress  Right inner thigh mass still present and somewhat soft.     Assessment:    Cystic mass, right thigh.  Possible resolving hematoma.    Plan:   I told the patient that I would not recommend surgical intervention at this time.  She understands.  If she is still concerned about the mass or it seems to be enlarging in size, she was told to call my office and we will schedule a follow-up ultrasound.  Follow-up as needed.  I told her I would wait to do any further imaging for at least 2 to 3 months.

## 2020-10-27 NOTE — Telephone Encounter (Signed)
Called [employer] who says pt application is still in process (made them aware that application was sent 09/15/2020 and this is going on 2 months and asked for expedited review) pt still has Eliquis samples - pt also requests Dr Wyline Mood to review her recent lowe extremity US that was ordered by Dr Lovell Sheehan and if she should be on a lower dose of Eliquis

## 2020-10-29 NOTE — Telephone Encounter (Signed)
She is on the appropriate dose of eliquis, would not lower the dose. Reviewed [place] of mass, unclear exactly whta it is. If enlarging there may be some increased concerns about a possible hematoma and may have to hold eliquis for a while. Has the mass grown in size?   Dominga Ferry MD

## 2020-10-30 NOTE — Telephone Encounter (Signed)
Pt says Dr Lovell Sheehan told her it was a hematoma and that Dr Lovell Sheehan anticipates this will resolve on its own, plans for a repeat US in April as this was the first [place] she had had of mass so no way to know if it has grown in size. Pt wanted to make sure that this mass/hematoma wouldn't get more aggravated by being on Eliquis and if she needed to be seen by [place] before scheduled appt in May - pt also wanted noted that she has lost weight and is at 260lbs

## 2020-11-02 NOTE — Telephone Encounter (Signed)
I would continue eliquis, check a cbc later this week to make sure Hgb is not dropping to suggest any ongoing bleeding.    Dominga Ferry MD

## 2020-11-02 NOTE — Telephone Encounter (Signed)
Pt aware and will have labs done at [facility] on Thursday - orders placed

## 2020-11-05 LAB — CBC
HCT: 38.9 % (ref 35.0–45.0)
Hemoglobin: 12.4 g/dL (ref 11.7–15.5)
MCH: 27.8 pg (ref 27.0–33.0)
MCHC: 31.9 g/dL — ABNORMAL LOW (ref 32.0–36.0)
MCV: 87.2 fL (ref 80.0–100.0)
MPV: 9.8 fL (ref 7.5–12.5)
Platelets: 200 10*3/uL (ref 140–400)
RBC: 4.46 10*6/uL (ref 3.80–5.10)
RDW: 14.1 % (ref 11.0–15.0)
WBC: 7.1 10*3/uL (ref 3.8–10.8)

## 2020-11-10 ENCOUNTER — Telehealth: Payer: Self-pay | Admitting: *Deleted

## 2020-11-10 NOTE — Telephone Encounter (Signed)
Pt voiced understanding -pt wanted paper copy of lab results and will come by office next week to pick up copy of results

## 2020-11-10 NOTE — Telephone Encounter (Signed)
-----   Message from Antoine Poche, MD sent at 11/10/2020  7:28 AM EST ----- Normal and stable hemoglobin which would indicate there is no significant ongoing bleeding, continue eliquis  Dominga Ferry MD

## 2020-11-10 NOTE — Telephone Encounter (Signed)
Patient returning call to [facility] for lab results

## 2020-11-11 ENCOUNTER — Telehealth: Payer: Self-pay | Admitting: *Deleted

## 2020-11-11 NOTE — Telephone Encounter (Signed)
Pt approved for the [employer] patient assistance foundation through 09/25/20

## 2020-11-19 ENCOUNTER — Telehealth: Payer: Self-pay | Admitting: *Deleted

## 2020-11-19 NOTE — Telephone Encounter (Signed)
Pt approved for [employer] through 09/15/2021 application sent for scanning - pt made aware and appreciative

## 2020-12-06 ENCOUNTER — Other Ambulatory Visit: Payer: Self-pay | Admitting: Cardiology

## 2021-01-04 ENCOUNTER — Other Ambulatory Visit: Payer: Self-pay | Admitting: Cardiology

## 2021-01-22 ENCOUNTER — Telehealth: Payer: Self-pay | Admitting: Family Medicine

## 2021-01-22 ENCOUNTER — Other Ambulatory Visit: Payer: Self-pay | Admitting: Family Medicine

## 2021-01-22 DIAGNOSIS — R2241 Localized swelling, mass and lump, right lower limb: Secondary | ICD-10-CM

## 2021-01-22 NOTE — Telephone Encounter (Signed)
Pt called and states that the mass on her thigh is no better and would like to have a f/u ultrasound that Dr. Lovell Sheehan said she could have if no better. Per LOV ordered US, scheduled and pt made aware.

## 2021-01-28 ENCOUNTER — Telehealth: Payer: Self-pay | Admitting: Cardiology

## 2021-01-28 MED ORDER — METOPROLOL TARTRATE 50 MG PO TABS: 50.0000 mg | ORAL_TABLET | Freq: Two times a day (BID) | ORAL | 1 refills | Status: DC

## 2021-01-28 NOTE — Telephone Encounter (Signed)
Medication sent to pharmacy  

## 2021-01-28 NOTE — Telephone Encounter (Signed)
Pt has lost her new bottle of metoprolol tartrate (LOPRESSOR) 50 MG tablet [[id number]]  And doesn't know what to do. She does not have enough to last till she see's Dr. Wyline Mood on 5/10

## 2021-01-29 ENCOUNTER — Other Ambulatory Visit: Payer: Self-pay

## 2021-01-29 ENCOUNTER — Ambulatory Visit (HOSPITAL_COMMUNITY)
Admission: RE | Admit: 2021-01-29 | Discharge: 2021-01-29 | Disposition: A | Discharge status: Home / Self Care | Payer: Medicare Other | Source: Ambulatory Visit | Attending: General Surgery | Admitting: General Surgery

## 2021-01-29 DIAGNOSIS — R2241 Localized swelling, mass and lump, right lower limb: Secondary | ICD-10-CM | POA: Insufficient documentation

## 2021-02-02 ENCOUNTER — Encounter: Payer: Self-pay | Admitting: Cardiology

## 2021-02-02 ENCOUNTER — Other Ambulatory Visit: Payer: Self-pay | Admitting: General Surgery

## 2021-02-02 ENCOUNTER — Other Ambulatory Visit: Payer: Self-pay | Admitting: Cardiology

## 2021-02-02 ENCOUNTER — Ambulatory Visit (INDEPENDENT_AMBULATORY_CARE_PROVIDER_SITE_OTHER): Payer: Medicare Other | Admitting: Cardiology

## 2021-02-02 VITALS — BP 174/84 | HR 84 | Ht 68.0 in | Wt 271.8 lb

## 2021-02-02 DIAGNOSIS — I4819 Other persistent atrial fibrillation: Secondary | ICD-10-CM | POA: Diagnosis not present

## 2021-02-02 DIAGNOSIS — I872 Venous insufficiency (chronic) (peripheral): Secondary | ICD-10-CM

## 2021-02-02 DIAGNOSIS — I10 Essential (primary) hypertension: Secondary | ICD-10-CM | POA: Diagnosis not present

## 2021-02-02 DIAGNOSIS — R2241 Localized swelling, mass and lump, right lower limb: Secondary | ICD-10-CM

## 2021-02-02 MED ORDER — METOPROLOL TARTRATE 50 MG PO TABS: 50.0000 mg | ORAL_TABLET | Freq: Two times a day (BID) | ORAL | 1 refills | Status: DC

## 2021-02-02 NOTE — Progress Notes (Signed)
Clinical Summary [name] is a 80 y.o.female seen today for follow up of the following medical problems.   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  - she has turned down DCCV.   - no recent palpitations - compliant with meds  2. HTN - does not check at home. - reports some white coat HTN in the past  - home bp's 120s-130s/55-65  3. OSA - not usingacpap, has not been interested in     4. Chronic venous insufficiency - followed by vascular Dr Hart [place] - swelling up and down.  - previously seen in wound clinic.  - wears compression stockings  - weight down to 217from 306 last visit. Reports significant dieatary changes - swelling improving in the legs - has compression socks  - overall edema is stable.   - has not taken HCTZ, takes lasix daily. - chronic swellng unchanged  5. Large complex cystic mass - noted by [place] medial right thigh, stable from prior US 4 months ago - normal Hgb on check, we have continued anticoag. There was some question if this could be a hematoma    SH: her significant other is [name] who is also a patient of minewho passed away 2019/11/22   Past Medical History:  Diagnosis Date  . Anemia   . Anxiety   . Arthritis   . Back pain    reason unknown  . Cataract    bilateral immature  . Diarrhea   . Diverticulosis   . GERD (gastroesophageal reflux disease)    takes Omeprazole daily  . Headache(784.0)    occasionally  . Hiatal hernia   . History of blood transfusion    no abnormal reaction noted  . History of bronchitis 2014  . History of colon polyps   . History of shingles   . Hypertension    takes Metoprolol,Amlodipine and Losartan daily  . Internal hemorrhoids   . Joint pain   . Joint swelling   . Lung nodule   . Nocturia   . Numbness    hands occasionally  . Osteoarthritis   . Peripheral edema    takes HCTZ  daily and Furosemide daily as needed  . PONV (postoperative nausea and vomiting)   . Redness    to both lower extremities  . Shortness of breath    WITH EXERTION   . Sleep apnea    doesn't use cpap  . Urinary frequency   . Urinary urgency   . Venous insufficiency      Allergies  Allergen Reactions  . Sulfa Antibiotics     Reaction unknown  . Adhesive [Tape] Other (See Comments) and Rash    Skin is very sensitive Skin is very sensitive Skin is very sensitive     Current Outpatient Medications  Medication Sig Dispense Refill  . acetaminophen (TYLENOL) 500 MG tablet Take 500 mg by mouth every 6 (six) hours as needed. For pain    . apixaban (ELIQUIS) 5 MG TABS tablet Take 1 tablet (5 mg total) by mouth 2 (two) times daily. 180 tablet 3  . Biotin 5000 MCG CAPS Take 1 capsule by mouth daily.    . Cholecalciferol (VITAMIN D3) 10000 units capsule Take 10,000 Units by mouth daily.    . furosemide (LASIX) 20 MG tablet TAKE 1 TABLET BY MOUTH EVERY DAY 90 tablet 2  . Glucosamine-Chondroitin (GLUCOSAMINE CHONDR COMPLEX PO) Take 1  capsule by mouth daily.     Marland Kitchen guaiFENesin (MUCINEX) 600 MG 12 hr tablet Take 600 mg by mouth 2 (two) times daily as needed for cough or to loosen phlegm.    . hydrochlorothiazide (HYDRODIURIL) 25 MG tablet hydrochlorothiazide 25 mg tablet  Take 1 tablet every day by oral route.    . irbesartan (AVAPRO) 150 MG tablet Take 150 mg by mouth daily.    . metoprolol tartrate (LOPRESSOR) 50 MG tablet Take 1 tablet (50 mg total) by mouth 2 (two) times daily. 180 tablet 1  . Omega-3 Fatty Acids (OMEGA-3 FISH OIL PO) Take 1 capsule by mouth daily.    Marland Kitchen omeprazole (PRILOSEC) 20 MG capsule Take 20 mg by mouth daily.    . potassium gluconate 595 MG TABS tablet Take 1,190 mg by mouth daily.     Marland Kitchen triamcinolone lotion (KENALOG) 0.1 % Apply 1 application topically 3 (three) times daily.    . vitamin B-12 (CYANOCOBALAMIN) 1000 MCG tablet Take 1,000 mcg by mouth daily.     No  current facility-administered medications for this visit.     Past Surgical History:  Procedure Laterality Date  . ABDOMINAL HYSTERECTOMY  1983  . ANKLE SURGERY Left 1993  . ANTERIOR CRUCIATE LIGAMENT REPAIR Left 2013  . BREAST SURGERY     LEFT SIDE BIOPSY 1989  . CHOLECYSTECTOMY  1990  . COLONOSCOPY    . ENDOVENOUS ABLATION SAPHENOUS VEIN W/ LASER Right 04/17/2017   endovenous laser ablation R GSV by Josephina Gip MD  . ESOPHAGOGASTRODUODENOSCOPY    . HAND SURGERY Left 2011  . QUADRICEPS TENDON REPAIR  07/16/2012   Procedure: REPAIR QUADRICEP TENDON;  Surgeon: Raymon Mutton, MD;  Location: [facility] OR;  Service: Orthopedics;  Laterality: Left;  open repair of VMO   . quiadricp  10'21/2012  . 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 TUBAL LIGATION  1974  . vasculitic eruption of legs  1985     Allergies  Allergen Reactions  . Sulfa Antibiotics     Reaction unknown  . Adhesive [Tape] Other (See Comments) and Rash    Skin is very sensitive Skin is very sensitive Skin is very sensitive      Family History  Problem Relation Age of Onset  . Lymphoma Father   . Leukemia Father   . Heart attack Father   . Hypertension Mother   . Emphysema Mother   . Heart disease Sister   . Rashes / Skin problems Sister   . Heart failure Sister   . Kidney disease Sister   . Heart disease Maternal Grandfather   . Heart disease Paternal Grandmother   . Heart disease Paternal Grandfather      Social History Ms. Mortensen reports that she has never smoked. She has never used smokeless tobacco. Ms. Buckles reports no history of alcohol  use.   Review of Systems CONSTITUTIONAL: No weight loss, fever, chills, weakness or fatigue.  HEENT: Eyes: No visual loss, blurred vision, double vision or yellow sclerae.No hearing loss, sneezing, congestion, runny nose or sore throat.  SKIN: No rash or itching.  CARDIOVASCULAR: per hpi RESPIRATORY: No  shortness of breath, cough or sputum.  GASTROINTESTINAL: No anorexia, nausea, vomiting or diarrhea. No abdominal pain or blood.  GENITOURINARY: No burning on urination, no polyuria NEUROLOGICAL: No headache, dizziness, syncope, paralysis, ataxia, numbness or tingling in the extremities. No change in bowel or bladder control.  MUSCULOSKELETAL: No muscle, back pain, joint pain or stiffness.  LYMPHATICS: No enlarged nodes. No history of splenectomy.  PSYCHIATRIC: No history of depression or anxiety.  ENDOCRINOLOGIC: No reports of sweating, cold or heat intolerance. No polyuria or polydipsia.  Marland Kitchen   Physical Examination Today's Vitals   02/02/21 1337  BP: (!) 174/84  Pulse: 84  SpO2: 98%  Weight: 271 lb 12.8 oz (123.3 kg)  Height: 5\' 8"  (1.727 m)   Body mass index is 41.33 kg/m.  Gen: resting comfortably, no acute distress HEENT: no scleral icterus, pupils equal round and reactive, no palptable cervical adenopathy,  CV: RRR, no m/r/g, no jvd Resp: Clear to auscultation bilaterally GI: abdomen is soft, non-tender, non-distended, normal bowel sounds, no hepatosplenomegaly MSK: extremities are warm, no edema.  Skin: warm, no rash Neuro:  no focal deficits Psych: appropriate affect   Diagnostic Studies  11/2018 echo IMPRESSIONS   1. The left ventricle has normal systolic function, with an ejection fraction of 55-60%. The cavity size was normal. There is moderate concentric left ventricular hypertrophy. Left ventricular diastolic Doppler parameters are indeterminate secondary to  atrial fibrillation. No evidence of left ventricular regional wall motion abnormalities. 2. The  right ventricle has normal systolic function. The cavity was normal. There is no increase in right ventricular wall thickness. 3. Left atrial size was moderate to severely dilated. 4. Right atrial size was mildly dilated. 5. There is mild mitral annular calcification present. 6. The tricuspid valve is grossly normal. 7. The aortic valve is tricuspid. 8. The aortic root is normal in size and structure.   Assessment and Plan   1. Persistent afib -no recent symptoms, continue current meds   2. Leg edema/Venous insufficiency -swelling is stable, continue diuretic and compression  3. HTN elevated in clinic but home number at goal, continue current meds    12/2018, M.D.

## 2021-02-02 NOTE — Patient Instructions (Signed)
Your physician recommends that you schedule a follow-up appointment in: 6 MONTHS WITH DR BRANCH  Your physician recommends that you continue on your current medications as directed. Please refer to the Current Medication list given to you today.  Thank you for choosing [facility]!!    

## 2021-02-02 NOTE — Progress Notes (Unsigned)
Ordered MRI with/without contrast of right thigh due to recommendation from follow up U/S.

## 2021-02-26 ENCOUNTER — Ambulatory Visit (HOSPITAL_COMMUNITY)
Admission: RE | Admit: 2021-02-26 | Discharge: 2021-02-26 | Disposition: A | Discharge status: Home / Self Care | Payer: Medicare Other | Source: Ambulatory Visit | Attending: General Surgery | Admitting: General Surgery

## 2021-02-26 DIAGNOSIS — R2241 Localized swelling, mass and lump, right lower limb: Secondary | ICD-10-CM | POA: Diagnosis present

## 2021-02-26 MED ORDER — GADOBUTROL 1 MMOL/ML IV SOLN
10.0000 mL | Freq: Once | INTRAVENOUS | Status: AC | PRN
  Administered 2021-02-26: 10 mL via INTRAVENOUS

## 2021-07-22 DIAGNOSIS — Z8616 Personal history of COVID-19: Secondary | ICD-10-CM | POA: Insufficient documentation

## 2021-08-10 ENCOUNTER — Ambulatory Visit: Payer: Medicare Other | Admitting: Cardiology

## 2021-08-11 ENCOUNTER — Ambulatory Visit (INDEPENDENT_AMBULATORY_CARE_PROVIDER_SITE_OTHER): Payer: Medicare Other | Admitting: Cardiology

## 2021-08-11 ENCOUNTER — Encounter: Payer: Self-pay | Admitting: Cardiology

## 2021-08-11 VITALS — BP 146/94 | HR 90 | Ht 68.0 in | Wt 288.6 lb

## 2021-08-11 DIAGNOSIS — I10 Essential (primary) hypertension: Secondary | ICD-10-CM | POA: Diagnosis not present

## 2021-08-11 DIAGNOSIS — I4819 Other persistent atrial fibrillation: Secondary | ICD-10-CM | POA: Diagnosis not present

## 2021-08-11 DIAGNOSIS — I872 Venous insufficiency (chronic) (peripheral): Secondary | ICD-10-CM | POA: Diagnosis not present

## 2021-08-11 NOTE — Progress Notes (Signed)
Clinical Summary [name] is a 80 y.o.femaleseen today for follow up of the following medical problems.      1. 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    -compliant with meds - no recent palpiations - no recent bleeding on eliquis   2. HTN - reports some white coat HTN in the past    Home bp's 120s/70s - she is on prednisone for URI    3. OSA - not using a cpap, has not been interested in         4. Chronic venous insufficiency - followed by vascular Dr Hart [place] - swelling up and down.  - previously seen in wound clinic.  - wears compression stockings   - weight down to 276 from 306 last visit. Reports significant dieatary changes - swelling improving in the legs - has compression socks    - overall controlled with lasix.     5. Large complex cystic mass - noted by [place] medial right thigh, stable from prior US 4 months ago - normal Hgb on check, we have continued anticoag. There was some question if this could be a hematoma    6. URI - on zpack, steroids, albuterol per pcp   SH: her significant other is [name] who is also a patient of mine who passed away November 22, 2019   Past Medical History:  Diagnosis Date   Anemia    Anxiety    Arthritis    Back pain    reason unknown   Cataract    bilateral immature   Diarrhea    Diverticulosis    GERD (gastroesophageal reflux disease)    takes Omeprazole daily   Headache(784.0)    occasionally   Hiatal hernia    History of blood transfusion    no abnormal reaction noted   History of bronchitis 2014   History of colon polyps    History of shingles    Hypertension    takes Metoprolol,Amlodipine and Losartan daily   Internal hemorrhoids    Joint pain    Joint swelling    Lung nodule    Nocturia    Numbness    hands occasionally   Osteoarthritis    Peripheral edema    takes HCTZ daily and Furosemide daily as needed   PONV (postoperative nausea and vomiting)    Redness     to both lower extremities   Shortness of breath    WITH EXERTION    Sleep apnea    doesn't use cpap   Urinary frequency    Urinary urgency    Venous insufficiency      Allergies  Allergen Reactions   Sulfa Antibiotics     Reaction unknown   Adhesive [Tape] Other (See Comments) and Rash    Skin is very sensitive Skin is very sensitive Skin is very sensitive     Current Outpatient Medications  Medication Sig Dispense Refill   acetaminophen (TYLENOL) 500 MG tablet Take 500 mg by mouth every 6 (six) hours as needed. For pain     apixaban (ELIQUIS) 5 MG TABS tablet Take 1 tablet (5 mg total) by mouth 2 (two) times daily. 180 tablet 3   Biotin 5000 MCG CAPS Take 1 capsule by mouth daily.     Cholecalciferol (VITAMIN D3) 10000 units capsule Take 10,000 Units by mouth daily.     furosemide (LASIX) 20 MG tablet TAKE 1 TABLET BY MOUTH EVERY DAY 90 tablet  2   Glucosamine-Chondroitin (GLUCOSAMINE CHONDR COMPLEX PO) Take 1 capsule by mouth daily.      guaiFENesin (MUCINEX) 600 MG 12 hr tablet Take 600 mg by mouth 2 (two) times daily as needed for cough or to loosen phlegm.     hydrochlorothiazide (HYDRODIURIL) 25 MG tablet hydrochlorothiazide 25 mg tablet  Take 1 tablet every day by oral route.     irbesartan (AVAPRO) 150 MG tablet Take 150 mg by mouth daily.     metoprolol tartrate (LOPRESSOR) 50 MG tablet TAKE 1 TABLET BY MOUTH TWICE A DAY 180 tablet 1   Omega-3 Fatty Acids (OMEGA-3 FISH OIL PO) Take 1 capsule by mouth daily.     omeprazole (PRILOSEC) 20 MG capsule Take 20 mg by mouth daily.     potassium gluconate 595 MG TABS tablet Take 1,190 mg by mouth daily.      triamcinolone lotion (KENALOG) 0.1 % Apply 1 application topically 3 (three) times daily.     vitamin B-12 (CYANOCOBALAMIN) 1000 MCG tablet Take 1,000 mcg by mouth daily.     No current facility-administered medications for this visit.     Past Surgical History:  Procedure Laterality Date   ABDOMINAL HYSTERECTOMY   1983   ANKLE SURGERY Left 1993   ANTERIOR CRUCIATE LIGAMENT REPAIR Left 2013   BREAST SURGERY     LEFT SIDE BIOPSY 1989   CHOLECYSTECTOMY  1990   COLONOSCOPY     ENDOVENOUS ABLATION SAPHENOUS VEIN W/ LASER Right 04/17/2017   endovenous laser ablation R GSV by Josephina Gip MD   ESOPHAGOGASTRODUODENOSCOPY     HAND SURGERY Left 2011   QUADRICEPS TENDON REPAIR  07/16/2012   Procedure: REPAIR QUADRICEP TENDON;  Surgeon: Raymon Mutton, MD;  Location: [facility] OR;  Service: Orthopedics;  Laterality: Left;  open repair of VMO    quiadricp  10'21/2012   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   Procedure: TOTAL KNEE ARTHROPLASTY;  Surgeon: Raymon Mutton, MD;  Location: [facility] OR;  Service: Orthopedics;  Laterality: Left;   TOTAL KNEE ARTHROPLASTY Right 11/04/2013   DR Sherlean Foot   TOTAL KNEE ARTHROPLASTY Right 11/04/2013   Procedure: RIGHT TOTAL KNEE ARTHROPLASTY;  Surgeon: Dannielle Huh, MD;  Location: [facility] OR;  Service: Orthopedics;  Laterality: Right;   TUBAL LIGATION  1974   vasculitic eruption of legs  1985     Allergies  Allergen Reactions   Sulfa Antibiotics     Reaction unknown   Adhesive [Tape] Other (See Comments) and Rash    Skin is very sensitive Skin is very sensitive Skin is very sensitive      Family History  Problem Relation Age of Onset   Lymphoma Father    Leukemia Father    Heart attack Father    Hypertension Mother    Emphysema Mother    Heart disease Sister    Rashes / Skin problems Sister    Heart failure Sister    Kidney disease Sister    Heart disease Maternal Grandfather    Heart disease Paternal Grandmother    Heart disease Paternal Grandfather      Social History [name] reports that she has never smoked. She has never used smokeless tobacco. [name] reports no history of alcohol use.   Review of Systems CONSTITUTIONAL: No weight loss, fever, chills, weakness or fatigue.  HEENT: Eyes: No visual loss, blurred vision, double vision or yellow sclerae.No  hearing loss, sneezing, congestion, runny nose or sore throat.  SKIN: No rash or itching.  CARDIOVASCULAR: per hpi RESPIRATORY: No shortness of breath, cough or sputum.  GASTROINTESTINAL: No anorexia, nausea, vomiting or diarrhea. No abdominal pain or blood.  GENITOURINARY: No burning on urination, no polyuria NEUROLOGICAL: No headache, dizziness, syncope, paralysis, ataxia, numbness or tingling in the extremities. No change in bowel or bladder control.  MUSCULOSKELETAL: No muscle, back pain, joint pain or stiffness.  LYMPHATICS: No enlarged nodes. No history of splenectomy.  PSYCHIATRIC: No history of depression or anxiety.  ENDOCRINOLOGIC: No reports of sweating, cold or heat intolerance. No polyuria or polydipsia.  Marland Kitchen   Physical Examination Today's Vitals   08/11/21 1441  BP: (!) 146/94  Pulse: 90  SpO2: 98%  Weight: 288 lb 9.6 oz (130.9 kg)  Height: 5\' 8"  (1.727 m)   Body mass index is 43.88 kg/m.  Gen: resting comfortably, no acute distress HEENT: no scleral icterus, pupils equal round and reactive, no palptable cervical adenopathy,  CV: irreg, no m/r/ gno jvd Resp: bilateral wheezing GI: abdomen is soft, non-tender, non-distended, normal bowel sounds, no hepatosplenomegaly MSK: extremities are warm, no edema.  Skin: warm, no rash Neuro:  no focal deficits Psych: appropriate affect   Diagnostic Studies  11/2018 echo IMPRESSIONS      1. The left ventricle has normal systolic function, with an ejection fraction of 55-60%. The cavity size was normal. There is moderate concentric left ventricular hypertrophy. Left ventricular diastolic Doppler parameters are indeterminate secondary to  atrial fibrillation. No evidence of left ventricular regional wall motion abnormalities.  2. The right ventricle has normal systolic function. The cavity was normal. There is no increase in right ventricular wall thickness.  3. Left atrial size was moderate to severely dilated.  4. Right  atrial size was mildly dilated.  5. There is mild mitral annular calcification present.  6. The tricuspid valve is grossly normal.  7. The aortic valve is tricuspid.  8. The aortic root is normal in size and structure.   Assessment and Plan  1. Persistent afib -no symptoms, continue current meds     2. Leg edema/Venous insufficiency -controlled, continue lasix   3. HTN  elevated in clinic, home numbers at goal, which is her normal pattern - continue current meds     12/2018, M.D.

## 2021-08-11 NOTE — Patient Instructions (Signed)

## 2021-08-23 ENCOUNTER — Other Ambulatory Visit: Payer: Self-pay | Admitting: Cardiology

## 2021-09-01 ENCOUNTER — Ambulatory Visit: Payer: Medicare Other | Admitting: Student

## 2021-10-13 ENCOUNTER — Other Ambulatory Visit: Payer: Self-pay | Admitting: *Deleted

## 2021-10-13 MED ORDER — APIXABAN 5 MG PO TABS: 5.0000 mg | ORAL_TABLET | Freq: Two times a day (BID) | ORAL | 3 refills | Status: DC

## 2021-10-17 ENCOUNTER — Other Ambulatory Visit: Payer: Self-pay | Admitting: Cardiology

## 2021-10-26 ENCOUNTER — Ambulatory Visit (INDEPENDENT_AMBULATORY_CARE_PROVIDER_SITE_OTHER): Payer: Medicare Other | Admitting: Pulmonary Disease

## 2021-10-26 ENCOUNTER — Other Ambulatory Visit: Payer: Self-pay

## 2021-10-26 ENCOUNTER — Encounter: Payer: Self-pay | Admitting: Pulmonary Disease

## 2021-10-26 ENCOUNTER — Ambulatory Visit (INDEPENDENT_AMBULATORY_CARE_PROVIDER_SITE_OTHER): Payer: Medicare Other

## 2021-10-26 VITALS — BP 128/70 | HR 85 | Temp 98.5°F | Ht 68.0 in | Wt 288.0 lb

## 2021-10-26 DIAGNOSIS — R0609 Other forms of dyspnea: Secondary | ICD-10-CM

## 2021-10-26 DIAGNOSIS — R140 Abdominal distension (gaseous): Secondary | ICD-10-CM | POA: Diagnosis not present

## 2021-10-26 MED ORDER — BUDESONIDE-FORMOTEROL FUMARATE 160-4.5 MCG/ACT IN AERO: 2.0000 | INHALATION_SPRAY | Freq: Two times a day (BID) | RESPIRATORY_TRACT | 11 refills | Status: DC

## 2021-10-26 MED ORDER — ALBUTEROL SULFATE HFA 108 (90 BASE) MCG/ACT IN AERS: 2.0000 | INHALATION_SPRAY | Freq: Four times a day (QID) | RESPIRATORY_TRACT | 6 refills | Status: DC | PRN

## 2021-10-26 NOTE — Patient Instructions (Addendum)
Nice to meet you  Use Symbicort 2 puff twice a day every day. Rinse mouth after every use.  Use albuterol as needed as you currently are.  Chest xray today  I ordered an ultrasound of your abdomen  I will send Dr. Wyline Mood a message as well regarding belly swelling  Return to clinic in 3 months or sooner as needed

## 2021-10-26 NOTE — Progress Notes (Signed)
 @Patient  ID: Tabitha CousinGlenda Galloway, female    DOB: 10/23/1940, 81 y.o.   MRN: [id number]  Chief Complaint  Patient presents with   Consult    Consult for SOB. Pt states that she has been having SOB since October.     Referring provider: Gennie AlmaEggleston-Clark, Valeni*  HPI:   81 y.o. woman whom we are seeing in consultation for evaluation of dyspnea on exertion.  Most recent cardiology note reviewed.  Patient reports a several month history of dyspnea on exertion, occasional cough.  Worse in the evenings.  Dyspnea worse on inclines or stairs.  No environmental or seasonal factors to get notified to make things better or worse.  No position to make things better or worse.  She has been prescribed prednisone in 08/2021.  This helped for a week or 2.  In addition, she has albuterol prescribed to her.  She uses as needed, 3-4 times a day.  This does also help her symptoms.  No other alleviating or exacerbating factors.  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  Most recent pulmonary note reviewed.  On ICS/LABA therapy in the past.  Not using currently.  Reviewed most echocardiogram 2020 that shows severe left atrial dilation, normal EF could not comment on diastolic function due to atrial fibrillation, mild mitral valve regurgitation, RA dilation, normal RV size and function.  Reviewed most recent chest x-ray 11/2018 to my review interpretation reveals clear lungs bilaterally, evidence of mild hyperinflation on the lateral film.  Reviewed CT chest 04/2018 on my review interpretation shows stable tiny nodules, no other significant findings.  PMH: Obesity, OHS/OSA, severely dilated left atrium, asthma Surgical history: Hysterectomy, ACL repair, breast biopsy, cholecystectomy, ankle surgery, knee replacement, tubal ligation Family history: Father lymphoma, leukemia, CAD, mother with hypertension, emphysema Social history: Never smoker, [place]   Questionaires / Pulmonary  Flowsheets:   ACT:  No flowsheet data found.  MMRC: No flowsheet data found.  Epworth:  No flowsheet data found.  Tests:   FENO:  No results found for: NITRICOXIDE  PFT: No flowsheet data found.  WALK:  No flowsheet data found.  Imaging: Personally reviewed and as per EMR discussion this note  Lab Results: Personally reviewed CBC    Component Value Date/Time   WBC 7.1 11/05/2020 1354   RBC 4.46 11/05/2020 1354   HGB 12.4 11/05/2020 1354   HCT 38.9 11/05/2020 1354   PLT 200 11/05/2020 1354   MCV 87.2 11/05/2020 1354   MCH 27.8 11/05/2020 1354   MCHC 31.9 (L) 11/05/2020 1354   RDW 14.1 11/05/2020 1354   LYMPHSABS 2.3 10/22/2013 1127   MONOABS 0.9 10/22/2013 1127   EOSABS 0.2 10/22/2013 1127   BASOSABS 0.0 10/22/2013 1127    BMET    Component Value Date/Time   NA 138 10/17/2019 1424   K 4.2 10/17/2019 1424   CL 101 10/17/2019 1424   CO2 28 10/17/2019 1424   GLUCOSE 99 10/17/2019 1424   BUN 20 10/17/2019 1424   CREATININE 1.10 (H) 10/17/2019 1424   CALCIUM 9.2 10/17/2019 1424   GFRNONAA 48 (L) 10/17/2019 1424   GFRAA 55 (L) 10/17/2019 1424    BNP    Component Value Date/Time   BNP 187.0 (H) 12/03/2018 1913    ProBNP No results found for: PROBNP  Specialty Problems       Pulmonary Problems   Multiple pulmonary nodules   SOB (shortness of breath)    Allergies  Allergen Reactions   Sulfa Antibiotics  Reaction unknown   Adhesive [Tape] Other (See Comments) and Rash    Skin is very sensitive Skin is very sensitive Skin is very sensitive    Immunization History  Administered Date(s) Administered   DT (Pediatric) 12/04/1996, 07/01/2005   Hepatitis B, adult 05/22/2002, 06/24/2002, 11/18/2002   Influenza Whole 07/02/2014   Influenza, High Dose Seasonal PF 07/02/2014, 06/22/2015, 07/05/2016, 07/06/2018, 07/15/2020, 07/22/2021   Influenza,inj,Quad PF,6+ Mos 06/27/2015   Moderna Sars-Covid-2 Vaccination 11/23/2019, 12/21/2019    Pneumococcal Conjugate-13 11/13/2014   Pneumococcal Polysaccharide-23 05/04/2006, 05/22/2007, 07/06/2018   Pneumococcal-Unspecified 09/26/2005   Td 07/01/2005   Tdap 08/04/2011   Zoster, Live 09/10/2007    Past Medical History:  Diagnosis Date   Anemia    Anxiety    Arthritis    Back pain    reason unknown   Cataract    bilateral immature   Diarrhea    Diverticulosis    GERD (gastroesophageal reflux disease)    takes Omeprazole daily   Headache(784.0)    occasionally   Hiatal hernia    History of blood transfusion    no abnormal reaction noted   History of bronchitis 2014   History of colon polyps    History of shingles    Hypertension    takes Metoprolol,Amlodipine and Losartan daily   Internal hemorrhoids    Joint pain    Joint swelling    Lung nodule    Nocturia    Numbness    hands occasionally   Osteoarthritis    Peripheral edema    takes HCTZ daily and Furosemide daily as needed   PONV (postoperative nausea and vomiting)    Redness    to both lower extremities   Shortness of breath    WITH EXERTION    Sleep apnea    doesn't use cpap   Urinary frequency    Urinary urgency    Venous insufficiency     Tobacco History: Social History   Tobacco Use  Smoking Status Never  Smokeless Tobacco Never   Counseling given: Not Answered   Continue to not smoke  Outpatient Encounter Medications as of 10/26/2021  Medication Sig   acetaminophen (TYLENOL) 500 MG tablet Take 500 mg by mouth every 6 (six) hours as needed. For pain   albuterol (VENTOLIN HFA) 108 (90 Base) MCG/ACT inhaler Inhale 2 puffs into the lungs every 6 (six) hours as needed for wheezing or shortness of breath.   apixaban (ELIQUIS) 5 MG TABS tablet Take 1 tablet (5 mg total) by mouth 2 (two) times daily.   Biotin 5000 MCG CAPS Take 1 capsule by mouth daily.   budesonide-formoterol (SYMBICORT) 160-4.5 MCG/ACT inhaler Inhale 2 puffs into the lungs in the morning and at bedtime.    Cholecalciferol (VITAMIN D3) 10000 units capsule Take 10,000 Units by mouth daily.   furosemide (LASIX) 20 MG tablet TAKE 1 TABLET BY MOUTH EVERY DAY   Glucosamine-Chondroitin (GLUCOSAMINE CHONDR COMPLEX PO) Take 1 capsule by mouth daily.    guaiFENesin (MUCINEX) 600 MG 12 hr tablet Take 600 mg by mouth 2 (two) times daily as needed for cough or to loosen phlegm.   hydrochlorothiazide (HYDRODIURIL) 25 MG tablet hydrochlorothiazide 25 mg tablet  Take 1 tablet every day by oral route.   irbesartan (AVAPRO) 150 MG tablet Take 150 mg by mouth daily.   metoprolol tartrate (LOPRESSOR) 50 MG tablet TAKE 1 TABLET BY MOUTH TWICE A DAY   Omega-3 Fatty Acids (OMEGA-3 FISH OIL PO) Take 1 capsule by mouth  daily.   omeprazole (PRILOSEC) 20 MG capsule Take 20 mg by mouth daily.   potassium gluconate 595 MG TABS tablet Take 1,190 mg by mouth daily.    triamcinolone lotion (KENALOG) 0.1 % Apply 1 application topically 3 (three) times daily.   vitamin B-12 (CYANOCOBALAMIN) 1000 MCG tablet Take 1,000 mcg by mouth daily.   [DISCONTINUED] albuterol (VENTOLIN HFA) 108 (90 Base) MCG/ACT inhaler Inhale 2 puffs into the lungs every 6 (six) hours as needed.   [DISCONTINUED] guaiFENesin-codeine 100-10 MG/5ML syrup Take 10 mLs by mouth every 4 (four) hours as needed.   No facility-administered encounter medications on file as of 10/26/2021.     Review of Systems  Review of Systems  No chest pain with exertion.  No orthopnea or PND.  Comprehensive review of systems otherwise negative. Physical Exam  BP 128/70 (BP Location: Left Arm, Patient Position: Sitting, Cuff Size: Normal)    Pulse 85    Temp 98.5 F (36.9 C) (Oral)    Ht 5\' 8"  (1.727 m)    Wt 288 lb (130.6 kg)    LMP 02/07/2012    SpO2 98%    BMI 43.79 kg/m   Wt Readings from Last 5 Encounters:  10/26/21 288 lb (130.6 kg)  08/11/21 288 lb 9.6 oz (130.9 kg)  02/02/21 271 lb 12.8 oz (123.3 kg)  10/27/20 265 lb (120.2 kg)  10/15/20 265 lb (120.2 kg)     BMI Readings from Last 5 Encounters:  10/26/21 43.79 kg/m  08/11/21 43.88 kg/m  02/02/21 41.33 kg/m  10/27/20 40.29 kg/m  10/15/20 40.89 kg/m     Physical Exam General: Sitting in exam chair, no acute distress Eyes: EOMI, no icterus Neck: Supple, no JVP Pulmonary: Clear, distant Cardiovascular: Regular rhythm, no murmur Abdomen: Distended, nontender MSK: No synovitis, joint effusion Neuro: Normal gait, no weakness Psych: Normal mood, full affect   Assessment & Plan:   Dyspnea on exertion: Improved with albuterol use.  Improved temporarily with prednisone.  History of asthma.  Likely this represents flare or recurrence of asthma symptoms.  Please see below.  Chest x-ray for further evaluation.  Asthma: Atopic symptoms, history of asthma in the past.  Recurrent bronchitis.  No longer on maintenance therapy.  Symptoms improved with albuterol as well as prednisone.  Start ICS/LABA therapy with high-dose Symbicort 2 puffs twice daily.  Continue albuterol as needed.  Abdominal distention: Worsening despite efforts at weight loss.  Worry about fluid accumulation.  She describe sensations as fluid or bloating.  We will message cardiologist about utility of repeating echocardiogram given what sounds like fluid retention.  Ultrasound of abdomen ordered to evaluate liver, presence of ascites that would be signs of portal hypertension related to liver.   Return in about 3 months (around 01/23/2022).   01/25/2022, MD 10/27/2021

## 2021-10-27 ENCOUNTER — Telehealth: Payer: Self-pay | Admitting: Cardiology

## 2021-10-27 NOTE — Telephone Encounter (Signed)
New Message:     Patient would like for Dr Harl Bowie nurse to call her. She saw her lung doctor yesterday and she was told by him that she needs an Ultrasound of her stomach. He said that she needs it, because her stomach is protruding more than usual. Today they called and said that she have fluid over load in her lungs. Patient wants to know if Dr Harl Bowie needs to see her or what does she  need to do?

## 2021-10-27 NOTE — Progress Notes (Signed)
CXR shows signs of fluid overload. Please let her know and that I messaged her cardiologist to lethim know, as well. Thanks!

## 2021-10-27 NOTE — Progress Notes (Signed)
Dr. Wyline Mood,  CXR reveals signs of mild fluid overload. She also complained of abdominal bloating. Wanted to let you know to see if you felt she should see you in clinic to discuss if additional therapies, studies are needed. Getting her back on asthma regimen as I think this will help some of her symptoms. Thanks!   Matt

## 2021-10-27 NOTE — Telephone Encounter (Signed)
Saw pulmonologist for SOB.  Reports swelling in abdomen Weighs daily-but did not weigh today or yesterday Takes lasix 20 mg daily Gave appointment to see Branch tomorrow at 11:00 am at the [place] office Verbalized understanding

## 2021-10-28 ENCOUNTER — Ambulatory Visit: Payer: Medicare Other | Admitting: Cardiology

## 2021-10-28 ENCOUNTER — Other Ambulatory Visit: Payer: Self-pay

## 2021-10-28 NOTE — Progress Notes (Deleted)
Clinical Summary [name] is a 81 y.o.female seen today for follow up of the following medical problems.     1.DOE - seen by pulmonary - 1 month history of DOE - transient improvement with prednisone, also some improvement with prn albuterol. Does have history of allergic bronchitis - has noted some abdominal distension. CXR did shoe vascular congestion, small pleural effusions, mild CHF   1. 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    -compliant with meds - no recent palpiations - no recent bleeding on eliquis   2. HTN - reports some white coat HTN in the past     Home bp's 120s/70s - she is on prednisone for URI    3. OSA - not using a cpap, has not been interested in         4. Chronic venous insufficiency - followed by vascular Dr Hart RochesterLawson - swelling up and down.  - previously seen in wound clinic.  - wears compression stockings   - weight down to 276 from 306 last visit. Reports significant dieatary changes - swelling improving in the legs - has compression socks     - overall controlled with lasix.     5. Large complex cystic mass - noted by US medial right thigh, stable from prior US 4 months ago - normal Hgb on check, we have continued anticoag. There was some question if this could be a hematoma    6. URI - on zpack, steroids, albuterol per pcp   SH: her significant other is [name] who is also a patient of mine who passed away 10/2019   Past Medical History:  Diagnosis Date   Anemia    Anxiety    Arthritis    Back pain    reason unknown   Cataract    bilateral immature   Diarrhea    Diverticulosis    GERD (gastroesophageal reflux disease)    takes Omeprazole daily   Headache(784.0)    occasionally   Hiatal hernia    History of blood transfusion    no abnormal reaction noted   History of bronchitis 2014   History of colon polyps    History of shingles    Hypertension    takes  Metoprolol,Amlodipine and Losartan daily   Internal hemorrhoids    Joint pain    Joint swelling    Lung nodule    Nocturia    Numbness    hands occasionally   Osteoarthritis    Peripheral edema    takes HCTZ daily and Furosemide daily as needed   PONV (postoperative nausea and vomiting)    Redness    to both lower extremities   Shortness of breath    WITH EXERTION    Sleep apnea    doesn't use cpap   Urinary frequency    Urinary urgency    Venous insufficiency      Allergies  Allergen Reactions   Sulfa Antibiotics     Reaction unknown   Adhesive [Tape] Other (See Comments) and Rash    Skin is very sensitive Skin is very sensitive Skin is very sensitive     Current Outpatient Medications  Medication Sig Dispense Refill   acetaminophen (TYLENOL) 500 MG tablet Take 500 mg by mouth every 6 (six) hours as needed. For pain     albuterol (VENTOLIN HFA) 108 (90 Base) MCG/ACT inhaler Inhale 2 puffs into the lungs every 6 (six) hours as  needed for wheezing or shortness of breath. 1 each 6   apixaban (ELIQUIS) 5 MG TABS tablet Take 1 tablet (5 mg total) by mouth 2 (two) times daily. 180 tablet 3   Biotin 5000 MCG CAPS Take 1 capsule by mouth daily.     budesonide-formoterol (SYMBICORT) 160-4.5 MCG/ACT inhaler Inhale 2 puffs into the lungs in the morning and at bedtime. 1 each 11   Cholecalciferol (VITAMIN D3) 10000 units capsule Take 10,000 Units by mouth daily.     furosemide (LASIX) 20 MG tablet TAKE 1 TABLET BY MOUTH EVERY DAY 90 tablet 2   Glucosamine-Chondroitin (GLUCOSAMINE CHONDR COMPLEX PO) Take 1 capsule by mouth daily.      guaiFENesin (MUCINEX) 600 MG 12 hr tablet Take 600 mg by mouth 2 (two) times daily as needed for cough or to loosen phlegm.     hydrochlorothiazide (HYDRODIURIL) 25 MG tablet hydrochlorothiazide 25 mg tablet  Take 1 tablet every day by oral route.     irbesartan (AVAPRO) 150 MG tablet Take 150 mg by mouth daily.     metoprolol tartrate (LOPRESSOR)  50 MG tablet TAKE 1 TABLET BY MOUTH TWICE A DAY 180 tablet 1   Omega-3 Fatty Acids (OMEGA-3 FISH OIL PO) Take 1 capsule by mouth daily.     omeprazole (PRILOSEC) 20 MG capsule Take 20 mg by mouth daily.     potassium gluconate 595 MG TABS tablet Take 1,190 mg by mouth daily.      triamcinolone lotion (KENALOG) 0.1 % Apply 1 application topically 3 (three) times daily.     vitamin B-12 (CYANOCOBALAMIN) 1000 MCG tablet Take 1,000 mcg by mouth daily.     No current facility-administered medications for this visit.     Past Surgical History:  Procedure Laterality Date   ABDOMINAL HYSTERECTOMY  1983   ANKLE SURGERY Left 1993   ANTERIOR CRUCIATE LIGAMENT REPAIR Left 2013   BREAST SURGERY     LEFT SIDE BIOPSY 1989   CHOLECYSTECTOMY  1990   COLONOSCOPY     ENDOVENOUS ABLATION SAPHENOUS VEIN W/ LASER Right 04/17/2017   endovenous laser ablation R [facility] by Josephina Gip MD   ESOPHAGOGASTRODUODENOSCOPY     HAND SURGERY Left 2011   QUADRICEPS TENDON REPAIR  07/16/2012   Procedure: REPAIR QUADRICEP TENDON;  Surgeon: Raymon Mutton, MD;  Location: [facility] OR;  Service: Orthopedics;  Laterality: Left;  open repair of VMO    quiadricp  10'21/2012   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   Procedure: TOTAL KNEE ARTHROPLASTY;  Surgeon: Raymon Mutton, MD;  Location: [facility] OR;  Service: Orthopedics;  Laterality: Left;   TOTAL KNEE ARTHROPLASTY Right 11/04/2013   DR Sherlean Foot   TOTAL KNEE ARTHROPLASTY Right 11/04/2013   Procedure: RIGHT TOTAL KNEE ARTHROPLASTY;  Surgeon: Dannielle Huh, MD;  Location: [facility] OR;  Service: Orthopedics;  Laterality: Right;   TUBAL LIGATION  1974   vasculitic eruption of legs  1985     Allergies  Allergen Reactions   Sulfa Antibiotics     Reaction unknown   Adhesive [Tape] Other (See Comments) and Rash    Skin is very sensitive Skin is very sensitive Skin is very sensitive      Family History  Problem Relation Age of Onset   Lymphoma Father    Leukemia Father    Heart attack  Father    Hypertension Mother    Emphysema Mother    Heart disease Sister    Rashes / Skin problems Sister  Heart failure Sister    Kidney disease Sister    Heart disease Maternal Grandfather    Heart disease Paternal Grandmother    Heart disease Paternal Grandfather      Social History Ms. Federer reports that she has never smoked. She has never used smokeless tobacco. Ms. Badger reports no history of alcohol use.   Review of Systems CONSTITUTIONAL: No weight loss, fever, chills, weakness or fatigue.  HEENT: Eyes: No visual loss, blurred vision, double vision or yellow sclerae.No hearing loss, sneezing, congestion, runny nose or sore throat.  SKIN: No rash or itching.  CARDIOVASCULAR:  RESPIRATORY: No shortness of breath, cough or sputum.  GASTROINTESTINAL: No anorexia, nausea, vomiting or diarrhea. No abdominal pain or blood.  GENITOURINARY: No burning on urination, no polyuria NEUROLOGICAL: No headache, dizziness, syncope, paralysis, ataxia, numbness or tingling in the extremities. No change in bowel or bladder control.  MUSCULOSKELETAL: No muscle, back pain, joint pain or stiffness.  LYMPHATICS: No enlarged nodes. No history of splenectomy.  PSYCHIATRIC: No history of depression or anxiety.  ENDOCRINOLOGIC: No reports of sweating, cold or heat intolerance. No polyuria or polydipsia.  Marland Kitchen   Physical Examination There were no vitals filed for this visit. There were no vitals filed for this visit.  Gen: resting comfortably, no acute distress HEENT: no scleral icterus, pupils equal round and reactive, no palptable cervical adenopathy,  CV Resp: Clear to auscultation bilaterally GI: abdomen is soft, non-tender, non-distended, normal bowel sounds, no hepatosplenomegaly MSK: extremities are warm, no edema.  Skin: warm, no rash Neuro:  no focal deficits Psych: appropriate affect   Diagnostic Studies  11/2018 echo IMPRESSIONS      1. The left ventricle has normal  systolic function, with an ejection fraction of 55-60%. The cavity size was normal. There is moderate concentric left ventricular hypertrophy. Left ventricular diastolic Doppler parameters are indeterminate secondary to  atrial fibrillation. No evidence of left ventricular regional wall motion abnormalities.  2. The right ventricle has normal systolic function. The cavity was normal. There is no increase in right ventricular wall thickness.  3. Left atrial size was moderate to severely dilated.  4. Right atrial size was mildly dilated.  5. There is mild mitral annular calcification present.  6. The tricuspid valve is grossly normal.  7. The aortic valve is tricuspid.  8. The aortic root is normal in size and structure.   Assessment and Plan  1. Persistent afib -no symptoms, continue current meds     2. Leg edema/Venous insufficiency -controlled, continue lasix   3. HTN  elevated in clinic, home numbers at goal, which is her normal pattern - continue current meds      Antoine Poche, M.D.

## 2021-10-29 ENCOUNTER — Encounter: Payer: Self-pay | Admitting: Cardiology

## 2021-10-29 ENCOUNTER — Ambulatory Visit (INDEPENDENT_AMBULATORY_CARE_PROVIDER_SITE_OTHER): Payer: Medicare Other | Admitting: Cardiology

## 2021-10-29 VITALS — BP 122/74 | HR 77 | Ht 68.0 in | Wt 288.4 lb

## 2021-10-29 DIAGNOSIS — R0602 Shortness of breath: Secondary | ICD-10-CM | POA: Diagnosis not present

## 2021-10-29 MED ORDER — FUROSEMIDE 20 MG PO TABS: ORAL_TABLET | ORAL | 3 refills | Status: DC

## 2021-10-29 NOTE — Patient Instructions (Addendum)
Medication Instructions:   Take Lasix 40 mg daily for the next 3 days, Today,Saturday, and Sunday. Call us Monday with weights and update [place] regarding shortness of breath.  INCREASE Lasix to 20 mg daily. May take additional 20 mg for shortness of breath or swelling  Labwork: None today  Testing/Procedures: Your physician has requested that you have an echocardiogram. Echocardiography is a painless test that uses sound waves to create images of your heart. It provides your doctor with information about the size and shape of your heart and how well your hearts chambers and valves are working. This procedure takes approximately one hour. There are no restrictions for this procedure.   Follow-Up:Keep  May apt with Dr.Branch   Any Other Special Instructions Will Be Listed Below (If Applicable).  If you need a refill on your cardiac medications before your next appointment, please call your pharmacy.

## 2021-10-29 NOTE — Progress Notes (Signed)
Clinical Summary [name] is a 81 y.o.female seen today for follow up of the following medical problems. This is a focused visit on recent symptoms of SOB.      1.DOE - seen by pulmonary - 1 month history of DOE - transient improvement with prednisone, also some improvement with prn albuterol. 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 CXR did shoe vascular congestion, small pleural effusions, mild CHF - 11/2018 echo LVEF 0000000, indet diastolic fxn - compliant with lasix     Other medical problems not addressed this visit  1. 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    -compliant with meds - no recent palpiations - no recent bleeding on eliquis   2. HTN - reports some white coat HTN in the past     Home bp's 120s/70s - she is on prednisone for URI    3. OSA - not using a cpap, has not been interested in        4. Chronic venous insufficiency - followed by vascular Dr Kellie Simmering - swelling up and down.  - previously seen in wound clinic.  - wears compression stockings   - weight down to 276 from 306 last visit. Reports significant dieatary changes - swelling improving in the legs - has compression socks     - overall controlled with lasix.     5. Large complex cystic mass - noted by [place] medial right thigh, stable from prior US 4 months ago - normal Hgb on check, we have continued anticoag. There was some question if this could be a hematoma      SH: her significant other is Tad Moore who is also a patient of mine who passed away 11-06-19   Past Medical History:  Diagnosis Date   Anemia    Anxiety    Arthritis    Back pain    reason unknown   Cataract    bilateral immature   Diarrhea    Diverticulosis    GERD (gastroesophageal reflux disease)    takes Omeprazole daily   Headache(784.0)    occasionally   Hiatal hernia    History of blood transfusion    no abnormal reaction  noted   History of bronchitis 2014   History of colon polyps    History of shingles    Hypertension    takes Metoprolol,Amlodipine and Losartan daily   Internal hemorrhoids    Joint pain    Joint swelling    Lung nodule    Nocturia    Numbness    hands occasionally   Osteoarthritis    Peripheral edema    takes HCTZ daily and Furosemide daily as needed   PONV (postoperative nausea and vomiting)    Redness    to both lower extremities   Shortness of breath    WITH EXERTION    Sleep apnea    doesn't use cpap   Urinary frequency    Urinary urgency    Venous insufficiency      Allergies  Allergen Reactions   Sulfa Antibiotics     Reaction unknown   Adhesive [Tape] Other (See Comments) and Rash    Skin is very sensitive Skin is very sensitive Skin is very sensitive     Current Outpatient Medications  Medication Sig Dispense Refill   acetaminophen (TYLENOL) 500 MG tablet Take 500 mg by mouth every 6 (six) hours as needed. For  pain     albuterol (VENTOLIN HFA) 108 (90 Base) MCG/ACT inhaler Inhale 2 puffs into the lungs every 6 (six) hours as needed for wheezing or shortness of breath. 1 each 6   apixaban (ELIQUIS) 5 MG TABS tablet Take 1 tablet (5 mg total) by mouth 2 (two) times daily. 180 tablet 3   Biotin 5000 MCG CAPS Take 1 capsule by mouth daily.     budesonide-formoterol (SYMBICORT) 160-4.5 MCG/ACT inhaler Inhale 2 puffs into the lungs in the morning and at bedtime. 1 each 11   Cholecalciferol (VITAMIN D3) 10000 units capsule Take 10,000 Units by mouth daily.     furosemide (LASIX) 20 MG tablet TAKE 1 TABLET BY MOUTH EVERY DAY 90 tablet 2   Glucosamine-Chondroitin (GLUCOSAMINE CHONDR COMPLEX PO) Take 1 capsule by mouth daily.      guaiFENesin (MUCINEX) 600 MG 12 hr tablet Take 600 mg by mouth 2 (two) times daily as needed for cough or to loosen phlegm.     hydrochlorothiazide (HYDRODIURIL) 25 MG tablet hydrochlorothiazide 25 mg tablet  Take 1 tablet every day by  oral route.     irbesartan (AVAPRO) 150 MG tablet Take 150 mg by mouth daily.     metoprolol tartrate (LOPRESSOR) 50 MG tablet TAKE 1 TABLET BY MOUTH TWICE A DAY 180 tablet 1   Omega-3 Fatty Acids (OMEGA-3 FISH OIL PO) Take 1 capsule by mouth daily.     omeprazole (PRILOSEC) 20 MG capsule Take 20 mg by mouth daily.     potassium gluconate 595 MG TABS tablet Take 1,190 mg by mouth daily.      triamcinolone lotion (KENALOG) 0.1 % Apply 1 application topically 3 (three) times daily.     vitamin B-12 (CYANOCOBALAMIN) 1000 MCG tablet Take 1,000 mcg by mouth daily.     No current facility-administered medications for this visit.     Past Surgical History:  Procedure Laterality Date   ABDOMINAL HYSTERECTOMY  1983   ANKLE SURGERY Left 1993   ANTERIOR CRUCIATE LIGAMENT REPAIR Left 2013   BREAST SURGERY     LEFT SIDE BIOPSY 1989   CHOLECYSTECTOMY  1990   COLONOSCOPY     ENDOVENOUS ABLATION SAPHENOUS VEIN W/ LASER Right 04/17/2017   endovenous laser ablation R [facility] by Tinnie Gens MD   ESOPHAGOGASTRODUODENOSCOPY     HAND SURGERY Left 2011   [place]  07/16/2012   Procedure: REPAIR QUADRICEP TENDON;  Surgeon: Rudean Haskell, MD;  Location: [facility];  Service: Orthopedics;  Laterality: Left;  open repair of VMO    quiadricp  10'21/2012   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   Procedure: TOTAL KNEE ARTHROPLASTY;  Surgeon: Rudean Haskell, MD;  Location: [facility];  Service: Orthopedics;  Laterality: Left;   TOTAL KNEE ARTHROPLASTY Right 11/04/2013   DR Ronnie Derby   TOTAL KNEE ARTHROPLASTY Right 11/04/2013   Procedure: RIGHT TOTAL KNEE ARTHROPLASTY;  Surgeon: Vickey Huger, MD;  Location: [facility];  Service: Orthopedics;  Laterality: Right;   TUBAL LIGATION  1974   vasculitic eruption of legs  1985     Allergies  Allergen Reactions   Sulfa Antibiotics     Reaction unknown   Adhesive [Tape] Other (See Comments) and Rash    Skin is very sensitive Skin is very sensitive Skin is very sensitive       Family History  Problem Relation Age of Onset   Lymphoma Father    Leukemia Father    Heart attack Father  Hypertension Mother    Emphysema Mother    Heart disease Sister    Rashes / Skin problems Sister    Heart failure Sister    Kidney disease Sister    Heart disease Maternal Grandfather    Heart disease Paternal Grandmother    Heart disease Paternal Grandfather      Social History [name] reports that she has never smoked. She has never used smokeless tobacco. [name] reports no history of alcohol use.   Review of Systems CONSTITUTIONAL: No weight loss, fever, chills, weakness or fatigue.  HEENT: Eyes: No visual loss, blurred vision, double vision or yellow sclerae.No hearing loss, sneezing, congestion, runny nose or sore throat.  SKIN: No rash or itching.  CARDIOVASCULAR: per hpi RESPIRATORY:per hpi GASTROINTESTINAL: No anorexia, nausea, vomiting or diarrhea. No abdominal pain or blood.  GENITOURINARY: No burning on urination, no polyuria NEUROLOGICAL: No headache, dizziness, syncope, paralysis, ataxia, numbness or tingling in the extremities. No change in bowel or bladder control.  MUSCULOSKELETAL: No muscle, back pain, joint pain or stiffness.  LYMPHATICS: No enlarged nodes. No history of splenectomy.  PSYCHIATRIC: No history of depression or anxiety.  ENDOCRINOLOGIC: No reports of sweating, cold or heat intolerance. No polyuria or polydipsia.  Marland Kitchen   Physical Examination Today's Vitals   10/29/21 1053  BP: 122/74  Pulse: 77  SpO2: 97%  Weight: 288 lb 6.4 oz (130.8 kg)  Height: 5\' 8"  (1.727 m)   Body mass index is 43.85 kg/m.  Gen: resting comfortably, no acute distress HEENT: no scleral icterus, pupils equal round and reactive, no palptable cervical adenopathy,  CV: RRR, no mr/g. Elevated JVD Resp: Clear to auscultation bilaterally GI: abdomen is soft, non-tender, non-distended, normal bowel sounds, no hepatosplenomegaly MSK: extremities are  warm, 1+ bilateral nonpitting edema  Skin: warm, no rash Neuro:  no focal deficits Psych: appropriate affect   Diagnostic Studies  11/2018 echo IMPRESSIONS      1. The left ventricle has normal systolic function, with an ejection fraction of 55-60%. The cavity size was normal. There is moderate concentric left ventricular hypertrophy. Left ventricular diastolic Doppler parameters are indeterminate secondary to  atrial fibrillation. No evidence of left ventricular regional wall motion abnormalities.  2. The right ventricle has normal systolic function. The cavity was normal. There is no increase in right ventricular wall thickness.  3. Left atrial size was moderate to severely dilated.  4. Right atrial size was mildly dilated.  5. There is mild mitral annular calcification present.  6. The tricuspid valve is grossly normal.  7. The aortic valve is tricuspid.  8. The aortic root is normal in size and structure.   Assessment and Plan   1. SOB - likely multifactorial with allergic bronchitis managed by pulmonary as well some pulmonary edema noted on CXR, JVD is elevated today - she will increase her lasix, take 20mg  daily and additional 20mg  prn sob. Asked to take 40mg  daily over the weekend and update [place] on Monday on weights and symptoms - likely obtain bmet/mg/bnp after Monday call - with new onset pulmonary edema repeat echo        Arnoldo Lenis, M.D.

## 2021-11-01 ENCOUNTER — Telehealth: Payer: Self-pay | Admitting: Cardiology

## 2021-11-01 DIAGNOSIS — I10 Essential (primary) hypertension: Secondary | ICD-10-CM

## 2021-11-01 DIAGNOSIS — R0602 Shortness of breath: Secondary | ICD-10-CM

## 2021-11-01 NOTE — Telephone Encounter (Signed)
Can she get a bmet/mg/bnp please. Continue the lasix 40mg  daily another 2 days then go back to the 20mg  daily with prn additional 20mg  for edema or weight gain of 3 lbs or more  J Theresea Trautmann MD

## 2021-11-01 NOTE — Telephone Encounter (Signed)
Pt is calling to report her weights:  Sat- Sun : lost 4 lbs  Sun- Mon: lost 2 lbs  Continuing to take 40 mg

## 2021-11-02 NOTE — Telephone Encounter (Signed)
Patient notified that she can get her labs the same day as her Echo which is scheduled for Monday, 11/08/2021 at 3:45.  She has been put on the lab schedule and can go around 3 or 3:15.

## 2021-11-05 ENCOUNTER — Ambulatory Visit (HOSPITAL_COMMUNITY)
Admission: RE | Admit: 2021-11-05 | Discharge: 2021-11-05 | Disposition: A | Discharge status: Home / Self Care | Payer: Medicare Other | Source: Ambulatory Visit | Attending: Pulmonary Disease | Admitting: Pulmonary Disease

## 2021-11-05 ENCOUNTER — Other Ambulatory Visit: Payer: Self-pay

## 2021-11-05 DIAGNOSIS — R140 Abdominal distension (gaseous): Secondary | ICD-10-CM | POA: Insufficient documentation

## 2021-11-08 ENCOUNTER — Other Ambulatory Visit: Payer: Medicare Other | Admitting: *Deleted

## 2021-11-08 ENCOUNTER — Other Ambulatory Visit: Payer: Self-pay

## 2021-11-08 ENCOUNTER — Ambulatory Visit (HOSPITAL_COMMUNITY): Payer: Medicare Other | Attending: Cardiology

## 2021-11-08 DIAGNOSIS — I10 Essential (primary) hypertension: Secondary | ICD-10-CM

## 2021-11-08 DIAGNOSIS — R0602 Shortness of breath: Secondary | ICD-10-CM | POA: Diagnosis present

## 2021-11-08 LAB — ECHOCARDIOGRAM COMPLETE
Area-P 1/2: 4.09 cm2
S' Lateral: 3.3 cm

## 2021-11-09 ENCOUNTER — Telehealth: Payer: Self-pay | Admitting: *Deleted

## 2021-11-09 LAB — SPECIMEN STATUS

## 2021-11-09 LAB — BASIC METABOLIC PANEL
BUN/Creatinine Ratio: 15 (ref 12–28)
BUN: 16 mg/dL (ref 8–27)
CO2: 29 mmol/L (ref 20–29)
Calcium: 9.7 mg/dL (ref 8.7–10.3)
Chloride: 100 mmol/L (ref 96–106)
Creatinine, Ser: 1.08 mg/dL — ABNORMAL HIGH (ref 0.57–1.00)
Glucose: 109 mg/dL — ABNORMAL HIGH (ref 70–99)
Potassium: 3.7 mmol/L (ref 3.5–5.2)
Sodium: 143 mmol/L (ref 134–144)
eGFR: 52 mL/min/{1.73_m2} — ABNORMAL LOW (ref >59)

## 2021-11-09 LAB — SPECIMEN STATUS REPORT

## 2021-11-09 LAB — BRAIN NATRIURETIC PEPTIDE

## 2021-11-09 LAB — MAGNESIUM: Magnesium: 1.8 mg/dL (ref 1.6–2.3)

## 2021-11-09 NOTE — Telephone Encounter (Signed)
Patient calling to report her weights -   09/21/2021 - 282.7  2023  2/3 - 285.2 2/4 - 285.1 2/5 - 281.5 2/6 - 279 2/7 - 278 2/8 - 277 2/9 - 275 2/10 - 277 2/11 - 278 2/12 - 280 2/13 - 281

## 2021-11-10 LAB — PRO B NATRIURETIC PEPTIDE: NT-Pro BNP: 1345 pg/mL — ABNORMAL HIGH (ref 0–738)

## 2021-11-10 LAB — SPECIMEN STATUS REPORT

## 2021-11-10 MED ORDER — FUROSEMIDE 20 MG PO TABS: 40.0000 mg | ORAL_TABLET | Freq: Every day | ORAL | Status: DC

## 2021-11-10 NOTE — Telephone Encounter (Signed)
Patient notified and verbalized understanding.  She will take 2 tabs of her Lasix 20mg  to equal the daily 40mg  dose.

## 2021-11-10 NOTE — Telephone Encounter (Signed)
Looks like weights were trending down to right around the time we lowered her lasix back to 20mg  daily. We had written she could take prn lasix 20mg , had she taken any? Labs show stable kidney function, show some onggoing fluid. Increase lasix back to 40mg  daily  J Merit Gadsby MD

## 2021-11-11 ENCOUNTER — Telehealth: Payer: Self-pay | Admitting: Pulmonary Disease

## 2021-11-11 NOTE — Telephone Encounter (Signed)
Called and left detailed message per patients DPR that the ultrasound showed no fluid per MH and that this was good news. And that if she has any questions or concerns to call the office. Nothing further needed

## 2021-11-11 NOTE — Telephone Encounter (Signed)
Called patient and she was wanting her test results if they were in yet. I told her I would message MH and see if he could look at her scans when he gets a chance, and that I will call her and let her know when I have those results.  Dr Judeth Horn please advise

## 2021-11-11 NOTE — Telephone Encounter (Signed)
The ultrasound showed no fluid which is good.

## 2021-11-12 ENCOUNTER — Telehealth: Payer: Self-pay | Admitting: Cardiology

## 2021-11-12 NOTE — Telephone Encounter (Signed)
°  Patient would like to discuss her ECHO results

## 2021-11-12 NOTE — Telephone Encounter (Signed)
Tabitha Chris, LPN  1/47/8295  4:56 PM EST Back to Top    Notified, copy to pcp.    Antoine Poche, MD  11/12/2021  4:33 PM EST     Echo shows heart pumping function remains strong, has some age related stiffness of the heart muscle that can cause some swelling but considered a minor findings. There also is a leaky heart valve that may be contributing to her swelling, typically the treatment for this is just fluid pills which we are doing

## 2021-11-12 NOTE — Telephone Encounter (Signed)
Just resulted  J Garald Rhew MD 

## 2021-11-30 ENCOUNTER — Other Ambulatory Visit: Payer: Medicare Other

## 2022-01-06 ENCOUNTER — Telehealth: Payer: Self-pay | Admitting: Family Medicine

## 2022-01-06 NOTE — Telephone Encounter (Signed)
Patient called and states that the lesion on her leg is swollen, hot and tender. She was taking medication for a sinus infection but she finished yesterday. (Cefdinir 300mg  bid x 7 days.) She is requesting an appt for April 25th as that is when her dtr will be available to bring her and she is more comfortable with her dtr being with her.  ? ?I recommended she go to the [facility] as Dr. April 27 is on call and would see her if needed. She declined. I recommended she be seen sooner then 01/18/22 and she also declined an earlier appt.  ? ?I then informed her that if it gets worse or she starts to run a fever > 100.7 she need to seek medical attention ASAP. Pt verbalized understanding.  ?

## 2022-01-18 ENCOUNTER — Ambulatory Visit (INDEPENDENT_AMBULATORY_CARE_PROVIDER_SITE_OTHER): Payer: Medicare Other | Admitting: General Surgery

## 2022-01-18 ENCOUNTER — Other Ambulatory Visit: Payer: Self-pay

## 2022-01-18 ENCOUNTER — Encounter: Payer: Self-pay | Admitting: General Surgery

## 2022-01-18 VITALS — BP 165/69 | HR 81 | Temp 97.6°F | Resp 16 | Ht 68.0 in | Wt 270.0 lb

## 2022-01-18 DIAGNOSIS — S7011XD Contusion of right thigh, subsequent encounter: Secondary | ICD-10-CM

## 2022-01-19 NOTE — Progress Notes (Signed)
Subjective:  ?  ? [name]  ?Patient presents with some redness and tenderness in the right thigh hematoma that had been diagnosed by myself in the past.  She states that several weeks ago, it became hot and red.  She restarted an antibiotic for sinus infection.  She states that it has softened somewhat, and the redness has decreased.  She wanted me to check for an abscess.  She does state that it is getting better and resolving.  She is still on Eliquis for atrial fibrillation.  MRI done in June 2022 was negative for any mass lesion.  It was felt to be either a hematoma that had liquefied or seroma.  Patient denies any fever or chills. ?Objective:  ? ? BP (!) 165/69   Pulse 81   Temp 97.6 ?F (36.4 ?C) (Oral)   Resp 16   Ht 5\' 8"  (1.727 m)   Wt 270 lb (122.5 kg)   LMP 02/07/2012   SpO2 96%   BMI 41.05 kg/m?  ? ?General:  alert, cooperative, and no distress  ?Head is normocephalic, atraumatic ?Lungs clear to auscultation with good breath sounds bilaterally ?Heart examination reveals an irregular rate and rhythm ?Right upper inner thigh with a large has induration circumferentially along the inferior aspect with some fluctuance present, though it is difficult to determine whether this is just normal subcutaneous tissue versus liquefied hematoma.  It does not appear to be frank abscess as there is minimal erythema overlying the fluid.  It appears to be greater than 6 cm in diameter. ?   ? ?Assessment:  ? ? Probable resolving liquefied hematoma.  I did not aspirate the mass as she has been taking Eliquis.  ?  ?Plan:  ?We will continue to monitor at this point.  As it is improving, this may have been a hematoma that recurred and is now liquefying.  I told her to return in 2 weeks for reevaluation.  If it develops more redness and pain, she was instructed to see me sooner.  Patient is now off antibiotics and will continue to monitor at this point. ?

## 2022-01-20 ENCOUNTER — Encounter: Payer: Self-pay | Admitting: Pulmonary Disease

## 2022-01-20 ENCOUNTER — Ambulatory Visit (INDEPENDENT_AMBULATORY_CARE_PROVIDER_SITE_OTHER): Payer: Medicare Other | Admitting: Pulmonary Disease

## 2022-01-20 VITALS — BP 132/78 | HR 89 | Temp 98.3°F | Ht 68.0 in | Wt 269.0 lb

## 2022-01-20 DIAGNOSIS — R0602 Shortness of breath: Secondary | ICD-10-CM

## 2022-01-20 NOTE — Patient Instructions (Signed)
Nice to see you again ? ?I am so glad your breathing is so much better ? ?Okay to stay off the Symbicort, I recommend you continue to keep albuterol with you in case you get symptoms when you at least expected ? ?If the shortness of breath were to return, I would likely recommend you resume the Symbicort, please let me know if you feel like symptoms are worsening. ? ?Return to clinic in 1 year or sooner as needed with Dr. Judeth Horn ?

## 2022-01-20 NOTE — Progress Notes (Signed)
? ?@Patient  ID: , female    DOB: 1941/04/29, 81 y.o.   MRN: [id number] ? ?Chief Complaint  ?Patient presents with  ? Follow-up  ?  Pt is here for 3 month follow up. Pt states that she is doing a lot better. Pt is on Albuterol and Symbicort. She states that she hasn't had to use them a lot but she doe shave them if she does need them. Lasix was increased to 40mg  daily   ? ? ?Referring provider: ?[id number]* ? ?HPI:  ? ?81 y.o. woman whom we are seeing in follow up for evaluation of dyspnea on exertion.  Most recent cardiology note reviewed. ? ?Overall, doing quite well.  Dyspnea much much better.  Attributes initially to Symbicort use.  In addition cardiology increased her Lasix.  Combination of both of improved her breathing.  She been off Symbicort for several weeks now.  Self discontinued.  Has not used albuterol in a long time.  She continues to use Lasix as prescribed, 40 mg daily which is the increased dose. ? ?HPI at initial visit: ?Patient reports a several month history of dyspnea on exertion, occasional cough.  Worse in the evenings.  Dyspnea worse on inclines or stairs.  No environmental or seasonal factors to get notified to make things better or worse.  No position to make things better or worse.  She has been prescribed prednisone in 08/2021.  This helped for a week or 2.  In addition, she has albuterol prescribed to her.  She uses as needed, 3-4 times a day.  This does also help her symptoms.  No other alleviating or exacerbating factors.  Formally followed by Dr. 94 for allergic bronchitis.  Most recent pulmonary note reviewed.  On ICS/LABA therapy in the past.  Not using currently. ? ?Reviewed most echocardiogram 2020 that shows severe left atrial dilation, normal EF could not comment on diastolic function due to atrial fibrillation, mild mitral valve regurgitation, RA dilation, normal RV size and function.  Reviewed most recent chest x-ray 11/2018 to my review interpretation  reveals clear lungs bilaterally, evidence of mild hyperinflation on the lateral film.  Reviewed CT chest 04/2018 on my review interpretation shows stable tiny nodules, no other significant findings. ? ?PMH: Obesity, OHS/OSA, severely dilated left atrium, asthma ?Surgical history: Hysterectomy, ACL repair, breast biopsy, cholecystectomy, ankle surgery, knee replacement, tubal ligation ?Family history: Father lymphoma, leukemia, CAD, mother with hypertension, emphysema ?Social history: Never smoker, lives in [place] 05/2018 ? ? ?Questionaires / Pulmonary Flowsheets:  ? ?ACT:  ?   ? View : No data to display.  ?  ?  ?  ? ? ?MMRC: ?   ? View : No data to display.  ?  ?  ?  ? ? ?[place]:  ?   ? View : No data to display.  ?  ?  ?  ? ? ?Tests:  ? ?FENO:  ?No results found for: NITRICOXIDE ? ?PFT: ?   ? View : No data to display.  ?  ?  ?  ? ? ?WALK:  ?   ? View : No data to display.  ?  ?  ?  ? ? ?Imaging: ?Personally reviewed and as per EMR discussion this note ? ?Lab Results: ?Personally reviewed ?CBC ?   ?Component Value Date/Time  ? WBC WILL FOLLOW 11/08/2021 0000  ? WBC 7.1 11/05/2020 1354  ? RBC WILL FOLLOW 11/08/2021 0000  ? RBC 4.46 11/05/2020 1354  ? HGB WILL FOLLOW  11/08/2021 0000  ? HCT WILL FOLLOW 11/08/2021 0000  ? PLT WILL FOLLOW 11/08/2021 0000  ? MCV WILL FOLLOW 11/08/2021 0000  ? MCH WILL FOLLOW 11/08/2021 0000  ? MCH 27.8 11/05/2020 1354  ? MCHC WILL FOLLOW 11/08/2021 0000  ? MCHC 31.9 (L) 11/05/2020 1354  ? RDW WILL FOLLOW 11/08/2021 0000  ? LYMPHSABS WILL FOLLOW 11/08/2021 0000  ? MONOABS 0.9 10/22/2013 1127  ? EOSABS WILL FOLLOW 11/08/2021 0000  ? BASOSABS WILL FOLLOW 11/08/2021 0000  ? ? ?BMET ?   ?Component Value Date/Time  ? NA 143 11/08/2021 0000  ? K 3.7 11/08/2021 0000  ? CL 100 11/08/2021 0000  ? CO2 29 11/08/2021 0000  ? GLUCOSE 109 (H) 11/08/2021 0000  ? GLUCOSE 99 10/17/2019 1424  ? BUN 16 11/08/2021 0000  ? CREATININE 1.08 (H) 11/08/2021 0000  ? CALCIUM 9.7 11/08/2021 0000  ? GFRNONAA  48 (L) 10/17/2019 1424  ? GFRAA 55 (L) 10/17/2019 1424  ? ? ?BNP ?   ?Component Value Date/Time  ? BNP CANCELED 11/08/2021 0000  ? BNP 187.0 (H) 12/03/2018 1913  ? ? ?ProBNP ?   ?Component Value Date/Time  ? PROBNP 1,345 (H) 11/08/2021 0000  ? ? ?Specialty Problems   ? ?  ? Pulmonary Problems  ? Multiple pulmonary nodules  ? SOB (shortness of breath)  ? ? ?Allergies  ?Allergen Reactions  ? Sulfa Antibiotics   ?  Reaction unknown  ? Adhesive [Tape] Other (See Comments) and Rash  ?  Skin is very sensitive ?Skin is very sensitive ?Skin is very sensitive  ? ? ?Immunization History  ?Administered Date(s) Administered  ? DT (Pediatric) 12/04/1996, 07/01/2005  ? Hepatitis B, adult 05/22/2002, 06/24/2002, 11/18/2002  ? Influenza Whole 07/02/2014  ? Influenza, High Dose Seasonal PF 07/02/2014, 06/22/2015, 07/05/2016, 07/06/2018, 07/15/2020, 07/22/2021  ? Influenza,inj,Quad PF,6+ Mos 06/27/2015  ? Moderna Sars-Covid-2 Vaccination 11/23/2019, 12/21/2019  ? Pneumococcal Conjugate-13 11/13/2014  ? Pneumococcal Polysaccharide-23 05/04/2006, 05/22/2007, 07/06/2018  ? Pneumococcal-Unspecified 09/26/2005  ? Td 07/01/2005  ? Tdap 08/04/2011  ? Zoster, Live 09/10/2007  ? ? ?Past Medical History:  ?Diagnosis Date  ? Anemia   ? Anxiety   ? Arthritis   ? Back pain   ? reason unknown  ? Cataract   ? bilateral immature  ? Diarrhea   ? Diverticulosis   ? GERD (gastroesophageal reflux disease)   ? takes Omeprazole daily  ? Headache(784.0)   ? occasionally  ? Hiatal hernia   ? History of blood transfusion   ? no abnormal reaction noted  ? History of bronchitis 2014  ? History of colon polyps   ? History of shingles   ? Hypertension   ? takes Metoprolol,Amlodipine and Losartan daily  ? Internal hemorrhoids   ? Joint pain   ? Joint swelling   ? Lung nodule   ? Nocturia   ? Numbness   ? hands occasionally  ? Osteoarthritis   ? Peripheral edema   ? takes HCTZ daily and Furosemide daily as needed  ? PONV (postoperative nausea and vomiting)   ?  Redness   ? to both lower extremities  ? Shortness of breath   ? WITH EXERTION   ? Sleep apnea   ? doesn't use cpap  ? Urinary frequency   ? Urinary urgency   ? Venous insufficiency   ? ? ?Tobacco History: ?Social History  ? ?Tobacco Use  ?Smoking Status Never  ?Smokeless Tobacco Never  ? ?Counseling given: Not Answered ? ? ?Continue  to not smoke ? ?Outpatient Encounter Medications as of 01/20/2022  ?Medication Sig  ? acetaminophen (TYLENOL) 500 MG tablet Take 500 mg by mouth every 6 (six) hours as needed. For pain  ? albuterol (VENTOLIN HFA) 108 (90 Base) MCG/ACT inhaler Inhale 2 puffs into the lungs every 6 (six) hours as needed for wheezing or shortness of breath.  ? apixaban (ELIQUIS) 5 MG TABS tablet Take 1 tablet (5 mg total) by mouth 2 (two) times daily.  ? Biotin 5000 MCG CAPS Take 1 capsule by mouth daily.  ? budesonide-formoterol (SYMBICORT) 160-4.5 MCG/ACT inhaler Inhale 2 puffs into the lungs in the morning and at bedtime.  ? Cholecalciferol (VITAMIN D3) 10000 units capsule Take 10,000 Units by mouth daily.  ? furosemide (LASIX) 20 MG tablet Take 2 tablets (40 mg total) by mouth daily.  ? Glucosamine-Chondroitin (GLUCOSAMINE CHONDR COMPLEX PO) Take 1 capsule by mouth daily.   ? guaiFENesin (MUCINEX) 600 MG 12 hr tablet Take 600 mg by mouth 2 (two) times daily as needed for cough or to loosen phlegm.  ? irbesartan (AVAPRO) 150 MG tablet Take 150 mg by mouth daily.  ? metoprolol tartrate (LOPRESSOR) 50 MG tablet TAKE 1 TABLET BY MOUTH TWICE A DAY  ? Omega-3 Fatty Acids (OMEGA-3 FISH OIL PO) Take 1 capsule by mouth daily.  ? omeprazole (PRILOSEC) 20 MG capsule Take 20 mg by mouth daily.  ? potassium gluconate 595 MG TABS tablet Take 1,190 mg by mouth daily.   ? triamcinolone lotion (KENALOG) 0.1 % Apply 1 application topically 3 (three) times daily.  ? vitamin B-12 (CYANOCOBALAMIN) 1000 MCG tablet Take 1,000 mcg by mouth daily.  ? [DISCONTINUED] hydrochlorothiazide (HYDRODIURIL) 25 MG tablet  hydrochlorothiazide 25 mg tablet ? Take 1 tablet every day by oral route. (Patient not taking: Reported on 01/20/2022)  ? ?No facility-administered encounter medications on file as of 01/20/2022.  ? ? ? ?Review of System

## 2022-02-01 ENCOUNTER — Ambulatory Visit (INDEPENDENT_AMBULATORY_CARE_PROVIDER_SITE_OTHER): Payer: Medicare Other | Admitting: General Surgery

## 2022-02-01 ENCOUNTER — Encounter: Payer: Self-pay | Admitting: General Surgery

## 2022-02-01 VITALS — BP 158/84 | HR 79 | Temp 97.8°F | Resp 16 | Ht 68.0 in | Wt 268.0 lb

## 2022-02-01 DIAGNOSIS — S7011XD Contusion of right thigh, subsequent encounter: Secondary | ICD-10-CM | POA: Diagnosis not present

## 2022-02-01 NOTE — Progress Notes (Signed)
Subjective:  ?  ? [name]  ?Patient here for follow-up of hematoma of the right thigh.  Patient states it feels much better.  It is not red.  Is not tender to touch.  It is much softer.  It has decreased in size. ?Objective:  ? ? BP (!) 158/84   Pulse 79   Temp 97.8 ?F (36.6 ?C) (Oral)   Resp 16   Ht 5\' 8"  (1.727 m)   Wt 268 lb (121.6 kg)   LMP 02/07/2012   SpO2 96%   BMI 40.75 kg/m?  ? ?General:  alert, cooperative, and no distress  ?Right medial thigh hematoma without overlying skin redness or tenderness.  It appears to be approximately 5 to 6 cm in its greatest diameter.  It is soft. ?   ? ?Assessment:  ? ? Resolving hematoma, right thigh.  Patient is on Eliquis.  ?  ?Plan:  ? ?At this point, no need for surgical evacuation or aspiration at the present time.  No signs of infection.  As she is on Eliquis, any surgical intervention would be more compromised.  She understands this and agrees.  Follow-up here as needed. ?

## 2022-02-11 ENCOUNTER — Encounter: Payer: Self-pay | Admitting: Cardiology

## 2022-02-11 ENCOUNTER — Ambulatory Visit (INDEPENDENT_AMBULATORY_CARE_PROVIDER_SITE_OTHER): Payer: Medicare Other | Admitting: Cardiology

## 2022-02-11 VITALS — BP 138/82 | HR 85 | Ht 68.0 in | Wt 276.4 lb

## 2022-02-11 DIAGNOSIS — I5032 Chronic diastolic (congestive) heart failure: Secondary | ICD-10-CM

## 2022-02-11 MED ORDER — APIXABAN 5 MG PO TABS: 5.0000 mg | ORAL_TABLET | Freq: Two times a day (BID) | ORAL | 3 refills | Status: DC

## 2022-02-11 NOTE — Progress Notes (Signed)
Clinical Summary [name] is a 81 y.o.female  1.DOE/Chronic diastolic HF - seen by pulmonary - 1 month history of DOE - transient improvement with prednisone, also some improvement with prn albuterol. 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 CXR did shoe vascular congestion, small pleural effusions, mild CHF - 11/2018 echo LVEF 55-60%, indet diastolic fxn - compliant with lasix      01-Dec-2021 echo: LVEF 60-65%, noWMAs, indet diastolic, normal RV, mild PASP 41 mmHg, severe LAE, RA pressure 15, severe TR - Jan pro BNP 1345 - SOB improvedHas not needed inhalers - taking lasix  daily.  - home scales 273 lbs. Rarely       Other medical problems not addressed this visit   1. 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    -compliant with meds - no recent palpiations - no recent bleeding on eliquis   2. HTN - reports some white coat HTN in the past     Home bp's 120s/70s - she is on prednisone for URI    3. OSA - not using a cpap, has not been interested in        4. Chronic venous insufficiency - followed by vascular Dr Hart [place] - swelling up and down.  - previously seen in wound clinic.  - wears compression stockings   - weight down to 276 from 306 last visit. Reports significant dieatary changes - swelling improving in the legs - has compression socks     - overall controlled with lasix.     5. Large complex cystic mass - noted by [place] medial right thigh, stable from prior US 4 months ago - normal Hgb on check, we have continued anticoag. There was some question if this could be a hematoma       SH: her significant other is Renold Genta who is also a patient of mine who passed away December 02, 2019 Past Medical History:  Diagnosis Date   Anemia    Anxiety    Arthritis    Back pain    reason unknown   Cataract    bilateral immature   Diarrhea    Diverticulosis    GERD (gastroesophageal  reflux disease)    takes Omeprazole daily   Headache(784.0)    occasionally   Hiatal hernia    History of blood transfusion    no abnormal reaction noted   History of bronchitis 2014   History of colon polyps    History of shingles    Hypertension    takes Metoprolol,Amlodipine and Losartan daily   Internal hemorrhoids    Joint pain    Joint swelling    Lung nodule    Nocturia    Numbness    hands occasionally   Osteoarthritis    Peripheral edema    takes HCTZ daily and Furosemide daily as needed   PONV (postoperative nausea and vomiting)    Redness    to both lower extremities   Shortness of breath    WITH EXERTION    Sleep apnea    doesn't use cpap   Urinary frequency    Urinary urgency    Venous insufficiency      Allergies  Allergen Reactions   Sulfa Antibiotics     Reaction unknown   Adhesive [Tape] Other (See Comments) and Rash    Skin is very sensitive Skin is very sensitive Skin is very sensitive  Current Outpatient Medications  Medication Sig Dispense Refill   acetaminophen (TYLENOL) 500 MG tablet Take 500 mg by mouth every 6 (six) hours as needed. For pain     albuterol (VENTOLIN HFA) 108 (90 Base) MCG/ACT inhaler Inhale 2 puffs into the lungs every 6 (six) hours as needed for wheezing or shortness of breath. 1 each 6   apixaban (ELIQUIS) 5 MG TABS tablet Take 1 tablet (5 mg total) by mouth 2 (two) times daily. 180 tablet 3   Biotin 5000 MCG CAPS Take 1 capsule by mouth daily.     budesonide-formoterol (SYMBICORT) 160-4.5 MCG/ACT inhaler Inhale 2 puffs into the lungs in the morning and at bedtime. 1 each 11   Cholecalciferol (VITAMIN D3) 10000 units capsule Take 10,000 Units by mouth daily.     furosemide (LASIX) 20 MG tablet Take 2 tablets (40 mg total) by mouth daily.     Glucosamine-Chondroitin (GLUCOSAMINE CHONDR COMPLEX PO) Take 1 capsule by mouth daily.      guaiFENesin (MUCINEX) 600 MG 12 hr tablet Take 600 mg by mouth 2 (two) times daily as  needed for cough or to loosen phlegm.     irbesartan (AVAPRO) 150 MG tablet Take 150 mg by mouth daily.     metoprolol tartrate (LOPRESSOR) 50 MG tablet TAKE 1 TABLET BY MOUTH TWICE A DAY 180 tablet 1   Omega-3 Fatty Acids (OMEGA-3 FISH OIL PO) Take 1 capsule by mouth daily.     omeprazole (PRILOSEC) 20 MG capsule Take 20 mg by mouth daily.     potassium gluconate 595 MG TABS tablet Take 1,190 mg by mouth daily.      triamcinolone lotion (KENALOG) 0.1 % Apply 1 application topically 3 (three) times daily.     vitamin B-12 (CYANOCOBALAMIN) 1000 MCG tablet Take 1,000 mcg by mouth daily.     No current facility-administered medications for this visit.     Past Surgical History:  Procedure Laterality Date   ABDOMINAL HYSTERECTOMY  1983   ANKLE SURGERY Left 1993   ANTERIOR CRUCIATE LIGAMENT REPAIR Left 2013   BREAST SURGERY     LEFT SIDE BIOPSY 1989   CHOLECYSTECTOMY  1990   COLONOSCOPY     ENDOVENOUS ABLATION SAPHENOUS VEIN W/ LASER Right 04/17/2017   endovenous laser ablation R [facility]SV by Josephina Gip MD   ESOPHAGOGASTRODUODENOSCOPY     HAND SURGERY Left 2011   QUADRICEPS TENDON REPAIR  07/16/2012   Procedure: REPAIR QUADRICEP TENDON;  Surgeon: Raymon Mutton, MD;  Location: [facility] OR;  Service: Orthopedics;  Laterality: Left;  open repair of VMO    quiadricp  10'21/2012   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   Procedure: TOTAL KNEE ARTHROPLASTY;  Surgeon: Raymon Mutton, MD;  Location: [facility] OR;  Service: Orthopedics;  Laterality: Left;   TOTAL KNEE ARTHROPLASTY Right 11/04/2013   DR Sherlean Foot   TOTAL KNEE ARTHROPLASTY Right 11/04/2013   Procedure: RIGHT TOTAL KNEE ARTHROPLASTY;  Surgeon: Dannielle Huh, MD;  Location: [facility] OR;  Service: Orthopedics;  Laterality: Right;   TUBAL LIGATION  1974   vasculitic eruption of legs  1985     Allergies  Allergen Reactions   Sulfa Antibiotics     Reaction unknown   Adhesive [Tape] Other (See Comments) and Rash    Skin is very sensitive Skin is very  sensitive Skin is very sensitive      Family History  Problem Relation Age of Onset   Lymphoma Father    Leukemia Father  Heart attack Father    Hypertension Mother    Emphysema Mother    Heart disease Sister    Rashes / Skin problems Sister    Heart failure Sister    Kidney disease Sister    Heart disease Maternal Grandfather    Heart disease Paternal Grandmother    Heart disease Paternal Grandfather      Social History [name] reports that she has never smoked. She has never used smokeless tobacco. [name] reports no history of alcohol use.   Review of Systems CONSTITUTIONAL: No weight loss, fever, chills, weakness or fatigue.  HEENT: Eyes: No visual loss, blurred vision, double vision or yellow sclerae.No hearing loss, sneezing, congestion, runny nose or sore throat.  SKIN: No rash or itching.  CARDIOVASCULAR: per hpi RESPIRATORY: per hpi GASTROINTESTINAL: No anorexia, nausea, vomiting or diarrhea. No abdominal pain or blood.  GENITOURINARY: No burning on urination, no polyuria NEUROLOGICAL: No headache, dizziness, syncope, paralysis, ataxia, numbness or tingling in the extremities. No change in bowel or bladder control.  MUSCULOSKELETAL: No muscle, back pain, joint pain or stiffness.  LYMPHATICS: No enlarged nodes. No history of splenectomy.  PSYCHIATRIC: No history of depression or anxiety.  ENDOCRINOLOGIC: No reports of sweating, cold or heat intolerance. No polyuria or polydipsia.  Marland Kitchen   Physical Examination Today's Vitals   02/11/22 1358  BP: 138/82  Pulse: 85  SpO2: 96%  Weight: 276 lb 6.4 oz (125.4 kg)  Height: 5\' 8"  (1.727 m)   Body mass index is 42.03 kg/m.  Gen: resting comfortably, no acute distress HEENT: no scleral icterus, pupils equal round and reactive, no palptable cervical adenopathy,  CV: irreg, +JVD Resp: Clear to auscultation bilaterally GI: abdomen is soft, non-tender, non-distended, normal bowel sounds, no  hepatosplenomegaly MSK: extremities are warm, no edema.  Skin: warm, no rash Neuro:  no focal deficits Psych: appropriate affect   Diagnostic Studies 11/2018 echo IMPRESSIONS      1. The left ventricle has normal systolic function, with an ejection fraction of 55-60%. The cavity size was normal. There is moderate concentric left ventricular hypertrophy. Left ventricular diastolic Doppler parameters are indeterminate secondary to  atrial fibrillation. No evidence of left ventricular regional wall motion abnormalities.  2. The right ventricle has normal systolic function. The cavity was normal. There is no increase in right ventricular wall thickness.  3. Left atrial size was moderate to severely dilated.  4. Right atrial size was mildly dilated.  5. There is mild mitral annular calcification present.  6. The tricuspid valve is grossly normal.  7. The aortic valve is tricuspid.  8. The aortic root is normal in size and structure.   10/2021 echo 1. Left ventricular ejection fraction, by estimation, is 60 to 65%. The  left ventricle has normal function. The left ventricle has no regional  wall motion abnormalities. There is moderate left ventricular hypertrophy.  Left ventricular diastolic  parameters are indeterminate.   2. Right ventricular systolic function is normal. The right ventricular  size is mildly enlarged. There is mildly elevated pulmonary artery  systolic pressure. The estimated right ventricular systolic pressure is  40.6 mmHg.   3. Left atrial size was severely dilated.   4. Right atrial size was moderately dilated.   5. The pericardial effusion is circumferential.   6. The mitral valve is normal in structure. Trivial mitral valve  regurgitation. No evidence of mitral stenosis.   7. Tricuspid valve regurgitation is severe.   8. The aortic valve is normal  in structure. Aortic valve regurgitation is  not visualized. No aortic stenosis is present.   9. The inferior  vena cava is dilated in size with <50% respiratory  variability, suggesting right atrial pressure of 15 mmHg.    Assessment and Plan   1.Chronic diastolic HF - symptoms on more regular dosing of diuretic, taking lasix 40mg  daily - ideal weight appears to be around 268-269 lbs, has trended up. Elevated JVD on exam. She will take lasix 60mg  x 2-3 days, then resume 40mg  daily with instructions to take additional 20mg  as needed - RV enlargement and TR I think secondary to left sided diastolic dysfunction,s eh has severe LAE suggesting long standing elevated LA pressures. Would focus on managing volume status and filling pressures with diuretic.     [name], M.D.

## 2022-02-11 NOTE — Patient Instructions (Addendum)
Medication Instructions:  Continue all current medications.   Labwork: none  Testing/Procedures: none  Follow-Up: 6 months   Any Other Special Instructions Will Be Listed Below (If Applicable).   If you need a refill on your cardiac medications before your next appointment, please call your pharmacy.  

## 2022-02-14 ENCOUNTER — Other Ambulatory Visit: Payer: Self-pay | Admitting: Cardiology

## 2022-02-17 ENCOUNTER — Telehealth: Payer: Self-pay | Admitting: *Deleted

## 2022-02-17 NOTE — Telephone Encounter (Signed)
Received fax from [facility] - approved for Eliquis from 02/16/2022 through 09/25/2022.

## 2022-04-26 ENCOUNTER — Ambulatory Visit (INDEPENDENT_AMBULATORY_CARE_PROVIDER_SITE_OTHER): Payer: Medicare Other | Admitting: General Surgery

## 2022-04-26 ENCOUNTER — Encounter: Payer: Self-pay | Admitting: General Surgery

## 2022-04-26 VITALS — BP 144/82 | HR 78 | Temp 98.0°F | Resp 16 | Ht 68.0 in | Wt 275.0 lb

## 2022-04-26 DIAGNOSIS — L03115 Cellulitis of right lower limb: Secondary | ICD-10-CM | POA: Diagnosis not present

## 2022-04-26 MED ORDER — DOXYCYCLINE HYCLATE 50 MG PO CAPS: 100.0000 mg | ORAL_CAPSULE | Freq: Two times a day (BID) | ORAL | 0 refills | Status: DC

## 2022-04-26 NOTE — Progress Notes (Signed)
Subjective:     [name]  Patient here for a right thigh wound check.  Patient started having swelling and redness at the site of her previous fluid collection.  This occurred approximately 2 to 3 weeks ago.  She she had been seen by an urgent care center.  She was started on clindamycin for 10 days.  She was also noted to be anemic at that time and she was found to be iron deficient.  She has completed the course of the clindamycin.  The swelling is a lot less and seems to have coalesced into 2 areas centrally on the right medial aspect of the thigh.  No purulent drainage is noted.  It is less tender than it was before.  Patient does take Eliquis. Objective:    BP (!) 144/82   Pulse 78   Temp 98 F (36.7 C) (Oral)   Resp 16   Ht 5\' 8"  (1.727 m)   Wt 275 lb (124.7 kg)   LMP 02/07/2012   SpO2 95%   BMI 41.81 kg/m   General:  alert, cooperative, and no distress  Right mid inner thigh with an area of erythema and slight fluctuance that is approximately 3 to 4 cm in its greatest diameter.  No significant deeper induration is noted.  This cannot be lanced as she has been taking her Eliquis.     Assessment:    Cellulitis with possible early abscess, right thigh    Plan:   She would like to try another course of the different antibiotic, which is fine with me.  She is nervous about stopping her Eliquis.  She states she has a distant unknown allergy to sulfa.  We will place her on doxycycline 100 mg p.o. twice daily x10 days.  Follow-up here in 3 weeks for reexamination.

## 2022-05-17 ENCOUNTER — Encounter: Payer: Self-pay | Admitting: General Surgery

## 2022-05-17 ENCOUNTER — Ambulatory Visit (INDEPENDENT_AMBULATORY_CARE_PROVIDER_SITE_OTHER): Payer: Medicare Other | Admitting: General Surgery

## 2022-05-17 VITALS — BP 137/76 | HR 84 | Temp 98.0°F | Resp 18 | Ht 68.0 in | Wt 265.0 lb

## 2022-05-17 DIAGNOSIS — L03115 Cellulitis of right lower limb: Secondary | ICD-10-CM | POA: Diagnosis not present

## 2022-05-17 DIAGNOSIS — N183 Chronic kidney disease, stage 3 unspecified: Secondary | ICD-10-CM | POA: Insufficient documentation

## 2022-05-17 DIAGNOSIS — Q401 Congenital hiatus hernia: Secondary | ICD-10-CM | POA: Insufficient documentation

## 2022-05-17 NOTE — Progress Notes (Signed)
Subjective:     [name]  Patient here for follow-up wound check.  She states she has had some clear yellow drainage from the inferior aspect of her right thigh wound.  She has completed her course of doxycycline. Objective:    BP 137/76   Pulse 84   Temp 98 F (36.7 C) (Oral)   Resp 18   Ht 5\' 8"  (1.727 m)   Wt 265 lb (120.2 kg)   LMP 02/07/2012   SpO2 98%   BMI 40.29 kg/m   General:  alert, cooperative, and no distress  Right thigh wound still with mild erythema and moderate induration.  It has decreased in size since I last saw her.  No active drainage is noted.  No purulent drainage is present.     Assessment:    Cellulitis, right thigh, resolving.    Plan:   Patient was told to follow-up with me as needed.  No need for acute surgical intervention at this time.

## 2022-08-07 ENCOUNTER — Other Ambulatory Visit: Payer: Self-pay | Admitting: Cardiology

## 2022-08-25 ENCOUNTER — Encounter: Payer: Self-pay | Admitting: Cardiology

## 2022-08-25 ENCOUNTER — Ambulatory Visit: Payer: Medicare Other | Attending: Cardiology | Admitting: Cardiology

## 2022-08-25 VITALS — BP 122/72 | HR 76 | Ht 68.0 in | Wt 275.8 lb

## 2022-08-25 DIAGNOSIS — I4819 Other persistent atrial fibrillation: Secondary | ICD-10-CM

## 2022-08-25 DIAGNOSIS — I5032 Chronic diastolic (congestive) heart failure: Secondary | ICD-10-CM

## 2022-08-25 DIAGNOSIS — I10 Essential (primary) hypertension: Secondary | ICD-10-CM

## 2022-08-25 MED ORDER — FUROSEMIDE 20 MG PO TABS: 40.0000 mg | ORAL_TABLET | Freq: Every day | ORAL | 6 refills | Status: DC

## 2022-08-25 NOTE — Patient Instructions (Signed)
Medication Instructions:  Stop HCTZ (Hydrochlorothiazide) Change your Lasix to 40mg  daily, may take 1/2 tab extra as needed for weight greater than 270lb Continue all other medications.     Labwork: none  Testing/Procedures: none  Follow-Up: 6 months   Any Other Special Instructions Will Be Listed Below (If Applicable).   If you need a refill on your cardiac medications before your next appointment, please call your pharmacy.

## 2022-08-25 NOTE — Progress Notes (Signed)
Clinical Summary Ms. Costella HatcherHamby is a 81 y.o.female seen today for follow up of the following medical problems.   1.DOE/Chronic diastolic HF - seen by pulmonary - 1 month history of DOE - transient improvement with prednisone, also some improvement with prn albuterol. 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 CXR did shoe vascular congestion, small pleural effusions, mild CHF - 11/2018 echo LVEF 55-60%, indet diastolic fxn - compliant with lasix       10/2021 echo: LVEF 60-65%, noWMAs, indet diastolic, normal RV, mild PASP 41 mmHg, severe LAE, RA pressure 15, severe TR - Jan pro BNP 1345      - home weights 265-272 lbs. Takes lasix 40mg  daily, not taking her HCTZ.  - swelling is stable though can have some weeping from legs. Breathing doing well.      2.HTN - home bp's 100-120s/50s-60s    3. 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  - no symptoms    4. OSA - not using a cpap, has not been interested in        5. Chronic venous insufficiency - followed by vascular Dr Hart RochesterLawson - swelling up and down.  - previously seen in wound clinic.  - wears compression stockings  - has compression socks        6. Large complex cystic mass - noted by US medial right thigh, stable from prior US 4 months ago - normal Hgb on check, we have continued anticoag. There was some question if this could be a hematoma       SH: her significant other is [name] who is also a patient of mine who passed away 10/2019 Past Medical History:  Diagnosis Date   Anemia    Anxiety    Arthritis    Back pain    reason unknown   Cataract    bilateral immature   Diarrhea    Diverticulosis    GERD (gastroesophageal reflux disease)    takes Omeprazole daily   Headache(784.0)    occasionally   Hiatal hernia    History of blood transfusion    no abnormal reaction noted   History of bronchitis 2014   History of colon  polyps    History of shingles    Hypertension    takes Metoprolol,Amlodipine and Losartan daily   Internal hemorrhoids    Joint pain    Joint swelling    Lung nodule    Nocturia    Numbness    hands occasionally   Osteoarthritis    Peripheral edema    takes HCTZ daily and Furosemide daily as needed   PONV (postoperative nausea and vomiting)    Redness    to both lower extremities   Shortness of breath    WITH EXERTION    Sleep apnea    doesn't use cpap   Urinary frequency    Urinary urgency    Venous insufficiency      Allergies  Allergen Reactions   Sulfa Antibiotics     Reaction unknown   Adhesive [Tape] Other (See Comments) and Rash    Skin is very sensitive Skin is very sensitive Skin is very sensitive     Current Outpatient Medications  Medication Sig Dispense Refill   acetaminophen (TYLENOL) 500 MG tablet Take 500 mg by mouth every 6 (six) hours as needed. For pain     albuterol (VENTOLIN HFA) 108 (90  Base) MCG/ACT inhaler Inhale 2 puffs into the lungs every 6 (six) hours as needed for wheezing or shortness of breath. 1 each 6   apixaban (ELIQUIS) 5 MG TABS tablet Take 1 tablet (5 mg total) by mouth 2 (two) times daily. 180 tablet 3   Biotin 5000 MCG CAPS Take 1 capsule by mouth daily.     budesonide-formoterol (SYMBICORT) 160-4.5 MCG/ACT inhaler Inhale 2 puffs into the lungs in the morning and at bedtime. 1 each 11   Calcium Citrate-Vitamin D 500-12.5 MG-MCG PACK Take by oral route.     Cholecalciferol (VITAMIN D3) 10000 units capsule Take 10,000 Units by mouth daily.     ferrous sulfate 325 (65 FE) MG tablet Take by mouth.     furosemide (LASIX) 20 MG tablet Take 2 tablets (40 mg total) by mouth daily.     Glucosamine-Chondroitin (GLUCOSAMINE CHONDR COMPLEX PO) Take 1 capsule by mouth daily.      guaiFENesin (MUCINEX) 600 MG 12 hr tablet Take 600 mg by mouth 2 (two) times daily as needed for cough or to loosen phlegm.     hydrochlorothiazide (HYDRODIURIL) 25  MG tablet Take 1 tablet every day by oral route.     irbesartan (AVAPRO) 150 MG tablet Take 150 mg by mouth daily.     metoprolol tartrate (LOPRESSOR) 50 MG tablet TAKE 1 TABLET BY MOUTH TWICE A DAY 180 tablet 2   Omega-3 Fatty Acids (OMEGA-3 FISH OIL PO) Take 1 capsule by mouth daily.     omeprazole (PRILOSEC) 20 MG capsule Take 20 mg by mouth daily as needed.     omeprazole (PRILOSEC) 40 MG capsule Take 40 mg by mouth every morning.     potassium gluconate 595 MG TABS tablet Take 1,190 mg by mouth daily.      triamcinolone lotion (KENALOG) 0.1 % Apply 1 application  topically 3 (three) times daily.     vitamin B-12 (CYANOCOBALAMIN) 1000 MCG tablet Take 1,000 mcg by mouth daily.     vitamin E 1000 UNIT capsule Take 1,000 Units by mouth daily.     No current facility-administered medications for this visit.     Past Surgical History:  Procedure Laterality Date   ABDOMINAL HYSTERECTOMY  1983   ANKLE SURGERY Left 1993   ANTERIOR CRUCIATE LIGAMENT REPAIR Left 2013   BREAST SURGERY     LEFT SIDE BIOPSY 1989   CHOLECYSTECTOMY  1990   COLONOSCOPY     ENDOVENOUS ABLATION SAPHENOUS VEIN W/ LASER Right 04/17/2017   endovenous laser ablation R [facility]SV by Josephina Gip MD   ESOPHAGOGASTRODUODENOSCOPY     HAND SURGERY Left 2011   QUADRICEPS TENDON REPAIR  07/16/2012   Procedure: REPAIR QUADRICEP TENDON;  Surgeon: Raymon Mutton, MD;  Location: [facility] OR;  Service: Orthopedics;  Laterality: Left;  open repair of VMO    quiadricp  10'21/2012   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   Procedure: TOTAL KNEE ARTHROPLASTY;  Surgeon: Raymon Mutton, MD;  Location: [facility] OR;  Service: Orthopedics;  Laterality: Left;   TOTAL KNEE ARTHROPLASTY Right 11/04/2013   DR Sherlean Foot   TOTAL KNEE ARTHROPLASTY Right 11/04/2013   Procedure: RIGHT TOTAL KNEE ARTHROPLASTY;  Surgeon: Dannielle Huh, MD;  Location: [facility] OR;  Service: Orthopedics;  Laterality: Right;   TUBAL LIGATION  1974   vasculitic eruption of legs  1985      Allergies  Allergen Reactions   Sulfa Antibiotics     Reaction unknown   Adhesive [Tape] Other (See Comments)  and Rash    Skin is very sensitive Skin is very sensitive Skin is very sensitive      Family History  Problem Relation Age of Onset   Lymphoma Father    Leukemia Father    Heart attack Father    Hypertension Mother    Emphysema Mother    Heart disease Sister    Rashes / Skin problems Sister    Heart failure Sister    Kidney disease Sister    Heart disease Maternal Grandfather    Heart disease Paternal Grandmother    Heart disease Paternal Grandfather      Social History Ms. Suleiman reports that she has never smoked. She has never used smokeless tobacco. Ms. Bartel reports no history of alcohol use.   Review of Systems CONSTITUTIONAL: No weight loss, fever, chills, weakness or fatigue.  HEENT: Eyes: No visual loss, blurred vision, double vision or yellow sclerae.No hearing loss, sneezing, congestion, runny nose or sore throat.  SKIN: No rash or itching.  CARDIOVASCULAR: per hpi RESPIRATORY: No shortness of breath, cough or sputum.  GASTROINTESTINAL: No anorexia, nausea, vomiting or diarrhea. No abdominal pain or blood.  GENITOURINARY: No burning on urination, no polyuria NEUROLOGICAL: No headache, dizziness, syncope, paralysis, ataxia, numbness or tingling in the extremities. No change in bowel or bladder control.  MUSCULOSKELETAL: No muscle, back pain, joint pain or stiffness.  LYMPHATICS: No enlarged nodes. No history of splenectomy.  PSYCHIATRIC: No history of depression or anxiety.  ENDOCRINOLOGIC: No reports of sweating, cold or heat intolerance. No polyuria or polydipsia.  Marland Kitchen   Physical Examination Today's Vitals   08/25/22 1517  BP: 122/72  Pulse: 76  SpO2: 99%  Weight: 275 lb 12.8 oz (125.1 kg)  Height: 5\' 8"  (1.727 m)   Body mass index is 41.94 kg/m.  Gen: resting comfortably, no acute distress HEENT: no scleral icterus, pupils equal  round and reactive, no palptable cervical adenopathy,  CV: irreg no mrg, no jvd Resp: Clear to auscultation bilaterally GI: abdomen is soft, non-tender, non-distended, normal bowel sounds, no hepatosplenomegaly MSK: extremities are warm, 1+ bilateral nonpitting edema Skin: warm, no rash Neuro:  no focal deficits Psych: appropriate affect   Diagnostic Studies  11/2018 echo IMPRESSIONS      1. The left ventricle has normal systolic function, with an ejection fraction of 55-60%. The cavity size was normal. There is moderate concentric left ventricular hypertrophy. Left ventricular diastolic Doppler parameters are indeterminate secondary to  atrial fibrillation. No evidence of left ventricular regional wall motion abnormalities.  2. The right ventricle has normal systolic function. The cavity was normal. There is no increase in right ventricular wall thickness.  3. Left atrial size was moderate to severely dilated.  4. Right atrial size was mildly dilated.  5. There is mild mitral annular calcification present.  6. The tricuspid valve is grossly normal.  7. The aortic valve is tricuspid.  8. The aortic root is normal in size and structure.     10/2021 echo 1. Left ventricular ejection fraction, by estimation, is 60 to 65%. The  left ventricle has normal function. The left ventricle has no regional  wall motion abnormalities. There is moderate left ventricular hypertrophy.  Left ventricular diastolic  parameters are indeterminate.   2. Right ventricular systolic function is normal. The right ventricular  size is mildly enlarged. There is mildly elevated pulmonary artery  systolic pressure. The estimated right ventricular systolic pressure is  40.6 mmHg.   3. Left atrial size was  severely dilated.   4. Right atrial size was moderately dilated.   5. The pericardial effusion is circumferential.   6. The mitral valve is normal in structure. Trivial mitral valve  regurgitation. No  evidence of mitral stenosis.   7. Tricuspid valve regurgitation is severe.   8. The aortic valve is normal in structure. Aortic valve regurgitation is  not visualized. No aortic stenosis is present.   9. The inferior vena cava is dilated in size with <50% respiratory  variability, suggesting right atrial pressure of 15 mmHg.    Assessment and Plan  1.Chronic diastolic HF - some ongoing edema and labile weights. Will change lasix to 40mg  daily, may take additional 20mg  as needed for weight 270lbs or higher  2. Persistent afib/acquired thrombophilia - no symptoms, continue current meds including eliquis for stroke prevention  3.HTN - at goal, continue current meds    , M.D.

## 2022-10-17 ENCOUNTER — Telehealth: Payer: Self-pay | Admitting: *Deleted

## 2022-10-17 NOTE — Telephone Encounter (Signed)
Patient came by office to drop off patient assistance paper work.  Wanted to let Dr. Harl Bowie know that she has not had any labs done by pcp since July last year.  Only had Hgb finger pricks.   Did you want her to have any current labs done?

## 2022-10-18 ENCOUNTER — Encounter: Payer: Self-pay | Admitting: *Deleted

## 2022-10-18 ENCOUNTER — Encounter: Payer: Self-pay | Admitting: Cardiology

## 2022-10-18 NOTE — Telephone Encounter (Signed)
Notified via mychart.

## 2022-10-18 NOTE — Telephone Encounter (Signed)
Can she get a bmet/mg please   Zandra Abts MD

## 2022-10-20 ENCOUNTER — Other Ambulatory Visit: Payer: Self-pay | Admitting: *Deleted

## 2022-10-20 MED ORDER — APIXABAN 5 MG PO TABS: 5.0000 mg | ORAL_TABLET | Freq: Two times a day (BID) | ORAL | 3 refills | Status: DC

## 2022-10-25 ENCOUNTER — Other Ambulatory Visit: Payer: Self-pay | Admitting: *Deleted

## 2022-10-25 DIAGNOSIS — I5032 Chronic diastolic (congestive) heart failure: Secondary | ICD-10-CM

## 2022-10-25 DIAGNOSIS — I10 Essential (primary) hypertension: Secondary | ICD-10-CM

## 2022-10-25 DIAGNOSIS — Z79899 Other long term (current) drug therapy: Secondary | ICD-10-CM

## 2022-10-28 ENCOUNTER — Telehealth: Payer: Self-pay | Admitting: Cardiology

## 2022-10-28 NOTE — Telephone Encounter (Signed)
Ot calling to request that lab orders be faxed to [facility] being that she is there to have labs drawn. Please advise

## 2022-10-28 NOTE — Telephone Encounter (Signed)
Advised that bmet & mg orders faxed to [facility] [phone].

## 2022-11-03 ENCOUNTER — Encounter: Payer: Self-pay | Admitting: *Deleted

## 2022-11-07 ENCOUNTER — Encounter: Payer: Self-pay | Admitting: Cardiology

## 2022-11-08 ENCOUNTER — Other Ambulatory Visit: Payer: Self-pay | Admitting: *Deleted

## 2022-11-08 DIAGNOSIS — I5032 Chronic diastolic (congestive) heart failure: Secondary | ICD-10-CM

## 2022-11-08 DIAGNOSIS — Z79899 Other long term (current) drug therapy: Secondary | ICD-10-CM

## 2022-11-08 MED ORDER — POTASSIUM CHLORIDE CRYS ER 20 MEQ PO TBCR: 40.0000 meq | EXTENDED_RELEASE_TABLET | Freq: Every day | ORAL | 3 refills | Status: DC

## 2022-11-08 MED ORDER — MAGNESIUM 400 MG PO TABS: 1.0000 | ORAL_TABLET | Freq: Two times a day (BID) | ORAL | 3 refills | Status: DC

## 2022-11-11 ENCOUNTER — Telehealth: Payer: Self-pay | Admitting: Cardiology

## 2022-11-11 NOTE — Telephone Encounter (Signed)
Ivy from [facility] called in stating they are still awaiting MD's portion of application for patient assistance for Eliquis

## 2022-11-14 ENCOUNTER — Encounter: Payer: Self-pay | Admitting: *Deleted

## 2022-11-14 NOTE — Telephone Encounter (Signed)
Replied via mychart.

## 2022-11-20 ENCOUNTER — Encounter: Payer: Self-pay | Admitting: Cardiology

## 2022-11-21 NOTE — Telephone Encounter (Signed)
Ok with higher lasix dosing, can she get a PA appt please  Zandra Abts MD

## 2022-11-23 ENCOUNTER — Ambulatory Visit (HOSPITAL_COMMUNITY)
Admission: RE | Admit: 2022-11-23 | Discharge: 2022-11-23 | Disposition: A | Discharge status: Home / Self Care | Payer: Medicare Other | Source: Ambulatory Visit | Attending: Nurse Practitioner | Admitting: Nurse Practitioner

## 2022-11-23 ENCOUNTER — Ambulatory Visit (INDEPENDENT_AMBULATORY_CARE_PROVIDER_SITE_OTHER): Payer: Medicare Other | Admitting: Nurse Practitioner

## 2022-11-23 ENCOUNTER — Ambulatory Visit (HOSPITAL_COMMUNITY): Admission: RE | Admit: 2022-11-23 | Payer: Medicare Other | Source: Ambulatory Visit

## 2022-11-23 ENCOUNTER — Other Ambulatory Visit (HOSPITAL_COMMUNITY)
Admission: RE | Admit: 2022-11-23 | Discharge: 2022-11-23 | Disposition: A | Discharge status: Home / Self Care | Payer: Medicare Other | Source: Ambulatory Visit | Attending: Cardiology | Admitting: Cardiology

## 2022-11-23 ENCOUNTER — Encounter: Payer: Self-pay | Admitting: Nurse Practitioner

## 2022-11-23 VITALS — BP 134/78 | HR 81 | Ht 68.0 in | Wt 289.0 lb

## 2022-11-23 DIAGNOSIS — G44319 Acute post-traumatic headache, not intractable: Secondary | ICD-10-CM

## 2022-11-23 DIAGNOSIS — T148XXA Other injury of unspecified body region, initial encounter: Secondary | ICD-10-CM

## 2022-11-23 DIAGNOSIS — W19XXXA Unspecified fall, initial encounter: Secondary | ICD-10-CM

## 2022-11-23 DIAGNOSIS — I890 Lymphedema, not elsewhere classified: Secondary | ICD-10-CM

## 2022-11-23 DIAGNOSIS — S0990XA Unspecified injury of head, initial encounter: Secondary | ICD-10-CM | POA: Insufficient documentation

## 2022-11-23 DIAGNOSIS — M7989 Other specified soft tissue disorders: Secondary | ICD-10-CM | POA: Insufficient documentation

## 2022-11-23 DIAGNOSIS — I5032 Chronic diastolic (congestive) heart failure: Secondary | ICD-10-CM

## 2022-11-23 LAB — BASIC METABOLIC PANEL
Anion gap: 10 (ref 5–15)
BUN: 25 mg/dL — ABNORMAL HIGH (ref 8–23)
CO2: 29 mmol/L (ref 22–32)
Calcium: 9.7 mg/dL (ref 8.9–10.3)
Chloride: 99 mmol/L (ref 98–111)
Creatinine, Ser: 1.28 mg/dL — ABNORMAL HIGH (ref 0.44–1.00)
GFR, Estimated: 42 mL/min — ABNORMAL LOW (ref >60)
Glucose, Bld: 130 mg/dL — ABNORMAL HIGH (ref 70–99)
Potassium: 4.6 mmol/L (ref 3.5–5.1)
Sodium: 138 mmol/L (ref 135–145)

## 2022-11-23 LAB — MAGNESIUM: Magnesium: 2 mg/dL (ref 1.7–2.4)

## 2022-11-23 NOTE — Progress Notes (Signed)
Office Visit    Patient Name: [name] Date of Encounter: 11/23/2022  PCP:  Cathie Olden, MD   [place]  Cardiologist:  Carlyle Dolly, MD  Advanced Practice Provider:  Finis Bud, NP Electrophysiologist:  None   Chief Complaint    [name] is a 82 y.o. female with a hx of chronic diastolic CHF, dyspnea on exertion, persistent A-fib, HTN, OSA, chronic venous insufficiency, and lymphedema who presents today for evaluation for weight gain.   Past Medical History    Past Medical History:  Diagnosis Date   Anemia    Anxiety    Arthritis    Back pain    reason unknown   Cataract    bilateral immature   Diarrhea    Diverticulosis    GERD (gastroesophageal reflux disease)    takes Omeprazole daily   Headache(784.0)    occasionally   Hiatal hernia    History of blood transfusion    no abnormal reaction noted   History of bronchitis 2014   History of colon polyps    History of shingles    Hypertension    takes Metoprolol,Amlodipine and Losartan daily   Internal hemorrhoids    Joint pain    Joint swelling    Lung nodule    Nocturia    Numbness    hands occasionally   Osteoarthritis    Peripheral edema    takes HCTZ daily and Furosemide daily as needed   PONV (postoperative nausea and vomiting)    Redness    to both lower extremities   Shortness of breath    WITH EXERTION    Sleep apnea    doesn't use cpap   Urinary frequency    Urinary urgency    Venous insufficiency    Past Surgical History:  Procedure Laterality Date   ABDOMINAL HYSTERECTOMY  1983   ANKLE SURGERY Left 1993   ANTERIOR CRUCIATE LIGAMENT REPAIR Left 2013   BREAST SURGERY     LEFT SIDE BIOPSY 1989   CHOLECYSTECTOMY  1990   COLONOSCOPY     ENDOVENOUS ABLATION SAPHENOUS VEIN W/ LASER Right 04/17/2017   endovenous laser ablation R GSV by Tinnie Gens MD   ESOPHAGOGASTRODUODENOSCOPY     HAND SURGERY Left 2011   [place]  07/16/2012   Procedure: REPAIR QUADRICEP TENDON;  Surgeon: Rudean Haskell, MD;  Location: [place];  Service: Orthopedics;  Laterality: Left;  open repair of VMO    quiadricp  10'21/2012   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   Procedure: TOTAL KNEE ARTHROPLASTY;  Surgeon: Rudean Haskell, MD;  Location: [place];  Service: Orthopedics;  Laterality: Left;   TOTAL KNEE ARTHROPLASTY Right 11/04/2013   DR Ronnie Derby   TOTAL KNEE ARTHROPLASTY Right 11/04/2013   Procedure: RIGHT TOTAL KNEE ARTHROPLASTY;  Surgeon: Vickey Huger, MD;  Location: [place];  Service: Orthopedics;  Laterality: Right;   TUBAL LIGATION  1974   vasculitic eruption of legs  1985    Allergies  Allergies  Allergen Reactions   Sulfa Antibiotics     Reaction unknown   Adhesive [Tape] Other (See Comments) and Rash    Skin is very sensitive Skin is very sensitive Skin is very sensitive    History of Present Illness    [name] is a 82 y.o. female with a PMH as mentioned above.  Last seen by Dr. Carlyle Dolly on 08/25/2022. At the time, patient noted some ongoing edema and labile  weights. Lasix dosage was adjusted.   Patient presents with family today. States has noticed weight gain for a while, patient increased Lasix to 60 mg daily by herself. Has had good results with this and weight has been trending down. Denies any chest pain, shortness of breath, palpitations, syncope, presyncope, dizziness, orthopnea, PND, acute bleeding, or claudication. Recent mechanical fall, admits to facial bruising, denies any red flag signs of symptoms.  EKGs/Labs/Other Studies Reviewed:   The following studies were reviewed today:   EKG:  EKG is not ordered today.    Echo on 11/08/2021:  1. Left ventricular ejection fraction, by estimation, is 60 to 65%. The  left ventricle has normal function. The left ventricle has no regional  wall motion abnormalities. There is moderate left ventricular hypertrophy.  Left ventricular diastolic   parameters are indeterminate.   2. Right ventricular systolic function is normal. The right ventricular  size is mildly enlarged. There is mildly elevated pulmonary artery  systolic pressure. The estimated right ventricular systolic pressure is  [id number] mmHg.   3. Left atrial size was severely dilated.   4. Right atrial size was moderately dilated.   5. The pericardial effusion is circumferential.   6. The mitral valve is normal in structure. Trivial mitral valve  regurgitation. No evidence of mitral stenosis.   7. Tricuspid valve regurgitation is severe.   8. The aortic valve is normal in structure. Aortic valve regurgitation is  not visualized. No aortic stenosis is present.   9. The inferior vena cava is dilated in size with <50% respiratory  variability, suggesting right atrial pressure of 15 mmHg.  Recent Labs: 11/23/2022: BUN 25; Creatinine, Ser 1.28; Magnesium 2.0; Potassium 4.6; Sodium 138  Recent Lipid Panel No results found for: "CHOL", "TRIG", "HDL", "CHOLHDL", "VLDL", "LDLCALC", "LDLDIRECT"  Risk Assessment/Calculations:   CHA2DS2-VASc Score = 5  This indicates a 7.2% annual risk of stroke. The patient's score is based upon: CHF History: 1 HTN History: 1 Diabetes History: 0 Stroke History: 0 Vascular Disease History: 0 Age Score: 2 Gender Score: 1    Home Medications   Current Meds  Medication Sig   acetaminophen (TYLENOL) 500 MG tablet Take 500 mg by mouth every 6 (six) hours as needed. For pain   albuterol (VENTOLIN HFA) 108 (90 Base) MCG/ACT inhaler Inhale 2 puffs into the lungs every 6 (six) hours as needed for wheezing or shortness of breath.   apixaban (ELIQUIS) 5 MG TABS tablet Take 1 tablet (5 mg total) by mouth 2 (two) times daily.   Biotin 5000 MCG CAPS Take 1 capsule by mouth daily.   Calcium Citrate-Vitamin D 500-12.5 MG-MCG PACK Take by oral route.   Cholecalciferol (VITAMIN D3) 10000 units capsule Take 10,000 Units by mouth daily.   ferrous sulfate  325 (65 FE) MG tablet Take by mouth.   furosemide (LASIX) 20 MG tablet Take 2 tablets (40 mg total) by mouth daily. (May take an additional '20mg'$  as needed for weight gain greater than 270lb.)   guaiFENesin (MUCINEX) 600 MG 12 hr tablet Take 600 mg by mouth 2 (two) times daily as needed for cough or to loosen phlegm.   irbesartan (AVAPRO) 150 MG tablet Take 150 mg by mouth daily.   Magnesium 400 MG TABS Take 1 tablet by mouth 2 (two) times daily. For 4 days, then reduce to 400 mg daily   metoprolol tartrate (LOPRESSOR) 50 MG tablet TAKE 1 TABLET BY MOUTH TWICE A DAY   potassium chloride SA (KLOR-CON M)  20 MEQ tablet Take 2 tablets (40 mEq total) by mouth daily.   triamcinolone lotion (KENALOG) 0.1 % Apply 1 application  topically 3 (three) times daily.   vitamin B-12 (CYANOCOBALAMIN) 1000 MCG tablet Take 1,000 mcg by mouth daily.   vitamin E 1000 UNIT capsule Take 1,000 Units by mouth daily.     Review of Systems    All other systems reviewed and are otherwise negative except as noted above.  Physical Exam    VS:  BP 134/78   Pulse 81   Ht '5\' 8"'$  (1.727 m)   Wt 289 lb (131.1 kg)   LMP 02/07/2012   SpO2 99%   BMI 43.94 kg/m  , BMI Body mass index is 43.94 kg/m.  Wt Readings from Last 3 Encounters:  11/23/22 289 lb (131.1 kg)  08/25/22 275 lb 12.8 oz (125.1 kg)  05/17/22 265 lb (120.2 kg)     GEN: Well nourished, well developed, in no acute distress. HEENT: normal. Neck: Supple, no JVD, carotid bruits, or masses. Cardiac: S1/S2, irregular rhythm and regular rate, no murmurs, rubs, or gallops. No clubbing, cyanosis. Lymphedema noted.  Radials 2+/PT 1+ and equal bilaterally.  Respiratory:  Respirations regular and unlabored, clear to auscultation bilaterally. GI: Soft, nontender, nondistended. Skin: Warm and dry, no rash, scaling legs, bruising noted to face, scattered and slight swelling what appears to be hematoma noted to forehead. Neuro:  Strength and sensation are  intact. Psych: Normal affect.  Assessment & Plan    Chronic diastolic CHF, lymphedema/leg swelling/weight gain TTE 10/2021 showed EF 60-65%. Improved leg edema and weight is down. Overall euvolemic and well compensated on exam. Continue Lasix 60 mg daily. Continue current GDMT. Will obtain BMET and Mag level that was previously ordered. Will refer patient to lymphedema clinic. Low sodium diet, fluid restriction <2L, and daily weights encouraged. Educated to contact our office for weight gain of 2 lbs overnight or 5 lbs in one week.   Head injury, recent fall Recent mechanical fall. Denies any red flag symptoms, taking Tylenol for HA which helps. Will obtain non-contrast CT scan of head and neck STAT. Care precautions and ED precautions discussed. Heart healthy diet encouraged. Continue to follow with PCP.    Disposition: Follow up in 3-4 weeks with Carlyle Dolly, MD or APP.  Signed, Finis Bud, NP 11/27/2022, 10:12 PM [place]

## 2022-11-23 NOTE — Patient Instructions (Addendum)
Medication Instructions:  Your physician recommends that you continue on your current medications as directed. Please refer to the Current Medication list given to you today.   Labwork: Get lab work ordered by [facility]  Testing/Procedures: STAT head/neck CT at [place]: 3-4 weeks E.Peck.NP  Any Other Special Instructions Will Be Listed Below (If Applicable).  If you need a refill on your cardiac medications before your next appointment, please call your pharmacy.

## 2022-11-25 ENCOUNTER — Other Ambulatory Visit: Payer: Self-pay | Admitting: Cardiology

## 2022-11-27 ENCOUNTER — Other Ambulatory Visit: Payer: Self-pay

## 2022-11-27 ENCOUNTER — Emergency Department (HOSPITAL_COMMUNITY): Payer: Medicare Other

## 2022-11-27 ENCOUNTER — Encounter (HOSPITAL_COMMUNITY): Payer: Self-pay | Admitting: Emergency Medicine

## 2022-11-27 ENCOUNTER — Inpatient Hospital Stay (HOSPITAL_COMMUNITY)
Admission: EM | Admit: 2022-11-27 | Discharge: 2022-12-01 | DRG: 082 | Payer: Medicare Other | Attending: Neurosurgery | Admitting: Neurosurgery

## 2022-11-27 DIAGNOSIS — T45515A Adverse effect of anticoagulants, initial encounter: Secondary | ICD-10-CM | POA: Diagnosis present

## 2022-11-27 DIAGNOSIS — Z825 Family history of asthma and other chronic lower respiratory diseases: Secondary | ICD-10-CM

## 2022-11-27 DIAGNOSIS — I272 Pulmonary hypertension, unspecified: Secondary | ICD-10-CM | POA: Diagnosis not present

## 2022-11-27 DIAGNOSIS — I5033 Acute on chronic diastolic (congestive) heart failure: Secondary | ICD-10-CM | POA: Diagnosis not present

## 2022-11-27 DIAGNOSIS — Z9181 History of falling: Secondary | ICD-10-CM

## 2022-11-27 DIAGNOSIS — I071 Rheumatic tricuspid insufficiency: Secondary | ICD-10-CM | POA: Diagnosis present

## 2022-11-27 DIAGNOSIS — I4821 Permanent atrial fibrillation: Secondary | ICD-10-CM | POA: Diagnosis present

## 2022-11-27 DIAGNOSIS — S065XAA Traumatic subdural hemorrhage with loss of consciousness status unknown, initial encounter: Principal | ICD-10-CM

## 2022-11-27 DIAGNOSIS — R296 Repeated falls: Secondary | ICD-10-CM | POA: Diagnosis present

## 2022-11-27 DIAGNOSIS — Z6841 Body Mass Index (BMI) 40.0 and over, adult: Secondary | ICD-10-CM

## 2022-11-27 DIAGNOSIS — W19XXXA Unspecified fall, initial encounter: Secondary | ICD-10-CM | POA: Diagnosis present

## 2022-11-27 DIAGNOSIS — I872 Venous insufficiency (chronic) (peripheral): Secondary | ICD-10-CM | POA: Diagnosis present

## 2022-11-27 DIAGNOSIS — Z79899 Other long term (current) drug therapy: Secondary | ICD-10-CM

## 2022-11-27 DIAGNOSIS — Z841 Family history of disorders of kidney and ureter: Secondary | ICD-10-CM

## 2022-11-27 DIAGNOSIS — M1990 Unspecified osteoarthritis, unspecified site: Secondary | ICD-10-CM | POA: Diagnosis present

## 2022-11-27 DIAGNOSIS — Z882 Allergy status to sulfonamides status: Secondary | ICD-10-CM

## 2022-11-27 DIAGNOSIS — D6832 Hemorrhagic disorder due to extrinsic circulating anticoagulants: Secondary | ICD-10-CM | POA: Diagnosis present

## 2022-11-27 DIAGNOSIS — Z91048 Other nonmedicinal substance allergy status: Secondary | ICD-10-CM

## 2022-11-27 DIAGNOSIS — I619 Nontraumatic intracerebral hemorrhage, unspecified: Secondary | ICD-10-CM | POA: Diagnosis present

## 2022-11-27 DIAGNOSIS — R29705 NIHSS score 5: Secondary | ICD-10-CM | POA: Diagnosis present

## 2022-11-27 DIAGNOSIS — S066XAA Traumatic subarachnoid hemorrhage with loss of consciousness status unknown, initial encounter: Secondary | ICD-10-CM | POA: Diagnosis present

## 2022-11-27 DIAGNOSIS — Z7901 Long term (current) use of anticoagulants: Secondary | ICD-10-CM

## 2022-11-27 DIAGNOSIS — F419 Anxiety disorder, unspecified: Secondary | ICD-10-CM | POA: Diagnosis present

## 2022-11-27 DIAGNOSIS — R569 Unspecified convulsions: Secondary | ICD-10-CM | POA: Diagnosis present

## 2022-11-27 DIAGNOSIS — I890 Lymphedema, not elsewhere classified: Secondary | ICD-10-CM | POA: Diagnosis present

## 2022-11-27 DIAGNOSIS — I110 Hypertensive heart disease with heart failure: Secondary | ICD-10-CM | POA: Diagnosis present

## 2022-11-27 DIAGNOSIS — K219 Gastro-esophageal reflux disease without esophagitis: Secondary | ICD-10-CM | POA: Diagnosis present

## 2022-11-27 DIAGNOSIS — Z96651 Presence of right artificial knee joint: Secondary | ICD-10-CM | POA: Diagnosis present

## 2022-11-27 DIAGNOSIS — Z807 Family history of other malignant neoplasms of lymphoid, hematopoietic and related tissues: Secondary | ICD-10-CM

## 2022-11-27 DIAGNOSIS — Z806 Family history of leukemia: Secondary | ICD-10-CM

## 2022-11-27 DIAGNOSIS — Y92019 Unspecified place in single-family (private) house as the place of occurrence of the external cause: Secondary | ICD-10-CM

## 2022-11-27 DIAGNOSIS — G4733 Obstructive sleep apnea (adult) (pediatric): Secondary | ICD-10-CM | POA: Diagnosis present

## 2022-11-27 DIAGNOSIS — N179 Acute kidney failure, unspecified: Secondary | ICD-10-CM | POA: Diagnosis present

## 2022-11-27 DIAGNOSIS — F05 Delirium due to known physiological condition: Secondary | ICD-10-CM | POA: Diagnosis present

## 2022-11-27 DIAGNOSIS — S0083XA Contusion of other part of head, initial encounter: Secondary | ICD-10-CM | POA: Diagnosis present

## 2022-11-27 DIAGNOSIS — Z1152 Encounter for screening for COVID-19: Secondary | ICD-10-CM

## 2022-11-27 DIAGNOSIS — Z8249 Family history of ischemic heart disease and other diseases of the circulatory system: Secondary | ICD-10-CM

## 2022-11-27 DIAGNOSIS — R002 Palpitations: Secondary | ICD-10-CM | POA: Diagnosis present

## 2022-11-27 DIAGNOSIS — R451 Restlessness and agitation: Secondary | ICD-10-CM | POA: Diagnosis present

## 2022-11-27 DIAGNOSIS — Z86010 Personal history of colonic polyps: Secondary | ICD-10-CM

## 2022-11-27 DIAGNOSIS — Z8619 Personal history of other infectious and parasitic diseases: Secondary | ICD-10-CM

## 2022-11-27 LAB — COMPREHENSIVE METABOLIC PANEL
ALT: 15 U/L (ref 0–44)
AST: 24 U/L (ref 15–41)
Albumin: 3.9 g/dL (ref 3.5–5.0)
Alkaline Phosphatase: 81 U/L (ref 38–126)
Anion gap: 10 (ref 5–15)
BUN: 25 mg/dL — ABNORMAL HIGH (ref 8–23)
CO2: 25 mmol/L (ref 22–32)
Calcium: 9.5 mg/dL (ref 8.9–10.3)
Chloride: 102 mmol/L (ref 98–111)
Creatinine, Ser: 1.3 mg/dL — ABNORMAL HIGH (ref 0.44–1.00)
GFR, Estimated: 41 mL/min — ABNORMAL LOW (ref >60)
Glucose, Bld: 130 mg/dL — ABNORMAL HIGH (ref 70–99)
Potassium: 4.1 mmol/L (ref 3.5–5.1)
Sodium: 137 mmol/L (ref 135–145)
Total Bilirubin: 3.9 mg/dL — ABNORMAL HIGH (ref 0.3–1.2)
Total Protein: 7.5 g/dL (ref 6.5–8.1)

## 2022-11-27 LAB — CBC
HCT: 41.7 % (ref 36.0–46.0)
Hemoglobin: 13.3 g/dL (ref 12.0–15.0)
MCH: 31.4 pg (ref 26.0–34.0)
MCHC: 31.9 g/dL (ref 30.0–36.0)
MCV: 98.6 fL (ref 80.0–100.0)
Platelets: 131 10*3/uL — ABNORMAL LOW (ref 150–400)
RBC: 4.23 MIL/uL (ref 3.87–5.11)
RDW: 15.5 % (ref 11.5–15.5)
WBC: 7 10*3/uL (ref 4.0–10.5)
nRBC: 0 % (ref 0.0–0.2)

## 2022-11-27 LAB — DIFFERENTIAL
Abs Immature Granulocytes: 0.02 10*3/uL (ref 0.00–0.07)
Basophils Absolute: 0.1 10*3/uL (ref 0.0–0.1)
Basophils Relative: 1 %
Eosinophils Absolute: 0.2 10*3/uL (ref 0.0–0.5)
Eosinophils Relative: 3 %
Immature Granulocytes: 0 %
Lymphocytes Relative: 18 %
Lymphs Abs: 1.3 10*3/uL (ref 0.7–4.0)
Monocytes Absolute: 1 10*3/uL (ref 0.1–1.0)
Monocytes Relative: 14 %
Neutro Abs: 4.5 10*3/uL (ref 1.7–7.7)
Neutrophils Relative %: 64 %

## 2022-11-27 LAB — PROTIME-INR
INR: 1.4 — ABNORMAL HIGH (ref 0.8–1.2)
Prothrombin Time: 17 seconds — ABNORMAL HIGH (ref 11.4–15.2)

## 2022-11-27 LAB — APTT: aPTT: 35 seconds (ref 24–36)

## 2022-11-27 LAB — CBG MONITORING, ED: Glucose-Capillary: 130 mg/dL — ABNORMAL HIGH (ref 70–99)

## 2022-11-27 LAB — ETHANOL: Alcohol, Ethyl (B): <10 mg/dL (ref <10)

## 2022-11-27 MED ORDER — ONDANSETRON HCL 4 MG/2ML IJ SOLN
INTRAMUSCULAR | Status: AC
  Administered 2022-11-27: 4 mg via INTRAVENOUS
  Filled 2022-11-27: qty 2

## 2022-11-27 MED ORDER — ONDANSETRON HCL 4 MG/2ML IJ SOLN
4.0000 mg | Freq: Once | INTRAMUSCULAR | Status: AC
  Administered 2022-11-28: 4 mg via INTRAVENOUS
  Filled 2022-11-27: qty 2

## 2022-11-27 MED ORDER — ONDANSETRON HCL 4 MG/2ML IJ SOLN: 4.0000 mg | Freq: Once | INTRAMUSCULAR | Status: AC

## 2022-11-27 NOTE — ED Provider Notes (Signed)
[facility] Provider Note   CSN: [id number] Arrival date & time: 11/27/22  2306     History {Add pertinent medical, surgical, social history, OB history to HPI:1} Chief Complaint  Patient presents with   Fall   Level 5 caveat due to altered mental status [name] is a 82 y.o. female.  The history is provided by a relative. The history is limited by the condition of the patient.  Patient with extensive history including atrial fibrillation on Eliquis, obesity, CHF presents for altered mental status.  Daughter provides a history.  She reports that patient fell about a week ago.  She had outpatient CT scans that were negative.  Patient had another fall about 48 hours ago that was backward hitting her head.  Unknown LOC.  Since that time she has had increasing generalized weakness.  About 2 and half hours ago patient started having confusion and reporting flashing lights in her left eye.  She is now having nausea and vomiting. No focal weakness is reported per daughter.    Past Medical History:  Diagnosis Date   Anemia    Anxiety    Arthritis    Back pain    reason unknown   Cataract    bilateral immature   Diarrhea    Diverticulosis    GERD (gastroesophageal reflux disease)    takes Omeprazole daily   Headache(784.0)    occasionally   Hiatal hernia    History of blood transfusion    no abnormal reaction noted   History of bronchitis 2014   History of colon polyps    History of shingles    Hypertension    takes Metoprolol,Amlodipine and Losartan daily   Internal hemorrhoids    Joint pain    Joint swelling    Lung nodule    Nocturia    Numbness    hands occasionally   Osteoarthritis    Peripheral edema    takes HCTZ daily and Furosemide daily as needed   PONV (postoperative nausea and vomiting)    Redness    to both lower extremities   Shortness of breath    WITH EXERTION    Sleep apnea    doesn't use cpap    Urinary frequency    Urinary urgency    Venous insufficiency     Home Medications Prior to Admission medications   Medication Sig Start Date End Date Taking? Authorizing Provider  acetaminophen (TYLENOL) 500 MG tablet Take 500 mg by mouth every 6 (six) hours as needed. For pain    [provider]  albuterol (VENTOLIN HFA) 108 (90 Base) MCG/ACT inhaler Inhale 2 puffs into the lungs every 6 (six) hours as needed for wheezing or shortness of breath. 10/26/21   Hunsucker, Bonna Gains, MD  apixaban (ELIQUIS) 5 MG TABS tablet Take 1 tablet (5 mg total) by mouth 2 (two) times daily. 10/20/22   Arnoldo Lenis, MD  Biotin 5000 MCG CAPS Take 1 capsule by mouth daily.    [provider]  budesonide-formoterol (SYMBICORT) 160-4.5 MCG/ACT inhaler Inhale 2 puffs into the lungs in the morning and at bedtime. Patient not taking: Reported on 11/23/2022 10/26/21   Hunsucker, Bonna Gains, MD  Calcium Citrate-Vitamin D 500-12.5 MG-MCG PACK Take by oral route.    [provider]  Cholecalciferol (VITAMIN D3) 10000 units capsule Take 10,000 Units by mouth daily.    [provider]  ferrous sulfate 325 (65 FE) MG tablet Take  by mouth. 04/22/22   [provider]  furosemide (LASIX) 20 MG tablet Take 2 tablets (40 mg total) by mouth daily. (May take an additional '20mg'$  as needed for weight gain greater than 270lb.) 08/25/22   Arnoldo Lenis, MD  Glucosamine-Chondroitin (GLUCOSAMINE CHONDR COMPLEX PO) Take 1 capsule by mouth daily.  Patient not taking: Reported on 11/23/2022    [provider]  guaiFENesin (MUCINEX) 600 MG 12 hr tablet Take 600 mg by mouth 2 (two) times daily as needed for cough or to loosen phlegm.    [provider]  irbesartan (AVAPRO) 150 MG tablet Take 150 mg by mouth daily.    [provider]  Magnesium 400 MG TABS Take 1 tablet by mouth 2 (two) times daily. For 4 days, then reduce to 400 mg daily 11/08/22   Arnoldo Lenis, MD   metoprolol tartrate (LOPRESSOR) 50 MG tablet TAKE 1 TABLET BY MOUTH TWICE A DAY 08/08/22   Arnoldo Lenis, MD  Omega-3 Fatty Acids (OMEGA-3 FISH OIL PO) Take 1 capsule by mouth daily. Patient not taking: Reported on 11/23/2022    [provider]  omeprazole (PRILOSEC) 40 MG capsule Take 40 mg by mouth every morning. 03/09/22   [provider]  potassium chloride SA (KLOR-CON M) 20 MEQ tablet Take 2 tablets (40 mEq total) by mouth daily. 11/08/22   Arnoldo Lenis, MD  triamcinolone lotion (KENALOG) 0.1 % Apply 1 application  topically 3 (three) times daily.    [provider]  vitamin B-12 (CYANOCOBALAMIN) 1000 MCG tablet Take 1,000 mcg by mouth daily.    [provider]  vitamin E 1000 UNIT capsule Take 1,000 Units by mouth daily.    [provider]      Allergies    Sulfa antibiotics and Adhesive [tape]    Review of Systems   Review of Systems  Unable to perform ROS: Mental status change    Physical Exam Updated Vital Signs BP (!) 176/113 (BP Location: Right Wrist)   Pulse (!) 106   Temp 98.1 F (36.7 C) (Oral)   Resp 19   Ht 1.727 m ('5\' 8"'$ )   Wt 131.1 kg   LMP 02/07/2012   SpO2 95%   BMI 43.94 kg/m  Physical Exam CONSTITUTIONAL: Elderly, ill-appearing HEAD: Bruising noted to the forehead and face, no step-offs or deformity EYES: PERRL ENMT: Mucous membranes moist NECK: supple no meningeal signs SPINE/BACK: No bruising/crepitance/stepoffs noted to spine CV: Irregular LUNGS: Lungs are clear to auscultation bilaterally, no apparent distress ABDOMEN: soft, nontender, obese, no bruising NEURO: Pt is awake but actively vomiting during initial part of the exam.  Patient then stares off and will intermittently answer questions.  Patient appears confused.  No gross motor deficits.  No facial droop EXTREMITIES: pulses normal/equal, full ROM, chronic lymphedema noted Significant bruising noted to both knees.  No obvious pelvic  deformities. All other extremities/joints palpated/ranged and nontender SKIN: warm, color normal PSYCH: Unable to assess ED Results / Procedures / Treatments   Labs (all labs ordered are listed, but only abnormal results are displayed) Labs Reviewed  PROTIME-INR - Abnormal; Notable for the following components:      Result Value   Prothrombin Time 17.0 (*)    INR 1.4 (*)    All other components within normal limits  CBC - Abnormal; Notable for the following components:   Platelets 131 (*)    All other components within normal limits  COMPREHENSIVE METABOLIC PANEL - Abnormal; Notable  for the following components:   Glucose, Bld 130 (*)    BUN 25 (*)    Creatinine, Ser 1.30 (*)    Total Bilirubin 3.9 (*)    GFR, Estimated 41 (*)    All other components within normal limits  CBG MONITORING, ED - Abnormal; Notable for the following components:   Glucose-Capillary 130 (*)    All other components within normal limits  RESP PANEL BY RT-PCR (RSV, FLU A&B, COVID)  RVPGX2  ETHANOL  APTT  DIFFERENTIAL  RAPID URINE DRUG SCREEN, HOSP PERFORMED  URINALYSIS, ROUTINE W REFLEX MICROSCOPIC  TYPE AND SCREEN    EKG EKG Interpretation  Date/Time:  Sunday November 27 2022 23:29:20 EST Ventricular Rate:  96 PR Interval:    QRS Duration: 94 QT Interval:  371 QTC Calculation: 469 R Axis:   251 Text Interpretation: Atrial fibrillation Left anterior fascicular block Lateral infarct, old Probable anteroseptal infarct, old Confirmed by Ripley Fraise [phone]) on 11/27/2022 11:41:02 PM  Radiology DG Chest Portable 1 View  Result Date: 11/27/2022 CLINICAL DATA:  Weakness EXAM: PORTABLE CHEST 1 VIEW COMPARISON:  10/26/2021 FINDINGS: Cardiac shadow is enlarged but stable. Lungs are well aerated bilaterally. Mild central vascular congestion is seen. No focal infiltrate is noted. No bony abnormality is seen. IMPRESSION: Mild central vascular congestion. Electronically Signed   By: Inez Catalina M.D.   On:  11/27/2022 23:55    Procedures .Critical Care  Performed by: Ripley Fraise, MD Authorized by: Ripley Fraise, MD   Critical care provider statement:    Critical care start time:  11/27/2022 11:44 PM   Critical care end time:  11/27/2022 11:44 PM   Critical care time was exclusive of:  Separately billable procedures and treating other patients   Critical care was necessary to treat or prevent imminent or life-threatening deterioration of the following conditions:  CNS failure or compromise   Critical care was time spent personally by me on the following activities:  Examination of patient, development of treatment plan with patient or surrogate, pulse oximetry, ordering and review of radiographic studies, ordering and review of laboratory studies, ordering and performing treatments and interventions, re-evaluation of patient's condition, evaluation of patient's response to treatment and obtaining history from patient or surrogate   I assumed direction of critical care for this patient from another provider in my specialty: no     Care discussed with: admitting provider     {Document cardiac monitor, telemetry assessment procedure when appropriate:1}  Medications Ordered in ED Medications  ondansetron (ZOFRAN) injection 4 mg (has no administration in time range)  ondansetron (ZOFRAN) injection 4 mg (4 mg Intravenous Given 11/27/22 2328)    ED Course/ Medical Decision Making/ A&P Clinical Course as of 11/28/22 0003  Sun Nov 27, 2022  2346 Patient seen on arrival for altered mental status.  Patient became abruptly confused with nausea vomiting.  Stat CT head was ordered. [DW]  2348 Intracranial hemorrhages noted, stat consult to neurosurgery [DW]  2355 Discussed with Dr. Christella Noa with neurosurgery, he will review imaging [DW]    Clinical Course User Index [DW] Ripley Fraise, MD   {   Click here for ABCD2, HEART and other calculatorsREFRESH Note before signing :1}        Glasgow Coma  Scale Score: 12                  Medical Decision Making Amount and/or Complexity of Data Reviewed Labs: ordered. Radiology: ordered.  Risk Prescription drug management. Decision regarding  hospitalization.   This patient presents to the ED for concern of altered mental status, this involves an extensive number of treatment options, and is a complaint that carries with it a high risk of complications and morbidity.  The differential diagnosis includes but is not limited to CVA, intracranial hemorrhage, acute coronary syndrome, renal failure, urinary tract infection, electrolyte disturbance, pneumonia    Comorbidities that complicate the patient evaluation: Patient's presentation is complicated by their history of atrial fibrillation, obesity  Social Determinants of Health: Patient's  poor mobility   increases the complexity of managing their presentation  Additional history obtained: Additional history obtained from family Records reviewed  cardiology notes reviewed  Lab Tests: I Ordered, and personally interpreted labs.  The pertinent results include: Renal insufficiency  Imaging Studies ordered: I ordered imaging studies including CT scan head/face/cspine and X-ray chest   I independently visualized and interpreted imaging which showed subdural hematoma I agree with the radiologist interpretation  Cardiac Monitoring: The patient was maintained on a cardiac monitor.  I personally viewed and interpreted the cardiac monitor which showed an underlying rhythm of:  Atrial Fibrillation  Medicines ordered and prescription drug management: I ordered medication including ***  for ***  Reevaluation of the patient after these medicines showed that the patient    {resolved/improved/worsened:23923::"improved"}  Test Considered: Patient is low risk / negative by ***, therefore do not feel that *** is indicated.  Critical Interventions:  ***  Consultations Obtained: I requested  consultation with the {consultation:26851}, and discussed  findings as well as pertinent plan - they recommend: ***  Reevaluation: After the interventions noted above, I reevaluated the patient and found that they have :stayed the same  Complexity of problems addressed: Patient's presentation is most consistent with  acute presentation with potential threat to life or bodily function  Disposition: After consideration of the diagnostic results and the patient's response to treatment,  I feel that the patent would benefit from admission ***.     {Document critical care time when appropriate:1} {Document review of labs and clinical decision tools ie heart score, Chads2Vasc2 etc:1}  {Document your independent review of radiology images, and any outside records:1} {Document your discussion with family members, caretakers, and with consultants:1} {Document social determinants of health affecting pt's care:1} {Document your decision making why or why not admission, treatments were needed:1} Final Clinical Impression(s) / ED Diagnoses Final diagnoses:  None    Rx / DC Orders ED Discharge Orders     None

## 2022-11-27 NOTE — ED Triage Notes (Signed)
Pt BIB daughter POV from home. Pt had fall 1 week ago hitting her head, had CTs completed that were negative. Pt had another fall Friday but hasn't been seen since then. Pt currently taking Eliquis.   Tonight pt starting having generalized weakness and confusion.

## 2022-11-28 ENCOUNTER — Emergency Department (HOSPITAL_COMMUNITY): Payer: Medicare Other

## 2022-11-28 DIAGNOSIS — K219 Gastro-esophageal reflux disease without esophagitis: Secondary | ICD-10-CM | POA: Diagnosis present

## 2022-11-28 DIAGNOSIS — S069X1S Unspecified intracranial injury with loss of consciousness of 30 minutes or less, sequela: Secondary | ICD-10-CM | POA: Diagnosis not present

## 2022-11-28 DIAGNOSIS — G4733 Obstructive sleep apnea (adult) (pediatric): Secondary | ICD-10-CM | POA: Diagnosis present

## 2022-11-28 DIAGNOSIS — I609 Nontraumatic subarachnoid hemorrhage, unspecified: Secondary | ICD-10-CM | POA: Diagnosis not present

## 2022-11-28 DIAGNOSIS — E1122 Type 2 diabetes mellitus with diabetic chronic kidney disease: Secondary | ICD-10-CM | POA: Diagnosis present

## 2022-11-28 DIAGNOSIS — I130 Hypertensive heart and chronic kidney disease with heart failure and stage 1 through stage 4 chronic kidney disease, or unspecified chronic kidney disease: Secondary | ICD-10-CM | POA: Diagnosis present

## 2022-11-28 DIAGNOSIS — S065XAA Traumatic subdural hemorrhage with loss of consciousness status unknown, initial encounter: Secondary | ICD-10-CM | POA: Diagnosis present

## 2022-11-28 DIAGNOSIS — Z8249 Family history of ischemic heart disease and other diseases of the circulatory system: Secondary | ICD-10-CM | POA: Diagnosis not present

## 2022-11-28 DIAGNOSIS — I5023 Acute on chronic systolic (congestive) heart failure: Secondary | ICD-10-CM | POA: Diagnosis not present

## 2022-11-28 DIAGNOSIS — I2729 Other secondary pulmonary hypertension: Secondary | ICD-10-CM | POA: Diagnosis present

## 2022-11-28 DIAGNOSIS — Z8616 Personal history of COVID-19: Secondary | ICD-10-CM | POA: Diagnosis not present

## 2022-11-28 DIAGNOSIS — Z7984 Long term (current) use of oral hypoglycemic drugs: Secondary | ICD-10-CM | POA: Diagnosis not present

## 2022-11-28 DIAGNOSIS — W19XXXD Unspecified fall, subsequent encounter: Secondary | ICD-10-CM | POA: Diagnosis present

## 2022-11-28 DIAGNOSIS — L03116 Cellulitis of left lower limb: Secondary | ICD-10-CM | POA: Diagnosis present

## 2022-11-28 DIAGNOSIS — I4821 Permanent atrial fibrillation: Secondary | ICD-10-CM | POA: Diagnosis present

## 2022-11-28 DIAGNOSIS — Z807 Family history of other malignant neoplasms of lymphoid, hematopoietic and related tissues: Secondary | ICD-10-CM | POA: Diagnosis not present

## 2022-11-28 DIAGNOSIS — R600 Localized edema: Secondary | ICD-10-CM | POA: Diagnosis not present

## 2022-11-28 DIAGNOSIS — D6832 Hemorrhagic disorder due to extrinsic circulating anticoagulants: Secondary | ICD-10-CM | POA: Diagnosis present

## 2022-11-28 DIAGNOSIS — I10 Essential (primary) hypertension: Secondary | ICD-10-CM | POA: Diagnosis not present

## 2022-11-28 DIAGNOSIS — Z1152 Encounter for screening for COVID-19: Secondary | ICD-10-CM | POA: Diagnosis not present

## 2022-11-28 DIAGNOSIS — I071 Rheumatic tricuspid insufficiency: Secondary | ICD-10-CM | POA: Diagnosis present

## 2022-11-28 DIAGNOSIS — F05 Delirium due to known physiological condition: Secondary | ICD-10-CM | POA: Diagnosis present

## 2022-11-28 DIAGNOSIS — N390 Urinary tract infection, site not specified: Secondary | ICD-10-CM | POA: Diagnosis not present

## 2022-11-28 DIAGNOSIS — Z825 Family history of asthma and other chronic lower respiratory diseases: Secondary | ICD-10-CM | POA: Diagnosis not present

## 2022-11-28 DIAGNOSIS — Z7901 Long term (current) use of anticoagulants: Secondary | ICD-10-CM | POA: Diagnosis not present

## 2022-11-28 DIAGNOSIS — I5082 Biventricular heart failure: Secondary | ICD-10-CM | POA: Diagnosis present

## 2022-11-28 DIAGNOSIS — I872 Venous insufficiency (chronic) (peripheral): Secondary | ICD-10-CM | POA: Diagnosis present

## 2022-11-28 DIAGNOSIS — Y92019 Unspecified place in single-family (private) house as the place of occurrence of the external cause: Secondary | ICD-10-CM | POA: Diagnosis not present

## 2022-11-28 DIAGNOSIS — R569 Unspecified convulsions: Secondary | ICD-10-CM | POA: Diagnosis present

## 2022-11-28 DIAGNOSIS — I110 Hypertensive heart disease with heart failure: Secondary | ICD-10-CM | POA: Diagnosis present

## 2022-11-28 DIAGNOSIS — W19XXXA Unspecified fall, initial encounter: Secondary | ICD-10-CM | POA: Diagnosis present

## 2022-11-28 DIAGNOSIS — N1831 Chronic kidney disease, stage 3a: Secondary | ICD-10-CM | POA: Diagnosis present

## 2022-11-28 DIAGNOSIS — E611 Iron deficiency: Secondary | ICD-10-CM | POA: Diagnosis present

## 2022-11-28 DIAGNOSIS — S065XAD Traumatic subdural hemorrhage with loss of consciousness status unknown, subsequent encounter: Secondary | ICD-10-CM | POA: Diagnosis present

## 2022-11-28 DIAGNOSIS — N179 Acute kidney failure, unspecified: Secondary | ICD-10-CM | POA: Diagnosis present

## 2022-11-28 DIAGNOSIS — M1990 Unspecified osteoarthritis, unspecified site: Secondary | ICD-10-CM | POA: Diagnosis present

## 2022-11-28 DIAGNOSIS — I5043 Acute on chronic combined systolic (congestive) and diastolic (congestive) heart failure: Secondary | ICD-10-CM | POA: Diagnosis not present

## 2022-11-28 DIAGNOSIS — I272 Pulmonary hypertension, unspecified: Secondary | ICD-10-CM | POA: Diagnosis not present

## 2022-11-28 DIAGNOSIS — I619 Nontraumatic intracerebral hemorrhage, unspecified: Secondary | ICD-10-CM | POA: Diagnosis present

## 2022-11-28 DIAGNOSIS — I5033 Acute on chronic diastolic (congestive) heart failure: Secondary | ICD-10-CM | POA: Diagnosis present

## 2022-11-28 DIAGNOSIS — Z806 Family history of leukemia: Secondary | ICD-10-CM | POA: Diagnosis not present

## 2022-11-28 DIAGNOSIS — Z6841 Body Mass Index (BMI) 40.0 and over, adult: Secondary | ICD-10-CM | POA: Diagnosis not present

## 2022-11-28 DIAGNOSIS — I2781 Cor pulmonale (chronic): Secondary | ICD-10-CM | POA: Diagnosis present

## 2022-11-28 DIAGNOSIS — Z96651 Presence of right artificial knee joint: Secondary | ICD-10-CM | POA: Diagnosis present

## 2022-11-28 DIAGNOSIS — Z79899 Other long term (current) drug therapy: Secondary | ICD-10-CM | POA: Diagnosis not present

## 2022-11-28 DIAGNOSIS — S069XAS Unspecified intracranial injury with loss of consciousness status unknown, sequela: Secondary | ICD-10-CM | POA: Diagnosis not present

## 2022-11-28 DIAGNOSIS — I5031 Acute diastolic (congestive) heart failure: Secondary | ICD-10-CM | POA: Diagnosis not present

## 2022-11-28 DIAGNOSIS — F411 Generalized anxiety disorder: Secondary | ICD-10-CM | POA: Diagnosis not present

## 2022-11-28 DIAGNOSIS — S069X9S Unspecified intracranial injury with loss of consciousness of unspecified duration, sequela: Secondary | ICD-10-CM | POA: Diagnosis not present

## 2022-11-28 DIAGNOSIS — R296 Repeated falls: Secondary | ICD-10-CM | POA: Diagnosis present

## 2022-11-28 DIAGNOSIS — S066XAA Traumatic subarachnoid hemorrhage with loss of consciousness status unknown, initial encounter: Secondary | ICD-10-CM | POA: Diagnosis present

## 2022-11-28 LAB — URINALYSIS, ROUTINE W REFLEX MICROSCOPIC
Bilirubin Urine: NEGATIVE
Glucose, UA: NEGATIVE mg/dL
Hgb urine dipstick: NEGATIVE
Ketones, ur: NEGATIVE mg/dL
Nitrite: NEGATIVE
Protein, ur: 100 mg/dL — AB
Specific Gravity, Urine: 1.023 (ref 1.005–1.030)
pH: 5 (ref 5.0–8.0)

## 2022-11-28 LAB — RAPID URINE DRUG SCREEN, HOSP PERFORMED
Amphetamines: NOT DETECTED
Barbiturates: NOT DETECTED
Benzodiazepines: POSITIVE — AB
Cocaine: NOT DETECTED
Opiates: NOT DETECTED
Tetrahydrocannabinol: NOT DETECTED

## 2022-11-28 LAB — TYPE AND SCREEN
ABO/RH(D): B POS
ABO/RH(D): B POS
Antibody Screen: NEGATIVE
Antibody Screen: NEGATIVE

## 2022-11-28 LAB — BRAIN NATRIURETIC PEPTIDE: B Natriuretic Peptide: 310.4 pg/mL — ABNORMAL HIGH (ref 0.0–100.0)

## 2022-11-28 LAB — MRSA NEXT GEN BY PCR, NASAL: MRSA by PCR Next Gen: NOT DETECTED

## 2022-11-28 LAB — RESP PANEL BY RT-PCR (RSV, FLU A&B, COVID)  RVPGX2
Influenza A by PCR: NEGATIVE
Influenza B by PCR: NEGATIVE
Resp Syncytial Virus by PCR: NEGATIVE
SARS Coronavirus 2 by RT PCR: NEGATIVE

## 2022-11-28 MED ORDER — SODIUM CHLORIDE 0.9 % IV BOLUS
500.0000 mL | Freq: Once | INTRAVENOUS | Status: AC
  Administered 2022-11-28: 500 mL via INTRAVENOUS

## 2022-11-28 MED ORDER — MIDAZOLAM HCL 2 MG/2ML IJ SOLN
1.0000 mg | Freq: Once | INTRAMUSCULAR | Status: AC
  Administered 2022-11-28: 1 mg via INTRAVENOUS
  Filled 2022-11-28: qty 2

## 2022-11-28 MED ORDER — ORAL CARE MOUTH RINSE
15.0000 mL | OROMUCOSAL | Status: DC
  Administered 2022-11-28: 15 mL via OROMUCOSAL

## 2022-11-28 MED ORDER — FERROUS SULFATE 325 (65 FE) MG PO TABS
325.0000 mg | ORAL_TABLET | Freq: Two times a day (BID) | ORAL | Status: DC
  Administered 2022-11-29 – 2022-12-01 (×5): 325 mg via ORAL
  Filled 2022-11-28 (×5): qty 1

## 2022-11-28 MED ORDER — METOPROLOL TARTRATE 50 MG PO TABS
50.0000 mg | ORAL_TABLET | Freq: Two times a day (BID) | ORAL | Status: DC
  Administered 2022-11-28 – 2022-12-01 (×6): 50 mg via ORAL
  Filled 2022-11-28 (×6): qty 1

## 2022-11-28 MED ORDER — EMPTY CONTAINERS FLEXIBLE MISC
900.0000 mg | Freq: Once | Status: AC
  Administered 2022-11-28: 900 mg via INTRAVENOUS
  Filled 2022-11-28: qty 60

## 2022-11-28 MED ORDER — PANTOPRAZOLE SODIUM 40 MG PO TBEC
80.0000 mg | DELAYED_RELEASE_TABLET | Freq: Every day | ORAL | Status: DC
  Administered 2022-11-28 – 2022-12-01 (×4): 80 mg via ORAL
  Filled 2022-11-28 (×4): qty 2

## 2022-11-28 MED ORDER — ALBUTEROL SULFATE (2.5 MG/3ML) 0.083% IN NEBU: 3.0000 mL | INHALATION_SOLUTION | Freq: Four times a day (QID) | RESPIRATORY_TRACT | Status: DC | PRN

## 2022-11-28 MED ORDER — METOPROLOL TARTRATE 25 MG PO TABS
25.0000 mg | ORAL_TABLET | Freq: Two times a day (BID) | ORAL | Status: AC
  Administered 2022-11-28: 25 mg via ORAL
  Filled 2022-11-28: qty 1

## 2022-11-28 MED ORDER — VITAMIN B-12 1000 MCG PO TABS
1000.0000 ug | ORAL_TABLET | Freq: Every day | ORAL | Status: DC
  Administered 2022-11-28 – 2022-12-01 (×4): 1000 ug via ORAL
  Filled 2022-11-28 (×4): qty 1

## 2022-11-28 MED ORDER — CHLORHEXIDINE GLUCONATE CLOTH 2 % EX PADS
6.0000 | MEDICATED_PAD | Freq: Every day | CUTANEOUS | Status: DC
  Administered 2022-11-28 – 2022-12-01 (×4): 6 via TOPICAL

## 2022-11-28 MED ORDER — POTASSIUM CHLORIDE CRYS ER 20 MEQ PO TBCR
40.0000 meq | EXTENDED_RELEASE_TABLET | Freq: Every day | ORAL | Status: DC
  Administered 2022-11-28 – 2022-12-01 (×4): 40 meq via ORAL
  Filled 2022-11-28 (×4): qty 2

## 2022-11-28 MED ORDER — ORAL CARE MOUTH RINSE: 15.0000 mL | OROMUCOSAL | Status: DC | PRN

## 2022-11-28 MED ORDER — FUROSEMIDE 40 MG PO TABS: 40.0000 mg | ORAL_TABLET | Freq: Every day | ORAL | Status: DC

## 2022-11-28 MED ORDER — SODIUM CHLORIDE 0.9 % IV SOLN: INTRAVENOUS | Status: DC

## 2022-11-28 MED ORDER — LEVETIRACETAM IN NACL 1000 MG/100ML IV SOLN
1000.0000 mg | INTRAVENOUS | Status: AC
  Administered 2022-11-28 (×2): 1000 mg via INTRAVENOUS
  Filled 2022-11-28 (×2): qty 100

## 2022-11-28 MED ORDER — BIOTIN 5000 MCG PO CAPS: 1.0000 | ORAL_CAPSULE | Freq: Every day | ORAL | Status: DC

## 2022-11-28 MED ORDER — CLEVIDIPINE BUTYRATE 0.5 MG/ML IV EMUL: 0.0000 mg/h | INTRAVENOUS | Status: DC

## 2022-11-28 MED ORDER — MAGNESIUM OXIDE -MG SUPPLEMENT 400 (240 MG) MG PO TABS
400.0000 mg | ORAL_TABLET | Freq: Two times a day (BID) | ORAL | Status: DC
  Administered 2022-11-28 – 2022-12-01 (×6): 400 mg via ORAL
  Filled 2022-11-28 (×6): qty 1

## 2022-11-28 MED ORDER — IRBESARTAN 150 MG PO TABS
150.0000 mg | ORAL_TABLET | Freq: Every day | ORAL | Status: DC
  Administered 2022-11-28 – 2022-12-01 (×4): 150 mg via ORAL
  Filled 2022-11-28 (×4): qty 1

## 2022-11-28 NOTE — Consult Note (Addendum)
Cardiology Consultation   Patient ID: Airen Goldrick MRN: [id number]; DOB: Jun 23, 1941  Admit date: 11/27/2022 Date of Consult: 11/28/2022  PCP:  Cathie Olden, MD   [place] Providers Cardiologist:  Carlyle Dolly, MD  Cardiology APP:  Finis Bud, NP       Patient Profile:   [name] is a 82 y.o. female with a hx of chronic diastolic CHF, hypertension, persistent atrial fibrillation, chronic venous insufficiency, OSA who is being seen 11/28/2022 for the evaluation of leg swelling, heart failure at the request of Dr. Christella Noa.  History of Present Illness:   Ms. Shackett presented to the [facility] ED on 3/3 after she developed acute confusion and flashing lights in visit. These symptoms followed a fall about 72 hours ago when hit the back of her head. Apparently patient has been unstable/struggling with ambulatory stability for several weeks. Imaging workup notable for subdural hematoma, traumatic subarachnoid hemorrhage. Cardiology asked to consult on patient due to lower extremity edema.  Today patient has limited memory of recent history after her falls. She feels like dyspnea on exertion and lower extremity edema have gotten slightly worse in recent weeks. Interestingly she was recently seen by Finis Bud on 2/28 and at that visit, it was noted that patient had improved leg edema and generally appeared euvolemic. Denies chest pain or shortness of breath at rest today. Endorses intermittent sensation of palpitations.   Past Medical History:  Diagnosis Date   Anemia    Anxiety    Arthritis    Back pain    reason unknown   Cataract    bilateral immature   Diarrhea    Diverticulosis    GERD (gastroesophageal reflux disease)    takes Omeprazole daily   Headache(784.0)    occasionally   Hiatal hernia    History of blood transfusion    no abnormal reaction noted   History of bronchitis 2014   History of colon polyps    History of shingles     Hypertension    takes Metoprolol,Amlodipine and Losartan daily   Internal hemorrhoids    Joint pain    Joint swelling    Lung nodule    Nocturia    Numbness    hands occasionally   Osteoarthritis    Peripheral edema    takes HCTZ daily and Furosemide daily as needed   PONV (postoperative nausea and vomiting)    Redness    to both lower extremities   Shortness of breath    WITH EXERTION    Sleep apnea    doesn't use cpap   Urinary frequency    Urinary urgency    Venous insufficiency     Past Surgical History:  Procedure Laterality Date   ABDOMINAL HYSTERECTOMY  1983   ANKLE SURGERY Left 1993   ANTERIOR CRUCIATE LIGAMENT REPAIR Left 2013   BREAST SURGERY     LEFT SIDE BIOPSY 1989   CHOLECYSTECTOMY  1990   COLONOSCOPY     ENDOVENOUS ABLATION SAPHENOUS VEIN W/ LASER Right 04/17/2017   endovenous laser ablation R GSV by Tinnie Gens MD   ESOPHAGOGASTRODUODENOSCOPY     HAND SURGERY Left 2011   [place]  07/16/2012   Procedure: REPAIR QUADRICEP TENDON;  Surgeon: Rudean Haskell, MD;  Location: [place];  Service: Orthopedics;  Laterality: Left;  open repair of VMO    quiadricp  10'21/2012   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   Procedure: TOTAL KNEE ARTHROPLASTY;  Surgeon: Estill Bamberg  Ronnie Derby, MD;  Location: [place];  Service: Orthopedics;  Laterality: Left;   TOTAL KNEE ARTHROPLASTY Right 11/04/2013   DR Ronnie Derby   TOTAL KNEE ARTHROPLASTY Right 11/04/2013   Procedure: RIGHT TOTAL KNEE ARTHROPLASTY;  Surgeon: Vickey Huger, MD;  Location: [place];  Service: Orthopedics;  Laterality: Right;   TUBAL LIGATION  1974   vasculitic eruption of legs  1985     Home Medications:  Prior to Admission medications   Medication Sig Start Date End Date Taking? Authorizing Provider  acetaminophen (TYLENOL) 500 MG tablet Take 500 mg by mouth every 6 (six) hours as needed for mild pain. For pain   Yes [provider]  albuterol (VENTOLIN HFA) 108 (90 Base) MCG/ACT inhaler Inhale 2  puffs into the lungs every 6 (six) hours as needed for wheezing or shortness of breath. 10/26/21  Yes Hunsucker, Bonna Gains, MD  apixaban (ELIQUIS) 5 MG TABS tablet Take 1 tablet (5 mg total) by mouth 2 (two) times daily. 10/20/22  Yes BranchAlphonse Guild, MD  Biotin 5000 MCG CAPS Take 1 capsule by mouth daily.   Yes [provider]  Calcium Citrate-Vitamin D 500-12.5 MG-MCG PACK Take by oral route.   Yes [provider]  ferrous sulfate 325 (65 FE) MG tablet Take 325 mg by mouth 2 (two) times daily with a meal. 04/22/22  Yes [provider]  furosemide (LASIX) 20 MG tablet Take 2 tablets (40 mg total) by mouth daily. (May take an additional '20mg'$  as needed for weight gain greater than 270lb.) Patient taking differently: Take 20 mg by mouth in the morning, at noon, and at bedtime. (May take an additional '20mg'$  as needed for weight gain greater than 270lb.) 08/25/22  Yes Branch, Alphonse Guild, MD  Glucosamine-Chondroitin (GLUCOSAMINE CHONDR COMPLEX PO) Take 1 capsule by mouth daily.   Yes [provider]  irbesartan (AVAPRO) 150 MG tablet Take 150 mg by mouth daily.   Yes [provider]  Magnesium 400 MG TABS Take 1 tablet by mouth 2 (two) times daily. For 4 days, then reduce to 400 mg daily 11/08/22  Yes Branch, Alphonse Guild, MD  Omega-3 Fatty Acids (OMEGA-3 FISH OIL PO) Take 1 capsule by mouth daily.   Yes [provider]  omeprazole (PRILOSEC) 40 MG capsule Take 40 mg by mouth every morning. 03/09/22  Yes [provider]  potassium chloride SA (KLOR-CON M) 20 MEQ tablet Take 2 tablets (40 mEq total) by mouth daily. 11/08/22  Yes BranchAlphonse Guild, MD  triamcinolone lotion (KENALOG) 0.1 % Apply 1 application  topically 3 (three) times daily. For legs   Yes [provider]  vitamin B-12 (CYANOCOBALAMIN) 1000 MCG tablet Take 1,000 mcg by mouth daily.   Yes [provider]  vitamin E 180 MG (400 UNITS) capsule Take 400 Units by mouth  daily.   Yes [provider]  metoprolol tartrate (LOPRESSOR) 50 MG tablet TAKE 1 TABLET BY MOUTH TWICE A DAY 11/28/22   Arnoldo Lenis, MD    Inpatient Medications: Scheduled Meds:  Chlorhexidine Gluconate Cloth  6 each Topical Daily   mouth rinse  15 mL Mouth Rinse 4 times per day   Continuous Infusions:  clevidipine Stopped (11/28/22 0220)   PRN Meds: mouth rinse  Allergies:    Allergies  Allergen Reactions   Sulfa Antibiotics     Reaction unknown   Adhesive [Tape] Other (See Comments) and Rash    Skin is very sensitive Skin is very sensitive Skin  is very sensitive    Social History:   Social History   Socioeconomic History   Marital status: Single    Spouse name: Not on file   Number of children: 3   Years of education: 14   Highest education level: Not on file  Occupational History   Occupation: retired  Tobacco Use   Smoking status: Never   Smokeless tobacco: Never  Vaping Use   Vaping Use: Never used  Substance and Sexual Activity   Alcohol use: No    Alcohol/week: 0.0 standard drinks of alcohol   Drug use: No   Sexual activity: Never    Birth control/protection: Surgical  Other Topics Concern   Not on file  Social History Narrative   Fun: Exercise class twice per week.    Denies abuse and feels safe at home.    Social Determinants of Health   Financial Resource Strain: Not on file  Food Insecurity: Not on file  Transportation Needs: Not on file  Physical Activity: Not on file  Stress: Not on file  Social Connections: Not on file  Intimate Partner Violence: Not on file    Family History:    Family History  Problem Relation Age of Onset   Lymphoma Father    Leukemia Father    Heart attack Father    Hypertension Mother    Emphysema Mother    Heart disease Sister    Rashes / Skin problems Sister    Heart failure Sister    Kidney disease Sister    Heart disease Maternal Grandfather    Heart disease Paternal Grandmother     Heart disease Paternal Grandfather      ROS:  Please see the history of present illness.   All other ROS reviewed and negative.     Physical Exam/Data:   Vitals:   11/28/22 0900 11/28/22 1000 11/28/22 1100 11/28/22 1200  BP: (!) 116/58 (!) 124/50 112/83 125/62  Pulse: 88 83 79 95  Resp: '17 18 18 '$ (!) 22  Temp:      TempSrc:      SpO2: 99% 100% 96% 98%  Weight:      Height:        Intake/Output Summary (Last 24 hours) at 11/28/2022 1356 Last data filed at 11/28/2022 0600 Gross per 24 hour  Intake 781.25 ml  Output --  Net 781.25 ml      11/27/2022   11:26 PM 11/23/2022    1:27 PM 08/25/2022    3:17 PM  Last 3 Weights  Weight (lbs) 289 lb 289 lb 275 lb 12.8 oz  Weight (kg) 131.09 kg 131.09 kg 125.102 kg     Body mass index is 43.94 kg/m.  General:  Well nourished, well developed, in no acute distress HEENT: facial bruising noted Neck: no JVD Vascular: No carotid bruits; Distal pulses 2+ bilaterally Cardiac:  normal S1, S2; RRR; no murmur  Lungs:  clear to auscultation bilaterally, no wheezing, rhonchi or rales  Abd: soft, nontender, no hepatomegaly  Ext: notable lymphedema of bilateral lower extremities. Both are quite tender to touch, limited ability to assess pitting vs non-pitting due to discomfort. Musculoskeletal:  No deformities, BUE and BLE strength normal and equal Skin: warm and dry  Neuro:  CNs 2-12 intact, no focal abnormalities noted Psych:  Normal affect   EKG:  The EKG was personally reviewed and demonstrates:  atrial fibrillation with LAFB pattern in precordial leads Telemetry:  Telemetry was personally reviewed and demonstrates:  atrial fibrillation,  rate controlled  Relevant CV Studies:  11/08/21 TTE  IMPRESSIONS     1. Left ventricular ejection fraction, by estimation, is 60 to 65%. The  left ventricle has normal function. The left ventricle has no regional  wall motion abnormalities. There is moderate left ventricular hypertrophy.  Left  ventricular diastolic  parameters are indeterminate.   2. Right ventricular systolic function is normal. The right ventricular  size is mildly enlarged. There is mildly elevated pulmonary artery  systolic pressure. The estimated right ventricular systolic pressure is  Q000111Q mmHg.   3. Left atrial size was severely dilated.   4. Right atrial size was moderately dilated.   5. The pericardial effusion is circumferential.   6. The mitral valve is normal in structure. Trivial mitral valve  regurgitation. No evidence of mitral stenosis.   7. Tricuspid valve regurgitation is severe.   8. The aortic valve is normal in structure. Aortic valve regurgitation is  not visualized. No aortic stenosis is present.   9. The inferior vena cava is dilated in size with <50% respiratory  variability, suggesting right atrial pressure of 15 mmHg.   FINDINGS   Left Ventricle: Left ventricular ejection fraction, by estimation, is 60  to 65%. The left ventricle has normal function. The left ventricle has no  regional wall motion abnormalities. The left ventricular internal cavity  size was normal in size. There is   moderate left ventricular hypertrophy. Left ventricular diastolic  parameters are indeterminate.   Right Ventricle: The right ventricular size is mildly enlarged. No  increase in right ventricular wall thickness. Right ventricular systolic  function is normal. There is mildly elevated pulmonary artery systolic  pressure. The tricuspid regurgitant  velocity is 2.53 m/s, and with an assumed right atrial pressure of 15  mmHg, the estimated right ventricular systolic pressure is Q000111Q mmHg.   Left Atrium: Left atrial size was severely dilated.   Right Atrium: Right atrial size was moderately dilated.   Pericardium: Trivial pericardial effusion is present. The pericardial  effusion is circumferential.   Mitral Valve: The mitral valve is normal in structure. Trivial mitral  valve regurgitation. No  evidence of mitral valve stenosis.   Tricuspid Valve: The tricuspid valve is normal in structure. Tricuspid  valve regurgitation is severe. No evidence of tricuspid stenosis.   Aortic Valve: The aortic valve is normal in structure. Aortic valve  regurgitation is not visualized. No aortic stenosis is present.   Pulmonic Valve: The pulmonic valve was normal in structure. Pulmonic valve  regurgitation is not visualized. No evidence of pulmonic stenosis.   Aorta: The aortic root is normal in size and structure.   Venous: The inferior vena cava is dilated in size with less than 50%  respiratory variability, suggesting right atrial pressure of 15 mmHg.   IAS/Shunts: No atrial level shunt detected by color flow Doppler.   Laboratory Data:  High Sensitivity Troponin:  No results for input(s): "TROPONINIHS" in the last 720 hours.   Chemistry Recent Labs  Lab 11/23/22 1547 11/27/22 2322  NA 138 137  K 4.6 4.1  CL 99 102  CO2 29 25  GLUCOSE 130* 130*  BUN 25* 25*  CREATININE 1.28* 1.30*  CALCIUM 9.7 9.5  MG 2.0  --   GFRNONAA 42* 41*  ANIONGAP 10 10    Recent Labs  Lab 11/27/22 2322  PROT 7.5  ALBUMIN 3.9  AST 24  ALT 15  ALKPHOS 81  BILITOT 3.9*   Lipids No results for input(s): "  CHOL", "TRIG", "HDL", "LABVLDL", "LDLCALC", "CHOLHDL" in the last 168 hours.  Hematology Recent Labs  Lab 11/27/22 2322  WBC 7.0  RBC 4.23  HGB 13.3  HCT 41.7  MCV 98.6  MCH 31.4  MCHC 31.9  RDW 15.5  PLT 131*   Thyroid No results for input(s): "TSH", "FREET4" in the last 168 hours.  BNPNo results for input(s): "BNP", "PROBNP" in the last 168 hours.  DDimer No results for input(s): "DDIMER" in the last 168 hours.   Radiology/Studies:  CT Head Wo Contrast  Result Date: 11/28/2022 CLINICAL DATA:  Multiple recent falls.  Change in mental status. EXAM: CT HEAD WITHOUT CONTRAST TECHNIQUE: Contiguous axial images were obtained from the base of the skull through the vertex without  intravenous contrast. RADIATION DOSE REDUCTION: This exam was performed according to the departmental dose-optimization program which includes automated exposure control, adjustment of the mA and/or kV according to patient size and/or use of iterative reconstruction technique. COMPARISON:  11/27/2022 FINDINGS: Brain: Again seen is subarachnoid hemorrhage in the left posterior parietooccipital region. Blood along the falx and tentorium also again noted. Left frontal and temporal subdural hematoma again noted, isodense currently, measuring up to 5 mm, stable in thickness. Findings are stable since prior study. No intraparenchymal hemorrhage. No mass effect or midline shift. No hydrocephalus. Vascular: No hyperdense vessel or unexpected calcification. Skull: No acute calvarial abnormality. Sinuses/Orbits: No acute findings Other: None IMPRESSION: Stable subdural and subarachnoid hemorrhage as described above. Electronically Signed   By: Rolm Baptise M.D.   On: 11/28/2022 03:08   DG Knee Left Port  Result Date: 11/28/2022 CLINICAL DATA:  Left knee pain following fall, initial encounter EXAM: PORTABLE LEFT KNEE - 1-2 VIEW COMPARISON:  None Available. 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 Patella appears somewhat laterally displaced and may be dislocated. Correlate with the clinical exam. No fracture is identified. No joint effusion is seen. IMPRESSION: Patella appears laterally displaced.  Correlate with physical exam. No other focal abnormality is noted. Electronically Signed   By: Inez Catalina M.D.   On: 11/28/2022 00:33   DG Knee Right Port  Result Date: 11/28/2022 CLINICAL DATA:  Knee pain following fall, initial encounter EXAM: PORTABLE RIGHT KNEE - 4 VIEW COMPARISON:  None Available. FINDINGS: Right knee prosthesis is noted. No findings to suggest loosening are seen. No acute fracture or dislocation is noted. No joint effusion is seen. IMPRESSION: No acute abnormality noted. Electronically Signed   By: Inez Catalina M.D.   On: 11/28/2022 00:32   DG Pelvis Portable  Result Date: 11/28/2022 CLINICAL DATA:  Pelvic pain following fall, initial encounter EXAM: PORTABLE PELVIS 1-2 VIEWS COMPARISON:  None Available. FINDINGS: Pelvic ring is intact. No acute fracture or dislocation is noted. No soft tissue changes are seen. IMPRESSION: No acute abnormality noted. Electronically Signed   By: Inez Catalina M.D.   On: 11/28/2022 00:31   CT HEAD WO CONTRAST  Result Date: 11/28/2022 CLINICAL DATA:  Fall, head, facial, and neck trauma. EXAM: CT HEAD WITHOUT CONTRAST CT MAXILLOFACIAL WITHOUT CONTRAST CT CERVICAL SPINE WITHOUT CONTRAST TECHNIQUE: Multidetector CT imaging of the head, cervical spine, and maxillofacial structures were performed using the standard protocol without intravenous contrast. Multiplanar CT image reconstructions of the cervical spine and maxillofacial structures were also generated. RADIATION DOSE REDUCTION: This exam was performed according to the departmental dose-optimization program which includes automated exposure control, adjustment of the mA and/or kV according to patient size and/or use of iterative reconstruction technique. COMPARISON:  09/29/2017.  FINDINGS: CT HEAD FINDINGS Brain: A subarachnoid hemorrhage is noted overlying the parieto-occipital region on the left. There is a small subdural hematoma along the falx extending along the tentorium cerebelli bilaterally. A small subdural hematoma is noted over the frontal and temporal lobes on the left measuring up to 5 mm. There is mild mass effect on the left cerebral hemisphere with midline shift to the right of approximately 1-2 mm at the level of the septum pellucidum. Gray-white matter differentiation is otherwise within normal limits. No hydrocephalus. Vascular: No hyperdense vessel or unexpected calcification. Skull: Normal. Negative for fracture or focal lesion. Other: Increased density in the scalp over the frontal lobe in the midline,  possible contusion given history of trauma. CT MAXILLOFACIAL FINDINGS Osseous: No fracture or mandibular dislocation. No destructive process. Orbits: Negative. No traumatic or inflammatory finding. Sinuses: Mild mucosal thickening in the left maxillary sinus. No air-fluid levels. Soft tissues: No large hematoma is seen. Radiopaque densities are noted along the skin surface in the temporal region on the right. CT CERVICAL SPINE FINDINGS Alignment: There is mild anterolisthesis at C4-C5 and C5-C6. Skull base and vertebrae: No acute fracture. No primary bone lesion or focal pathologic process. Soft tissues and spinal canal: No prevertebral fluid or swelling. No visible canal hematoma. Disc levels: Multilevel intervertebral disc space narrowing, uncovertebral osteophyte formation, and facet arthropathy. Upper chest: No acute abnormality. Other: Carotid artery calcifications are noted. IMPRESSION: 1. Subarachnoid hemorrhage involving the parieto-occipital region on the left. 2. Subdural hematoma along the falx and tentorium cerebelli. Subdural hematoma over the frontal and temporal lobes on the left measuring up to 5 mm with mass effect on the left cerebral hemisphere and midline shift to the right of a proximally 1-2 mm. 3. No evidence of facial bone fracture. 4. Multilevel degenerative changes in the cervical spine without evidence of acute fracture. Critical Value/emergent results were called by telephone at the time of interpretation on 11/28/2022 at 12:07 am to provider Ripley Fraise , who verbally acknowledged these results. Electronically Signed   By: Brett Fairy M.D.   On: 11/28/2022 00:15   CT CERVICAL SPINE WO CONTRAST  Result Date: 11/28/2022 CLINICAL DATA:  Fall, head, facial, and neck trauma. EXAM: CT HEAD WITHOUT CONTRAST CT MAXILLOFACIAL WITHOUT CONTRAST CT CERVICAL SPINE WITHOUT CONTRAST TECHNIQUE: Multidetector CT imaging of the head, cervical spine, and maxillofacial structures were performed using  the standard protocol without intravenous contrast. Multiplanar CT image reconstructions of the cervical spine and maxillofacial structures were also generated. RADIATION DOSE REDUCTION: This exam was performed according to the departmental dose-optimization program which includes automated exposure control, adjustment of the mA and/or kV according to patient size and/or use of iterative reconstruction technique. COMPARISON:  09/29/2017. FINDINGS: CT HEAD FINDINGS Brain: A subarachnoid hemorrhage is noted overlying the parieto-occipital region on the left. There is a small subdural hematoma along the falx extending along the tentorium cerebelli bilaterally. A small subdural hematoma is noted over the frontal and temporal lobes on the left measuring up to 5 mm. There is mild mass effect on the left cerebral hemisphere with midline shift to the right of approximately 1-2 mm at the level of the septum pellucidum. Gray-white matter differentiation is otherwise within normal limits. No hydrocephalus. Vascular: No hyperdense vessel or unexpected calcification. Skull: Normal. Negative for fracture or focal lesion. Other: Increased density in the scalp over the frontal lobe in the midline, possible contusion given history of trauma. CT MAXILLOFACIAL FINDINGS Osseous: No fracture or mandibular dislocation. No destructive  process. Orbits: Negative. No traumatic or inflammatory finding. Sinuses: Mild mucosal thickening in the left maxillary sinus. No air-fluid levels. Soft tissues: No large hematoma is seen. Radiopaque densities are noted along the skin surface in the temporal region on the right. CT CERVICAL SPINE FINDINGS Alignment: There is mild anterolisthesis at C4-C5 and C5-C6. Skull base and vertebrae: No acute fracture. No primary bone lesion or focal pathologic process. Soft tissues and spinal canal: No prevertebral fluid or swelling. No visible canal hematoma. Disc levels: Multilevel intervertebral disc space  narrowing, uncovertebral osteophyte formation, and facet arthropathy. Upper chest: No acute abnormality. Other: Carotid artery calcifications are noted. IMPRESSION: 1. Subarachnoid hemorrhage involving the parieto-occipital region on the left. 2. Subdural hematoma along the falx and tentorium cerebelli. Subdural hematoma over the frontal and temporal lobes on the left measuring up to 5 mm with mass effect on the left cerebral hemisphere and midline shift to the right of a proximally 1-2 mm. 3. No evidence of facial bone fracture. 4. Multilevel degenerative changes in the cervical spine without evidence of acute fracture. Critical Value/emergent results were called by telephone at the time of interpretation on 11/28/2022 at 12:07 am to provider Ripley Fraise , who verbally acknowledged these results. Electronically Signed   By: Brett Fairy M.D.   On: 11/28/2022 00:15   CT Maxillofacial Wo Contrast  Result Date: 11/28/2022 CLINICAL DATA:  Fall, head, facial, and neck trauma. EXAM: CT HEAD WITHOUT CONTRAST CT MAXILLOFACIAL WITHOUT CONTRAST CT CERVICAL SPINE WITHOUT CONTRAST TECHNIQUE: Multidetector CT imaging of the head, cervical spine, and maxillofacial structures were performed using the standard protocol without intravenous contrast. Multiplanar CT image reconstructions of the cervical spine and maxillofacial structures were also generated. RADIATION DOSE REDUCTION: This exam was performed according to the departmental dose-optimization program which includes automated exposure control, adjustment of the mA and/or kV according to patient size and/or use of iterative reconstruction technique. COMPARISON:  09/29/2017. FINDINGS: CT HEAD FINDINGS Brain: A subarachnoid hemorrhage is noted overlying the parieto-occipital region on the left. There is a small subdural hematoma along the falx extending along the tentorium cerebelli bilaterally. A small subdural hematoma is noted over the frontal and temporal lobes on  the left measuring up to 5 mm. There is mild mass effect on the left cerebral hemisphere with midline shift to the right of approximately 1-2 mm at the level of the septum pellucidum. Gray-white matter differentiation is otherwise within normal limits. No hydrocephalus. Vascular: No hyperdense vessel or unexpected calcification. Skull: Normal. Negative for fracture or focal lesion. Other: Increased density in the scalp over the frontal lobe in the midline, possible contusion given history of trauma. CT MAXILLOFACIAL FINDINGS Osseous: No fracture or mandibular dislocation. No destructive process. Orbits: Negative. No traumatic or inflammatory finding. Sinuses: Mild mucosal thickening in the left maxillary sinus. No air-fluid levels. Soft tissues: No large hematoma is seen. Radiopaque densities are noted along the skin surface in the temporal region on the right. CT CERVICAL SPINE FINDINGS Alignment: There is mild anterolisthesis at C4-C5 and C5-C6. Skull base and vertebrae: No acute fracture. No primary bone lesion or focal pathologic process. Soft tissues and spinal canal: No prevertebral fluid or swelling. No visible canal hematoma. Disc levels: Multilevel intervertebral disc space narrowing, uncovertebral osteophyte formation, and facet arthropathy. Upper chest: No acute abnormality. Other: Carotid artery calcifications are noted. IMPRESSION: 1. Subarachnoid hemorrhage involving the parieto-occipital region on the left. 2. Subdural hematoma along the falx and tentorium cerebelli. Subdural hematoma over the frontal and temporal  lobes on the left measuring up to 5 mm with mass effect on the left cerebral hemisphere and midline shift to the right of a proximally 1-2 mm. 3. No evidence of facial bone fracture. 4. Multilevel degenerative changes in the cervical spine without evidence of acute fracture. Critical Value/emergent results were called by telephone at the time of interpretation on 11/28/2022 at 12:07 am to  provider Ripley Fraise , who verbally acknowledged these results. Electronically Signed   By: Brett Fairy M.D.   On: 11/28/2022 00:15   DG Chest Portable 1 View  Result Date: 11/27/2022 CLINICAL DATA:  Weakness EXAM: PORTABLE CHEST 1 VIEW COMPARISON:  10/26/2021 FINDINGS: Cardiac shadow is enlarged but stable. Lungs are well aerated bilaterally. Mild central vascular congestion is seen. No focal infiltrate is noted. No bony abnormality is seen. IMPRESSION: Mild central vascular congestion. Electronically Signed   By: Inez Catalina M.D.   On: 11/27/2022 23:55     Assessment and Plan:   Chronic HFpEF Tricuspid regurgitation  Patient admitted following multiple recent falls and now admitted with subdural hematoma, traumatic subarachnoid hemorrhage. TTE in February 2023 with LVEF 60-65%, tricuspid regurgitation. Overall appears to be well-compensated today without pulmonary edema or elevation of JVP. Suspect that lower extremity edema is primarily driven by combination of lymphedema and chronic venous insufficiency.    No indication for aggressive CHF management at this time.  Check BNP. If significantly elevated, would consider inpatient TTE. Otherwise, would be reasonable to defer this to outpatient setting.  Overall patient has had very limited oral intake in the previous ~72 hours. Given increasing creatinine and stable volume status, defer diuresis for now. Once more stable with baseline oral intake, would resume home lasix '20mg'$  BID.  Permanent atrial fibrillation  Patient now with long-standing afib. Minimal symptoms, infrequent palpitations.  Hold Eliquis until cleared by neurosurgery to resume Resume metoprolol tartrate for rate control  Hypertension  Patient on home regimen of Irbesartan '150mg'$ . Somewhat labile BP in the setting of acute illness/subarachnoid hemorrhage. Would plan to resume tomorrow if hemodynamically stable.   Risk Assessment/Risk Scores:        New York Heart  Association (NYHA) Functional Class NYHA Class II  CHA2DS2-VASc Score = 5   This indicates a 7.2% annual risk of stroke. The patient's score is based upon: CHF History: 1 HTN History: 1 Diabetes History: 0 Stroke History: 0 Vascular Disease History: 0 Age Score: 2 Gender Score: 1         For questions or updates, please contact [place] Please consult [email or web address] for contact info under    Signed, Lily Kocher, PA-C  11/28/2022 1:56 PM  Personally seen and examined. Agree with APP above with the following comments:  Briefly 82 yo F with morbid obesity, HFpEF, Permanent atrial fibrillation, and TR who presents with SAH History of AMS and confusion leading to [facility] is well described above.  In discussing with patient she notes DOE prior to fall but is a bit dizziness. She carries a history of chronic venous insufficiency  Exam notable for bilateral pain leg edema with chronic skin changes; no JVD CTAB She has a systolic murmur with atrial fibrillation and an irregular rate; heart rates < 110 bpm  Would recommend  - we will get a BNP; will get an echo - no DOAC until cleared by NSG - restarted BB - She has an AKI presently, I think she may have chronic venous statis but may be intravascularly dry; Ok for PO  intake - will get echo tomorrow  Rudean Haskell, MD [facility]  [address] [place], [place] [address] [phone]  4:16 PM

## 2022-11-28 NOTE — ED Notes (Signed)
Attempted to call report to ICU x 1. 

## 2022-11-28 NOTE — Evaluation (Signed)
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   History of COVID-19 07/22/2021   Chronic diastolic heart failure ([place]) 12/03/2018   AF (paroxysmal atrial fibrillation) ([place]) 12/03/2018   Hypomagnesemia 12/03/2018   Bilateral hand numbness 05/19/2017   Varicose veins of right lower extremity with complications [id number]   Edema 10/31/2016   Prophylactic antibiotic for dental procedure indicated due to prior joint replacement 09/29/2016   GERD (gastroesophageal reflux disease) 09/29/2016   Ventral hernia 09/29/2016   SOB (shortness of breath) 05/10/2016   Abdominal wall hernia 05/10/2016   Venous stasis dermatitis of both lower extremities 05/10/2016   Abnormal CT of the chest 12/22/2015   Multiple pulmonary nodules 12/22/2015   Morbid obesity ([place]) 11/14/2013   Presence of unspecified artificial knee joint 07/30/2013   Rosacea 04/03/2012   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/2012   Sleep apnea 02/24/2009   Anxiety 10/23/2008   Venous insufficiency 07/11/2002   Osteoarthritis 01/21/2002   PVD (peripheral vascular disease) ([place]) 11/27/2001   Past Medical History:  Past Medical History:  Diagnosis Date   Anemia    Anxiety    Arthritis    Back pain    reason unknown   Cataract    bilateral immature   Diarrhea    Diverticulosis    GERD (gastroesophageal reflux disease)    takes Omeprazole daily   Headache(784.0)    occasionally   Hiatal hernia    History of blood transfusion    no abnormal reaction noted   History of bronchitis  2014   History of colon polyps    History of shingles    Hypertension    takes Metoprolol,Amlodipine and Losartan daily   Internal hemorrhoids    Joint pain    Joint swelling    Lung nodule    Nocturia    Numbness    hands occasionally   Osteoarthritis    Peripheral edema    takes HCTZ daily and Furosemide daily as needed   PONV (postoperative nausea and vomiting)    Redness    to both lower extremities   Shortness of breath    WITH EXERTION    Sleep apnea    doesn't use cpap   Urinary frequency    Urinary urgency    Venous insufficiency    Past Surgical History:  Past Surgical History:  Procedure Laterality Date   ABDOMINAL HYSTERECTOMY  1983   ANKLE SURGERY Left 1993   ANTERIOR CRUCIATE LIGAMENT REPAIR Left 2013   BREAST SURGERY     LEFT SIDE BIOPSY 1989   CHOLECYSTECTOMY  1990   COLONOSCOPY     ENDOVENOUS ABLATION SAPHENOUS VEIN W/ LASER Right 04/17/2017   endovenous laser ablation R GSV by Tinnie Gens MD   ESOPHAGOGASTRODUODENOSCOPY     HAND SURGERY Left 2011   [place]  07/16/2012   Procedure: REPAIR QUADRICEP TENDON;  Surgeon: Rudean Haskell, MD;  Location: [place];  Service: Orthopedics;  Laterality: Left;  open repair of VMO    quiadricp  10'21/2012   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   Procedure: TOTAL KNEE ARTHROPLASTY;  Surgeon: Annie Main  Mikala Chroman, MD;  Location: [place];  Service: Orthopedics;  Laterality: Left;   TOTAL KNEE ARTHROPLASTY Right 11/04/2013   DR Ronnie Derby   TOTAL KNEE ARTHROPLASTY Right 11/04/2013   Procedure: RIGHT TOTAL KNEE ARTHROPLASTY;  Surgeon: Vickey Huger, MD;  Location: [place];  Service: Orthopedics;  Laterality: Right;   TUBAL LIGATION  1974   vasculitic eruption of legs  1985   HPI:  [name] presented to the [facility] ED after multiple falls. She has been confused according to her son, and has had gait difficulties for a number of weeks to due gait problems leading to the multiple falls. Found to have traumatic subdural  hematoma, traumatic subarachnoid hemorrhage in the left posterior  parietooccipital region  , possible seizure activity. History of CHF, DOE, Afib, venous insufficiency, lymphedema, recent weight gain. Tricuspid regurgitation, elevated pulmonary artery pressure, and morbid obesity. Taking eliquis for atrial fibrillation.   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 Pt is much more successful in responding to Y/N questions that are auditory in nature only. She has severe impairment naming objects presented visually. She is much more successful naming her own body parts touched by therapist with her own left hand. She is unable to read and cannot describe a picture at all. She has a groping behavior when trying to complete a motor task like feeding herself with her right hand, though with extra time she can correct. If asked to complete gestures like pretending to use a toothbrush or blowing out a match she can partially complete the movement with her right hand. Pt will need intensive multidisciplenary interventions. Recommend AIR at d/c.    SLP Assessment  SLP Visit Diagnosis: Dysphagia, unspecified (R13.10)    Recommendations for follow up therapy are one component of a multi-disciplinary discharge planning process, led by the attending physician.  Recommendations may be updated based on patient status, additional functional criteria and insurance authorization.    Follow Up Recommendations  Acute inpatient rehab (3hours/day)    Assistance Recommended at Discharge     Functional Status Assessment    Frequency and Duration min 2x/week  2 weeks      SLP Evaluation Cognition  Overall Cognitive Status: Within Functional Limits for tasks assessed       Comprehension  Auditory Comprehension Overall Auditory Comprehension: Impaired Yes/No Questions: Within Functional Limits Commands:  Impaired One Step Basic Commands: 50-74% accurate Two Step Basic Commands: 0-24% accurate Multistep Basic Commands: 0-24% accurate Complex Commands: 0-24% accurate Conversation: Simple Interfering Components: Motor planning;Visual impairments Reading Comprehension Reading Status: Impaired Word level: Impaired Sentence Level: Impaired    Expression Verbal Expression Overall Verbal Expression: Impaired Initiation: No impairment Automatic Speech: Name;Social Response Level of Generative/Spontaneous Verbalization: Sentence;Conversation Repetition: No impairment Naming: Impairment Responsive: 0-25% accurate Confrontation: Impaired Convergent: Not tested Verbal Errors: Perseveration;Aware of errors Pragmatics: No impairment Written Expression Dominant Hand: Right Written Expression: Exceptions to [facility] Self Formulation Ability:  (unable)   Oral / Motor  Oral Motor/Sensory Function Overall Oral Motor/Sensory Function: Within functional limits Motor Speech Overall Motor Speech: Appears within functional limits for tasks assessed            Jacoba Cherney, Katherene Ponto 11/28/2022, 2:49 PM

## 2022-11-28 NOTE — Progress Notes (Signed)
Cardiology aware of pt low urine output. Urine output is less than 30 mL/hr. They are not concerned about the urine output at this time and are not rescheduling pt home Lasix d/t kidney function. They are considering resuming lasix once fluid volume and kidney levels stabilize.  Will encourage pt to increase fluid intake as the pt has had minimal fluids over the past ~72 hours.   11/28/2022 [name] Gip, RN 5:30 PM

## 2022-11-28 NOTE — Evaluation (Signed)
Clinical/Bedside Swallow Evaluation Patient Details  Name: [name] MRN: [id number] Date of Birth: December 25, 1940  Today's Date: 11/28/2022 Time: SLP Start Time (ACUTE ONLY): 27 SLP Stop Time (ACUTE ONLY): 1315 SLP Time Calculation (min) (ACUTE ONLY): 45 min  Past Medical History:  Past Medical History:  Diagnosis Date   Anemia    Anxiety    Arthritis    Back pain    reason unknown   Cataract    bilateral immature   Diarrhea    Diverticulosis    GERD (gastroesophageal reflux disease)    takes Omeprazole daily   Headache(784.0)    occasionally   Hiatal hernia    History of blood transfusion    no abnormal reaction noted   History of bronchitis 2014   History of colon polyps    History of shingles    Hypertension    takes Metoprolol,Amlodipine and Losartan daily   Internal hemorrhoids    Joint pain    Joint swelling    Lung nodule    Nocturia    Numbness    hands occasionally   Osteoarthritis    Peripheral edema    takes HCTZ daily and Furosemide daily as needed   PONV (postoperative nausea and vomiting)    Redness    to both lower extremities   Shortness of breath    WITH EXERTION    Sleep apnea    doesn't use cpap   Urinary frequency    Urinary urgency    Venous insufficiency    Past Surgical History:  Past Surgical History:  Procedure Laterality Date   ABDOMINAL HYSTERECTOMY  1983   ANKLE SURGERY Left 1993   ANTERIOR CRUCIATE LIGAMENT REPAIR Left 2013   BREAST SURGERY     LEFT SIDE BIOPSY 1989   CHOLECYSTECTOMY  1990   COLONOSCOPY     ENDOVENOUS ABLATION SAPHENOUS VEIN W/ LASER Right 04/17/2017   endovenous laser ablation R GSV by Tinnie Gens MD   ESOPHAGOGASTRODUODENOSCOPY     HAND SURGERY Left 2011   [place]  07/16/2012   Procedure: REPAIR QUADRICEP TENDON;  Surgeon: Rudean Haskell, MD;  Location: [place];  Service: Orthopedics;  Laterality: Left;  open repair of VMO    quiadricp  10'21/2012   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    Procedure: TOTAL KNEE ARTHROPLASTY;  Surgeon: Rudean Haskell, MD;  Location: [place];  Service: Orthopedics;  Laterality: Left;   TOTAL KNEE ARTHROPLASTY Right 11/04/2013   DR Ronnie Derby   TOTAL KNEE ARTHROPLASTY Right 11/04/2013   Procedure: RIGHT TOTAL KNEE ARTHROPLASTY;  Surgeon: Vickey Huger, MD;  Location: [place];  Service: Orthopedics;  Laterality: Right;   TUBAL LIGATION  1974   vasculitic eruption of legs  1985   HPI:  [name] presented to the [facility] ED after multiple falls. She has been confused according to her son, and has had gait difficulties for a number of weeks to due gait problems leading to the multiple falls. Found to have traumatic subdural hematoma, traumatic subarachnoid hemorrhage, possible seizure activity. History of CHF, DOE, Afib, venous insufficiency, lymphedema, recent weight gain. Tricuspid regurgitation, elevated pulmonary artery pressure, and morbid obesity. Taking eliquis for atrial fibrillation.    Assessment / Plan / Recommendation  Clinical Impression  Pt demonstrates coughing when taking consecutive sips during 3 oz water swallow. Pt and son report this is a baseline impairment and pt always takes small sips. SLP observed pt over several oz intake of water and coke  and pt did indeed take only small sips. She was able to masticate solids and consume purees well (though there was significant difficulty with motor planning abilities for feeding). Recommend unrestricted diet with thin liquids and assist at meals for feeding. No f/u needed for swallowing, SLP will follow for language and cognition. See next note. SLP Visit Diagnosis: Dysphagia, unspecified (R13.10)    Aspiration Risk  Mild aspiration risk    Diet Recommendation Regular;Thin liquid   Liquid Administration via: Cup;Straw Medication Administration: Whole meds with liquid Supervision: Patient able to self feed Compensations: Slow rate;Small sips/bites Postural Changes: Seated upright at 90 degrees     Other  Recommendations Oral Care Recommendations: Oral care BID    Recommendations for follow up therapy are one component of a multi-disciplinary discharge planning process, led by the attending physician.  Recommendations may be updated based on patient status, additional functional criteria and insurance authorization.  Follow up Recommendations No SLP follow up (for dysphagia)      Assistance Recommended at Discharge    Functional Status Assessment    Frequency and Duration            Prognosis        Swallow Study   General HPI: [name] presented to the [facility] ED after multiple falls. She has been confused according to her son, and has had gait difficulties for a number of weeks to due gait problems leading to the multiple falls. Found to have traumatic subdural hematoma, traumatic subarachnoid hemorrhage, possible seizure activity. History of CHF, DOE, Afib, venous insufficiency, lymphedema, recent weight gain. Tricuspid regurgitation, elevated pulmonary artery pressure, and morbid obesity. Taking eliquis for atrial fibrillation. Type of Study: Bedside Swallow Evaluation Previous Swallow Assessment: none Diet Prior to this Study: NPO Temperature Spikes Noted: No Respiratory Status: Room air History of Recent Intubation: No Behavior/Cognition: Alert;Cooperative;Pleasant mood Oral Cavity Assessment: Within Functional Limits Oral Care Completed by SLP: Yes Oral Cavity - Dentition: Adequate natural dentition Self-Feeding Abilities: Needs assist Patient Positioning: Upright in bed Baseline Vocal Quality: Normal Volitional Cough: Strong Volitional Swallow: Able to elicit    Oral/Motor/Sensory Function Overall Oral Motor/Sensory Function: Within functional limits   Ice Chips     Thin Liquid Thin Liquid: Impaired Presentation: Cup;Straw Pharyngeal  Phase Impairments: Cough - Immediate    Nectar Thick Nectar Thick Liquid: Not tested   Honey Thick Honey Thick  Liquid: Not tested   Puree Puree: Within functional limits   Solid     Solid: Within functional limits Presentation: Self Fed      Lynann Beaver 11/28/2022,2:09 PM

## 2022-11-28 NOTE — Progress Notes (Signed)
Inpatient Rehab Admissions Coordinator:  ? ?Per therapy recommendations,  patient was screened for CIR candidacy by Janneth Krasner, MS, CCC-SLP. At this time, Pt. Appears to be a a potential candidate for CIR. I will place   order for rehab consult per protocol for full assessment. Please contact me any with questions. ? ?Calum Cormier, MS, CCC-SLP ?Rehab Admissions Coordinator  ?[phone] (celll) ?[phone] (office) ? ?

## 2022-11-28 NOTE — Evaluation (Signed)
Physical Therapy Evaluation Patient Details Name: [name] MRN: [id number] DOB: 1941-05-23 Today's Date: 11/28/2022  History of Present Illness  82 y.o. female presents to [facility] on 11/28/2022 after multiple fall and AMS. 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 PMH includes anxiety, OA, GERD, HTN.  Clinical Impression  Pt presents to PT with deficits in functional mobility, gait, balance, endurance, cognition, motor planning, vision. Pt demonstrates difficulty following motor commands and seems to very poorly follow visual cues (refer to SLP note from this date for further vision deficits). Pt is at a high risk for falls due to weakness and imbalance, appearing to have poor tolerance for weightbearing through LLE (although with poor command following to fully assess ability to step with RLE and bear weight through LLE). Pt will benefit from continued aggressive mobilization in an effort to reduce falls risk and to return to independence. PT recommends AIR consult  at this time.     Recommendations for follow up therapy are one component of a multi-disciplinary discharge planning process, led by the attending physician.  Recommendations may be updated based on patient status, additional functional criteria and insurance authorization.  Follow Up Recommendations Acute inpatient rehab (3hours/day) (if caregivers can be identified) Can patient physically be transported by private vehicle: No    Assistance Recommended at Discharge Frequent or constant Supervision/Assistance  Patient can return home with the following  Two people to help with walking and/or transfers;Two people to help with bathing/dressing/bathroom;Assistance with cooking/housework;Assistance with feeding;Direct supervision/assist for medications management;Direct supervision/assist for financial management;Assist for transportation;Help with stairs or ramp for entrance    Equipment Recommendations  (TBD pending progress)   Recommendations for Other Services  Rehab consult    Functional Status Assessment Patient has had a recent decline in their functional status and demonstrates the ability to make significant improvements in function in a reasonable and predictable amount of time.     Precautions / Restrictions Precautions Precautions: Fall Restrictions Weight Bearing Restrictions: No      Mobility  Bed Mobility Overal bed mobility: Needs Assistance Bed Mobility: Rolling, Sidelying to Sit, Sit to Supine Rolling: Min assist Sidelying to sit: Mod assist, HOB elevated   Sit to supine: Max assist        Transfers Overall transfer level: Needs assistance Equipment used: 1 person hand held assist Transfers: Sit to/from Stand Sit to Stand: Max assist, From elevated surface           General transfer comment: pt stands twice during session    Ambulation/Gait             Pre-gait activities: pt is able to step with left foot, very limited SLS on LLE to attempt step with RLE. Pt with very poor command following and sequencing to march in place or slide laterally at edge of bed    Stairs            Wheelchair Mobility    Modified Rankin (Stroke Patients Only)       Balance Overall balance assessment: Needs assistance Sitting-balance support: Bilateral upper extremity supported, Feet supported Sitting balance-Leahy Scale: Poor     Standing balance support: Bilateral upper extremity supported Standing balance-Leahy Scale: Poor Standing balance comment: modA                             Pertinent Vitals/Pain Pain Assessment Pain Assessment: Faces Faces Pain Scale: Hurts even more Pain Location: L knee Pain Descriptors /  Indicators: Grimacing Pain Intervention(s): Monitored during session    Home Living Family/patient expects to be discharged to:: Private residence Living Arrangements: Alone Available Help at Discharge:  (none identified at this  time) Type of Home: House Home Access: Stairs to enter Entrance Stairs-Rails: Can reach both Entrance Stairs-Number of Steps: 2   Home Layout: One level Home Equipment: Cane - single Barista (2 wheels)      Prior Function Prior Level of Function : Independent/Modified Independent;Patient poor historian/Family not available (history needs to be confirmed with family as pt demonstrates significantly altered mental status)             Mobility Comments: ambulating with cane per patient report       Hand Dominance   Dominant Hand: Right    Extremity/Trunk Assessment   Upper Extremity Assessment Upper Extremity Assessment: Difficult to assess due to impaired cognition (ROM appears [facility])    Lower Extremity Assessment Lower Extremity Assessment: Difficult to assess due to impaired cognition (ROM WFL, significant bruising to L knee, strength at least 3/5 based on observed mobility)    Cervical / Trunk Assessment Cervical / Trunk Assessment: Other exceptions Cervical / Trunk Exceptions: morbid obesity  Communication   Communication: Receptive difficulties  Cognition Arousal/Alertness: Awake/alert Behavior During Therapy: Impulsive Overall Cognitive Status: Impaired/Different from baseline Area of Impairment: Following commands, Safety/judgement, Awareness, Problem solving                       Following Commands: Follows one step commands inconsistently, Follows multi-step commands inconsistently Safety/Judgement: Decreased awareness of safety, Decreased awareness of deficits Awareness: Intellectual Problem Solving: Difficulty sequencing, Requires verbal cues          General Comments General comments (skin integrity, edema, etc.): VSS on RA, facial and L knee bruising    Exercises     Assessment/Plan    PT Assessment Patient needs continued PT services  PT Problem List Decreased strength;Decreased activity tolerance;Decreased  balance;Decreased mobility;Decreased coordination;Decreased cognition;Decreased safety awareness;Decreased knowledge of use of DME;Decreased knowledge of precautions       PT Treatment Interventions DME instruction;Gait training;Functional mobility training;Therapeutic activities;Therapeutic exercise;Balance training;Neuromuscular re-education;Cognitive remediation;Patient/family education    PT Goals (Current goals can be found in the Care Plan section)  Acute Rehab PT Goals Patient Stated Goal: to improve mobility quality, reduce falls risk PT Goal Formulation: With patient Time For Goal Achievement: 12/12/22 Potential to Achieve Goals: Fair    Frequency Min 3X/week     Co-evaluation               AM-PAC PT "6 Clicks" Mobility  Outcome Measure Help needed turning from your back to your side while in a flat bed without using bedrails?: A Little Help needed moving from lying on your back to sitting on the side of a flat bed without using bedrails?: A Lot Help needed moving to and from a bed to a chair (including a wheelchair)?: Total Help needed standing up from a chair using your arms (e.g., wheelchair or bedside chair)?: A Lot Help needed to walk in hospital room?: A Lot Help needed climbing 3-5 steps with a railing? : Total 6 Click Score: 11    End of Session   Activity Tolerance: Patient tolerated treatment well Patient left: in bed;with call bell/phone within reach;with bed alarm set Nurse Communication: Mobility status;Need for lift equipment PT Visit Diagnosis: Other abnormalities of gait and mobility (R26.89);Muscle weakness (generalized) (M62.81);Apraxia (R48.2);Other symptoms and signs involving the nervous  system (R29.898)    Time: [id number] PT Time Calculation (min) (ACUTE ONLY): 21 min   Charges:   PT Evaluation $PT Eval Moderate Complexity: Peggs, PT, DPT Acute Rehabilitation Office [phone]   Zenaida Niece 11/28/2022, 3:23  PM

## 2022-11-28 NOTE — Progress Notes (Signed)
Patient ID: [name], female   DOB: [birth year], 82 y.o.   MRN: [id number] BP (!) 128/59   Pulse 96   Temp 98.3 F (36.8 C) (Axillary)   Resp (!) 23   Ht '5\' 8"'$  (1.727 m)   Wt 131.1 kg   LMP 02/07/2012   SpO2 91%   BMI 43.94 kg/m  Alert, less confused Appreciate cardiology note. Will start iv at 50cc/hr Speech is clear Moving all extremities Head CT for the AM

## 2022-11-28 NOTE — H&P (Signed)
[name] is an 82 y.o. female.   Chief Complaint: traumatic subdural hematoma, traumatic subarachnoid hemorrhage, possible seizure activity, altered mental status x2 weeks HPI: [name] presented to the [facility] ED after multiple falls this past week. She has been confused according to her son, and has had gait difficulties for a number of weeks to due gait problems leading to the multiple falls. History of CHF, DOE, Afib, venous insufficiency, lymphedema, recent weight gain. Tricuspid regurgitation, elevated pulmonary artery pressure, and morbid obesity. Taking eliquis for atrial fibrillation. Is seen by [facility] for cardiovascular treatment.  Brought in yesterday due to increasing weakness, and confusion, nausea and vomiting. Eliquis reversed with Andexxa prior to transfer from [facility]. [name]'s mental status declined during transport last night.  Past Medical History:  Diagnosis Date   Anemia    Anxiety    Arthritis    Back pain    reason unknown   Cataract    bilateral immature   Diarrhea    Diverticulosis    GERD (gastroesophageal reflux disease)    takes Omeprazole daily   Headache(784.0)    occasionally   Hiatal hernia    History of blood transfusion    no abnormal reaction noted   History of bronchitis 2014   History of colon polyps    History of shingles    Hypertension    takes Metoprolol,Amlodipine and Losartan daily   Internal hemorrhoids    Joint pain    Joint swelling    Lung nodule    Nocturia    Numbness    hands occasionally   Osteoarthritis    Peripheral edema    takes HCTZ daily and Furosemide daily as needed   PONV (postoperative nausea and vomiting)    Redness    to both lower extremities   Shortness of breath    WITH EXERTION    Sleep apnea    doesn't use cpap   Urinary frequency    Urinary urgency    Venous insufficiency     Past Surgical History:  Procedure Laterality Date   ABDOMINAL HYSTERECTOMY  1983   ANKLE SURGERY  Left 1993   ANTERIOR CRUCIATE LIGAMENT REPAIR Left 2013   BREAST SURGERY     LEFT SIDE BIOPSY 1989   CHOLECYSTECTOMY  1990   COLONOSCOPY     ENDOVENOUS ABLATION SAPHENOUS VEIN W/ LASER Right 04/17/2017   endovenous laser ablation R GSV by Tinnie Gens MD   ESOPHAGOGASTRODUODENOSCOPY     HAND SURGERY Left 2011   [place]  07/16/2012   Procedure: REPAIR QUADRICEP TENDON;  Surgeon: Rudean Haskell, MD;  Location: [place];  Service: Orthopedics;  Laterality: Left;  open repair of VMO    quiadricp  10'21/2012   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   Procedure: TOTAL KNEE ARTHROPLASTY;  Surgeon: Rudean Haskell, MD;  Location: [place];  Service: Orthopedics;  Laterality: Left;   TOTAL KNEE ARTHROPLASTY Right 11/04/2013   DR Ronnie Derby   TOTAL KNEE ARTHROPLASTY Right 11/04/2013   Procedure: RIGHT TOTAL KNEE ARTHROPLASTY;  Surgeon: Vickey Huger, MD;  Location: [place];  Service: Orthopedics;  Laterality: Right;   TUBAL LIGATION  1974   vasculitic eruption of legs  1985    Family History  Problem Relation Age of Onset   Lymphoma Father    Leukemia Father    Heart attack Father    Hypertension Mother    Emphysema Mother    Heart disease Sister  Rashes / Skin problems Sister    Heart failure Sister    Kidney disease Sister    Heart disease Maternal Grandfather    Heart disease Paternal Grandmother    Heart disease Paternal Grandfather    Social History:  reports that she has never smoked. She has never used smokeless tobacco. She reports that she does not drink alcohol and does not use drugs.  Allergies:  Allergies  Allergen Reactions   Sulfa Antibiotics     Reaction unknown   Adhesive [Tape] Other (See Comments) and Rash    Skin is very sensitive Skin is very sensitive Skin is very sensitive    Medications Prior to Admission  Medication Sig Dispense Refill   acetaminophen (TYLENOL) 500 MG tablet Take 500 mg by mouth every 6 (six) hours as needed for mild pain. For pain      albuterol (VENTOLIN HFA) 108 (90 Base) MCG/ACT inhaler Inhale 2 puffs into the lungs every 6 (six) hours as needed for wheezing or shortness of breath. 1 each 6   apixaban (ELIQUIS) 5 MG TABS tablet Take 1 tablet (5 mg total) by mouth 2 (two) times daily. 180 tablet 3   Biotin 5000 MCG CAPS Take 1 capsule by mouth daily.     Calcium Citrate-Vitamin D 500-12.5 MG-MCG PACK Take by oral route.     ferrous sulfate 325 (65 FE) MG tablet Take 325 mg by mouth 2 (two) times daily with a meal.     furosemide (LASIX) 20 MG tablet Take 2 tablets (40 mg total) by mouth daily. (May take an additional '20mg'$  as needed for weight gain greater than 270lb.) (Patient taking differently: Take 20 mg by mouth in the morning, at noon, and at bedtime. (May take an additional '20mg'$  as needed for weight gain greater than 270lb.)) 90 tablet 6   Glucosamine-Chondroitin (GLUCOSAMINE CHONDR COMPLEX PO) Take 1 capsule by mouth daily.     irbesartan (AVAPRO) 150 MG tablet Take 150 mg by mouth daily.     Magnesium 400 MG TABS Take 1 tablet by mouth 2 (two) times daily. For 4 days, then reduce to 400 mg daily 36 tablet 3   metoprolol tartrate (LOPRESSOR) 50 MG tablet TAKE 1 TABLET BY MOUTH TWICE A DAY (Patient taking differently: Take 50 mg by mouth 2 (two) times daily.) 180 tablet 2   Omega-3 Fatty Acids (OMEGA-3 FISH OIL PO) Take 1 capsule by mouth daily.     omeprazole (PRILOSEC) 40 MG capsule Take 40 mg by mouth every morning.     potassium chloride SA (KLOR-CON M) 20 MEQ tablet Take 2 tablets (40 mEq total) by mouth daily. 60 tablet 3   triamcinolone lotion (KENALOG) 0.1 % Apply 1 application  topically 3 (three) times daily. For legs     vitamin B-12 (CYANOCOBALAMIN) 1000 MCG tablet Take 1,000 mcg by mouth daily.     vitamin E 180 MG (400 UNITS) capsule Take 400 Units by mouth daily.      Results for orders placed or performed during the hospital encounter of 11/27/22 (from the past 48 hour(s))  Ethanol     Status: None    Collection Time: 11/27/22 11:22 PM  Result Value Ref Range   Alcohol, Ethyl (B) <10 <10 mg/dL    Comment: (NOTE) Lowest detectable limit for serum alcohol is 10 mg/dL.  For medical purposes only. Performed at [facility], [address] [place], [place] [address]   Protime-INR     Status: Abnormal  Collection Time: 11/27/22 11:22 PM  Result Value Ref Range   Prothrombin Time 17.0 (H) 11.4 - 15.2 seconds   INR 1.4 (H) 0.8 - 1.2    Comment: (NOTE) INR goal varies based on device and disease states. Performed at [facility], [address], [place], [place] [address]   APTT     Status: None   Collection Time: 11/27/22 11:22 PM  Result Value Ref Range   aPTT 35 24 - 36 seconds    Comment: Performed at [facility], [address], [place], [place] [address]  CBC     Status: Abnormal   Collection Time: 11/27/22 11:22 PM  Result Value Ref Range   WBC 7.0 4.0 - 10.5 K/uL   RBC 4.23 3.87 - 5.11 MIL/uL   Hemoglobin 13.3 12.0 - 15.0 g/dL   HCT 41.7 36.0 - 46.0 %   MCV 98.6 80.0 - 100.0 fL   MCH 31.4 26.0 - 34.0 pg   MCHC 31.9 30.0 - 36.0 g/dL   RDW 15.5 11.5 - 15.5 %   Platelets 131 (L) 150 - 400 K/uL   nRBC 0.0 0.0 - 0.2 %    Comment: Performed at [facility], [address] [place], [place] [address]  Differential     Status: None   Collection Time: 11/27/22 11:22 PM  Result Value Ref Range   Neutrophils Relative % 64 %   Neutro Abs 4.5 1.7 - 7.7 K/uL   Lymphocytes Relative 18 %   Lymphs Abs 1.3 0.7 - 4.0 K/uL   Monocytes Relative 14 %   Monocytes Absolute 1.0 0.1 - 1.0 K/uL   Eosinophils Relative 3 %   Eosinophils Absolute 0.2 0.0 - 0.5 K/uL   Basophils Relative 1 %   Basophils Absolute 0.1 0.0 - 0.1 K/uL   Immature Granulocytes 0 %   Abs Immature Granulocytes 0.02 0.00 - 0.07 K/uL    Comment: Performed at [facility], [address] [place], [place] [address]  Comprehensive metabolic panel     Status: Abnormal   Collection Time: 11/27/22 11:22 PM  Result Value Ref  Range   Sodium 137 135 - 145 mmol/L   Potassium 4.1 3.5 - 5.1 mmol/L   Chloride 102 98 - 111 mmol/L   CO2 25 22 - 32 mmol/L   Glucose, Bld 130 (H) 70 - 99 mg/dL    Comment: Glucose reference range applies only to samples taken after fasting for at least 8 hours.   BUN 25 (H) 8 - 23 mg/dL   Creatinine, Ser 1.30 (H) 0.44 - 1.00 mg/dL   Calcium 9.5 8.9 - 10.3 mg/dL   Total Protein 7.5 6.5 - 8.1 g/dL   Albumin 3.9 3.5 - 5.0 g/dL   AST 24 15 - 41 U/L   ALT 15 0 - 44 U/L   Alkaline Phosphatase 81 38 - 126 U/L   Total Bilirubin 3.9 (H) 0.3 - 1.2 mg/dL   GFR, Estimated 41 (L) >60 mL/min    Comment: (NOTE) Calculated using the CKD-EPI Creatinine Equation (2021)    Anion gap 10 5 - 15    Comment: Performed at [facility], [address], [place], [place] [address]  Resp panel by RT-PCR (RSV, Flu A&B, Covid) Anterior Nasal Swab     Status: None   Collection Time: 11/27/22 11:30 PM   Specimen: Anterior Nasal Swab  Result Value Ref Range   SARS Coronavirus 2 by RT PCR NEGATIVE NEGATIVE    Comment: (NOTE) SARS-CoV-2 target  nucleic acids are NOT DETECTED.  The SARS-CoV-2 RNA is generally detectable in upper respiratory specimens during the acute phase of infection. The lowest concentration of SARS-CoV-2 viral copies this assay can detect is 138 copies/mL. A negative result does not preclude SARS-Cov-2 infection and should not be used as the sole basis for treatment or other patient management decisions. A negative result may occur with  improper specimen collection/handling, submission of specimen other than nasopharyngeal swab, presence of viral mutation(s) within the areas targeted by this assay, and inadequate number of viral copies(<138 copies/mL). A negative result must be combined with clinical observations, patient history, and epidemiological information. The expected result is Negative.  Fact Sheet for Patients:  EntrepreneurPulse.com.au  Fact Sheet for  Healthcare Providers:  IncredibleEmployment.be  This test is no t yet approved or cleared by the [place] FDA and  has been authorized for detection and/or diagnosis of SARS-CoV-2 by FDA under an Emergency Use Authorization (EUA). This EUA will remain  in effect (meaning this test can be used) for the duration of the COVID-19 declaration under Section 564(b)(1) of the Act, 21 U.S.C.section 360bbb-3(b)(1), unless the authorization is terminated  or revoked sooner.       Influenza A by PCR NEGATIVE NEGATIVE   Influenza B by PCR NEGATIVE NEGATIVE    Comment: (NOTE) The Xpert Xpress SARS-CoV-2/FLU/RSV plus assay is intended as an aid in the diagnosis of influenza from Nasopharyngeal swab specimens and should not be used as a sole basis for treatment. Nasal washings and aspirates are unacceptable for Xpert Xpress SARS-CoV-2/FLU/RSV testing.  Fact Sheet for Patients: EntrepreneurPulse.com.au  Fact Sheet for Healthcare Providers: IncredibleEmployment.be  This test is not yet approved or cleared by the [place] FDA and has been authorized for detection and/or diagnosis of SARS-CoV-2 by FDA under an Emergency Use Authorization (EUA). This EUA will remain in effect (meaning this test can be used) for the duration of the COVID-19 declaration under Section 564(b)(1) of the Act, 21 U.S.C. section 360bbb-3(b)(1), unless the authorization is terminated or revoked.     Resp Syncytial Virus by PCR NEGATIVE NEGATIVE    Comment: (NOTE) Fact Sheet for Patients: EntrepreneurPulse.com.au  Fact Sheet for Healthcare Providers: IncredibleEmployment.be  This test is not yet approved or cleared by the [place] FDA and has been authorized for detection and/or diagnosis of SARS-CoV-2 by FDA under an Emergency Use Authorization (EUA). This EUA will remain in effect (meaning this test can be used)  for the duration of the COVID-19 declaration under Section 564(b)(1) of the Act, 21 U.S.C. section 360bbb-3(b)(1), unless the authorization is terminated or revoked.  Performed at [facility], [address] [place], [place] [address]   CBG monitoring, ED     Status: Abnormal   Collection Time: 11/27/22 11:32 PM  Result Value Ref Range   Glucose-Capillary 130 (H) 70 - 99 mg/dL    Comment: Glucose reference range applies only to samples taken after fasting for at least 8 hours.  Type and screen [facility]     Status: None   Collection Time: 11/27/22 11:54 PM  Result Value Ref Range   ABO/RH(D) B POS    Antibody Screen NEG    Sample Expiration      11/30/2022,2359 Performed at [facility], [address] [place], [place] [address]   Urine rapid drug screen (hosp performed)     Status: Abnormal   Collection Time: 11/28/22  3:58 AM  Result Value Ref Range   Opiates NONE DETECTED  NONE DETECTED   Cocaine NONE DETECTED NONE DETECTED   Benzodiazepines POSITIVE (A) NONE DETECTED   Amphetamines NONE DETECTED NONE DETECTED   Tetrahydrocannabinol NONE DETECTED NONE DETECTED   Barbiturates NONE DETECTED NONE DETECTED    Comment: (NOTE) DRUG SCREEN FOR MEDICAL PURPOSES ONLY.  IF CONFIRMATION IS NEEDED FOR ANY PURPOSE, NOTIFY LAB WITHIN 5 DAYS.  LOWEST DETECTABLE LIMITS FOR URINE DRUG SCREEN Drug Class                     Cutoff (ng/mL) Amphetamine and metabolites    1000 Barbiturate and metabolites    200 Benzodiazepine                 200 Opiates and metabolites        300 Cocaine and metabolites        300 THC                            50 Performed at [facility], [place] [address] [place], [place] [address]   Urinalysis, Routine w reflex microscopic -Urine, Clean Catch     Status: Abnormal   Collection Time: 11/28/22  3:58 AM  Result Value Ref Range   Color, Urine AMBER (A) YELLOW    Comment: BIOCHEMICALS MAY BE AFFECTED BY COLOR   APPearance HAZY (A) CLEAR    Specific Gravity, Urine 1.023 1.005 - 1.030   pH 5.0 5.0 - 8.0   Glucose, UA NEGATIVE NEGATIVE mg/dL   Hgb urine dipstick NEGATIVE NEGATIVE   Bilirubin Urine NEGATIVE NEGATIVE   Ketones, ur NEGATIVE NEGATIVE mg/dL   Protein, ur 100 (A) NEGATIVE mg/dL   Nitrite NEGATIVE NEGATIVE   Leukocytes,Ua TRACE (A) NEGATIVE   RBC / HPF 0-5 0 - 5 RBC/hpf   WBC, UA 6-10 0 - 5 WBC/hpf   Bacteria, UA FEW (A) NONE SEEN   Squamous Epithelial / HPF 21-50 0 - 5 /HPF   Mucus PRESENT     Comment: Performed at [facility], [address] [place], [place] [address]   CT Head Wo Contrast  Result Date: 11/28/2022 CLINICAL DATA:  Multiple recent falls.  Change in mental status. EXAM: CT HEAD WITHOUT CONTRAST TECHNIQUE: Contiguous axial images were obtained from the base of the skull through the vertex without intravenous contrast. RADIATION DOSE REDUCTION: This exam was performed according to the departmental dose-optimization program which includes automated exposure control, adjustment of the mA and/or kV according to patient size and/or use of iterative reconstruction technique. COMPARISON:  11/27/2022 FINDINGS: Brain: Again seen is subarachnoid hemorrhage in the left posterior parietooccipital region. Blood along the falx and tentorium also again noted. Left frontal and temporal subdural hematoma again noted, isodense currently, measuring up to 5 mm, stable in thickness. Findings are stable since prior study. No intraparenchymal hemorrhage. No mass effect or midline shift. No hydrocephalus. Vascular: No hyperdense vessel or unexpected calcification. Skull: No acute calvarial abnormality. Sinuses/Orbits: No acute findings Other: None IMPRESSION: Stable subdural and subarachnoid hemorrhage as described above. Electronically Signed   By: Rolm Baptise M.D.   On: 11/28/2022 03:08   DG Knee Left Port  Result Date: 11/28/2022 CLINICAL DATA:  Left knee pain following fall, initial encounter EXAM: PORTABLE LEFT KNEE  - 1-2 VIEW COMPARISON:  None Available. 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 Patella appears somewhat laterally displaced and may be dislocated. Correlate with the clinical exam. No fracture is identified. No  joint effusion is seen. IMPRESSION: Patella appears laterally displaced.  Correlate with physical exam. No other focal abnormality is noted. Electronically Signed   By: Inez Catalina M.D.   On: 11/28/2022 00:33   DG Knee Right Port  Result Date: 11/28/2022 CLINICAL DATA:  Knee pain following fall, initial encounter EXAM: PORTABLE RIGHT KNEE - 4 VIEW COMPARISON:  None Available. FINDINGS: Right knee prosthesis is noted. No findings to suggest loosening are seen. No acute fracture or dislocation is noted. No joint effusion is seen. IMPRESSION: No acute abnormality noted. Electronically Signed   By: Inez Catalina M.D.   On: 11/28/2022 00:32   DG Pelvis Portable  Result Date: 11/28/2022 CLINICAL DATA:  Pelvic pain following fall, initial encounter EXAM: PORTABLE PELVIS 1-2 VIEWS COMPARISON:  None Available. FINDINGS: Pelvic ring is intact. No acute fracture or dislocation is noted. No soft tissue changes are seen. IMPRESSION: No acute abnormality noted. Electronically Signed   By: Inez Catalina M.D.   On: 11/28/2022 00:31   CT HEAD WO CONTRAST  Result Date: 11/28/2022 CLINICAL DATA:  Fall, head, facial, and neck trauma. EXAM: CT HEAD WITHOUT CONTRAST CT MAXILLOFACIAL WITHOUT CONTRAST CT CERVICAL SPINE WITHOUT CONTRAST TECHNIQUE: Multidetector CT imaging of the head, cervical spine, and maxillofacial structures were performed using the standard protocol without intravenous contrast. Multiplanar CT image reconstructions of the cervical spine and maxillofacial structures were also generated. RADIATION DOSE REDUCTION: This exam was performed according to the departmental dose-optimization program which includes automated exposure control, adjustment of the mA and/or kV according to patient size and/or use of  iterative reconstruction technique. COMPARISON:  09/29/2017. FINDINGS: CT HEAD FINDINGS Brain: A subarachnoid hemorrhage is noted overlying the parieto-occipital region on the left. There is a small subdural hematoma along the falx extending along the tentorium cerebelli bilaterally. A small subdural hematoma is noted over the frontal and temporal lobes on the left measuring up to 5 mm. There is mild mass effect on the left cerebral hemisphere with midline shift to the right of approximately 1-2 mm at the level of the septum pellucidum. Gray-white matter differentiation is otherwise within normal limits. No hydrocephalus. Vascular: No hyperdense vessel or unexpected calcification. Skull: Normal. Negative for fracture or focal lesion. Other: Increased density in the scalp over the frontal lobe in the midline, possible contusion given history of trauma. CT MAXILLOFACIAL FINDINGS Osseous: No fracture or mandibular dislocation. No destructive process. Orbits: Negative. No traumatic or inflammatory finding. Sinuses: Mild mucosal thickening in the left maxillary sinus. No air-fluid levels. Soft tissues: No large hematoma is seen. Radiopaque densities are noted along the skin surface in the temporal region on the right. CT CERVICAL SPINE FINDINGS Alignment: There is mild anterolisthesis at C4-C5 and C5-C6. Skull base and vertebrae: No acute fracture. No primary bone lesion or focal pathologic process. Soft tissues and spinal canal: No prevertebral fluid or swelling. No visible canal hematoma. Disc levels: Multilevel intervertebral disc space narrowing, uncovertebral osteophyte formation, and facet arthropathy. Upper chest: No acute abnormality. Other: Carotid artery calcifications are noted. IMPRESSION: 1. Subarachnoid hemorrhage involving the parieto-occipital region on the left. 2. Subdural hematoma along the falx and tentorium cerebelli. Subdural hematoma over the frontal and temporal lobes on the left measuring up to 5  mm with mass effect on the left cerebral hemisphere and midline shift to the right of a proximally 1-2 mm. 3. No evidence of facial bone fracture. 4. Multilevel degenerative changes in the cervical spine without evidence of acute fracture. Critical Value/emergent results were  called by telephone at the time of interpretation on 11/28/2022 at 12:07 am to provider Ripley Fraise , who verbally acknowledged these results. Electronically Signed   By: Brett Fairy M.D.   On: 11/28/2022 00:15   CT CERVICAL SPINE WO CONTRAST  Result Date: 11/28/2022 CLINICAL DATA:  Fall, head, facial, and neck trauma. EXAM: CT HEAD WITHOUT CONTRAST CT MAXILLOFACIAL WITHOUT CONTRAST CT CERVICAL SPINE WITHOUT CONTRAST TECHNIQUE: Multidetector CT imaging of the head, cervical spine, and maxillofacial structures were performed using the standard protocol without intravenous contrast. Multiplanar CT image reconstructions of the cervical spine and maxillofacial structures were also generated. RADIATION DOSE REDUCTION: This exam was performed according to the departmental dose-optimization program which includes automated exposure control, adjustment of the mA and/or kV according to patient size and/or use of iterative reconstruction technique. COMPARISON:  09/29/2017. FINDINGS: CT HEAD FINDINGS Brain: A subarachnoid hemorrhage is noted overlying the parieto-occipital region on the left. There is a small subdural hematoma along the falx extending along the tentorium cerebelli bilaterally. A small subdural hematoma is noted over the frontal and temporal lobes on the left measuring up to 5 mm. There is mild mass effect on the left cerebral hemisphere with midline shift to the right of approximately 1-2 mm at the level of the septum pellucidum. Gray-white matter differentiation is otherwise within normal limits. No hydrocephalus. Vascular: No hyperdense vessel or unexpected calcification. Skull: Normal. Negative for fracture or focal lesion.  Other: Increased density in the scalp over the frontal lobe in the midline, possible contusion given history of trauma. CT MAXILLOFACIAL FINDINGS Osseous: No fracture or mandibular dislocation. No destructive process. Orbits: Negative. No traumatic or inflammatory finding. Sinuses: Mild mucosal thickening in the left maxillary sinus. No air-fluid levels. Soft tissues: No large hematoma is seen. Radiopaque densities are noted along the skin surface in the temporal region on the right. CT CERVICAL SPINE FINDINGS Alignment: There is mild anterolisthesis at C4-C5 and C5-C6. Skull base and vertebrae: No acute fracture. No primary bone lesion or focal pathologic process. Soft tissues and spinal canal: No prevertebral fluid or swelling. No visible canal hematoma. Disc levels: Multilevel intervertebral disc space narrowing, uncovertebral osteophyte formation, and facet arthropathy. Upper chest: No acute abnormality. Other: Carotid artery calcifications are noted. IMPRESSION: 1. Subarachnoid hemorrhage involving the parieto-occipital region on the left. 2. Subdural hematoma along the falx and tentorium cerebelli. Subdural hematoma over the frontal and temporal lobes on the left measuring up to 5 mm with mass effect on the left cerebral hemisphere and midline shift to the right of a proximally 1-2 mm. 3. No evidence of facial bone fracture. 4. Multilevel degenerative changes in the cervical spine without evidence of acute fracture. Critical Value/emergent results were called by telephone at the time of interpretation on 11/28/2022 at 12:07 am to provider Ripley Fraise , who verbally acknowledged these results. Electronically Signed   By: Brett Fairy M.D.   On: 11/28/2022 00:15   CT Maxillofacial Wo Contrast  Result Date: 11/28/2022 CLINICAL DATA:  Fall, head, facial, and neck trauma. EXAM: CT HEAD WITHOUT CONTRAST CT MAXILLOFACIAL WITHOUT CONTRAST CT CERVICAL SPINE WITHOUT CONTRAST TECHNIQUE: Multidetector CT imaging of  the head, cervical spine, and maxillofacial structures were performed using the standard protocol without intravenous contrast. Multiplanar CT image reconstructions of the cervical spine and maxillofacial structures were also generated. RADIATION DOSE REDUCTION: This exam was performed according to the departmental dose-optimization program which includes automated exposure control, adjustment of the mA and/or kV according to patient size and/or  use of iterative reconstruction technique. COMPARISON:  09/29/2017. FINDINGS: CT HEAD FINDINGS Brain: A subarachnoid hemorrhage is noted overlying the parieto-occipital region on the left. There is a small subdural hematoma along the falx extending along the tentorium cerebelli bilaterally. A small subdural hematoma is noted over the frontal and temporal lobes on the left measuring up to 5 mm. There is mild mass effect on the left cerebral hemisphere with midline shift to the right of approximately 1-2 mm at the level of the septum pellucidum. Gray-white matter differentiation is otherwise within normal limits. No hydrocephalus. Vascular: No hyperdense vessel or unexpected calcification. Skull: Normal. Negative for fracture or focal lesion. Other: Increased density in the scalp over the frontal lobe in the midline, possible contusion given history of trauma. CT MAXILLOFACIAL FINDINGS Osseous: No fracture or mandibular dislocation. No destructive process. Orbits: Negative. No traumatic or inflammatory finding. Sinuses: Mild mucosal thickening in the left maxillary sinus. No air-fluid levels. Soft tissues: No large hematoma is seen. Radiopaque densities are noted along the skin surface in the temporal region on the right. CT CERVICAL SPINE FINDINGS Alignment: There is mild anterolisthesis at C4-C5 and C5-C6. Skull base and vertebrae: No acute fracture. No primary bone lesion or focal pathologic process. Soft tissues and spinal canal: No prevertebral fluid or swelling. No  visible canal hematoma. Disc levels: Multilevel intervertebral disc space narrowing, uncovertebral osteophyte formation, and facet arthropathy. Upper chest: No acute abnormality. Other: Carotid artery calcifications are noted. IMPRESSION: 1. Subarachnoid hemorrhage involving the parieto-occipital region on the left. 2. Subdural hematoma along the falx and tentorium cerebelli. Subdural hematoma over the frontal and temporal lobes on the left measuring up to 5 mm with mass effect on the left cerebral hemisphere and midline shift to the right of a proximally 1-2 mm. 3. No evidence of facial bone fracture. 4. Multilevel degenerative changes in the cervical spine without evidence of acute fracture. Critical Value/emergent results were called by telephone at the time of interpretation on 11/28/2022 at 12:07 am to provider Ripley Fraise , who verbally acknowledged these results. Electronically Signed   By: Brett Fairy M.D.   On: 11/28/2022 00:15   DG Chest Portable 1 View  Result Date: 11/27/2022 CLINICAL DATA:  Weakness EXAM: PORTABLE CHEST 1 VIEW COMPARISON:  10/26/2021 FINDINGS: Cardiac shadow is enlarged but stable. Lungs are well aerated bilaterally. Mild central vascular congestion is seen. No focal infiltrate is noted. No bony abnormality is seen. IMPRESSION: Mild central vascular congestion. Electronically Signed   By: Inez Catalina M.D.   On: 11/27/2022 23:55    Review of Systems  Constitutional:  Positive for activity change.  HENT: Negative.    Eyes: Negative.   Respiratory: Negative.    Cardiovascular:  Positive for leg swelling.  Endocrine: Negative.   Genitourinary: Negative.   Musculoskeletal:  Positive for gait problem.  Skin:  Positive for wound.  Allergic/Immunologic: Negative.   Neurological:  Positive for seizures, speech difficulty and weakness.  Hematological:  Bruises/bleeds easily.       Using Eliquis  Psychiatric/Behavioral:  Positive for confusion.     Blood pressure 120/65,  pulse 93, temperature 98 F (36.7 C), temperature source Oral, resp. rate (!) 24, height '5\' 8"'$  (1.727 m), weight 131.1 kg, last menstrual period 02/07/2012, SpO2 100 %. Physical Exam HENT:     Head:     Comments: Multiple ecchymotic areas, forehead, right periorbital, left periorbital Frontotemporal region Various ages with bruises    Right Ear: External ear normal.  Left Ear: External ear normal.     Mouth/Throat:     Mouth: Mucous membranes are dry.  Eyes:     Pupils: Pupils are equal, round, and reactive to light.  Pulmonary:     Effort: Pulmonary effort is normal.  Abdominal:     General: Abdomen is flat.  Musculoskeletal:        General: Swelling present.     Cervical back: Normal range of motion.     Right lower leg: Edema present.     Left lower leg: Edema present.  Skin:    Findings: Bruising present.  Neurological:     Mental Status: She is lethargic and confused.     Cranial Nerves: Cranial nerves 2-12 are intact.     Deep Tendon Reflexes: Babinski sign absent on the right side. Babinski sign absent on the left side.     Comments: Unable to perform detailed motor testing and sensory testing Will follow some commands, moving all extremities purposefully Oriented to person only, not to place, situation, time Did not assess gait due to lethargy  Psychiatric:     Comments: Lethargy precludes psychiatric examination      Assessment/Plan Admit for observation. Head CT repeated showing normal evolution of cerebral blood collections in the subarachnoid and subdural spaces. Will ask ccm for evaluation secondary to lower extremity edema, and medical management.  Ashok Pall, MD 11/28/2022, 6:18 AM

## 2022-11-28 NOTE — ED Provider Notes (Addendum)
Patient transferred from [facility] secondary to head bleed.  Patient has a traumatic head bleed while on Eliquis.  Patient received Andexxa before transfer.  Transport team reports that she has become altered and agitated during the transport.  Physical Exam  BP (!) 136/101 (BP Location: Right Arm)   Pulse (!) 102   Temp 98.1 F (36.7 C) (Oral)   Resp 20   Ht '5\' 8"'$  (1.727 m)   Wt 131.1 kg   LMP 02/07/2012   SpO2 97%   BMI 43.94 kg/m   Physical Exam HENT:     Head:     Comments: Right-sided periorbital bruising Eyes:     Pupils: Pupils are equal, round, and reactive to light.  Cardiovascular:     Rate and Rhythm: Normal rate and regular rhythm.  Pulmonary:     Effort: Pulmonary effort is normal.     Breath sounds: Normal breath sounds.  Abdominal:     Palpations: Abdomen is soft.  Musculoskeletal:     Comments: Rhythmic jerking of right arm and right leg  Skin:    Findings: Bruising present.  Neurological:     Mental Status: She is confused.     Motor: Seizure activity (Right arm and leg) present.     Procedures  Procedures  ED Course / MDM   Patient received as a transfer from [facility].  Transport team report a change in her condition with increasing agitation during transport.  At arrival, patient is still alert and able to answer questions but she is clearly having a focal seizure on the right side.  Patient administered IV Versed and IV Keppra.  Patient's blood pressure is very elevated as well, 190s over 130s.  Cleviprex initiated.  Patient's seizure has stopped.  Repeat head CT without change.  Discussed with Dr. Christella Noa, agrees with this plan.  When I asked if I needed to do anything else for admission, he reported that he had placed the orders for admission.  I do not, however see them in the record.  I will place temporary orders for neuro ICU.  Medical Decision Making Amount and/or Complexity of Data Reviewed Labs: ordered. Radiology: ordered  and independent interpretation performed. Decision-making details documented in ED Course.  Risk Prescription drug management.   CRITICAL CARE Performed by: Orpah Greek   Total critical care time: 33 minutes  Critical care time was exclusive of separately billable procedures and treating other patients.  Critical care was necessary to treat or prevent imminent or life-threatening deterioration.  Critical care was time spent personally by me on the following activities: development of treatment plan with patient and/or surrogate as well as nursing, discussions with consultants, evaluation of patient's response to treatment, examination of patient, obtaining history from patient or surrogate, ordering and performing treatments and interventions, ordering and review of laboratory studies, ordering and review of radiographic studies, pulse oximetry and re-evaluation of patient's condition.        Orpah Greek, MD 11/28/22 [id number]    Orpah Greek, MD 11/28/22 [phone]

## 2022-11-28 NOTE — ED Notes (Signed)
Pt arrived to [facility] ED.

## 2022-11-28 NOTE — ED Notes (Signed)
Daughter at bedside signed for consent to transfer.

## 2022-11-28 NOTE — ED Notes (Signed)
Pt transported to and from CT with this RN on portable monitor.

## 2022-11-29 ENCOUNTER — Inpatient Hospital Stay (HOSPITAL_COMMUNITY): Payer: Medicare Other

## 2022-11-29 DIAGNOSIS — I4821 Permanent atrial fibrillation: Secondary | ICD-10-CM | POA: Diagnosis not present

## 2022-11-29 DIAGNOSIS — I5031 Acute diastolic (congestive) heart failure: Secondary | ICD-10-CM | POA: Diagnosis not present

## 2022-11-29 LAB — CBC
HCT: 37.3 % (ref 36.0–46.0)
Hemoglobin: 12.1 g/dL (ref 12.0–15.0)
MCH: 31.6 pg (ref 26.0–34.0)
MCHC: 32.4 g/dL (ref 30.0–36.0)
MCV: 97.4 fL (ref 80.0–100.0)
Platelets: 123 10*3/uL — ABNORMAL LOW (ref 150–400)
RBC: 3.83 MIL/uL — ABNORMAL LOW (ref 3.87–5.11)
RDW: 15.7 % — ABNORMAL HIGH (ref 11.5–15.5)
WBC: 7 10*3/uL (ref 4.0–10.5)
nRBC: 0 % (ref 0.0–0.2)

## 2022-11-29 LAB — MAGNESIUM: Magnesium: 2.1 mg/dL (ref 1.7–2.4)

## 2022-11-29 LAB — BASIC METABOLIC PANEL
Anion gap: 9 (ref 5–15)
BUN: 23 mg/dL (ref 8–23)
CO2: 27 mmol/L (ref 22–32)
Calcium: 9.4 mg/dL (ref 8.9–10.3)
Chloride: 103 mmol/L (ref 98–111)
Creatinine, Ser: 1.27 mg/dL — ABNORMAL HIGH (ref 0.44–1.00)
GFR, Estimated: 42 mL/min — ABNORMAL LOW (ref >60)
Glucose, Bld: 97 mg/dL (ref 70–99)
Potassium: 4.6 mmol/L (ref 3.5–5.1)
Sodium: 139 mmol/L (ref 135–145)

## 2022-11-29 LAB — ECHOCARDIOGRAM LIMITED
Height: 68 in
S' Lateral: 3 cm
Weight: 4624.02 oz

## 2022-11-29 MED ORDER — DOCUSATE SODIUM 100 MG PO CAPS
100.0000 mg | ORAL_CAPSULE | Freq: Two times a day (BID) | ORAL | Status: DC
  Administered 2022-11-30 – 2022-12-01 (×3): 100 mg via ORAL
  Filled 2022-11-29 (×3): qty 1

## 2022-11-29 MED ORDER — FUROSEMIDE 10 MG/ML IJ SOLN
60.0000 mg | Freq: Once | INTRAMUSCULAR | Status: AC
  Administered 2022-11-29: 60 mg via INTRAVENOUS
  Filled 2022-11-29: qty 6

## 2022-11-29 MED ORDER — ACETAMINOPHEN 325 MG PO TABS
650.0000 mg | ORAL_TABLET | Freq: Four times a day (QID) | ORAL | Status: DC | PRN
  Administered 2022-11-29 – 2022-12-01 (×5): 650 mg via ORAL
  Filled 2022-11-29 (×5): qty 2

## 2022-11-29 MED ORDER — SENNA 8.6 MG PO TABS
1.0000 | ORAL_TABLET | Freq: Every day | ORAL | Status: DC
  Administered 2022-12-01: 8.6 mg via ORAL
  Filled 2022-11-29: qty 1

## 2022-11-29 NOTE — Evaluation (Signed)
Occupational Therapy Evaluation Patient Details Name: [name] MRN: [id number] DOB: 27-Nov-1940 Today's Date: 11/29/2022   History of Present Illness 82 y.o. female presents to [facility] hospital on 11/28/2022 after multiple fall and AMS. 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 PMH includes anxiety, OA, GERD, HTN.   Clinical Impression   Pt currently with functional limitations due to the deficits listed below (see OT Problem List). Prior to admit, pt reports that she was living alone and independent with ADL tasks and functional mobility. She just recently started using a SPC as it made her feel more stable. Pt will benefit from skilled OT to increase their safety and independence with ADL and functional mobility for ADL to facilitate discharge to venue listed below. OT will continue to follow patient acutely.        Recommendations for follow up therapy are one component of a multi-disciplinary discharge planning process, led by the attending physician.  Recommendations may be updated based on patient status, additional functional criteria and insurance authorization.   Follow Up Recommendations  Acute inpatient rehab (3hours/day)     Assistance Recommended at Discharge Intermittent Supervision/Assistance  Patient can return home with the following Two people to help with walking and/or transfers;A lot of help with bathing/dressing/bathroom;Assistance with cooking/housework;Help with stairs or ramp for entrance;Assist for transportation    Functional Status Assessment  Patient has had a recent decline in their functional status and demonstrates the ability to make significant improvements in function in a reasonable and predictable amount of time.  Equipment Recommendations  Other (comment) (defer to next venue of care)       Precautions / Restrictions Precautions Precautions: Fall Restrictions Weight Bearing Restrictions: No      Mobility Bed Mobility Overal bed mobility: Needs  Assistance Bed Mobility: Supine to Sit, Sit to Supine     Supine to sit: Max assist, HOB elevated Sit to supine: Max assist     Patient Response: Anxious, Cooperative  Transfers Overall transfer level:  (Not assessed this date due to safety reasons.)        Balance Overall balance assessment: Needs assistance Sitting-balance support: Bilateral upper extremity supported, Feet supported Sitting balance-Leahy Scale: Poor Sitting balance - Comments: Pt able to maintain sitting balance intermittently when initially transitioning to seated on EOB. Once therapist assisted pt with obtaining a stable base of support, she was able to participate in MMT and coordination assessment.       ADL either performed or assessed with clinical judgement   ADL Overall ADL's : Needs assistance/impaired     Grooming: Wash/dry hands;Wash/dry face;Oral care;Minimal assistance;Cueing for sequencing;Sitting   Upper Body Bathing: Minimal assistance;Sitting   Lower Body Bathing: Total assistance;Bed level   Upper Body Dressing : Minimal assistance;Sitting   Lower Body Dressing: Total assistance;Bed level     Toilet Transfer Details (indicate cue type and reason): Not assessed due to pt's fear of falling Toileting- Clothing Manipulation and Hygiene: Total assistance;Bed level       Vision Baseline Vision/History: 1 Wears glasses Ability to See in Adequate Light: 1 Impaired Patient Visual Report: Blurring of vision;Other (comment) (reports she has experienced a headache since she's been here.) Vision Assessment?: Vision impaired- to be further tested in functional context (Difficult to fully assess while seated on EOB due to difficulty with command following.) Additional Comments: Pt reports that she experiences dizziness.        Praxis Praxis Praxis-Other Comments: Unable to carry out verbal command "touch my finger. Now touch your  nose." Provided hand over hand assist to complete although pt  continued to have difficulty with follow through.    Pertinent Vitals/Pain Pain Assessment Pain Assessment: No/denies pain     Hand Dominance Right   Extremity/Trunk Assessment Upper Extremity Assessment Upper Extremity Assessment: Overall WFL for tasks assessed   Lower Extremity Assessment Lower Extremity Assessment: Defer to PT evaluation   Cervical / Trunk Assessment Cervical / Trunk Assessment: Other exceptions Cervical / Trunk Exceptions: morbid obesity   Communication Communication Communication: Receptive difficulties   Cognition Arousal/Alertness: Awake/alert (preferred to keep her eyes closed versus open. States that it feels better that way.) Behavior During Therapy: Impulsive, Anxious Overall Cognitive Status: Impaired/Different from baseline Area of Impairment: Orientation, Attention, Memory, Following commands, Safety/judgement, Awareness, Problem solving   Orientation Level: Disoriented to, Time (Pt reported date: Oct 29, 2021)   Memory: Decreased short-term memory Following Commands: Follows one step commands inconsistently, Follows one step commands with increased time Safety/Judgement: Decreased awareness of safety, Decreased awareness of deficits   Problem Solving: Difficulty sequencing, Requires verbal cues, Requires tactile cues       General Comments  VSS on RA, facial and Left knee bruising. Bruising noted on inside of right forearm.            Home Living Family/patient expects to be discharged to:: Private residence Living Arrangements: Alone Available Help at Discharge: Other (Comment) (Has 3 adult children and they live at least an hour away) Type of Home: House Home Access: Stairs to enter [employer] of Steps: 2 Entrance Stairs-Rails: Can reach both Home Layout: One level     Bathroom Shower/Tub: Walk-in Hydrologist: Handicapped height Bathroom Accessibility: Yes How Accessible: Accessible via  walker Home Equipment: [place] - single point;Rolling Walker (2 wheels);Grab bars - tub/shower;Grab bars - toilet          Prior Functioning/Environment Prior Level of Function : Independent/Modified Independent     Mobility Comments: Pt reports that recently she started to use a SPC for walking because she felt more secure with it.          OT Problem List: Decreased strength;Decreased cognition;Decreased activity tolerance;Decreased safety awareness;Impaired balance (sitting and/or standing);Impaired vision/perception      OT Treatment/Interventions: Self-care/ADL training;Therapeutic exercise;Therapeutic activities;Cognitive remediation/compensation;Neuromuscular education;Energy conservation;DME and/or AE instruction;Patient/family education;Balance training;Manual therapy;Modalities;Visual/perceptual remediation/compensation    OT Goals(Current goals can be found in the care plan section) Acute Rehab OT Goals Patient Stated Goal: to be able to go home OT Goal Formulation: With patient Time For Goal Achievement: 12/13/22 Potential to Achieve Goals: Good  OT Frequency: Min 2X/week       AM-PAC OT "6 Clicks" Daily Activity     Outcome Measure Help from another person eating meals?: None Help from another person taking care of personal grooming?: A Little Help from another person toileting, which includes using toliet, bedpan, or urinal?: Total Help from another person bathing (including washing, rinsing, drying)?: Total Help from another person to put on and taking off regular upper body clothing?: A Little Help from another person to put on and taking off regular lower body clothing?: Total 6 Click Score: 13   End of Session Nurse Communication: Mobility status  Activity Tolerance: Patient tolerated treatment well;Patient limited by fatigue Patient left: in bed;with call bell/phone within reach;with bed alarm set;with family/visitor present  OT Visit Diagnosis:  Unsteadiness on feet (R26.81);Muscle weakness (generalized) (M62.81);Low vision, both eyes (H54.2)  Time: [id number] OT Time Calculation (min): 44 min Charges:  OT General Charges $OT Visit: 1 Visit OT Evaluation $OT Eval High Complexity: 1 High OT Treatments $Therapeutic Activity: 8-22 mins  Ailene Ravel, OTR/L,CBIS  Supplemental OT - [facility] and [facility] Secure Chat Preferred    Khushbu Pippen, Clarene Duke 11/29/2022, 5:32 PM

## 2022-11-29 NOTE — Progress Notes (Addendum)
Speech Language Pathology Treatment: Cognitive-Linquistic  Patient Details Name: [name] MRN: [id number] DOB: 1940/11/25 Today's Date: 11/29/2022 Time: 0830-0930 SLP Time Calculation (min) (ACUTE ONLY): 60 min  Assessment / Plan / Recommendation Clinical Impression  Pt demonstrates multiple impairments in cognition, language and motor planning but is very participatory and successful with therapeutic activities. Session focused on comparing pts isolated language comprehension and and expression with visual processing with language tasks. Pt demonstrates mild to moderate comprehension deficits with language only typically associated with word recognition. Pt improves with cues. When asked to name an object that therapist describes, pt with 75% accuracy, typically pts errors are perseverations on prior objects. When asked to name an actual object she is holding (hammer, brush, spoon, scissors) pt with 0% accuracy until semantic cues are given. Pt is able to describe the object correctly with choice cues (soft or hard? Metal or wood?). Best function achieved when the object was described to her, then handed to her and passed to her left hand. Pt even less successful in naming line drawings. When a kite was described to pt, she named it, but when a drawing of a kite was then shown to her she called it a "2" which is a perseveration from earlier. Pt had no awareness of the error until told she was incorrect. Will continue efforts. Continue to recommend AIR.   HPI HPI: [name] presented to the [facility] ED after multiple falls. She has been confused according to her son, and has had gait difficulties for a number of weeks to due gait problems leading to the multiple falls. 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 History of CHF, DOE, Afib, venous insufficiency, lymphedema, recent weight gain.  Tricuspid regurgitation, elevated pulmonary artery pressure, and morbid obesity. Taking eliquis for atrial fibrillation.      SLP Plan  Continue with current plan of care      Recommendations for follow up therapy are one component of a multi-disciplinary discharge planning process, led by the attending physician.  Recommendations may be updated based on patient status, additional functional criteria and insurance authorization.    Recommendations                   Plan: Continue with current plan of care           [name], [name]  11/29/2022, 9:37 AM

## 2022-11-29 NOTE — Progress Notes (Signed)
Echocardiogram 2D Echocardiogram has been performed.  [name] 11/29/2022, 10:36 AM

## 2022-11-29 NOTE — Progress Notes (Addendum)
Rounding Note    Patient Name: Wren Deberry Date of Encounter: 11/29/2022   [facility] Cardiologist: Carlyle Dolly, MD   Subjective   Patient   Inpatient Medications    Scheduled Meds:  Chlorhexidine Gluconate Cloth  6 each Topical Daily   cyanocobalamin  1,000 mcg Oral Daily   ferrous sulfate  325 mg Oral BID WC   irbesartan  150 mg Oral Daily   magnesium oxide  400 mg Oral BID   metoprolol tartrate  50 mg Oral BID   pantoprazole  80 mg Oral Daily   potassium chloride SA  40 mEq Oral Daily   Continuous Infusions:  sodium chloride 50 mL/hr at 11/29/22 0800   clevidipine Stopped (11/28/22 0220)   PRN Meds: albuterol, mouth rinse   Vital Signs    Vitals:   11/29/22 0500 11/29/22 0600 11/29/22 0700 11/29/22 0800  BP: (!) 118/104 133/85 (!) 149/79 136/77  Pulse: 99 84 85 88  Resp: (!) 25 (!) 22 (!) 21 (!) 24  Temp:    97.6 F (36.4 C)  TempSrc:    Oral  SpO2: 92% 91% 91% 93%  Weight:      Height:        Intake/Output Summary (Last 24 hours) at 11/29/2022 0857 Last data filed at 11/29/2022 0800 Gross per 24 hour  Intake 760.8 ml  Output 500 ml  Net 260.8 ml      11/27/2022   11:26 PM 11/23/2022    1:27 PM 08/25/2022    3:17 PM  Last 3 Weights  Weight (lbs) 289 lb 289 lb 275 lb 12.8 oz  Weight (kg) 131.09 kg 131.09 kg 125.102 kg      Telemetry    Atrial fibrillation with controlled rates - Personally Reviewed  ECG    No new tracing - Personally Reviewed  Physical Exam   GEN: No acute distress.   Neck: No JVD Cardiac: RRR, no murmurs, rubs, or gallops.  Respiratory: Clear to auscultation bilaterally. GI: Soft, nontender, non-distended  MS: chronic lower extremity skin changes with lymphedema in bilateral lower extremities. Notable swelling as well.   Neuro:  Non-focal. Oriented to place and event only.  Psych: Normal affect   Labs    High Sensitivity Troponin:  No results for input(s): "TROPONINIHS" in the last 720 hours.    Chemistry Recent Labs  Lab 11/23/22 1547 11/27/22 2322  NA 138 137  K 4.6 4.1  CL 99 102  CO2 29 25  GLUCOSE 130* 130*  BUN 25* 25*  CREATININE 1.28* 1.30*  CALCIUM 9.7 9.5  MG 2.0  --   PROT  --  7.5  ALBUMIN  --  3.9  AST  --  24  ALT  --  15  ALKPHOS  --  81  BILITOT  --  3.9*  GFRNONAA 42* 41*  ANIONGAP 10 10    Lipids No results for input(s): "CHOL", "TRIG", "HDL", "LABVLDL", "LDLCALC", "CHOLHDL" in the last 168 hours.  Hematology Recent Labs  Lab 11/27/22 2322  WBC 7.0  RBC 4.23  HGB 13.3  HCT 41.7  MCV 98.6  MCH 31.4  MCHC 31.9  RDW 15.5  PLT 131*   Thyroid No results for input(s): "TSH", "FREET4" in the last 168 hours.  BNP Recent Labs  Lab 11/28/22 1544  BNP 310.4*    DDimer No results for input(s): "DDIMER" in the last 168 hours.   Radiology    CT HEAD WO CONTRAST (5MM)  Result Date: 11/29/2022  CLINICAL DATA:  82 year old female status post falls. Subdural and subarachnoid hemorrhage. Subsequent encounter. EXAM: CT HEAD WITHOUT CONTRAST TECHNIQUE: Contiguous axial images were obtained from the base of the skull through the vertex without intravenous contrast. RADIATION DOSE REDUCTION: This exam was performed according to the departmental dose-optimization program which includes automated exposure control, adjustment of the mA and/or kV according to patient size and/or use of iterative reconstruction technique. COMPARISON:  Head CTs 11/28/2022 and earlier. FINDINGS: Brain: Evidence of nodular hemorrhagic contusion in the left occipital lobe below the parieto-occipital sulcus series 2, image 15. Mild regional edema. Superimposed small volume mostly left posterior convexity subarachnoid blood has not significantly changed. Superimposed left side subdural hematoma has regressed since 11/27/2022, but remains posteriorly up to 8 mm focal thickness at the level of the occiput. But elsewhere hemispheric, tentorial and para falcine blood is 3 mm thickness or less.  No IVH. No ventriculomegaly. No midline shift. Basilar cisterns remain patent. No convincing right hemisphere hemorrhage. Stable gray-white matter differentiation throughout the brain. No cortically based acute infarct identified. Vascular: Calcified atherosclerosis at the skull base. No suspicious intracranial vascular hyperdensity. Skull: No acute osseous abnormality identified. Sinuses/Orbits: Visualized paranasal sinuses and mastoids are stable and well aerated. Other: Stable orbit and scalp soft tissues. Possible mild forehead scalp hematoma or contusion. IMPRESSION: 1. Combined left occipital lobe hemorrhagic contusion, left parieto-occipital subarachnoid hemorrhage, and small left side subdural hematoma. Subdural blood has mildly regressed since 11/27/2022. Other blood products are stable. 2. No significant intracranial mass effect. 3. No new intracranial abnormality. Electronically Signed   By: Genevie Ann M.D.   On: 11/29/2022 06:36   CT Head Wo Contrast  Result Date: 11/28/2022 CLINICAL DATA:  Multiple recent falls.  Change in mental status. EXAM: CT HEAD WITHOUT CONTRAST TECHNIQUE: Contiguous axial images were obtained from the base of the skull through the vertex without intravenous contrast. RADIATION DOSE REDUCTION: This exam was performed according to the departmental dose-optimization program which includes automated exposure control, adjustment of the mA and/or kV according to patient size and/or use of iterative reconstruction technique. COMPARISON:  11/27/2022 FINDINGS: Brain: Again seen is subarachnoid hemorrhage in the left posterior parietooccipital region. Blood along the falx and tentorium also again noted. Left frontal and temporal subdural hematoma again noted, isodense currently, measuring up to 5 mm, stable in thickness. Findings are stable since prior study. No intraparenchymal hemorrhage. No mass effect or midline shift. No hydrocephalus. Vascular: No hyperdense vessel or unexpected  calcification. Skull: No acute calvarial abnormality. Sinuses/Orbits: No acute findings Other: None IMPRESSION: Stable subdural and subarachnoid hemorrhage as described above. Electronically Signed   By: Rolm Baptise M.D.   On: 11/28/2022 03:08   DG Knee Left Port  Result Date: 11/28/2022 CLINICAL DATA:  Left knee pain following fall, initial encounter EXAM: PORTABLE LEFT KNEE - 1-2 VIEW COMPARISON:  None Available. 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 Patella appears somewhat laterally displaced and may be dislocated. Correlate with the clinical exam. No fracture is identified. No joint effusion is seen. IMPRESSION: Patella appears laterally displaced.  Correlate with physical exam. No other focal abnormality is noted. Electronically Signed   By: Inez Catalina M.D.   On: 11/28/2022 00:33   DG Knee Right Port  Result Date: 11/28/2022 CLINICAL DATA:  Knee pain following fall, initial encounter EXAM: PORTABLE RIGHT KNEE - 4 VIEW COMPARISON:  None Available. FINDINGS: Right knee prosthesis is noted. No findings to suggest loosening are seen. No acute fracture or dislocation is noted.  No joint effusion is seen. IMPRESSION: No acute abnormality noted. Electronically Signed   By: Inez Catalina M.D.   On: 11/28/2022 00:32   DG Pelvis Portable  Result Date: 11/28/2022 CLINICAL DATA:  Pelvic pain following fall, initial encounter EXAM: PORTABLE PELVIS 1-2 VIEWS COMPARISON:  None Available. FINDINGS: Pelvic ring is intact. No acute fracture or dislocation is noted. No soft tissue changes are seen. IMPRESSION: No acute abnormality noted. Electronically Signed   By: Inez Catalina M.D.   On: 11/28/2022 00:31   CT HEAD WO CONTRAST  Result Date: 11/28/2022 CLINICAL DATA:  Fall, head, facial, and neck trauma. EXAM: CT HEAD WITHOUT CONTRAST CT MAXILLOFACIAL WITHOUT CONTRAST CT CERVICAL SPINE WITHOUT CONTRAST TECHNIQUE: Multidetector CT imaging of the head, cervical spine, and maxillofacial structures were performed using  the standard protocol without intravenous contrast. Multiplanar CT image reconstructions of the cervical spine and maxillofacial structures were also generated. RADIATION DOSE REDUCTION: This exam was performed according to the departmental dose-optimization program which includes automated exposure control, adjustment of the mA and/or kV according to patient size and/or use of iterative reconstruction technique. COMPARISON:  09/29/2017. FINDINGS: CT HEAD FINDINGS Brain: A subarachnoid hemorrhage is noted overlying the parieto-occipital region on the left. There is a small subdural hematoma along the falx extending along the tentorium cerebelli bilaterally. A small subdural hematoma is noted over the frontal and temporal lobes on the left measuring up to 5 mm. There is mild mass effect on the left cerebral hemisphere with midline shift to the right of approximately 1-2 mm at the level of the septum pellucidum. Gray-white matter differentiation is otherwise within normal limits. No hydrocephalus. Vascular: No hyperdense vessel or unexpected calcification. Skull: Normal. Negative for fracture or focal lesion. Other: Increased density in the scalp over the frontal lobe in the midline, possible contusion given history of trauma. CT MAXILLOFACIAL FINDINGS Osseous: No fracture or mandibular dislocation. No destructive process. Orbits: Negative. No traumatic or inflammatory finding. Sinuses: Mild mucosal thickening in the left maxillary sinus. No air-fluid levels. Soft tissues: No large hematoma is seen. Radiopaque densities are noted along the skin surface in the temporal region on the right. CT CERVICAL SPINE FINDINGS Alignment: There is mild anterolisthesis at C4-C5 and C5-C6. Skull base and vertebrae: No acute fracture. No primary bone lesion or focal pathologic process. Soft tissues and spinal canal: No prevertebral fluid or swelling. No visible canal hematoma. Disc levels: Multilevel intervertebral disc space  narrowing, uncovertebral osteophyte formation, and facet arthropathy. Upper chest: No acute abnormality. Other: Carotid artery calcifications are noted. IMPRESSION: 1. Subarachnoid hemorrhage involving the parieto-occipital region on the left. 2. Subdural hematoma along the falx and tentorium cerebelli. Subdural hematoma over the frontal and temporal lobes on the left measuring up to 5 mm with mass effect on the left cerebral hemisphere and midline shift to the right of a proximally 1-2 mm. 3. No evidence of facial bone fracture. 4. Multilevel degenerative changes in the cervical spine without evidence of acute fracture. Critical Value/emergent results were called by telephone at the time of interpretation on 11/28/2022 at 12:07 am to provider Ripley Fraise , who verbally acknowledged these results. Electronically Signed   By: Brett Fairy M.D.   On: 11/28/2022 00:15   CT CERVICAL SPINE WO CONTRAST  Result Date: 11/28/2022 CLINICAL DATA:  Fall, head, facial, and neck trauma. EXAM: CT HEAD WITHOUT CONTRAST CT MAXILLOFACIAL WITHOUT CONTRAST CT CERVICAL SPINE WITHOUT CONTRAST TECHNIQUE: Multidetector CT imaging of the head, cervical spine, and maxillofacial structures were  performed using the standard protocol without intravenous contrast. Multiplanar CT image reconstructions of the cervical spine and maxillofacial structures were also generated. RADIATION DOSE REDUCTION: This exam was performed according to the departmental dose-optimization program which includes automated exposure control, adjustment of the mA and/or kV according to patient size and/or use of iterative reconstruction technique. COMPARISON:  09/29/2017. FINDINGS: CT HEAD FINDINGS Brain: A subarachnoid hemorrhage is noted overlying the parieto-occipital region on the left. There is a small subdural hematoma along the falx extending along the tentorium cerebelli bilaterally. A small subdural hematoma is noted over the frontal and temporal lobes on  the left measuring up to 5 mm. There is mild mass effect on the left cerebral hemisphere with midline shift to the right of approximately 1-2 mm at the level of the septum pellucidum. Gray-white matter differentiation is otherwise within normal limits. No hydrocephalus. Vascular: No hyperdense vessel or unexpected calcification. Skull: Normal. Negative for fracture or focal lesion. Other: Increased density in the scalp over the frontal lobe in the midline, possible contusion given history of trauma. CT MAXILLOFACIAL FINDINGS Osseous: No fracture or mandibular dislocation. No destructive process. Orbits: Negative. No traumatic or inflammatory finding. Sinuses: Mild mucosal thickening in the left maxillary sinus. No air-fluid levels. Soft tissues: No large hematoma is seen. Radiopaque densities are noted along the skin surface in the temporal region on the right. CT CERVICAL SPINE FINDINGS Alignment: There is mild anterolisthesis at C4-C5 and C5-C6. Skull base and vertebrae: No acute fracture. No primary bone lesion or focal pathologic process. Soft tissues and spinal canal: No prevertebral fluid or swelling. No visible canal hematoma. Disc levels: Multilevel intervertebral disc space narrowing, uncovertebral osteophyte formation, and facet arthropathy. Upper chest: No acute abnormality. Other: Carotid artery calcifications are noted. IMPRESSION: 1. Subarachnoid hemorrhage involving the parieto-occipital region on the left. 2. Subdural hematoma along the falx and tentorium cerebelli. Subdural hematoma over the frontal and temporal lobes on the left measuring up to 5 mm with mass effect on the left cerebral hemisphere and midline shift to the right of a proximally 1-2 mm. 3. No evidence of facial bone fracture. 4. Multilevel degenerative changes in the cervical spine without evidence of acute fracture. Critical Value/emergent results were called by telephone at the time of interpretation on 11/28/2022 at 12:07 am to  provider Ripley Fraise , who verbally acknowledged these results. Electronically Signed   By: Brett Fairy M.D.   On: 11/28/2022 00:15   CT Maxillofacial Wo Contrast  Result Date: 11/28/2022 CLINICAL DATA:  Fall, head, facial, and neck trauma. EXAM: CT HEAD WITHOUT CONTRAST CT MAXILLOFACIAL WITHOUT CONTRAST CT CERVICAL SPINE WITHOUT CONTRAST TECHNIQUE: Multidetector CT imaging of the head, cervical spine, and maxillofacial structures were performed using the standard protocol without intravenous contrast. Multiplanar CT image reconstructions of the cervical spine and maxillofacial structures were also generated. RADIATION DOSE REDUCTION: This exam was performed according to the departmental dose-optimization program which includes automated exposure control, adjustment of the mA and/or kV according to patient size and/or use of iterative reconstruction technique. COMPARISON:  09/29/2017. FINDINGS: CT HEAD FINDINGS Brain: A subarachnoid hemorrhage is noted overlying the parieto-occipital region on the left. There is a small subdural hematoma along the falx extending along the tentorium cerebelli bilaterally. A small subdural hematoma is noted over the frontal and temporal lobes on the left measuring up to 5 mm. There is mild mass effect on the left cerebral hemisphere with midline shift to the right of approximately 1-2 mm at the level of  the septum pellucidum. Gray-white matter differentiation is otherwise within normal limits. No hydrocephalus. Vascular: No hyperdense vessel or unexpected calcification. Skull: Normal. Negative for fracture or focal lesion. Other: Increased density in the scalp over the frontal lobe in the midline, possible contusion given history of trauma. CT MAXILLOFACIAL FINDINGS Osseous: No fracture or mandibular dislocation. No destructive process. Orbits: Negative. No traumatic or inflammatory finding. Sinuses: Mild mucosal thickening in the left maxillary sinus. No air-fluid levels.  Soft tissues: No large hematoma is seen. Radiopaque densities are noted along the skin surface in the temporal region on the right. CT CERVICAL SPINE FINDINGS Alignment: There is mild anterolisthesis at C4-C5 and C5-C6. Skull base and vertebrae: No acute fracture. No primary bone lesion or focal pathologic process. Soft tissues and spinal canal: No prevertebral fluid or swelling. No visible canal hematoma. Disc levels: Multilevel intervertebral disc space narrowing, uncovertebral osteophyte formation, and facet arthropathy. Upper chest: No acute abnormality. Other: Carotid artery calcifications are noted. IMPRESSION: 1. Subarachnoid hemorrhage involving the parieto-occipital region on the left. 2. Subdural hematoma along the falx and tentorium cerebelli. Subdural hematoma over the frontal and temporal lobes on the left measuring up to 5 mm with mass effect on the left cerebral hemisphere and midline shift to the right of a proximally 1-2 mm. 3. No evidence of facial bone fracture. 4. Multilevel degenerative changes in the cervical spine without evidence of acute fracture. Critical Value/emergent results were called by telephone at the time of interpretation on 11/28/2022 at 12:07 am to provider Ripley Fraise , who verbally acknowledged these results. Electronically Signed   By: Brett Fairy M.D.   On: 11/28/2022 00:15   DG Chest Portable 1 View  Result Date: 11/27/2022 CLINICAL DATA:  Weakness EXAM: PORTABLE CHEST 1 VIEW COMPARISON:  10/26/2021 FINDINGS: Cardiac shadow is enlarged but stable. Lungs are well aerated bilaterally. Mild central vascular congestion is seen. No focal infiltrate is noted. No bony abnormality is seen. IMPRESSION: Mild central vascular congestion. Electronically Signed   By: Inez Catalina M.D.   On: 11/27/2022 23:55    Cardiac Studies   11/08/21 TTE   IMPRESSIONS     1. Left ventricular ejection fraction, by estimation, is 60 to 65%. The  left ventricle has normal function. The  left ventricle has no regional  wall motion abnormalities. There is moderate left ventricular hypertrophy.  Left ventricular diastolic  parameters are indeterminate.   2. Right ventricular systolic function is normal. The right ventricular  size is mildly enlarged. There is mildly elevated pulmonary artery  systolic pressure. The estimated right ventricular systolic pressure is  Q000111Q mmHg.   3. Left atrial size was severely dilated.   4. Right atrial size was moderately dilated.   5. The pericardial effusion is circumferential.   6. The mitral valve is normal in structure. Trivial mitral valve  regurgitation. No evidence of mitral stenosis.   7. Tricuspid valve regurgitation is severe.   8. The aortic valve is normal in structure. Aortic valve regurgitation is  not visualized. No aortic stenosis is present.   9. The inferior vena cava is dilated in size with <50% respiratory  variability, suggesting right atrial pressure of 15 mmHg.   FINDINGS   Left Ventricle: Left ventricular ejection fraction, by estimation, is 60  to 65%. The left ventricle has normal function. The left ventricle has no  regional wall motion abnormalities. The left ventricular internal cavity  size was normal in size. There is   moderate left ventricular hypertrophy.  Left ventricular diastolic  parameters are indeterminate.   Right Ventricle: The right ventricular size is mildly enlarged. No  increase in right ventricular wall thickness. Right ventricular systolic  function is normal. There is mildly elevated pulmonary artery systolic  pressure. The tricuspid regurgitant  velocity is 2.53 m/s, and with an assumed right atrial pressure of 15  mmHg, the estimated right ventricular systolic pressure is [id number] mmHg.   Left Atrium: Left atrial size was severely dilated.   Right Atrium: Right atrial size was moderately dilated.   Pericardium: Trivial pericardial effusion is present. The pericardial  effusion is  circumferential.   Mitral Valve: The mitral valve is normal in structure. Trivial mitral  valve regurgitation. No evidence of mitral valve stenosis.   Tricuspid Valve: The tricuspid valve is normal in structure. Tricuspid  valve regurgitation is severe. No evidence of tricuspid stenosis.   Aortic Valve: The aortic valve is normal in structure. Aortic valve  regurgitation is not visualized. No aortic stenosis is present.   Pulmonic Valve: The pulmonic valve was normal in structure. Pulmonic valve  regurgitation is not visualized. No evidence of pulmonic stenosis.   Aorta: The aortic root is normal in size and structure.   Venous: The inferior vena cava is dilated in size with less than 50%  respiratory variability, suggesting right atrial pressure of 15 mmHg.   IAS/Shunts: No atrial level shunt detected by color flow Doppler.   Patient Profile     Kameka Bream is a 82 y.o. female with a hx of chronic diastolic CHF, hypertension, persistent atrial fibrillation, chronic venous insufficiency, OSA who is being seen 11/28/2022 for the evaluation of leg swelling, heart failure at the request of Dr. Christella Noa.   Assessment & Plan    Chronic HFpEF Tricuspid regurgitation   Patient admitted following multiple recent falls and now admitted with subdural hematoma, traumatic subarachnoid hemorrhage. TTE in February 2023 with LVEF 60-65%, tricuspid regurgitation.   BNP elevated to 310.4 though  Limited echo pending today.  Suspect that lower extremity edema is primarily driven by combination of lymphedema and chronic venous insufficiency.     Permanent atrial fibrillation   Patient now with long-standing afib. Minimal symptoms, infrequent palpitations.   Hold Eliquis until cleared by neurosurgery to resume Continue metoprolol tartrate for rate control   Hypertension   Patient on home regimen of Irbesartan '150mg'$ . Somewhat labile BP in the setting of acute illness/subarachnoid hemorrhage.  Would plan to resume tomorrow if hemodynamically stable.      For questions or updates, please contact [place] Please consult [email or web address] for contact info under     Signed, Lily Kocher, PA-C  11/29/2022, 8:57 AM     Personally seen and examined. Agree with APP above with the following comments:  Briefly Chronic diastolic HF, Permanent AF, and Chronic Venous Insufficiency  With OSA CPAP.  She is improving her PO intake.  Has had minimal PO intake, she is struggling to drink out of a cup from a mechanical standpoint.  Distant heart sounds Tele: AF rate controlled Personally reviewed relevant tests; Echo is pending but shows IVC dilation and RV dilation; she has evidence of intravascular volume overload. Would recommend  - I agree with the above but will start with 60 IV lasix.  May increase the dose 11/30/22 as tolerate  Rudean Haskell, MD [address] [place], [place] [address] [phone]  11:07 AM

## 2022-11-29 NOTE — PMR Pre-admission (Signed)
PMR Admission Coordinator Pre-Admission Assessment  Patient: [name] is an 82 y.o., female MRN: [id number] DOB: October 05, 1940 Height: '5\' 8"'$  (172.7 cm) Weight: 131.1 kg  Insurance Information HMO:     PPO:      PCP:      IPA:      80/20:      OTHER:  PRIMARY: Medicare A/B      Policy#: [id number]      Subscriber: pt CM Name:       Phone#:      Fax#:  Pre-Cert#: verified Civil engineer, contracting:  Benefits:  Phone #:      Name:  Eff. Date: A 08/26/05, B 02/24/14     Deduct: [id number]      Out of Pocket Max: n/a      Life Max: n/a CIR: 100%      SNF: 20 full days (20 days remaining) Outpatient: 80%     Co-Pay: 20% Home Health: 100%      Co-Pay:  DME: 80%     Co-Pay: 20% Providers:  SECONDARY: Engineer, production Supplement      Policy#: [id number]     Phone#:   Financial Counselor:       Phone#:   The "Data Collection Information Summary" for patients in Inpatient Rehabilitation Facilities with attached "Privacy Act [place] Records" was provided and verbally reviewed with: Patient and Family  Emergency Contact Information Contact Information     Name Relation Home Work [place] Daughter [phone]     Versie Starks Daughter [phone]  [phone]       Current Medical History  Patient Admitting Diagnosis: SDH/SAH   History of Present Illness: Pt is an 82 y/o female with PMH of anxiety, CHF, AF, lymphedema, venous insufficiency, tricuspid regurg, elevated pulmonary artery pressure, OA, and HTN admitted to [facility] on 11/27/22 following a week long history of increasing weakness, falls, N/V, and AMS.  In ED vitals WNL, labs relatively unremarkable except for BUN/Creatinine 25/1.30, and positive for benzodiazepines.  Pt with multiple ecchymotic areas on forehead, bilateral periorbital areas, and frontotemporal regions.  Bruises are of varying ages.  BLE edema 2/2 lymphedema.  Pt only oriented to person.  Imaging revealed subdural hematoma along the falx and tenotrium  cerebelli as well as the frontal and temporal lobes on the L up to 5 mm with mass effect on the left cerebral hemisphere and MLS to the right, and L traumatic subarachnoid hemorrhage.  Neurosurgery recommended conservative management. Pt did develop an episode of sudden HA and N/V on 3/6.  Repeat head CTs stable.  Therapy ongoing and pt was recommended for CIR.   Complete NIHSS TOTAL: 7  [facility]'s medical record from [facility] has been reviewed by the rehabilitation admission coordinator and physician.  Past Medical History  Past Medical History:  Diagnosis Date   Anemia    Anxiety    Arthritis    Back pain    reason unknown   Cataract    bilateral immature   Diarrhea    Diverticulosis    GERD (gastroesophageal reflux disease)    takes Omeprazole daily   Headache(784.0)    occasionally   Hiatal hernia    History of blood transfusion    no abnormal reaction noted   History of bronchitis 2014   History of colon polyps    History of shingles    Hypertension    takes Metoprolol,Amlodipine and Losartan daily   Internal hemorrhoids  Joint pain    Joint swelling    Lung nodule    Nocturia    Numbness    hands occasionally   Osteoarthritis    Peripheral edema    takes HCTZ daily and Furosemide daily as needed   PONV (postoperative nausea and vomiting)    Redness    to both lower extremities   Shortness of breath    WITH EXERTION    Sleep apnea    doesn't use cpap   Urinary frequency    Urinary urgency    Venous insufficiency     Has the patient had major surgery during 100 days prior to admission? No  Family History   family history includes Emphysema in her mother; Heart attack in her father; Heart disease in her maternal grandfather, paternal grandfather, paternal grandmother, and sister; Heart failure in her sister; Hypertension in her mother; Kidney disease in her sister; Leukemia in her father; Lymphoma in her father; Rashes / Skin problems in her  sister.  Current Medications  Current Facility-Administered Medications:    0.9 %  sodium chloride infusion, , Intravenous, Continuous, Ashok Pall, MD, Last Rate: 50 mL/hr at 12/01/22 0900, Infusion Verify at 12/01/22 0900   acetaminophen (TYLENOL) tablet 650 mg, 650 mg, Oral, Q6H PRN, Ashok Pall, MD, 650 mg at 12/01/22 0527   albuterol (PROVENTIL) (2.5 MG/3ML) 0.083% nebulizer solution 3 mL, 3 mL, Inhalation, Q6H PRN, Ashok Pall, MD   calcium carbonate (TUMS - dosed in mg elemental calcium) chewable tablet 200 mg of elemental calcium, 1 tablet, Oral, BID PRN, Meyran, Ocie Cornfield, NP, 200 mg of elemental calcium at 12/01/22 0445   Chlorhexidine Gluconate Cloth 2 % PADS 6 each, 6 each, Topical, Daily, Ashok Pall, MD, 6 each at 12/01/22 0920   cyanocobalamin (VITAMIN B12) tablet 1,000 mcg, 1,000 mcg, Oral, Daily, Ashok Pall, MD, 1,000 mcg at 12/01/22 0919   docusate sodium (COLACE) capsule 100 mg, 100 mg, Oral, BID, Ashok Pall, MD, 100 mg at 12/01/22 0919   empagliflozin (JARDIANCE) tablet 10 mg, 10 mg, Oral, Daily, Chandrasekhar, Mahesh A, MD, 10 mg at 12/01/22 0919   ferrous sulfate tablet 325 mg, 325 mg, Oral, BID WC, Ashok Pall, MD, 325 mg at 12/01/22 0920   furosemide (LASIX) injection 80 mg, 80 mg, Intravenous, BID, Chandrasekhar, Mahesh A, MD, 80 mg at 12/01/22 0918   irbesartan (AVAPRO) tablet 150 mg, 150 mg, Oral, Daily, Ashok Pall, MD, 150 mg at 12/01/22 0920   magnesium oxide (MAG-OX) tablet 400 mg, 400 mg, Oral, BID, Ashok Pall, MD, 400 mg at 12/01/22 0919   metoprolol tartrate (LOPRESSOR) tablet 50 mg, 50 mg, Oral, BID, Lily Kocher, PA-C, 50 mg at 12/01/22 0919   ondansetron (ZOFRAN) injection 4 mg, 4 mg, Intravenous, Q6H PRN, Ashok Pall, MD   Oral care mouth rinse, 15 mL, Mouth Rinse, PRN, Ashok Pall, MD   pantoprazole (PROTONIX) EC tablet 80 mg, 80 mg, Oral, Daily, Cabbell, Kyle, MD, 80 mg at 12/01/22 0919   potassium chloride SA (KLOR-CON M)  CR tablet 40 mEq, 40 mEq, Oral, Daily, Cabbell, Marylyn Ishihara, MD, 40 mEq at 12/01/22 0919   senna (SENOKOT) tablet 8.6 mg, 1 tablet, Oral, Daily, Cabbell, Kyle, MD, 8.6 mg at 12/01/22 [id number]   spironolactone (ALDACTONE) tablet 25 mg, 25 mg, Oral, Daily, Chandrasekhar, Mahesh A, MD, 25 mg at 12/01/22 0919  Patients Current Diet:  Diet Order             Diet regular Room service appropriate? Yes; Fluid  consistency: Thin  Diet effective now                   Precautions / Restrictions Precautions Precautions: Fall Restrictions Weight Bearing Restrictions: No   Has the patient had 2 or more falls or a fall with injury in the past year? Yes  Prior Activity Level Limited Community (1-2x/wk): indep prior to admit, no DME, not driving  Prior Functional Level Self Care: Did the patient need help bathing, dressing, using the toilet or eating? Independent  Indoor Mobility: Did the patient need assistance with walking from room to room (with or without device)? Independent  Stairs: Did the patient need assistance with internal or external stairs (with or without device)? Independent  Functional Cognition: Did the patient need help planning regular tasks such as shopping or remembering to take medications? Independent  Patient Information Are you of Hispanic, Latino/a,or Spanish origin?: A. No, not of Hispanic, Latino/a, or Spanish origin What is your race?: A. White Do you need or want an interpreter to communicate with a doctor or health care staff?: 0. No  Patient's Response To:  Health Literacy and Transportation Is the patient able to respond to health literacy and transportation needs?: Yes Health Literacy - How often do you need to have someone help you when you read instructions, pamphlets, or other written material from your doctor or pharmacy?: Never In the past 12 months, has lack of transportation kept you from medical appointments or from getting medications?: No In the past 12  months, has lack of transportation kept you from meetings, work, or from getting things needed for daily living?: No  Home Assistive Devices / [place]: Cane - single point, Conservation officer, nature (2 wheels), Grab bars - tub/shower, Grab bars - toilet  Prior Device Use: Indicate devices/aids used by the patient prior to current illness, exacerbation or injury? None of the above  Current Functional Level Cognition  Overall Cognitive Status: Impaired/Different from baseline Current Attention Level: Sustained Orientation Level: Oriented X4 (told me March, 2024 intermittently) Following Commands: Follows one step commands inconsistently, Follows one step commands with increased time Safety/Judgement: Decreased awareness of safety, Decreased awareness of deficits    Extremity Assessment (includes Sensation/Coordination)  Upper Extremity Assessment: Overall WFL for tasks assessed  Lower Extremity Assessment: Defer to PT evaluation    ADLs  Overall ADL's : Needs assistance/impaired Grooming: Wash/dry hands, Wash/dry face, Oral care, Minimal assistance, Cueing for sequencing, Sitting Upper Body Bathing: Minimal assistance, Sitting Lower Body Bathing: Total assistance, Bed level Upper Body Dressing : Minimal assistance, Sitting Lower Body Dressing: Total assistance, Bed level Toilet Transfer Details (indicate cue type and reason): Not assessed due to pt's fear of falling Toileting- Clothing Manipulation and Hygiene: Total assistance, Bed level    Mobility  Overal bed mobility: Needs Assistance Bed Mobility: Supine to Sit, Sit to Supine Rolling: Min assist Sidelying to sit: Mod assist, HOB elevated Supine to sit: +2 for physical assistance, Mod assist, HOB elevated Sit to supine: +2 for physical assistance, Max assist General bed mobility comments: assist for all aspects of mobility, continual verbal cues for sequencing    Transfers  Overall transfer level: Needs  assistance Equipment used: 2 person hand held assist Transfers: Sit to/from Stand Sit to Stand: +2 physical assistance, Max assist General transfer comment: STS x 2 from EOB, increased time to power up    Ambulation / Gait / Stairs / Wheelchair Mobility  Ambulation/Gait General Gait Details: attempted sidestepping bedside. Pt unable  to progress LLE for step. Demo poor motor planning Pre-gait activities: pt is able to step with left foot, very limited SLS on LLE to attempt step with RLE. Pt with very poor command following and sequencing to march in place or slide laterally at edge of bed    Posture / Balance Dynamic Sitting Balance Sitting balance - Comments: Pt able to maintain sitting balance intermittently when initially transitioning to seated on EOB. Once therapist assisted pt with obtaining a stable base of support, she was able to participate in MMT and coordination assessment. Balance Overall balance assessment: Needs assistance Sitting-balance support: Feet supported, Single extremity supported Sitting balance-Leahy Scale: Fair Sitting balance - Comments: Pt able to maintain sitting balance intermittently when initially transitioning to seated on EOB. Once therapist assisted pt with obtaining a stable base of support, she was able to participate in MMT and coordination assessment. Standing balance support: Bilateral upper extremity supported Standing balance-Leahy Scale: Poor Standing balance comment: static stand bedside 1 minute x 2 trials. +2 min assist to maintain static balance    Special needs/care consideration Skin significant bruising throughout, lymphedema on BLEs   Previous Home Environment (from acute therapy documentation) Living Arrangements: Alone Available Help at Discharge: Other (Comment) (Has 3 adult children and they live at least an hour away) Type of Home: House Home Layout: One level Home Access: Stairs to enter Entrance Stairs-Rails: Can reach  both Entrance Stairs-Number of Steps: 2 Bathroom Shower/Tub: Gaffer, Architectural technologist: Handicapped height Bathroom Accessibility: Yes How Accessible: Accessible via walker  Discharge Living Setting Discharge Home Layout: Multi-level Alternate Level Stairs-Rails: Can reach both Alternate Level Stairs-Number of Steps: 3 Discharge Home Access: Stairs to enter Entrance Stairs-Rails: None (door frame) Entrance Stairs-Number of Steps: 1 Discharge Bathroom Shower/Tub: Walk-in shower Discharge Bathroom Toilet: Handicapped height Discharge Bathroom Accessibility: Yes How Accessible: Accessible via walker  Social/Family/Support Systems Anticipated Caregiver: would either need hired caregivers vs transition to SNF pending progress, main contact is Camera operator Information: Diannia Ruder [phone] (she communicates with her siblings reg pt's POC) Ability/Limitations of Caregiver: family not available to assist due to working schedules Caregiver Availability: Other (Comment) (see above) Discharge Plan Discussed with Primary Caregiver: Yes Is Caregiver In Agreement with Plan?: Yes Does Caregiver/Family have Issues with Lodging/Transportation while Pt is in Rehab?: No  Goals Patient/Family Goal for Rehab: PT/OT supervision to min assist, SLP min to mod assist Expected length of stay: 16-18 days Additional Information: discharge plan: family to work on hiring 24/7 caregivers vs transition to SNF (pt does not want SNF placement if possible) Pt/Family Agrees to Admission and willing to participate: Yes Program Orientation Provided & Reviewed with Pt/Caregiver Including Roles  & Responsibilities: Yes Additional Information Needs: 20 SNF days remaining for Medicare  Barriers to Discharge: Decreased caregiver support, Inaccessible home environment  Decrease burden of Care through IP rehab admission: n/a  Possible need for SNF placement upon discharge:  Potentially.  Family aware pt will need 24/7 supervision assist at discharge.  They are looking into hired caregivers vs transition to SNF.   Patient Condition: I have reviewed medical records from [facility], spoken with CM, and patient and daughter. I met with patient at the bedside for inpatient rehabilitation assessment.  Patient will benefit from ongoing PT, OT, and SLP, can actively participate in 3 hours of therapy a day 5 days of the week, and can make measurable gains during the admission.  Patient will also benefit from the coordinated team approach  during an Inpatient Acute Rehabilitation admission.  The patient will receive intensive therapy as well as Rehabilitation physician, nursing, social worker, and care management interventions.  Due to bladder management, bowel management, safety, skin/wound care, disease management, medication administration, pain management, and patient education the patient requires 24 hour a day rehabilitation nursing.  The patient is currently max assist with mobility and basic ADLs.  Discharge setting and therapy post discharge at skilled nursing facility is anticipated.  Patient has agreed to participate in the Acute Inpatient Rehabilitation Program and will admit today.  Preadmission Screen Completed By:  Michel Santee, PT, DPT  12/01/2022 10:10 AM ______________________________________________________________________   Discussed status with Dr. Tressa Busman on 12/01/22  at 10:10 AM  and received approval for admission today.  Admission Coordinator:  Michel Santee, PT, DPT time 10:10 AM Sudie Grumbling 12/01/22    Assessment/Plan: Diagnosis: Does the need for close, 24 hr/day Medical supervision in concert with the patient's rehab needs make it unreasonable for this patient to be served in a less intensive setting? Yes Co-Morbidities requiring supervision/potential complications: Recurrent falls with SAH/SDH, HFpEF, HTN, A fib with AC held d/t hemorrhage, lymphedema,  pulmonary hypertension, vertigo/dizzibess, nausea, urinary incontinence and AKI Due to bladder management, bowel management, safety, skin/wound care, disease management, medication administration, and patient education, does the patient require 24 hr/day rehab nursing? Yes Does the patient require coordinated care of a physician, rehab nurse, PT, OT, and SLP to address physical and functional deficits in the context of the above medical diagnosis(es)? Yes Addressing deficits in the following areas: balance, endurance, locomotion, strength, transferring, bowel/bladder control, bathing, dressing, feeding, grooming, toileting, and cognition Can the patient actively participate in an intensive therapy program of at least 3 hrs of therapy 5 days a week? Yes The potential for patient to make measurable gains while on inpatient rehab is good Anticipated functional outcomes upon discharge from inpatient rehab: min assist PT, min assist OT, mod assist SLP Estimated rehab length of stay to reach the above functional goals is: 16-18 days Anticipated discharge destination: Home with 24/7 hired help vs. SNF 10. Overall Rehab/Functional Prognosis: good   MD Signature:  Gertie Gowda, DO 12/01/2022

## 2022-11-29 NOTE — Progress Notes (Signed)
Inpatient Rehab Coordinator Note:  I met with patient and her daughter, [name], at bedside to discuss CIR recommendations and goals/expectations of CIR stay.  We reviewed 3 hrs/day of therapy, physician follow up, and average length of stay 2 weeks (dependent upon progress) with goals of supervision to min assist.  Pt states she lives alone, and [name] confirms that family is unable to provide assistance as all of her children are still working.  We discussed options of private caregivers, as pt does not want to transition to SNF.  I did reiterate that pt will almost certainly require some sort of supervision/assist 24/7 at discharge, given current cognitive deficits.  We reviewed Medicare benefits and timing of potential transition to rehab in the next few days.   Shann Medal, PT, DPT Admissions Coordinator [phone] 11/29/22  12:24 PM

## 2022-11-29 NOTE — Progress Notes (Signed)
Patient ID: [name], female   DOB: 12/16/1940, 82 y.o.   MRN: [id number] BP 134/66   Pulse 82   Temp 97.8 F (36.6 C) (Oral)   Resp (!) 23   Ht '5\' 8"'$  (1.727 m)   Wt 131.1 kg   LMP 02/07/2012   SpO2 96%   BMI 43.94 kg/m  Head CT shows some improvement. No real change seen. No need for repeat scan.  Alert, oriented x4, speech is clear and fluent Moving all extremities Rehab consult placed

## 2022-11-30 ENCOUNTER — Inpatient Hospital Stay (HOSPITAL_COMMUNITY): Payer: Medicare Other

## 2022-11-30 DIAGNOSIS — I38 Endocarditis, valve unspecified: Secondary | ICD-10-CM

## 2022-11-30 DIAGNOSIS — I5033 Acute on chronic diastolic (congestive) heart failure: Secondary | ICD-10-CM | POA: Diagnosis not present

## 2022-11-30 DIAGNOSIS — G4733 Obstructive sleep apnea (adult) (pediatric): Secondary | ICD-10-CM

## 2022-11-30 DIAGNOSIS — I272 Pulmonary hypertension, unspecified: Secondary | ICD-10-CM

## 2022-11-30 MED ORDER — SPIRONOLACTONE 25 MG PO TABS
25.0000 mg | ORAL_TABLET | Freq: Every day | ORAL | Status: DC
  Administered 2022-11-30 – 2022-12-01 (×2): 25 mg via ORAL
  Filled 2022-11-30 (×2): qty 1

## 2022-11-30 MED ORDER — FUROSEMIDE 10 MG/ML IJ SOLN
80.0000 mg | Freq: Two times a day (BID) | INTRAMUSCULAR | Status: DC
  Administered 2022-11-30 – 2022-12-01 (×3): 80 mg via INTRAVENOUS
  Filled 2022-11-30 (×3): qty 8

## 2022-11-30 MED ORDER — ONDANSETRON HCL 4 MG/2ML IJ SOLN: 4.0000 mg | Freq: Four times a day (QID) | INTRAMUSCULAR | Status: DC | PRN

## 2022-11-30 MED ORDER — ONDANSETRON HCL 4 MG/2ML IJ SOLN
4.0000 mg | Freq: Four times a day (QID) | INTRAMUSCULAR | Status: DC
  Administered 2022-11-30: 4 mg via INTRAVENOUS

## 2022-11-30 MED ORDER — ONDANSETRON HCL 4 MG/2ML IJ SOLN
INTRAMUSCULAR | Status: AC
  Filled 2022-11-30: qty 2

## 2022-11-30 NOTE — Progress Notes (Addendum)
After minimal assistance for set up, patient using left hand to feed herself breakfast! :)  Marked improvement of ataxia from my assessment yesterday.   Montez Hageman RN

## 2022-11-30 NOTE — Progress Notes (Signed)
Physical Therapy Treatment Patient Details Name: [name] MRN: [id number] DOB: Nov 21, 1940 Today's Date: 11/30/2022   History of Present Illness 82 y.o. female [facility] after multiple fall and AMS. 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 PMH includes anxiety, OA, GERD, HTN.    PT Comments    Pt received in bed, agreeable to participation in therapy. RN reports pt able to feed herself breakfast this morning using RUE. Pt required +2 mod assist supine to sit, +2 max assist sit to stand, and +2 max assist sit to supine. Static stand bedside 1 minute x 2 trials, +2 min/HHA to maintain balance. Pt unable to progress LLE for step/gait. Pt supine in bed at end of session.    Recommendations for follow up therapy are one component of a multi-disciplinary discharge planning process, led by the attending physician.  Recommendations may be updated based on patient status, additional functional criteria and insurance authorization.  Follow Up Recommendations  Acute inpatient rehab (3hours/day) (if caregivers can be identified) Can patient physically be transported by private vehicle: No   Assistance Recommended at Discharge Frequent or constant Supervision/Assistance  Patient can return home with the following Two people to help with walking and/or transfers;Two people to help with bathing/dressing/bathroom;Assistance with cooking/housework;Assistance with feeding;Direct supervision/assist for medications management;Direct supervision/assist for financial management;Assist for transportation;Help with stairs or ramp for entrance   Equipment Recommendations   (TBD, pending progress)    Recommendations for Other Services       Precautions / Restrictions Precautions Precautions: Fall Restrictions Weight Bearing Restrictions: No     Mobility  Bed Mobility Overal bed mobility: Needs Assistance Bed Mobility: Supine to Sit, Sit to Supine     Supine to sit: +2 for  physical assistance, Mod assist, HOB elevated Sit to supine: +2 for physical assistance, Max assist   General bed mobility comments: assist for all aspects of mobility, continual verbal cues for sequencing    Transfers Overall transfer level: Needs assistance Equipment used: 2 person hand held assist Transfers: Sit to/from Stand Sit to Stand: +2 physical assistance, Max assist           General transfer comment: STS x 2 from EOB, increased time to power up    Ambulation/Gait               General Gait Details: attempted sidestepping bedside. Pt unable to progress LLE for step. Demo poor Research scientist (physical sciences) Rankin (Stroke Patients Only) Modified Rankin (Stroke Patients Only) Pre-Morbid Rankin Score: No significant disability Modified Rankin: Severe disability     Balance Overall balance assessment: Needs assistance Sitting-balance support: Feet supported, Single extremity supported Sitting balance-Leahy Scale: Fair     Standing balance support: Bilateral upper extremity supported Standing balance-Leahy Scale: Poor Standing balance comment: static stand bedside 1 minute x 2 trials. +2 min assist to maintain static balance                            Cognition Arousal/Alertness: Awake/alert Behavior During Therapy: WFL for tasks assessed/performed Overall Cognitive Status: Impaired/Different from baseline Area of Impairment: Orientation, Attention, Memory, Following commands, Safety/judgement, Awareness, Problem solving                 Orientation Level: Disoriented to, Time (reports Feb 2023) Current Attention Level: Sustained Memory: Decreased short-term memory  Following Commands: Follows one step commands inconsistently, Follows one step commands with increased time Safety/Judgement: Decreased awareness of safety, Decreased awareness of deficits Awareness: Intellectual Problem  Solving: Difficulty sequencing, Requires verbal cues, Requires tactile cues          Exercises      General Comments General comments (skin integrity, edema, etc.): BP 168/86 EOB after standing. Pt with c/o dizziness throughout mobility.      Pertinent Vitals/Pain Pain Assessment Pain Assessment: No/denies pain    Home Living                          Prior Function            PT Goals (current goals can now be found in the care plan section) Acute Rehab PT Goals Patient Stated Goal: not stated Progress towards PT goals: Progressing toward goals    Frequency    Min 3X/week      PT Plan Current plan remains appropriate    Co-evaluation              AM-PAC PT "6 Clicks" Mobility   Outcome Measure  Help needed turning from your back to your side while in a flat bed without using bedrails?: A Little Help needed moving from lying on your back to sitting on the side of a flat bed without using bedrails?: A Lot Help needed moving to and from a bed to a chair (including a wheelchair)?: Total Help needed standing up from a chair using your arms (e.g., wheelchair or bedside chair)?: A Lot Help needed to walk in hospital room?: Total Help needed climbing 3-5 steps with a railing? : Total 6 Click Score: 10    End of Session Equipment Utilized During Treatment: Gait belt Activity Tolerance: Patient tolerated treatment well Patient left: in bed;with call bell/phone within reach;with bed alarm set Nurse Communication: Mobility status PT Visit Diagnosis: Other abnormalities of gait and mobility (R26.89);Muscle weakness (generalized) (M62.81);Apraxia (R48.2);Other symptoms and signs involving the nervous system (R29.898)     Time: [id number] PT Time Calculation (min) (ACUTE ONLY): 23 min  Charges:  $Therapeutic Activity: 23-37 mins                     Gloriann Loan., PT  Office # [phone]    Lorriane Shire 11/30/2022, 10:51 AM

## 2022-11-30 NOTE — Progress Notes (Signed)
Speech Language Pathology Treatment: Cognitive-Linquistic  Patient Details Name: [name] MRN: [id number] DOB: 1940-11-23 Today's Date: 11/30/2022 Time: [id number] SLP Time Calculation (min) (ACUTE ONLY): 45 min  Assessment / Plan / Recommendation Clinical Impression  Pt demonstrates improvement in word finding today. Pt able to name 4/4 objects following description and then visual presentation. Pt also able to name 6/8 novel objects without a description. Pt able to name 1/3 shapes and copy 1/3 shapes, Name 3/3 letters and read 1/8 words. Pt able to describe some pictures on her phone today and talked about her family and dog fairly fluently though we could never find the name of the dog breed despite pt describing it with choice cues (probably due to therapist's lack of knowledge about small white haired yappy dog breeds). Continue to recommend AIR at d/c.    HPI HPI: [name] presented to the [facility] ED after multiple falls. She has been confused according to her son, and has had gait difficulties for a number of weeks to due gait problems leading to the multiple falls. 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 History of CHF, DOE, Afib, venous insufficiency, lymphedema, recent weight gain. Tricuspid regurgitation, elevated pulmonary artery pressure, and morbid obesity. Taking eliquis for atrial fibrillation.      SLP Plan  Continue with current plan of care      Recommendations for follow up therapy are one component of a multi-disciplinary discharge planning process, led by the attending physician.  Recommendations may be updated based on patient status, additional functional criteria and insurance authorization.    Recommendations                   Plan: Continue with current plan of care         Herbie Baltimore, MA CCC-SLP  Acute Rehabilitation  Services Secure Chat Preferred Office [phone]  [name]  11/30/2022, 12:42 PM

## 2022-11-30 NOTE — Progress Notes (Signed)
Progress Note  Patient Name: [name] Date of Encounter: 11/30/2022  Primary Cardiologist: Carlyle Dolly, MD   Subjective   Overnight has improved her diet. Has put out 2 L on her current therapy. No tachypnea on in VS assessment (none on exam).  We reviewed her diagnosis of OSA; she has declined intervention for this in the past  Inpatient Medications    Scheduled Meds:  Chlorhexidine Gluconate Cloth  6 each Topical Daily   cyanocobalamin  1,000 mcg Oral Daily   docusate sodium  100 mg Oral BID   ferrous sulfate  325 mg Oral BID WC   furosemide  80 mg Intravenous BID   irbesartan  150 mg Oral Daily   magnesium oxide  400 mg Oral BID   metoprolol tartrate  50 mg Oral BID   pantoprazole  80 mg Oral Daily   potassium chloride SA  40 mEq Oral Daily   senna  1 tablet Oral Daily   Continuous Infusions:  sodium chloride 50 mL/hr at 11/30/22 0900   clevidipine Stopped (11/28/22 0220)   PRN Meds: acetaminophen, albuterol, mouth rinse   Vital Signs    Vitals:   11/30/22 0600 11/30/22 0700 11/30/22 0800 11/30/22 0900  BP:  134/69 (!) 154/68 130/71  Pulse: 84 83 93 81  Resp: (!) 31 (!) 24 (!) 23 (!) 25  Temp:   97.7 F (36.5 C)   TempSrc:   Axillary   SpO2: 93% 92% 94% 93%  Weight:      Height:        Intake/Output Summary (Last 24 hours) at 11/30/2022 0954 Last data filed at 11/30/2022 0900 Gross per 24 hour  Intake 1199.92 ml  Output 1600 ml  Net -400.08 ml   Filed Weights   11/27/22 2326  Weight: 131.1 kg    Telemetry    A fib with rare PVCs - Personally Reviewed  ECG    No new - Personally Reviewed  Physical Exam   Gen: No distress, Morbid Obesity   Neck: Thick ned Cardiac: No Rubs or Gallops, systolic murmur, IRIR rhythm; no RV heave Respiratory: Clear to auscultation bilaterally, normal effort, normal  respiratory rate GI: Soft, nontender, non-distended  MS: bilateral, slightly painful edema;  moves all extremities Integument: chronic  venous changes Psych: Normal affect, patient feels well   Labs    Chemistry Recent Labs  Lab 11/23/22 1547 11/27/22 2322 11/29/22 0829  NA 138 137 139  K 4.6 4.1 4.6  CL 99 102 103  CO2 '29 25 27  '$ GLUCOSE 130* 130* 97  BUN 25* 25* 23  CREATININE 1.28* 1.30* 1.27*  CALCIUM 9.7 9.5 9.4  PROT  --  7.5  --   ALBUMIN  --  3.9  --   AST  --  24  --   ALT  --  15  --   ALKPHOS  --  81  --   BILITOT  --  3.9*  --   GFRNONAA 42* 41* 42*  ANIONGAP '10 10 9     '$ Hematology Recent Labs  Lab 11/27/22 2322 11/29/22 0829  WBC 7.0 7.0  RBC 4.23 3.83*  HGB 13.3 12.1  HCT 41.7 37.3  MCV 98.6 97.4  MCH 31.4 31.6  MCHC 31.9 32.4  RDW 15.5 15.7*  PLT 131* 123*    Cardiac EnzymesNo results for input(s): "TROPONINI" in the last 168 hours. No results for input(s): "TROPIPOC" in the last 168 hours.   BNP Recent Labs  Lab 11/28/22 1544  BNP 310.4*     DDimer No results for input(s): "DDIMER" in the last 168 hours.   Radiology    ECHOCARDIOGRAM LIMITED  Result Date: 11/29/2022    ECHOCARDIOGRAM LIMITED REPORT   Patient Name:   [name] Date of Exam: 11/29/2022 Medical Rec #:  [id number]    Height:       68.0 in Accession #:    [id number]   Weight:       289.0 lb Date of Birth:  1941/06/07     BSA:          2.390 m Patient Age:    82 years     BP:           149/79 mmHg Patient Gender: F            HR:           80 bpm. Exam Location:  Inpatient Procedure: Limited Echo and Limited Color Doppler Indications:    CHF-Acute Diastolic [id number]  History:        Patient has prior history of Echocardiogram examinations, most                 recent 11/08/2021. CHF, Arrythmias:Atrial Fibrillation,                 Signs/Symptoms:Shortness of Breath; Risk Factors:Sleep Apnea.                 Varicose veins of right lower extremity with complications                 Chronic renal disease, stage 3.  Sonographer:    Ronny Flurry Referring Phys: [id number] [place] A Rosha Cocker IMPRESSIONS  1. Left  ventricular ejection fraction, by estimation, is 55 to 60%. The left ventricle has normal function. Left ventricular endocardial border not optimally defined to evaluate regional wall motion. There is mild concentric left ventricular hypertrophy. Left ventricular diastolic function could not be evaluated. There is the interventricular septum is flattened in systole and diastole, consistent with right ventricular pressure and volume overload.  2. Right ventricular systolic function is moderately reduced. The right ventricular size is severely enlarged. There is mildly elevated pulmonary artery systolic pressure. The estimated right ventricular systolic pressure is [id number] mmHg.  3. Left atrial size was mildly dilated.  4. Right atrial size was mild to moderately dilated.  5. The mitral valve is grossly normal. Mild to moderate mitral valve regurgitation. No evidence of mitral stenosis.  6. The tricuspid valve is abnormal. Tricuspid valve regurgitation is severe.  7. The aortic valve is tricuspid. Aortic valve regurgitation is not visualized.  8. The inferior vena cava is dilated in size with <50% respiratory variability, suggesting right atrial pressure of 15 mmHg. Comparison(s): Changes from prior study are noted. RV now severely enlarged with moderately reduced function. Severe TR. Conclusion(s)/Recommendation(s): Findings consistent with Cor Pulmonale. FINDINGS  Left Ventricle: Left ventricular ejection fraction, by estimation, is 55 to 60%. The left ventricle has normal function. Left ventricular endocardial border not optimally defined to evaluate regional wall motion. The left ventricular internal cavity size was normal in size. There is mild concentric left ventricular hypertrophy. The interventricular septum is flattened in systole and diastole, consistent with right ventricular pressure and volume overload. Left ventricular diastolic function could not be evaluated. Left ventricular diastolic function could not  be evaluated due to atrial fibrillation. Right Ventricle: The right ventricular size is severely enlarged. No increase in right ventricular wall  thickness. Right ventricular systolic function is moderately reduced. There is mildly elevated pulmonary artery systolic pressure. The tricuspid regurgitant velocity is 2.47 m/s, and with an assumed right atrial pressure of 15 mmHg, the estimated right ventricular systolic pressure is [id number] mmHg. Left Atrium: Left atrial size was mildly dilated. Right Atrium: Right atrial size was mild to moderately dilated. Pericardium: Trivial pericardial effusion is present. Mitral Valve: The mitral valve is grossly normal. Mild to moderate mitral valve regurgitation. No evidence of mitral valve stenosis. Tricuspid Valve: The tricuspid valve is abnormal. Tricuspid valve regurgitation is severe. No evidence of tricuspid stenosis. Aortic Valve: The aortic valve is tricuspid. Aortic valve regurgitation is not visualized. Aorta: The aortic root and ascending aorta are structurally normal, with no evidence of dilitation. Venous: The inferior vena cava is dilated in size with less than 50% respiratory variability, suggesting right atrial pressure of 15 mmHg. IAS/Shunts: The atrial septum is grossly normal. LEFT VENTRICLE PLAX 2D LVIDd:         4.50 cm   Diastology LVIDs:         3.00 cm   LV e' medial:  9.36 cm/s LV PW:         1.50 cm   LV e' lateral: 14.30 cm/s LV IVS:        1.20 cm LVOT diam:     1.90 cm LVOT Area:     2.84 cm  RIGHT VENTRICLE             IVC RV S prime:     11.30 cm/s  IVC diam: 3.60 cm TAPSE (M-mode): 1.4 cm LEFT ATRIUM         Index LA diam:    4.50 cm 1.88 cm/m   AORTA Ao Root diam: 3.20 cm Ao Asc diam:  2.90 cm TRICUSPID VALVE TR Peak grad:   24.4 mmHg TR Vmax:        247.00 cm/s  SHUNTS Systemic Diam: 1.90 cm Eleonore Chiquito MD Electronically signed by Eleonore Chiquito MD Signature Date/Time: 11/29/2022/12:09:56 PM    Final    CT HEAD WO CONTRAST (5MM)  Result Date:  11/29/2022 CLINICAL DATA:  82 year old female status post falls. Subdural and subarachnoid hemorrhage. Subsequent encounter. EXAM: CT HEAD WITHOUT CONTRAST TECHNIQUE: Contiguous axial images were obtained from the base of the skull through the vertex without intravenous contrast. RADIATION DOSE REDUCTION: This exam was performed according to the departmental dose-optimization program which includes automated exposure control, adjustment of the mA and/or kV according to patient size and/or use of iterative reconstruction technique. COMPARISON:  Head CTs 11/28/2022 and earlier. FINDINGS: Brain: Evidence of nodular hemorrhagic contusion in the left occipital lobe below the parieto-occipital sulcus series 2, image 15. Mild regional edema. Superimposed small volume mostly left posterior convexity subarachnoid blood has not significantly changed. Superimposed left side subdural hematoma has regressed since 11/27/2022, but remains posteriorly up to 8 mm focal thickness at the level of the occiput. But elsewhere hemispheric, tentorial and para falcine blood is 3 mm thickness or less. No IVH. No ventriculomegaly. No midline shift. Basilar cisterns remain patent. No convincing right hemisphere hemorrhage. Stable gray-white matter differentiation throughout the brain. No cortically based acute infarct identified. Vascular: Calcified atherosclerosis at the skull base. No suspicious intracranial vascular hyperdensity. Skull: No acute osseous abnormality identified. Sinuses/Orbits: Visualized paranasal sinuses and mastoids are stable and well aerated. Other: Stable orbit and scalp soft tissues. Possible mild forehead scalp hematoma or contusion. IMPRESSION: 1. Combined left occipital lobe hemorrhagic contusion, left  parieto-occipital subarachnoid hemorrhage, and small left side subdural hematoma. Subdural blood has mildly regressed since 11/27/2022. Other blood products are stable. 2. No significant intracranial mass effect. 3. No  new intracranial abnormality. Electronically Signed   By: Genevie Ann M.D.   On: 11/29/2022 06:36     Patient Profile     82 y.o. 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 - WHO Functional Class unclear but asymptomatic at rest, Stage C, hypervolemic, WHO II & III in the setting of Morbid Obesity, HFpEF, Untreated OSA - Diuretic regimen: Increasing to 80 IV BID Diuretics and adding aldactone 25 mg Po daily - Fluid restriction of < 2 L  - No signs or symptoms of connective tissue disease and no family history of the CT or PH  - planned for conservative inpatient diagnostics; defer High res CT, PFTs, Hepatic Duplex as outpatient; defer RHC for now; will need outpatient f/u with sleep medicine; would be reasonable outpatient GLP-1 therapy candidate - repeat TSH for tomorrow  HTN - MRA as above, may add SGLT2i 12/01/22, continue ARB  Permanent AF complicated by [facility] - Continue BB for now - DOAC when cleared by primary    For questions or updates, please contact [facility] Please consult [email or web address] for contact info under Cardiology/STEMI.      Rudean Haskell, MD [facility] [place] [place], [place] [address] [phone]  9:54 AM

## 2022-11-30 NOTE — Progress Notes (Signed)
Patient had a sudden onset 8/10 headache and became very nauseous. One emesis episode. Dr Christella Noa notified immediately and PRN zofran given and STAT CT head was obtained.   Montez Hageman RN

## 2022-11-30 NOTE — Progress Notes (Addendum)
Inpatient Rehab Admissions Coordinator:   I have a bed for this patient to admit to CIR today.  Awaiting confirmation from Dr. Christella Noa that she is ready.   1245: Discussed with RN. Will hold admit for today and plan potentially for tomorrow depending on results of head CT and any potential further workup per Dr. Christella Noa.  Will follow.   Shann Medal, PT, DPT Admissions Coordinator [phone] 11/30/22  10:13 AM

## 2022-12-01 ENCOUNTER — Inpatient Hospital Stay (HOSPITAL_COMMUNITY)
Admission: RE | Admit: 2022-12-01 | Discharge: 2022-12-13 | DRG: 945 | Disposition: A | Discharge status: Home / Self Care | Payer: Medicare Other | Source: Intra-hospital | Attending: Physical Medicine and Rehabilitation | Admitting: Physical Medicine and Rehabilitation

## 2022-12-01 ENCOUNTER — Other Ambulatory Visit: Payer: Self-pay

## 2022-12-01 ENCOUNTER — Encounter (HOSPITAL_COMMUNITY): Payer: Self-pay | Admitting: Physical Medicine and Rehabilitation

## 2022-12-01 DIAGNOSIS — E1122 Type 2 diabetes mellitus with diabetic chronic kidney disease: Secondary | ICD-10-CM | POA: Diagnosis present

## 2022-12-01 DIAGNOSIS — R600 Localized edema: Secondary | ICD-10-CM | POA: Diagnosis not present

## 2022-12-01 DIAGNOSIS — I609 Nontraumatic subarachnoid hemorrhage, unspecified: Secondary | ICD-10-CM | POA: Diagnosis not present

## 2022-12-01 DIAGNOSIS — Z8616 Personal history of COVID-19: Secondary | ICD-10-CM | POA: Diagnosis not present

## 2022-12-01 DIAGNOSIS — L03116 Cellulitis of left lower limb: Secondary | ICD-10-CM | POA: Diagnosis present

## 2022-12-01 DIAGNOSIS — Z8249 Family history of ischemic heart disease and other diseases of the circulatory system: Secondary | ICD-10-CM | POA: Diagnosis not present

## 2022-12-01 DIAGNOSIS — I2729 Other secondary pulmonary hypertension: Secondary | ICD-10-CM | POA: Diagnosis present

## 2022-12-01 DIAGNOSIS — M7989 Other specified soft tissue disorders: Secondary | ICD-10-CM | POA: Diagnosis present

## 2022-12-01 DIAGNOSIS — R569 Unspecified convulsions: Secondary | ICD-10-CM | POA: Diagnosis present

## 2022-12-01 DIAGNOSIS — S069X9S Unspecified intracranial injury with loss of consciousness of unspecified duration, sequela: Secondary | ICD-10-CM | POA: Diagnosis not present

## 2022-12-01 DIAGNOSIS — I5043 Acute on chronic combined systolic (congestive) and diastolic (congestive) heart failure: Secondary | ICD-10-CM | POA: Diagnosis not present

## 2022-12-01 DIAGNOSIS — I872 Venous insufficiency (chronic) (peripheral): Secondary | ICD-10-CM | POA: Diagnosis present

## 2022-12-01 DIAGNOSIS — Z825 Family history of asthma and other chronic lower respiratory diseases: Secondary | ICD-10-CM

## 2022-12-01 DIAGNOSIS — I4821 Permanent atrial fibrillation: Secondary | ICD-10-CM | POA: Diagnosis present

## 2022-12-01 DIAGNOSIS — I5033 Acute on chronic diastolic (congestive) heart failure: Secondary | ICD-10-CM | POA: Diagnosis present

## 2022-12-01 DIAGNOSIS — W19XXXD Unspecified fall, subsequent encounter: Secondary | ICD-10-CM | POA: Diagnosis present

## 2022-12-01 DIAGNOSIS — N1831 Chronic kidney disease, stage 3a: Secondary | ICD-10-CM | POA: Diagnosis present

## 2022-12-01 DIAGNOSIS — M1990 Unspecified osteoarthritis, unspecified site: Secondary | ICD-10-CM | POA: Diagnosis present

## 2022-12-01 DIAGNOSIS — Z841 Family history of disorders of kidney and ureter: Secondary | ICD-10-CM

## 2022-12-01 DIAGNOSIS — I130 Hypertensive heart and chronic kidney disease with heart failure and stage 1 through stage 4 chronic kidney disease, or unspecified chronic kidney disease: Secondary | ICD-10-CM | POA: Diagnosis present

## 2022-12-01 DIAGNOSIS — Z7984 Long term (current) use of oral hypoglycemic drugs: Secondary | ICD-10-CM

## 2022-12-01 DIAGNOSIS — Z6841 Body Mass Index (BMI) 40.0 and over, adult: Secondary | ICD-10-CM

## 2022-12-01 DIAGNOSIS — N390 Urinary tract infection, site not specified: Secondary | ICD-10-CM | POA: Diagnosis not present

## 2022-12-01 DIAGNOSIS — E876 Hypokalemia: Secondary | ICD-10-CM | POA: Diagnosis present

## 2022-12-01 DIAGNOSIS — Z8619 Personal history of other infectious and parasitic diseases: Secondary | ICD-10-CM

## 2022-12-01 DIAGNOSIS — I890 Lymphedema, not elsewhere classified: Secondary | ICD-10-CM | POA: Diagnosis present

## 2022-12-01 DIAGNOSIS — Z807 Family history of other malignant neoplasms of lymphoid, hematopoietic and related tissues: Secondary | ICD-10-CM

## 2022-12-01 DIAGNOSIS — E611 Iron deficiency: Secondary | ICD-10-CM | POA: Diagnosis present

## 2022-12-01 DIAGNOSIS — I619 Nontraumatic intracerebral hemorrhage, unspecified: Secondary | ICD-10-CM

## 2022-12-01 DIAGNOSIS — I10 Essential (primary) hypertension: Secondary | ICD-10-CM | POA: Diagnosis not present

## 2022-12-01 DIAGNOSIS — S066XAD Traumatic subarachnoid hemorrhage with loss of consciousness status unknown, subsequent encounter: Secondary | ICD-10-CM

## 2022-12-01 DIAGNOSIS — I5082 Biventricular heart failure: Secondary | ICD-10-CM | POA: Diagnosis present

## 2022-12-01 DIAGNOSIS — Z96651 Presence of right artificial knee joint: Secondary | ICD-10-CM | POA: Diagnosis present

## 2022-12-01 DIAGNOSIS — S065XAD Traumatic subdural hemorrhage with loss of consciousness status unknown, subsequent encounter: Secondary | ICD-10-CM | POA: Diagnosis present

## 2022-12-01 DIAGNOSIS — I83891 Varicose veins of right lower extremities with other complications: Secondary | ICD-10-CM | POA: Diagnosis present

## 2022-12-01 DIAGNOSIS — R197 Diarrhea, unspecified: Secondary | ICD-10-CM | POA: Diagnosis not present

## 2022-12-01 DIAGNOSIS — S069XAA Unspecified intracranial injury with loss of consciousness status unknown, initial encounter: Principal | ICD-10-CM

## 2022-12-01 DIAGNOSIS — I5023 Acute on chronic systolic (congestive) heart failure: Secondary | ICD-10-CM | POA: Diagnosis not present

## 2022-12-01 DIAGNOSIS — Z91048 Other nonmedicinal substance allergy status: Secondary | ICD-10-CM

## 2022-12-01 DIAGNOSIS — Z79899 Other long term (current) drug therapy: Secondary | ICD-10-CM | POA: Diagnosis not present

## 2022-12-01 DIAGNOSIS — Z86010 Personal history of colonic polyps: Secondary | ICD-10-CM

## 2022-12-01 DIAGNOSIS — Z7901 Long term (current) use of anticoagulants: Secondary | ICD-10-CM

## 2022-12-01 DIAGNOSIS — I2781 Cor pulmonale (chronic): Secondary | ICD-10-CM | POA: Diagnosis present

## 2022-12-01 DIAGNOSIS — F411 Generalized anxiety disorder: Secondary | ICD-10-CM | POA: Diagnosis not present

## 2022-12-01 DIAGNOSIS — K219 Gastro-esophageal reflux disease without esophagitis: Secondary | ICD-10-CM | POA: Diagnosis present

## 2022-12-01 DIAGNOSIS — G4733 Obstructive sleep apnea (adult) (pediatric): Secondary | ICD-10-CM | POA: Diagnosis present

## 2022-12-01 DIAGNOSIS — S069X1S Unspecified intracranial injury with loss of consciousness of 30 minutes or less, sequela: Secondary | ICD-10-CM | POA: Diagnosis not present

## 2022-12-01 DIAGNOSIS — B9620 Unspecified Escherichia coli [E. coli] as the cause of diseases classified elsewhere: Secondary | ICD-10-CM | POA: Diagnosis not present

## 2022-12-01 DIAGNOSIS — R3915 Urgency of urination: Secondary | ICD-10-CM | POA: Diagnosis present

## 2022-12-01 DIAGNOSIS — Z806 Family history of leukemia: Secondary | ICD-10-CM

## 2022-12-01 DIAGNOSIS — Z882 Allergy status to sulfonamides status: Secondary | ICD-10-CM

## 2022-12-01 DIAGNOSIS — I272 Pulmonary hypertension, unspecified: Secondary | ICD-10-CM | POA: Diagnosis not present

## 2022-12-01 DIAGNOSIS — S069XAS Unspecified intracranial injury with loss of consciousness status unknown, sequela: Secondary | ICD-10-CM | POA: Diagnosis not present

## 2022-12-01 LAB — TSH: TSH: 3.248 u[IU]/mL (ref 0.350–4.500)

## 2022-12-01 MED ORDER — MECLIZINE HCL 25 MG PO TABS: 12.5000 mg | ORAL_TABLET | Freq: Three times a day (TID) | ORAL | Status: DC | PRN

## 2022-12-01 MED ORDER — POLYETHYLENE GLYCOL 3350 17 G PO PACK: 17.0000 g | PACK | Freq: Every day | ORAL | Status: DC | PRN

## 2022-12-01 MED ORDER — SODIUM CHLORIDE 0.9 % IV SOLN: INTRAVENOUS | Status: DC

## 2022-12-01 MED ORDER — METOPROLOL TARTRATE 50 MG PO TABS
50.0000 mg | ORAL_TABLET | Freq: Two times a day (BID) | ORAL | Status: DC
  Administered 2022-12-01 – 2022-12-13 (×24): 50 mg via ORAL
  Filled 2022-12-01 (×24): qty 1

## 2022-12-01 MED ORDER — ALUM & MAG HYDROXIDE-SIMETH 200-200-20 MG/5ML PO SUSP: 30.0000 mL | ORAL | Status: DC | PRN

## 2022-12-01 MED ORDER — METHOCARBAMOL 500 MG PO TABS
500.0000 mg | ORAL_TABLET | Freq: Four times a day (QID) | ORAL | Status: DC | PRN
  Administered 2022-12-02 – 2022-12-10 (×2): 500 mg via ORAL
  Filled 2022-12-01 (×2): qty 1

## 2022-12-01 MED ORDER — PROCHLORPERAZINE 25 MG RE SUPP: 12.5000 mg | Freq: Four times a day (QID) | RECTAL | Status: DC | PRN

## 2022-12-01 MED ORDER — PROCHLORPERAZINE MALEATE 5 MG PO TABS: 5.0000 mg | ORAL_TABLET | Freq: Four times a day (QID) | ORAL | Status: DC | PRN

## 2022-12-01 MED ORDER — SENNA 8.6 MG PO TABS
1.0000 | ORAL_TABLET | Freq: Every day | ORAL | Status: DC
  Administered 2022-12-02 – 2022-12-05 (×3): 8.6 mg via ORAL
  Filled 2022-12-01 (×4): qty 1

## 2022-12-01 MED ORDER — TRAZODONE HCL 50 MG PO TABS
25.0000 mg | ORAL_TABLET | Freq: Every evening | ORAL | Status: DC | PRN
  Filled 2022-12-01: qty 1

## 2022-12-01 MED ORDER — FLEET ENEMA 7-19 GM/118ML RE ENEM: 1.0000 | ENEMA | Freq: Once | RECTAL | Status: DC | PRN

## 2022-12-01 MED ORDER — CALCIUM CARBONATE ANTACID 500 MG PO CHEW
1.0000 | CHEWABLE_TABLET | Freq: Two times a day (BID) | ORAL | Status: DC | PRN
  Filled 2022-12-01: qty 1

## 2022-12-01 MED ORDER — IRBESARTAN 150 MG PO TABS
150.0000 mg | ORAL_TABLET | Freq: Every day | ORAL | Status: DC
  Administered 2022-12-02 – 2022-12-12 (×11): 150 mg via ORAL
  Filled 2022-12-01 (×11): qty 1

## 2022-12-01 MED ORDER — GUAIFENESIN-DM 100-10 MG/5ML PO SYRP: 5.0000 mL | ORAL_SOLUTION | Freq: Four times a day (QID) | ORAL | Status: DC | PRN

## 2022-12-01 MED ORDER — MAGNESIUM OXIDE -MG SUPPLEMENT 400 (240 MG) MG PO TABS
400.0000 mg | ORAL_TABLET | Freq: Every day | ORAL | Status: DC
  Administered 2022-12-03 – 2022-12-13 (×11): 400 mg via ORAL
  Filled 2022-12-01 (×11): qty 1

## 2022-12-01 MED ORDER — FERROUS SULFATE 325 (65 FE) MG PO TABS
325.0000 mg | ORAL_TABLET | Freq: Two times a day (BID) | ORAL | Status: DC
  Administered 2022-12-01 – 2022-12-13 (×24): 325 mg via ORAL
  Filled 2022-12-01 (×24): qty 1

## 2022-12-01 MED ORDER — FUROSEMIDE 10 MG/ML IJ SOLN
80.0000 mg | Freq: Two times a day (BID) | INTRAMUSCULAR | Status: DC
  Administered 2022-12-01 – 2022-12-02 (×2): 80 mg via INTRAVENOUS
  Filled 2022-12-01 (×2): qty 8

## 2022-12-01 MED ORDER — PROCHLORPERAZINE EDISYLATE 10 MG/2ML IJ SOLN: 5.0000 mg | Freq: Four times a day (QID) | INTRAMUSCULAR | Status: DC | PRN

## 2022-12-01 MED ORDER — SORBITOL 70 % SOLN: 30.0000 mL | Freq: Every day | Status: DC | PRN

## 2022-12-01 MED ORDER — MAGNESIUM OXIDE -MG SUPPLEMENT 400 (240 MG) MG PO TABS
400.0000 mg | ORAL_TABLET | Freq: Two times a day (BID) | ORAL | Status: AC
  Administered 2022-12-01 – 2022-12-02 (×2): 400 mg via ORAL
  Filled 2022-12-01 (×2): qty 1

## 2022-12-01 MED ORDER — EMPAGLIFLOZIN 10 MG PO TABS
10.0000 mg | ORAL_TABLET | Freq: Every day | ORAL | Status: DC
  Administered 2022-12-01: 10 mg via ORAL
  Filled 2022-12-01: qty 1

## 2022-12-01 MED ORDER — SPIRONOLACTONE 25 MG PO TABS
25.0000 mg | ORAL_TABLET | Freq: Every day | ORAL | Status: DC
  Administered 2022-12-02 – 2022-12-13 (×12): 25 mg via ORAL
  Filled 2022-12-01 (×12): qty 1

## 2022-12-01 MED ORDER — CALCIUM CARBONATE ANTACID 500 MG PO CHEW
1.0000 | CHEWABLE_TABLET | Freq: Two times a day (BID) | ORAL | Status: DC | PRN
  Administered 2022-12-01: 200 mg via ORAL
  Filled 2022-12-01: qty 1

## 2022-12-01 MED ORDER — PANTOPRAZOLE SODIUM 40 MG PO TBEC
80.0000 mg | DELAYED_RELEASE_TABLET | Freq: Every day | ORAL | Status: DC
  Administered 2022-12-02 – 2022-12-13 (×12): 80 mg via ORAL
  Filled 2022-12-01 (×13): qty 2

## 2022-12-01 MED ORDER — EMPAGLIFLOZIN 10 MG PO TABS
10.0000 mg | ORAL_TABLET | Freq: Every day | ORAL | Status: DC
  Administered 2022-12-02 – 2022-12-05 (×4): 10 mg via ORAL
  Filled 2022-12-01 (×4): qty 1

## 2022-12-01 MED ORDER — DOCUSATE SODIUM 100 MG PO CAPS
100.0000 mg | ORAL_CAPSULE | Freq: Two times a day (BID) | ORAL | Status: DC
  Administered 2022-12-01 – 2022-12-13 (×21): 100 mg via ORAL
  Filled 2022-12-01 (×23): qty 1

## 2022-12-01 MED ORDER — POTASSIUM CHLORIDE CRYS ER 20 MEQ PO TBCR
40.0000 meq | EXTENDED_RELEASE_TABLET | Freq: Every day | ORAL | Status: DC
  Administered 2022-12-02 – 2022-12-13 (×12): 40 meq via ORAL
  Filled 2022-12-01 (×12): qty 2

## 2022-12-01 MED ORDER — VITAMIN B-12 1000 MCG PO TABS
1000.0000 ug | ORAL_TABLET | Freq: Every day | ORAL | Status: DC
  Administered 2022-12-02 – 2022-12-13 (×12): 1000 ug via ORAL
  Filled 2022-12-01 (×12): qty 1

## 2022-12-01 MED ORDER — ACETAMINOPHEN 325 MG PO TABS
325.0000 mg | ORAL_TABLET | ORAL | Status: DC | PRN
  Administered 2022-12-02 – 2022-12-10 (×8): 650 mg via ORAL
  Filled 2022-12-01 (×8): qty 2

## 2022-12-01 NOTE — H&P (Addendum)
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  CT head significant for head bleed and transferred to [facility].  During transport, she was noted to experience increasing agitation and focal seizure activity.  She was given IV Versed and IV Keppra. She is maintained on Eliquis for permanent atrial fibrillation. This was reversed with Andexxa.  Repeat CT head scan showed normal evolution of cerebral blood collections of the subarachnoid and subdural spaces. No further seizure activity. Cardiology consulted for history of a. fib, heart failure and lower extremity edema. Eliquis on hold until cleared to resume by NS. AKI noted. Suspect that lower extremity edema is primarily driven by combination of lymphedema and chronic venous insufficiency. Discussed follow-up of OSA. Diuretics ongoing. No plan in notes for transitioning to oral Lasix. Tolerating diet. NS sees no need for repeat CT head. The patient requires inpatient medicine and rehabilitation evaluations and services for ongoing dysfunction secondary to ubdural hematoma/subarachnoid hemorrhage.   On exam, she is c/o dizziness when turning her head too quickly in either durectiuon. She also endorses longstanding presyncope on STS requiring her to stay still for 1+ minutes before moving. She lives alone in a 1 story home with 3 steps to b/b; no family available to assist her. Family aware she is unlikely to be independent level at discharge and is working to get her supervision either with home aides or a  facility.     Review of Systems  Constitutional:  Negative for chills and fever.  HENT:  Negative for sore throat and tinnitus.   Eyes:  Negative for blurred vision and double  vision.  Respiratory:  Negative for cough and sputum production.   Cardiovascular:  Negative for chest pain and palpitations.  Gastrointestinal:  Positive for constipation. Negative for nausea and vomiting.       Bowel habits typically 2 stools/day; endorses dysphagia with liquids  Genitourinary:  Negative for dysuria and urgency.  Musculoskeletal:  Positive for joint pain.  Neurological:  Positive for dizziness and headaches.  Psychiatric/Behavioral:  Negative for depression. The patient does not have insomnia.    Past Medical History:  Diagnosis Date   Anemia    Anxiety    Arthritis    Back pain    reason unknown   Cataract    bilateral immature   Diarrhea    Diverticulosis    GERD (gastroesophageal reflux disease)    takes Omeprazole daily   Headache(784.0)    occasionally   Hiatal hernia    History of blood transfusion    no abnormal reaction noted   History of bronchitis 2014   History of colon polyps    History of shingles    Hypertension    takes Metoprolol,Amlodipine and Losartan daily   Internal hemorrhoids    Joint pain    Joint swelling    Lung nodule    Nocturia    Numbness    hands occasionally   Osteoarthritis    Peripheral edema    takes HCTZ daily and Furosemide daily as needed   PONV (postoperative nausea and vomiting)    Redness    to both lower extremities   Shortness of breath    WITH EXERTION    Sleep apnea    doesn't use cpap  Urinary frequency    Urinary urgency    Venous insufficiency    Past Surgical History:  Procedure Laterality Date   ABDOMINAL HYSTERECTOMY  1983   ANKLE SURGERY Left 1993   ANTERIOR CRUCIATE LIGAMENT REPAIR Left 2013   BREAST SURGERY     LEFT SIDE BIOPSY 1989   CHOLECYSTECTOMY  1990   COLONOSCOPY     ENDOVENOUS ABLATION SAPHENOUS VEIN W/ LASER Right 04/17/2017   endovenous laser ablation R GSV by Tinnie Gens MD   ESOPHAGOGASTRODUODENOSCOPY     HAND SURGERY Left 2011   [place]  07/16/2012    Procedure: REPAIR QUADRICEP TENDON;  Surgeon: Rudean Haskell, MD;  Location: [facility];  Service: Orthopedics;  Laterality: Left;  open repair of VMO    quiadricp  10'21/2012   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   Procedure: TOTAL KNEE ARTHROPLASTY;  Surgeon: Rudean Haskell, MD;  Location: [place];  Service: Orthopedics;  Laterality: Left;   TOTAL KNEE ARTHROPLASTY Right 11/04/2013   DR Ronnie Derby   TOTAL KNEE ARTHROPLASTY Right 11/04/2013   Procedure: RIGHT TOTAL KNEE ARTHROPLASTY;  Surgeon: Vickey Huger, MD;  Location: [place];  Service: Orthopedics;  Laterality: Right;   TUBAL LIGATION  1974   vasculitic eruption of legs  1985   Family History  Problem Relation Age of Onset   Lymphoma Father    Leukemia Father    Heart attack Father    Hypertension Mother    Emphysema Mother    Heart disease Sister    Rashes / Skin problems Sister    Heart failure Sister    Kidney disease Sister    Heart disease Maternal Grandfather    Heart disease Paternal Grandmother    Heart disease Paternal Grandfather    Social History:  reports that she has never smoked. She has never used smokeless tobacco. She reports that she does not drink alcohol and does not use drugs. Allergies:  Allergies  Allergen Reactions   Sulfa Antibiotics     Reaction unknown   Adhesive [Tape] Other (See Comments) and Rash    Skin is very sensitive Skin is very sensitive Skin is very sensitive   Medications Prior to Admission  Medication Sig Dispense Refill   acetaminophen (TYLENOL) 500 MG tablet Take 500 mg by mouth every 6 (six) hours as needed for mild pain. For pain     albuterol (VENTOLIN HFA) 108 (90 Base) MCG/ACT inhaler Inhale 2 puffs into the lungs every 6 (six) hours as needed for wheezing or shortness of breath. 1 each 6   apixaban (ELIQUIS) 5 MG TABS tablet Take 1 tablet (5 mg total) by mouth 2 (two) times daily. 180 tablet 3   Biotin 5000 MCG CAPS Take 1 capsule by mouth daily.     Calcium Citrate-Vitamin D  500-12.5 MG-MCG PACK Take by oral route.     ferrous sulfate 325 (65 FE) MG tablet Take 325 mg by mouth 2 (two) times daily with a meal.     furosemide (LASIX) 20 MG tablet Take 2 tablets (40 mg total) by mouth daily. (May take an additional '20mg'$  as needed for weight gain greater than 270lb.) (Patient taking differently: Take 20 mg by mouth in the morning, at noon, and at bedtime. (May take an additional '20mg'$  as needed for weight gain greater than 270lb.)) 90 tablet 6   Glucosamine-Chondroitin (GLUCOSAMINE CHONDR COMPLEX PO) Take 1 capsule by mouth daily.     irbesartan (AVAPRO) 150 MG tablet Take 150  mg by mouth daily.     Magnesium 400 MG TABS Take 1 tablet by mouth 2 (two) times daily. For 4 days, then reduce to 400 mg daily 36 tablet 3   metoprolol tartrate (LOPRESSOR) 50 MG tablet TAKE 1 TABLET BY MOUTH TWICE A DAY 180 tablet 1   Omega-3 Fatty Acids (OMEGA-3 FISH OIL PO) Take 1 capsule by mouth daily.     omeprazole (PRILOSEC) 40 MG capsule Take 40 mg by mouth every morning.     potassium chloride SA (KLOR-CON M) 20 MEQ tablet Take 2 tablets (40 mEq total) by mouth daily. 60 tablet 3   triamcinolone lotion (KENALOG) 0.1 % Apply 1 application  topically 3 (three) times daily. For legs     vitamin B-12 (CYANOCOBALAMIN) 1000 MCG tablet Take 1,000 mcg by mouth daily.     vitamin E 180 MG (400 UNITS) capsule Take 400 Units by mouth daily.      Physical Exam Constitutional:      General: She is not in acute distress.    Appearance: She is obese.  HENT:     Head: Normocephalic.      Comments: eccymosis Eyes:     Extraocular Movements: Extraocular movements intact.     Pupils: Pupils are equal, round, and reactive to light.  Cardiovascular:     Rate and Rhythm: Normal rate and regular rhythm.  Pulmonary:     Effort: Pulmonary effort is normal.     Breath sounds: Normal breath sounds.  Abdominal:     General: Bowel sounds are normal.     Palpations: Abdomen is soft.  Genitourinary:     Comments: +Purwick, clear urine Musculoskeletal:     Right lower leg: Edema present.     Left lower leg: Edema present.  Skin:    Findings: Bruising present.          Comments: Lipid deposits BLE L>R knee warms, bruising, and edema. TTP thorughout joint line  Neurological:     Mental Status: She is alert and oriented to person, place, and time.     Motor: Weakness present.     Coordination: Coordination abnormal.     Comments:  DTRs: Reflexes were 2+ in bilateral Ues; negative in BL patella d/t TKR. Babinsky: flexor responses b/l.   Hoffmans: negative b/l Sensory exam: revealed normal sensation in all dermatomal regions in bilateral UE and LLE; RLE sensory deficit in distal plantar foot to mid-shin. Motor exam:  BL UE 4/5 grossly LLE 3/5 HF, 3/5 KE, 4/5 DF, 4/5 PF RLE 4/5 HF, 4/5 KE, 5/5 DF, 5/5 PF. Coordination: + Ataxia on FTN in bilateral UE, L>R Gait: Gait was normal. Able to toe and heel walk. Memory: recalls 2/3 objects at 5 minutes. +mild wordfinding difficulty.   Vertigo: No nystagmus on EOMI, + dizziness on bilateral lateral gaze. Negative HIT.  Psychiatric:        Mood and Affect: Mood normal.        Behavior: Behavior normal.      Home: Home Living Family/patient expects to be discharged to:: Private residence Living Arrangements: Alone Available Help at Discharge: Other (Comment) (Has 3 adult children and they live at least an hour away) Type of Home: House Home Access: Stairs to enter [employer] of Steps: 2 Entrance Stairs-Rails: Can reach both Home Layout: One level Bathroom Shower/Tub: Gaffer, Curtain Bathroom Toilet: Handicapped height Bathroom Accessibility: Yes Home Equipment: Cane - single point, Conservation officer, nature (2 wheels), Grab bars - tub/shower, Grab bars -  toilet   Functional History: Prior Function Prior Level of Function : Independent/Modified Independent Mobility Comments: Pt reports that recently she started to use a [facility]  for walking because she felt more secure with it.   Functional Status:  Mobility: Bed Mobility Overal bed mobility: Needs Assistance Bed Mobility: Supine to Sit, Sit to Supine Rolling: Min assist Sidelying to sit: Mod assist, HOB elevated Supine to sit: +2 for physical assistance, Mod assist, HOB elevated Sit to supine: +2 for physical assistance, Max assist General bed mobility comments: assist for all aspects of mobility, continual verbal cues for sequencing Transfers Overall transfer level: Needs assistance Equipment used: 2 person hand held assist Transfers: Sit to/from Stand Sit to Stand: +2 physical assistance, Max assist General transfer comment: STS x 2 from EOB, increased time to power up Ambulation/Gait General Gait Details: attempted sidestepping bedside. Pt unable to progress LLE for step. Demo poor motor planning Pre-gait activities: pt is able to step with left foot, very limited SLS on LLE to attempt step with RLE. Pt with very poor command following and sequencing to march in place or slide laterally at edge of bed   ADL: ADL Overall ADL's : Needs assistance/impaired Grooming: Wash/dry hands, Wash/dry face, Oral care, Minimal assistance, Cueing for sequencing, Sitting Upper Body Bathing: Minimal assistance, Sitting Lower Body Bathing: Total assistance, Bed level Upper Body Dressing : Minimal assistance, Sitting Lower Body Dressing: Total assistance, Bed level Toilet Transfer Details (indicate cue type and reason): Not assessed due to pt's fear of falling Toileting- Clothing Manipulation and Hygiene: Total assistance, Bed level   Cognition: Cognition Overall Cognitive Status: Impaired/Different from baseline Orientation Level: Oriented X4 (told me March, 2024 intermittently) Cognition Arousal/Alertness: Awake/alert Behavior During Therapy: WFL for tasks assessed/performed Overall Cognitive Status: Impaired/Different from baseline Area of Impairment:  Orientation, Attention, Memory, Following commands, Safety/judgement, Awareness, Problem solving Orientation Level: Disoriented to, Time (reports Feb 2023) Current Attention Level: Sustained Memory: Decreased short-term memory Following Commands: Follows one step commands inconsistently, Follows one step commands with increased time Safety/Judgement: Decreased awareness of safety, Decreased awareness of deficits Awareness: Intellectual Problem Solving: Difficulty sequencing, Requires verbal cues, Requires tactile cues   Physical Exam: Blood pressure 134/72, pulse 85, temperature 97.8 F (36.6 C), temperature source Oral, resp. rate 17, height '5\' 8"'$  (1.727 m), weight 123.4 kg, last menstrual period 02/07/2012, SpO2 97 %.   Results for orders placed or performed during the hospital encounter of 11/27/22 (from the past 48 hour(s))  TSH     Status: None   Collection Time: 12/01/22  4:59 AM  Result Value Ref Range   TSH 3.248 0.350 - 4.500 uIU/mL    Comment: Performed by a 3rd Generation assay with a functional sensitivity of <=0.01 uIU/mL. Performed at [facility], [place] [address] [place], [place] [address]    CT HEAD WO CONTRAST (5MM)  Result Date: 11/30/2022 CLINICAL DATA:  Intracranial hemorrhage EXAM: CT HEAD WITHOUT CONTRAST TECHNIQUE: Contiguous axial images were obtained from the base of the skull through the vertex without intravenous contrast. RADIATION DOSE REDUCTION: This exam was performed according to the departmental dose-optimization program which includes automated exposure control, adjustment of the mA and/or kV according to patient size and/or use of iterative reconstruction technique. COMPARISON:  CT head 1 day prior. FINDINGS: Brain: The hemorrhagic contusion in the left occipital lobe measuring proximally 1.2 cm in the axial plane is unchanged in size. Small volume subarachnoid hemorrhage in the surrounding sulci is also not significantly changed. The  subdural hematoma  overlying the occipital measuring up to 9 mm in maximal thickness in the axial plane is not significantly changed in size when remeasured using similar technique. Small volume blood layering along the left aspect of the falx (5-54) and over the parietal lobe more superiorly (5-53) is unchanged. There is mild edema in the left occipital lobe parenchyma without significant mass effect or midline shift. There is no new hemorrhage. There is no evidence of acute territorial infarct. The ventricles are stable in size. No intraventricular hemorrhage is seen. The pituitary and suprasellar region are normal. Vascular: There is calcification of the bilateral carotid siphons. Skull: Normal. Negative for fracture or focal lesion. Sinuses/Orbits: The paranasal sinuses are clear. The globes and orbits are unremarkable. IMPRESSION: Stable multicompartmental hemorrhage within and overlying the left parietal and occipital lobes as above with mild parenchymal edema but no significant mass effect or midline shift. Electronically Signed   By: Valetta Mole M.D.   On: 11/30/2022 12:55      Blood pressure 134/72, pulse 85, temperature 97.8 F (36.6 C), temperature source Oral, resp. rate 17, height '5\' 8"'$  (1.727 m), weight 123.4 kg, last menstrual period 02/07/2012, SpO2 97 %.  Medical Problem List and Plan: 1. 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  Antithrombotics: -DVT/anticoagulation:  Mechanical:  Antiembolism stockings, knee (TED hose) Bilateral lower extremities>>patient states she refuses compression stockings; can ACE wrap when OOB  -antiplatelet therapy: none  3. Pain Management: Tylenol as needed  4. Mood/Behavior/Sleep: LCSW to evaluate and provide emotional support  -antipsychotic agents: n/a  5. Neuropsych/cognition: This patient is capable of making decisions on her own behalf.  6. Skin/Wound Care:  Routine skin care checks   7. 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:   -fluid restriction <2 liters  -daily weight  -continue Jardiance 10 mg daily  -continue Lasix 80 mg daily  -continue spironolactone 25 mg daily  -continue irbesartan 150 mg daily  -continue magnesium oxide 400 mg BID  -continue KCl 10 mEq daily Filed Weights   12/01/22 1424  Weight: 123.4 kg     12: Chronic BLE edema/lymphedema:   -Unable to tolerate TED hose; may do well with ACE wrapping, d/w therapies for AM  - Would benefit from OP lymphedema therapy and potentially lymphedema pumps  13: Permanent atrial fibrillation:  -continue Lopressor 50 mg BID  -DOAC when cleared by NS  14: OSA; does not use CPAP  -sleep medicine follow-up as outpatient  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 Vertigo/Dizziness. Exam findings unrevealing  - Vestibular eval ordered - Meclizine 12.5 mg TID PRN  Barbie Banner, PA-C 12/01/2022  I have examined the patient independently and edited the note for HPI, ROS, exam, assessment, and plan as appropriate. I am in agreement with the above recommendations.   Gertie Gowda, DO 12/01/2022

## 2022-12-01 NOTE — Progress Notes (Signed)
Patient arrieved from 4 N at 41. Denies pain or discomfort at this time. Patient has ecchymosis on her face, bilateral arms and legs. ABD soft, bowel sounds active x 4. Bilateral lower extremity edema, +3. Patient states she still has some blurred vision. Daughter at bedside. Patient understands the use of the call light.

## 2022-12-01 NOTE — Discharge Summary (Signed)
Physician Discharge Summary  Patient ID: [name] MRN: [id number] DOB/AGE: November 29, 1940 82 y.o.  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 female who presented to the emergency department at [facility] on with reports of recent fall approximately 2 days prior to presentation. CT head significant for head bleed and transferred to [facility]. During transport, she was noted to experience increasing agitation and focal seizure activity. She was given IV Versed and IV Keppra. She is maintained on Eliquis for permanent atrial fibrillation. This was reversed with Andexxa. Repeat CT head scan showed normal evolution of cerebral blood collections of the subarachnoid and subdural spaces. No further seizure activity. Cardiology consulted for history of a. fib, heart failure and lower extremity edema. Eliquis on hold until cleared to resume by NS. AKI noted. Suspect that lower extremity edema is primarily driven by combination of lymphedema and chronic venous insufficiency. Discussed follow-up of OSA. Diuretics ongoing. No plan  in notes for transitioning to oral Lasix. Tolerating diet. NS sees no need for repeat CT head.    Hospital Course: [name] was admitted to rehab 12/01/2022 for inpatient therapies to consist of PT, ST and OT at least three hours five days a week. Past admission physiatrist, therapy team and rehab RN have worked together to provide customized collaborative inpatient rehab. Re-consulted cardiology in regards to Lasix dosing with rising creatinine. Reduced dose to 40 mg BID on 3/09. Serum creatinine increased to 1.5 and remained stable. Myrbetriq started for urinary urgency.  Decreased Mag-ox to 400 mg daily on 3/09. Lasix decreased to 40 mg daily on 3/11. UA showed E. Coli and treated with Keflex for seven days. Continued to monitor PVRs. Improved. Heart rate controlled on Lopressor. CBC normal on 3/18. Discussed with Dr. Christella Noa given significant risk of bleed and falls would not recommend resuming anticoagulation at this time. Can defer to outpatient. Myrbetriq is prohibitively expensive at discharge ($349/month). Will plan to discontinue now that UTI fully treated. Can follow up with her PCP and they might be able to start her on ditropan or get a prior authorization done for the Myrbetriq.   Blood pressures were monitored on TID basis and Jardiance 10 mg daily, Lasix 80 mg daily, spironolactone 25 mg daily, irbesartan 150 mg daily and magnesium oxide 400 mg BID continued. Jardiance discontinued on 3/12. Irbesartan decreased to 75 mg daily on 3/18 due to soft BP.  Rehab course: During patient's stay in rehab weekly team conferences were held to monitor patient's progress, set goals and discuss barriers to discharge. At admission, patient required mod assist with basic self-care skills and with  mobility.  She  has had improvement in activity tolerance, balance, postural control as well as ability to compensate for deficits. She has had improvement in functional use RUE/LUE  and RLE/LLE as well as  improvement in awareness  Discharge disposition: 01-Home or Self Care      Diet: Regular, thin liquids  Special Instructions: No driving, alcohol consumption or tobacco use.  Given significant risk of bleed and falls would not recommend resuming anticoagulation at this time per neurosurgery. Can defer to outpatient.   Recommend sleep medicine referral for possible OSA as outpatient.  30-35 minutes were spent on discharge planning and discharge summary.  Discharge Instructions     Discharge patient   Complete by: As directed    Discharge disposition: 01-Home or Self Care   Discharge patient date: 12/13/2022      Allergies as of 12/13/2022       Reactions   Sulfa Antibiotics    Reaction unknown   Adhesive [tape] Other (See Comments), Rash   Skin is very sensitive Skin is very sensitive Skin is very sensitive        Medication List     STOP taking these medications    apixaban 5 MG Tabs tablet Commonly known as: ELIQUIS   furosemide 20 MG tablet Commonly known as: LASIX   Magnesium 400 MG Tabs       TAKE these medications    acetaminophen 325 MG tablet Commonly known as: TYLENOL Take 1-2 tablets (325-650 mg total) by mouth every 4 (four) hours as needed for mild pain. What changed:  medication strength how much to take when to take this additional instructions   albuterol 108 (90 Base) MCG/ACT inhaler Commonly known as: Ventolin HFA Inhale 2 puffs into the lungs every 6 (six) hours as needed for wheezing or shortness of breath.   Biotin 5000 MCG Caps Take 1 capsule by mouth daily.   Calcium Citrate-Vitamin D 500-12.5 MG-MCG Pack Take by oral route.   cyanocobalamin 1000 MCG tablet Commonly known as: VITAMIN B12 Take 1,000 mcg by mouth daily.   ferrous sulfate 325 (65 FE) MG tablet Take 325 mg by mouth 2 (two) times daily with a meal.   GLUCOSAMINE CHONDR COMPLEX PO Take 1 capsule by mouth daily.   irbesartan 75 MG tablet Commonly  known as: AVAPRO Take 1 tablet (75 mg total) by mouth daily. What changed:  medication strength how much to take   magnesium oxide 400 MG tablet Commonly known as: MAG-OX Take 1 tablet (400 mg total) by mouth daily.   melatonin 3 MG Tabs tablet Take 1 tablet (3 mg total) by mouth at bedtime.   metoprolol tartrate 50 MG tablet Commonly known as: LOPRESSOR TAKE 1 TABLET BY MOUTH TWICE A DAY   OMEGA-3 FISH OIL PO Take 1 capsule by mouth daily.   omeprazole 40 MG capsule Commonly known as: PRILOSEC Take 40 mg by mouth every morning.   potassium chloride SA 20 MEQ tablet Commonly known as: KLOR-CON M Take 2 tablets (40 mEq total) by mouth daily.   spironolactone 25 MG tablet Commonly known as: ALDACTONE Take 1 tablet (25 mg total) by mouth daily.   torsemide 20 MG tablet Commonly known as: DEMADEX Take 2 tablets (40 mg total) by mouth daily.   triamcinolone lotion 0.1 % Commonly known as: KENALOG Apply 1 application  topically 3 (three) times daily. For legs   vitamin E 180 MG (400 UNITS) capsule Take 400 Units by mouth daily.  Follow-up Information     Cathie Olden, MD. Go to.   Specialty: Family Medicine Why: Hospital follow-up appointment. Referral for outpatient PT/OT, possible sleep medicine referral, follow-up urinary frequency/urgency. Contact information: [place] [address] [id number]         Durel Salts C, DO Follow up.   Specialty: Physical Medicine and Rehabilitation Why: office will call you to arrange your appt (sent) Contact information: [place] [address] [phone]         [facility] Follow up.   Why: Call the office in 1-2 days to make arrangments for hospital follow-up appointment. Contact information: [place] [phone] [place]  at [facility] Follow up.   Specialty: Sleep Medicine Why: Discuss referral with your primary care provider. Contact information: [place], [place] [id number] [place] [id number] [phone]        Arnoldo Lenis, MD Follow up on 01/17/2023.   Specialty: Cardiology Why: at 4pm for your cardiology follow up Contact information: [place] [address] [phone]                 Signed: Barbie Banner 12/13/2022, 10:19 AM

## 2022-12-01 NOTE — Progress Notes (Signed)
Inpatient Rehab Admissions Coordinator:   I have a bed for this patient to admit to CIR today.  Dr. Christella Noa in agreement as of last PM.  Will plan for admission today.  RN and TOC aware, I will touch base with family as well.   Shann Medal, PT, DPT Admissions Coordinator [phone] 12/01/22  9:51 AM

## 2022-12-01 NOTE — Progress Notes (Signed)
Inpatient Rehabilitation Admission Medication Review by a Pharmacist  A complete drug regimen review was completed for this patient to identify any potential clinically significant medication issues.  High Risk Drug Classes Is patient taking? Indication by Medication  Antipsychotic Yes Compazine- N/V  Anticoagulant No   Antibiotic No   Opioid No   Antiplatelet No   Hypoglycemics/insulin Yes Jardiance- HFpEF  Vasoactive Medication Yes Lopressor, Lasix, Avapro, Spironolactone- HFpEF  Chemotherapy No   Other Yes Protonix- GERD     Type of Medication Issue Identified Description of Issue Recommendation(s)  Drug Interaction(s) (clinically significant)     Duplicate Therapy     Allergy     No Medication Administration End Date     Incorrect Dose     Additional Drug Therapy Needed     Significant med changes from prior encounter (inform family/care partners about these prior to discharge).    Other  PTA meds: Albuterol inh Apixaban Omega-3 FA Prilosec Apixaban Restart PTA meds when and if necessary during CIR admission or at time of discharge, if warranted  Restart apixaban (PAF) when cleared by primary team per cardiology    Clinically significant medication issues were identified that warrant physician communication and completion of prescribed/recommended actions by midnight of the next day:  No  Time spent performing this drug regimen review (minutes):  30   [name] BS, PharmD, BCPS Clinical Pharmacist 12/01/2022 2:20 PM  Contact: [phone] after 3 PM  "Be curious, not judgmental..." -Jamal Maes

## 2022-12-01 NOTE — Progress Notes (Signed)
PMR Admission Coordinator Pre-Admission Assessment   Patient: [name] is an 82 y.o., female MRN: [id number] DOB: 11/19/1940 Height: '5\' 8"'$  (172.7 cm) Weight: 131.1 kg   Insurance Information HMO:     PPO:      PCP:      IPA:      80/20:      OTHER:  PRIMARY: Medicare A/B      Policy#: [id number]      Subscriber: pt CM Name:       Phone#:      Fax#:  Pre-Cert#: verified Civil engineer, contracting:  Benefits:  Phone #:      Name:  Eff. Date: A 08/26/05, B 02/24/14     Deduct: [id number]      Out of Pocket Max: n/a      Life Max: n/a CIR: 100%      SNF: 20 full days (20 days remaining) Outpatient: 80%     Co-Pay: 20% Home Health: 100%      Co-Pay:  DME: 80%     Co-Pay: 20% Providers:  SECONDARY: Engineer, production Supplement      Policy#: [id number]     Phone#:    Financial Counselor:       Phone#:    The "Data Collection Information Summary" for patients in Inpatient Rehabilitation Facilities with attached "Privacy Act [place] Records" was provided and verbally reviewed with: Patient and Family   Emergency Contact Information Contact Information       Name Relation Home Work [place] Daughter [phone]        Versie Starks Daughter [phone]   [phone]           Current Medical History  Patient Admitting Diagnosis: SDH/SAH    History of Present Illness: Pt is an 82 y/o female with PMH of anxiety, CHF, AF, lymphedema, venous insufficiency, tricuspid regurg, elevated pulmonary artery pressure, OA, and HTN admitted to [facility] on 11/27/22 following a week long history of increasing weakness, falls, N/V, and AMS.  In ED vitals WNL, labs relatively unremarkable except for BUN/Creatinine 25/1.30, and positive for benzodiazepines.  Pt with multiple ecchymotic areas on forehead, bilateral periorbital areas, and frontotemporal regions.  Bruises are of varying ages.  BLE edema 2/2 lymphedema.  Pt only oriented to person.  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  Neurosurgery recommended conservative management. Pt did develop an episode of sudden HA and N/V on 3/6.  Repeat head CTs stable.  Therapy ongoing and pt was recommended for CIR.    Complete NIHSS TOTAL: 7   [facility]'s [facility] record [facility] has been reviewed by the rehabilitation admission coordinator and physician.   Past Medical History      Past Medical History:  Diagnosis Date   Anemia     Anxiety     Arthritis     Back pain      reason unknown   Cataract      bilateral immature   Diarrhea     Diverticulosis     GERD (gastroesophageal reflux disease)      takes Omeprazole daily   Headache(784.0)      occasionally   Hiatal hernia     History of blood transfusion  no abnormal reaction noted   History of bronchitis 2014   History of colon polyps     History of shingles     Hypertension      takes Metoprolol,Amlodipine and Losartan daily   Internal hemorrhoids     Joint pain     Joint swelling     Lung nodule     Nocturia     Numbness      hands occasionally   Osteoarthritis     Peripheral edema      takes HCTZ daily and Furosemide daily as needed   PONV (postoperative nausea and vomiting)     Redness      to both lower extremities   Shortness of breath      WITH EXERTION    Sleep apnea      doesn't use cpap   Urinary frequency     Urinary urgency     Venous insufficiency        Has the patient had major surgery during 100 days prior to admission? No   Family History   family history includes Emphysema in her mother; Heart attack in her father; Heart disease in her maternal grandfather, paternal grandfather, paternal grandmother, and sister; Heart failure in her sister; Hypertension in her mother; Kidney disease in her sister; Leukemia in her father;  Lymphoma in her father; Rashes / Skin problems in her sister.   Current Medications   Current Facility-Administered Medications:    0.9 %  sodium chloride infusion, , Intravenous, Continuous, Ashok Pall, MD, Last Rate: 50 mL/hr at 12/01/22 0900, Infusion Verify at 12/01/22 0900   acetaminophen (TYLENOL) tablet 650 mg, 650 mg, Oral, Q6H PRN, Ashok Pall, MD, 650 mg at 12/01/22 0527   albuterol (PROVENTIL) (2.5 MG/3ML) 0.083% nebulizer solution 3 mL, 3 mL, Inhalation, Q6H PRN, Ashok Pall, MD   calcium carbonate (TUMS - dosed in mg elemental calcium) chewable tablet 200 mg of elemental calcium, 1 tablet, Oral, BID PRN, Meyran, Ocie Cornfield, NP, 200 mg of elemental calcium at 12/01/22 0445   Chlorhexidine Gluconate Cloth 2 % PADS 6 each, 6 each, Topical, Daily, Ashok Pall, MD, 6 each at 12/01/22 0920   cyanocobalamin (VITAMIN B12) tablet 1,000 mcg, 1,000 mcg, Oral, Daily, Ashok Pall, MD, 1,000 mcg at 12/01/22 0919   docusate sodium (COLACE) capsule 100 mg, 100 mg, Oral, BID, Ashok Pall, MD, 100 mg at 12/01/22 0919   empagliflozin (JARDIANCE) tablet 10 mg, 10 mg, Oral, Daily, Chandrasekhar, Mahesh A, MD, 10 mg at 12/01/22 0919   ferrous sulfate tablet 325 mg, 325 mg, Oral, BID WC, Ashok Pall, MD, 325 mg at 12/01/22 0920   furosemide (LASIX) injection 80 mg, 80 mg, Intravenous, BID, Chandrasekhar, Mahesh A, MD, 80 mg at 12/01/22 0918   irbesartan (AVAPRO) tablet 150 mg, 150 mg, Oral, Daily, Ashok Pall, MD, 150 mg at 12/01/22 0920   magnesium oxide (MAG-OX) tablet 400 mg, 400 mg, Oral, BID, Ashok Pall, MD, 400 mg at 12/01/22 0919   metoprolol tartrate (LOPRESSOR) tablet 50 mg, 50 mg, Oral, BID, Lily Kocher, PA-C, 50 mg at 12/01/22 0919   ondansetron (ZOFRAN) injection 4 mg, 4 mg, Intravenous, Q6H PRN, Ashok Pall, MD   Oral care mouth rinse, 15 mL, Mouth Rinse, PRN, Ashok Pall, MD   pantoprazole (PROTONIX) EC tablet 80 mg, 80 mg, Oral, Daily, Cabbell, Marylyn Ishihara, MD, 80 mg  at 12/01/22 0919   potassium chloride SA (KLOR-CON M) CR tablet 40 mEq, 40 mEq, Oral, Daily, Ashok Pall, MD,  40 mEq at 12/01/22 0919   senna (SENOKOT) tablet 8.6 mg, 1 tablet, Oral, Daily, Ashok Pall, MD, 8.6 mg at 12/01/22 [id number]   spironolactone (ALDACTONE) tablet 25 mg, 25 mg, Oral, Daily, Chandrasekhar, Mahesh A, MD, 25 mg at 12/01/22 0919   Patients Current Diet:  Diet Order                  Diet regular Room service appropriate? Yes; Fluid consistency: Thin  Diet effective now                         Precautions / Restrictions Precautions Precautions: Fall Restrictions Weight Bearing Restrictions: No    Has the patient had 2 or more falls or a fall with injury in the past year? Yes   Prior Activity Level Limited Community (1-2x/wk): indep prior to admit, no DME, not driving   Prior Functional Level Self Care: Did the patient need help bathing, dressing, using the toilet or eating? Independent   Indoor Mobility: Did the patient need assistance with walking from room to room (with or without device)? Independent   Stairs: Did the patient need assistance with internal or external stairs (with or without device)? Independent   Functional Cognition: Did the patient need help planning regular tasks such as shopping or remembering to take medications? Independent   Patient Information Are you of Hispanic, Latino/a,or Spanish origin?: A. No, not of Hispanic, Latino/a, or Spanish origin What is your race?: A. White Do you need or want an interpreter to communicate with a doctor or health care staff?: 0. No   Patient's Response To:  Health Literacy and Transportation Is the patient able to respond to health literacy and transportation needs?: Yes Health Literacy - How often do you need to have someone help you when you read instructions, pamphlets, or other written material from your doctor or pharmacy?: Never In the past 12 months, has lack of transportation kept you  from medical appointments or from getting medications?: No In the past 12 months, has lack of transportation kept you from meetings, work, or from getting things needed for daily living?: No   Home Assistive Devices / [place]: Cane - single point, Conservation officer, nature (2 wheels), Grab bars - tub/shower, Grab bars - toilet   Prior Device Use: Indicate devices/aids used by the patient prior to current illness, exacerbation or injury? None of the above   Current Functional Level Cognition   Overall Cognitive Status: Impaired/Different from baseline Current Attention Level: Sustained Orientation Level: Oriented X4 (told me March, 2024 intermittently) Following Commands: Follows one step commands inconsistently, Follows one step commands with increased time Safety/Judgement: Decreased awareness of safety, Decreased awareness of deficits    Extremity Assessment (includes Sensation/Coordination)   Upper Extremity Assessment: Overall WFL for tasks assessed  Lower Extremity Assessment: Defer to PT evaluation     ADLs   Overall ADL's : Needs assistance/impaired Grooming: Wash/dry hands, Wash/dry face, Oral care, Minimal assistance, Cueing for sequencing, Sitting Upper Body Bathing: Minimal assistance, Sitting Lower Body Bathing: Total assistance, Bed level Upper Body Dressing : Minimal assistance, Sitting Lower Body Dressing: Total assistance, Bed level Toilet Transfer Details (indicate cue type and reason): Not assessed due to pt's fear of falling Toileting- Clothing Manipulation and Hygiene: Total assistance, Bed level     Mobility   Overal bed mobility: Needs Assistance Bed Mobility: Supine to Sit, Sit to Supine Rolling: Min assist Sidelying to sit: Mod assist, HOB elevated  Supine to sit: +2 for physical assistance, Mod assist, HOB elevated Sit to supine: +2 for physical assistance, Max assist General bed mobility comments: assist for all aspects of mobility, continual  verbal cues for sequencing     Transfers   Overall transfer level: Needs assistance Equipment used: 2 person hand held assist Transfers: Sit to/from Stand Sit to Stand: +2 physical assistance, Max assist General transfer comment: STS x 2 from EOB, increased time to power up     Ambulation / Gait / Stairs / Wheelchair Mobility   Ambulation/Gait General Gait Details: attempted sidestepping bedside. Pt unable to progress LLE for step. Demo poor motor planning Pre-gait activities: pt is able to step with left foot, very limited SLS on LLE to attempt step with RLE. Pt with very poor command following and sequencing to march in place or slide laterally at edge of bed     Posture / Balance Dynamic Sitting Balance Sitting balance - Comments: Pt able to maintain sitting balance intermittently when initially transitioning to seated on EOB. Once therapist assisted pt with obtaining a stable base of support, she was able to participate in MMT and coordination assessment. Balance Overall balance assessment: Needs assistance Sitting-balance support: Feet supported, Single extremity supported Sitting balance-Leahy Scale: Fair Sitting balance - Comments: Pt able to maintain sitting balance intermittently when initially transitioning to seated on EOB. Once therapist assisted pt with obtaining a stable base of support, she was able to participate in MMT and coordination assessment. Standing balance support: Bilateral upper extremity supported Standing balance-Leahy Scale: Poor Standing balance comment: static stand bedside 1 minute x 2 trials. +2 min assist to maintain static balance     Special needs/care consideration Skin significant bruising throughout, lymphedema on BLEs    Previous Home Environment (from acute therapy documentation) Living Arrangements: Alone Available Help at Discharge: Other (Comment) (Has 3 adult children and they live at least an hour away) Type of Home: House Home Layout: One  level Home Access: Stairs to enter Entrance Stairs-Rails: Can reach both Entrance Stairs-Number of Steps: 2 Bathroom Shower/Tub: Gaffer, Architectural technologist: Handicapped height Bathroom Accessibility: Yes How Accessible: Accessible via walker   Discharge Living Setting Discharge Home Layout: Multi-level Alternate Level Stairs-Rails: Can reach both Alternate Level Stairs-Number of Steps: 3 Discharge Home Access: Stairs to enter Entrance Stairs-Rails: None (door frame) Entrance Stairs-Number of Steps: 1 Discharge Bathroom Shower/Tub: Walk-in shower Discharge Bathroom Toilet: Handicapped height Discharge Bathroom Accessibility: Yes How Accessible: Accessible via walker   Social/Family/Support Systems Anticipated Caregiver: would either need hired caregivers vs transition to SNF pending progress, main contact is Camera operator Information: Diannia Ruder [phone] (she communicates with her siblings reg pt's POC) Ability/Limitations of Caregiver: family not available to assist due to working schedules Caregiver Availability: Other (Comment) (see above) Discharge Plan Discussed with Primary Caregiver: Yes Is Caregiver In Agreement with Plan?: Yes Does Caregiver/Family have Issues with Lodging/Transportation while Pt is in Rehab?: No   Goals Patient/Family Goal for Rehab: PT/OT supervision to min assist, SLP min to mod assist Expected length of stay: 16-18 days Additional Information: discharge plan: family to work on hiring 24/7 caregivers vs transition to SNF (pt does not want SNF placement if possible) Pt/Family Agrees to Admission and willing to participate: Yes Program Orientation Provided & Reviewed with Pt/Caregiver Including Roles  & Responsibilities: Yes Additional Information Needs: 20 SNF days remaining for Medicare  Barriers to Discharge: Decreased caregiver support, Inaccessible home environment   Decrease burden of Care  through IP  rehab admission: n/a   Possible need for SNF placement upon discharge: Potentially.  Family aware pt will need 24/7 supervision assist at discharge.  They are looking into hired caregivers vs transition to SNF.    Patient Condition: I have reviewed medical records from [facility], spoken with CM, and patient and daughter. I met with patient at the bedside for inpatient rehabilitation assessment.  Patient will benefit from ongoing PT, OT, and SLP, can actively participate in 3 hours of therapy a day 5 days of the week, and can make measurable gains during the admission.  Patient will also benefit from the coordinated team approach during an Inpatient Acute Rehabilitation admission.  The patient will receive intensive therapy as well as Rehabilitation physician, nursing, social worker, and care management interventions.  Due to bladder management, bowel management, safety, skin/wound care, disease management, medication administration, pain management, and patient education the patient requires 24 hour a day rehabilitation nursing.  The patient is currently max assist with mobility and basic ADLs.  Discharge setting and therapy post discharge at skilled nursing facility is anticipated.  Patient has agreed to participate in the Acute Inpatient Rehabilitation Program and will admit today.   Preadmission Screen Completed By:  Michel Santee, PT, DPT  12/01/2022 10:10 AM ______________________________________________________________________   Discussed status with Dr. Tressa Busman on 12/01/22  at 10:10 AM  and received approval for admission today.   Admission Coordinator:  Michel Santee, PT, DPT time 10:10 AM Sudie Grumbling 12/01/22     Assessment/Plan: Diagnosis: Does the need for close, 24 hr/day Medical supervision in concert with the patient's rehab needs make it unreasonable for this patient to be served in a less intensive setting? Yes Co-Morbidities requiring supervision/potential complications: Recurrent falls  with SAH/SDH, HFpEF, HTN, A fib with AC held d/t hemorrhage, lymphedema, pulmonary hypertension, vertigo/dizzibess, nausea, urinary incontinence and AKI Due to bladder management, bowel management, safety, skin/wound care, disease management, medication administration, and patient education, does the patient require 24 hr/day rehab nursing? Yes Does the patient require coordinated care of a physician, rehab nurse, PT, OT, and SLP to address physical and functional deficits in the context of the above medical diagnosis(es)? Yes Addressing deficits in the following areas: balance, endurance, locomotion, strength, transferring, bowel/bladder control, bathing, dressing, feeding, grooming, toileting, and cognition Can the patient actively participate in an intensive therapy program of at least 3 hrs of therapy 5 days a week? Yes The potential for patient to make measurable gains while on inpatient rehab is good Anticipated functional outcomes upon discharge from inpatient rehab: min assist PT, min assist OT, mod assist SLP Estimated rehab length of stay to reach the above functional goals is: 16-18 days Anticipated discharge destination: Home with 24/7 hired help vs. SNF 10. Overall Rehab/Functional Prognosis: good     MD Signature:   Gertie Gowda, DO 12/01/2022

## 2022-12-01 NOTE — H&P (Incomplete)
Physical Medicine and Rehabilitation Admission H&P   CC: Functional deficits secondary to subdural hematoma/subarachnoid hemorrhage  HPI: [name] is an 82 year old female who presented to the emergency department at [facility] on with reports of recent fall approximately 2 days prior to presentation.  CT head significant for head bleed and transferred to [facility].  During transport, she was noted to experience increasing agitation and focal seizure activity.  She was given IV Versed and IV Keppra. She is maintained on Eliquis for permanent atrial fibrillation. This was reversed with Andexxa.  Repeat CT head scan showed normal evolution of cerebral blood collections of the subarachnoid and subdural spaces. No further seizure activity. Cardiology consulted for history of a. fib, heart failure and lower extremity edema. Eliquis on hold until cleared to resume by NS. AKI noted. Suspect that lower extremity edema is primarily driven by combination of lymphedema and chronic venous insufficiency. Discussed follow-up of OSA. Diuretics ongoing. Tolerating diet. NS sees no need for repeat CT head. The patient requires inpatient medicine and rehabilitation evaluations and services for ongoing dysfunction secondary to subdural hematoma/subarachnoid hemorrhage.    Patient indicates desire for full code status.  Review of Systems  Constitutional:  Negative for chills and fever.  HENT:  Negative for sore throat and tinnitus.   Eyes:  Negative for blurred vision and double vision.  Respiratory:  Negative for cough and sputum production.   Cardiovascular:  Negative for chest pain and palpitations.  Gastrointestinal:  Positive for constipation. Negative for nausea and vomiting.       Bowel habits typically 2 stools/day; endorses dysphagia with liquids  Genitourinary:  Negative for dysuria and urgency.  Musculoskeletal:  Positive for joint pain.  Neurological:  Positive for dizziness and  headaches.  Psychiatric/Behavioral:  Negative for depression. The patient does not have insomnia.    Past Medical History:  Diagnosis Date   Anemia    Anxiety    Arthritis    Back pain    reason unknown   Cataract    bilateral immature   Diarrhea    Diverticulosis    GERD (gastroesophageal reflux disease)    takes Omeprazole daily   Headache(784.0)    occasionally   Hiatal hernia    History of blood transfusion    no abnormal reaction noted   History of bronchitis 2014   History of colon polyps    History of shingles    Hypertension    takes Metoprolol,Amlodipine and Losartan daily   Internal hemorrhoids    Joint pain    Joint swelling    Lung nodule    Nocturia    Numbness    hands occasionally   Osteoarthritis    Peripheral edema    takes HCTZ daily and Furosemide daily as needed   PONV (postoperative nausea and vomiting)    Redness    to both lower extremities   Shortness of breath    WITH EXERTION    Sleep apnea    doesn't use cpap   Urinary frequency    Urinary urgency    Venous insufficiency    Past Surgical History:  Procedure Laterality Date   ABDOMINAL HYSTERECTOMY  1983   ANKLE SURGERY Left 1993   ANTERIOR CRUCIATE LIGAMENT REPAIR Left 2013   BREAST SURGERY     LEFT SIDE BIOPSY 1989   CHOLECYSTECTOMY  1990   COLONOSCOPY     ENDOVENOUS ABLATION SAPHENOUS VEIN W/ LASER Right 04/17/2017   endovenous laser ablation R  GSV by Tinnie Gens MD   ESOPHAGOGASTRODUODENOSCOPY     HAND SURGERY Left 2011   QUADRICEPS TENDON REPAIR  07/16/2012   Procedure: REPAIR QUADRICEP TENDON;  Surgeon: Rudean Haskell, MD;  Location: [place];  Service: Orthopedics;  Laterality: Left;  open repair of VMO    quiadricp  10'21/2012   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   Procedure: TOTAL KNEE ARTHROPLASTY;  Surgeon: Rudean Haskell, MD;  Location: [place];  Service: Orthopedics;  Laterality: Left;   TOTAL KNEE ARTHROPLASTY Right 11/04/2013   DR Ronnie Derby   TOTAL KNEE ARTHROPLASTY  Right 11/04/2013   Procedure: RIGHT TOTAL KNEE ARTHROPLASTY;  Surgeon: Vickey Huger, MD;  Location: [place];  Service: Orthopedics;  Laterality: Right;   TUBAL LIGATION  1974   vasculitic eruption of legs  1985   Family History  Problem Relation Age of Onset   Lymphoma Father    Leukemia Father    Heart attack Father    Hypertension Mother    Emphysema Mother    Heart disease Sister    Rashes / Skin problems Sister    Heart failure Sister    Kidney disease Sister    Heart disease Maternal Grandfather    Heart disease Paternal Grandmother    Heart disease Paternal Grandfather    Social History:  reports that she has never smoked. She has never used smokeless tobacco. She reports that she does not drink alcohol and does not use drugs. Allergies:  Allergies  Allergen Reactions   Sulfa Antibiotics     Reaction unknown   Adhesive [Tape] Other (See Comments) and Rash    Skin is very sensitive Skin is very sensitive Skin is very sensitive   Medications Prior to Admission  Medication Sig Dispense Refill   acetaminophen (TYLENOL) 500 MG tablet Take 500 mg by mouth every 6 (six) hours as needed for mild pain. For pain     albuterol (VENTOLIN HFA) 108 (90 Base) MCG/ACT inhaler Inhale 2 puffs into the lungs every 6 (six) hours as needed for wheezing or shortness of breath. 1 each 6   apixaban (ELIQUIS) 5 MG TABS tablet Take 1 tablet (5 mg total) by mouth 2 (two) times daily. 180 tablet 3   Biotin 5000 MCG CAPS Take 1 capsule by mouth daily.     Calcium Citrate-Vitamin D 500-12.5 MG-MCG PACK Take by oral route.     ferrous sulfate 325 (65 FE) MG tablet Take 325 mg by mouth 2 (two) times daily with a meal.     furosemide (LASIX) 20 MG tablet Take 2 tablets (40 mg total) by mouth daily. (May take an additional '20mg'$  as needed for weight gain greater than 270lb.) (Patient taking differently: Take 20 mg by mouth in the morning, at noon, and at bedtime. (May take an additional '20mg'$  as needed for  weight gain greater than 270lb.)) 90 tablet 6   Glucosamine-Chondroitin (GLUCOSAMINE CHONDR COMPLEX PO) Take 1 capsule by mouth daily.     irbesartan (AVAPRO) 150 MG tablet Take 150 mg by mouth daily.     Magnesium 400 MG TABS Take 1 tablet by mouth 2 (two) times daily. For 4 days, then reduce to 400 mg daily 36 tablet 3   Omega-3 Fatty Acids (OMEGA-3 FISH OIL PO) Take 1 capsule by mouth daily.     omeprazole (PRILOSEC) 40 MG capsule Take 40 mg by mouth every morning.     potassium chloride SA (KLOR-CON M) 20 MEQ tablet Take 2 tablets (40  mEq total) by mouth daily. 60 tablet 3   triamcinolone lotion (KENALOG) 0.1 % Apply 1 application  topically 3 (three) times daily. For legs     vitamin B-12 (CYANOCOBALAMIN) 1000 MCG tablet Take 1,000 mcg by mouth daily.     vitamin E 180 MG (400 UNITS) capsule Take 400 Units by mouth daily.      Physical Exam Constitutional:      General: She is not in acute distress.    Appearance: She is obese.  HENT:     Head: Normocephalic.      Comments: eccymosis Eyes:     Extraocular Movements: Extraocular movements intact.     Pupils: Pupils are equal, round, and reactive to light.  Cardiovascular:     Rate and Rhythm: Normal rate and regular rhythm.  Pulmonary:     Effort: Pulmonary effort is normal.     Breath sounds: Normal breath sounds.  Abdominal:     General: Bowel sounds are normal.     Palpations: Abdomen is soft.  Musculoskeletal:     Right lower leg: Edema present.     Left lower leg: Edema present.  Skin:    Comments: Lipid deposits BLE  Neurological:     General: No focal deficit present.     Mental Status: She is alert and oriented to person, place, and time.  Psychiatric:        Mood and Affect: Mood normal.        Behavior: Behavior normal.      Home: Home Living Family/patient expects to be discharged to:: Private residence Living Arrangements: Alone Available Help at Discharge: Other (Comment) (Has 3 adult children and  they live at least an hour away) Type of Home: House Home Access: Stairs to enter [employer] of Steps: 2 Entrance Stairs-Rails: Can reach both Home Layout: One level Bathroom Shower/Tub: Gaffer, Architectural technologist: Handicapped height Bathroom Accessibility: Yes Home Equipment: [place] - single point, Conservation officer, nature (2 wheels), Grab bars - tub/shower, Grab bars - toilet   Functional History: Prior Function Prior Level of Function : Independent/Modified Independent Mobility Comments: Pt reports that recently she started to use a SPC for walking because she felt more secure with it.  Functional Status:  Mobility: Bed Mobility Overal bed mobility: Needs Assistance Bed Mobility: Supine to Sit, Sit to Supine Rolling: Min assist Sidelying to sit: Mod assist, HOB elevated Supine to sit: +2 for physical assistance, Mod assist, HOB elevated Sit to supine: +2 for physical assistance, Max assist General bed mobility comments: assist for all aspects of mobility, continual verbal cues for sequencing Transfers Overall transfer level: Needs assistance Equipment used: 2 person hand held assist Transfers: Sit to/from Stand Sit to Stand: +2 physical assistance, Max assist General transfer comment: STS x 2 from EOB, increased time to power up Ambulation/Gait General Gait Details: attempted sidestepping bedside. Pt unable to progress LLE for step. Demo poor motor planning Pre-gait activities: pt is able to step with left foot, very limited SLS on LLE to attempt step with RLE. Pt with very poor command following and sequencing to march in place or slide laterally at edge of bed    ADL: ADL Overall ADL's : Needs assistance/impaired Grooming: Wash/dry hands, Wash/dry face, Oral care, Minimal assistance, Cueing for sequencing, Sitting Upper Body Bathing: Minimal assistance, Sitting Lower Body Bathing: Total assistance, Bed level Upper Body Dressing : Minimal assistance,  Sitting Lower Body Dressing: Total assistance, Bed level Toilet Transfer Details (indicate cue type and  reason): Not assessed due to pt's fear of falling Toileting- Clothing Manipulation and Hygiene: Total assistance, Bed level  Cognition: Cognition Overall Cognitive Status: Impaired/Different from baseline Orientation Level: Oriented X4 (told me March, 2024 intermittently) Cognition Arousal/Alertness: Awake/alert Behavior During Therapy: WFL for tasks assessed/performed Overall Cognitive Status: Impaired/Different from baseline Area of Impairment: Orientation, Attention, Memory, Following commands, Safety/judgement, Awareness, Problem solving Orientation Level: Disoriented to, Time (reports Feb 2023) Current Attention Level: Sustained Memory: Decreased short-term memory Following Commands: Follows one step commands inconsistently, Follows one step commands with increased time Safety/Judgement: Decreased awareness of safety, Decreased awareness of deficits Awareness: Intellectual Problem Solving: Difficulty sequencing, Requires verbal cues, Requires tactile cues  Physical Exam: Blood pressure 133/66, pulse 82, temperature 98.2 F (36.8 C), temperature source Oral, resp. rate (!) 22, height '5\' 8"'$  (1.727 m), weight 131.1 kg, last menstrual period 02/07/2012, SpO2 94 %.   Results for orders placed or performed during the hospital encounter of 11/27/22 (from the past 48 hour(s))  TSH     Status: None   Collection Time: 12/01/22  4:59 AM  Result Value Ref Range   TSH 3.248 0.350 - 4.500 uIU/mL    Comment: Performed by a 3rd Generation assay with a functional sensitivity of <=0.01 uIU/mL. Performed at [facility], [place] [address] [place], [place] [address]    CT HEAD WO CONTRAST (5MM)  Result Date: 11/30/2022 CLINICAL DATA:  Intracranial hemorrhage EXAM: CT HEAD WITHOUT CONTRAST TECHNIQUE: Contiguous axial images were obtained from the base of the skull through the vertex  without intravenous contrast. RADIATION DOSE REDUCTION: This exam was performed according to the departmental dose-optimization program which includes automated exposure control, adjustment of the mA and/or kV according to patient size and/or use of iterative reconstruction technique. COMPARISON:  CT head 1 day prior. FINDINGS: Brain: The hemorrhagic contusion in the left occipital lobe measuring proximally 1.2 cm in the axial plane is unchanged in size. Small volume subarachnoid hemorrhage in the surrounding sulci is also not significantly changed. The subdural hematoma overlying the occipital measuring up to 9 mm in maximal thickness in the axial plane is not significantly changed in size when remeasured using similar technique. Small volume blood layering along the left aspect of the falx (5-54) and over the parietal lobe more superiorly (5-53) is unchanged. There is mild edema in the left occipital lobe parenchyma without significant mass effect or midline shift. There is no new hemorrhage. There is no evidence of acute territorial infarct. The ventricles are stable in size. No intraventricular hemorrhage is seen. The pituitary and suprasellar region are normal. Vascular: There is calcification of the bilateral carotid siphons. Skull: Normal. Negative for fracture or focal lesion. Sinuses/Orbits: The paranasal sinuses are clear. The globes and orbits are unremarkable. IMPRESSION: Stable multicompartmental hemorrhage within and overlying the left parietal and occipital lobes as above with mild parenchymal edema but no significant mass effect or midline shift. Electronically Signed   By: Valetta Mole M.D.   On: 11/30/2022 12:55      Blood pressure 133/66, pulse 82, temperature 98.2 F (36.8 C), temperature source Oral, resp. rate (!) 22, height '5\' 8"'$  (1.727 m), weight 131.1 kg, last menstrual period 02/07/2012, SpO2 94 %.  Medical Problem List and Plan: 1. Functional deficits secondary to ***  -patient  may *** shower  -ELOS/Goals: ***  2.  Antithrombotics: -DVT/anticoagulation:  Mechanical:  Antiembolism stockings, knee (TED hose) Bilateral lower extremities>>patient states she refuses compression stockings  -antiplatelet therapy: none  3. Pain  Management: Tylenol as needed  4. Mood/Behavior/Sleep: LCSW to evaluate and provide emotional support  -antipsychotic agents: n/a  5. Neuropsych/cognition: This patient *** capable of making decisions on *** own behalf.  6. Skin/Wound Care: Routine skin care checks   7. 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 baseline   ??CKD  -follow-up BMP  ***  Barbie Banner, PA-C 12/01/2022

## 2022-12-01 NOTE — Progress Notes (Signed)
Progress Note  Patient Name: [name] Date of Encounter: 12/01/2022  Primary Cardiologist: Carlyle Dolly, MD   Subjective   Overnight has improved her diet. Continues to diurese.  Patient notes her legs have never looked this small.  Inpatient Medications    Scheduled Meds:  Chlorhexidine Gluconate Cloth  6 each Topical Daily   cyanocobalamin  1,000 mcg Oral Daily   docusate sodium  100 mg Oral BID   empagliflozin  10 mg Oral Daily   ferrous sulfate  325 mg Oral BID WC   furosemide  80 mg Intravenous BID   irbesartan  150 mg Oral Daily   magnesium oxide  400 mg Oral BID   metoprolol tartrate  50 mg Oral BID   pantoprazole  80 mg Oral Daily   potassium chloride SA  40 mEq Oral Daily   senna  1 tablet Oral Daily   spironolactone  25 mg Oral Daily   Continuous Infusions:  sodium chloride 10 mL/hr at 12/01/22 1039   PRN Meds: acetaminophen, albuterol, calcium carbonate, ondansetron (ZOFRAN) IV, mouth rinse   Vital Signs    Vitals:   12/01/22 0700 12/01/22 0800 12/01/22 0900 12/01/22 1000  BP: (!) 117/59 116/64 110/61 133/66  Pulse: 81 79 82 82  Resp: 19 (!) 21 20 (!) 22  Temp:  98.2 F (36.8 C)    TempSrc:  Oral    SpO2: 93% 94% 94% 94%  Weight:      Height:        Intake/Output Summary (Last 24 hours) at 12/01/2022 1052 Last data filed at 12/01/2022 1046 Gross per 24 hour  Intake 1228.32 ml  Output 4100 ml  Net -2871.68 ml   Filed Weights   11/27/22 2326  Weight: 131.1 kg    Telemetry    A fib with rare PVCs - Personally Reviewed  ECG    No new - Personally Reviewed  Physical Exam   Gen: No distress, Morbid Obesity   Neck: Thick neck Cardiac: No Rubs or Gallops, systolic murmur, IRIR rhythm; no RV heave Respiratory: Clear to auscultation bilaterally, normal effort, normal respiratory rate GI: Soft, nontender, non-distended  MS: bilateral, painless edema;  moves all extremities Integument: chronic venous changes Psych: Normal affect,  patient feels well   Labs    Chemistry Recent Labs  Lab 11/27/22 2322 11/29/22 0829  NA 137 139  K 4.1 4.6  CL 102 103  CO2 25 27  GLUCOSE 130* 97  BUN 25* 23  CREATININE 1.30* 1.27*  CALCIUM 9.5 9.4  PROT 7.5  --   ALBUMIN 3.9  --   AST 24  --   ALT 15  --   ALKPHOS 81  --   BILITOT 3.9*  --   GFRNONAA 41* 42*  ANIONGAP 10 9     Hematology Recent Labs  Lab 11/27/22 2322 11/29/22 0829  WBC 7.0 7.0  RBC 4.23 3.83*  HGB 13.3 12.1  HCT 41.7 37.3  MCV 98.6 97.4  MCH 31.4 31.6  MCHC 31.9 32.4  RDW 15.5 15.7*  PLT 131* 123*    Cardiac EnzymesNo results for input(s): "TROPONINI" in the last 168 hours. No results for input(s): "TROPIPOC" in the last 168 hours.   BNP Recent Labs  Lab 11/28/22 1544  BNP 310.4*     DDimer No results for input(s): "DDIMER" in the last 168 hours.   Radiology    CT HEAD WO CONTRAST (5MM)  Result Date: 11/30/2022 CLINICAL DATA:  Intracranial  hemorrhage EXAM: CT HEAD WITHOUT CONTRAST TECHNIQUE: Contiguous axial images were obtained from the base of the skull through the vertex without intravenous contrast. RADIATION DOSE REDUCTION: This exam was performed according to the departmental dose-optimization program which includes automated exposure control, adjustment of the mA and/or kV according to patient size and/or use of iterative reconstruction technique. COMPARISON:  CT head 1 day prior. FINDINGS: Brain: The hemorrhagic contusion in the left occipital lobe measuring proximally 1.2 cm in the axial plane is unchanged in size. Small volume subarachnoid hemorrhage in the surrounding sulci is also not significantly changed. The subdural hematoma overlying the occipital measuring up to 9 mm in maximal thickness in the axial plane is not significantly changed in size when remeasured using similar technique. Small volume blood layering along the left aspect of the falx (5-54) and over the parietal lobe more superiorly (5-53) is unchanged. There  is mild edema in the left occipital lobe parenchyma without significant mass effect or midline shift. There is no new hemorrhage. There is no evidence of acute territorial infarct. The ventricles are stable in size. No intraventricular hemorrhage is seen. The pituitary and suprasellar region are normal. Vascular: There is calcification of the bilateral carotid siphons. Skull: Normal. Negative for fracture or focal lesion. Sinuses/Orbits: The paranasal sinuses are clear. The globes and orbits are unremarkable. IMPRESSION: Stable multicompartmental hemorrhage within and overlying the left parietal and occipital lobes as above with mild parenchymal edema but no significant mass effect or midline shift. Electronically Signed   By: Valetta Mole M.D.   On: 11/30/2022 12:55     Patient Profile     82 y.o. female hx of HFpEF, HTN, Permanent AF, OSA with worsening LE edema in the setting of [place]  Assessment & Plan     Pulmonary Hypertension HFpEF (acute on chronic) Tricuspid regurgitation - WHO Functional Class unclear but asymptomatic at rest, Stage C, hypervolemic, WHO II & III in the setting of Morbid Obesity, HFpEF, Untreated OSA - SGLT2i added today - Diuretic regimen: lasix 80 IV BID Diuretics aldactone 25 mg Po daily; BMP in PM - Fluid restriction of < 2 L  - No signs or symptoms of connective tissue disease and no family history of the CT or PH  - planned for conservative inpatient diagnostics; defer High res CT, PFTs, Hepatic Duplex as outpatient; defer RHC for now; will need outpatient f/u with sleep medicine; would be reasonable outpatient GLP-1 therapy candidate - Normal TSH  HTN - MRA &  ARB  Permanent AF complicated by [facility] - Continue BB for now - DOAC when cleared by primary    For questions or updates, please contact [facility] Please consult [email or web address] for contact info under Cardiology/STEMI.      Rudean Haskell, MD [address], [address] [place], [place] [address] [phone]  10:52 AM

## 2022-12-02 DIAGNOSIS — I609 Nontraumatic subarachnoid hemorrhage, unspecified: Secondary | ICD-10-CM | POA: Diagnosis not present

## 2022-12-02 LAB — CBC WITH DIFFERENTIAL/PLATELET
Abs Immature Granulocytes: 0.02 10*3/uL (ref 0.00–0.07)
Basophils Absolute: 0.1 10*3/uL (ref 0.0–0.1)
Basophils Relative: 1 %
Eosinophils Absolute: 0.3 10*3/uL (ref 0.0–0.5)
Eosinophils Relative: 5 %
HCT: 36.7 % (ref 36.0–46.0)
Hemoglobin: 12.2 g/dL (ref 12.0–15.0)
Immature Granulocytes: 0 %
Lymphocytes Relative: 16 %
Lymphs Abs: 1 10*3/uL (ref 0.7–4.0)
MCH: 31.9 pg (ref 26.0–34.0)
MCHC: 33.2 g/dL (ref 30.0–36.0)
MCV: 95.8 fL (ref 80.0–100.0)
Monocytes Absolute: 0.9 10*3/uL (ref 0.1–1.0)
Monocytes Relative: 14 %
Neutro Abs: 4 10*3/uL (ref 1.7–7.7)
Neutrophils Relative %: 64 %
Platelets: 126 10*3/uL — ABNORMAL LOW (ref 150–400)
RBC: 3.83 MIL/uL — ABNORMAL LOW (ref 3.87–5.11)
RDW: 15.4 % (ref 11.5–15.5)
WBC: 6.3 10*3/uL (ref 4.0–10.5)
nRBC: 0 % (ref 0.0–0.2)

## 2022-12-02 LAB — COMPREHENSIVE METABOLIC PANEL
ALT: 12 U/L (ref 0–44)
AST: 18 U/L (ref 15–41)
Albumin: 2.8 g/dL — ABNORMAL LOW (ref 3.5–5.0)
Alkaline Phosphatase: 63 U/L (ref 38–126)
Anion gap: 13 (ref 5–15)
BUN: 25 mg/dL — ABNORMAL HIGH (ref 8–23)
CO2: 27 mmol/L (ref 22–32)
Calcium: 9.3 mg/dL (ref 8.9–10.3)
Chloride: 99 mmol/L (ref 98–111)
Creatinine, Ser: 1.49 mg/dL — ABNORMAL HIGH (ref 0.44–1.00)
GFR, Estimated: 35 mL/min — ABNORMAL LOW (ref >60)
Glucose, Bld: 93 mg/dL (ref 70–99)
Potassium: 3.7 mmol/L (ref 3.5–5.1)
Sodium: 139 mmol/L (ref 135–145)
Total Bilirubin: 4.1 mg/dL — ABNORMAL HIGH (ref 0.3–1.2)
Total Protein: 6 g/dL — ABNORMAL LOW (ref 6.5–8.1)

## 2022-12-02 LAB — MAGNESIUM: Magnesium: 1.8 mg/dL (ref 1.7–2.4)

## 2022-12-02 MED ORDER — FUROSEMIDE 40 MG PO TABS
40.0000 mg | ORAL_TABLET | Freq: Two times a day (BID) | ORAL | Status: DC
  Administered 2022-12-03: 40 mg via ORAL
  Filled 2022-12-02: qty 1

## 2022-12-02 NOTE — Plan of Care (Signed)
  Problem: RH Problem Solving Goal: LTG Patient will demonstrate problem solving for (SLP) Description: LTG:  Patient will demonstrate problem solving for basic/complex daily situations with cues  (SLP) Flowsheets (Taken 12/02/2022 1750) LTG: Patient will demonstrate problem solving for (SLP): Complex daily situations LTG Patient will demonstrate problem solving for:  Supervision  Minimal Assistance - Patient > 75%   Problem: RH Memory Goal: LTG Patient will use memory compensatory aids to (SLP) Description: LTG:  Patient will use memory compensatory aids to recall biographical/new, daily complex information with cues (SLP) Flowsheets (Taken 12/02/2022 1750) LTG: Patient will use memory compensatory aids to (SLP): Supervision   Problem: RH Attention Goal: LTG Patient will demonstrate this level of attention during functional activites (SLP) Description: LTG:  Patient will will demonstrate this level of attention during functional activites (SLP) Flowsheets (Taken 12/02/2022 1750) Patient will demonstrate during cognitive/linguistic activities the attention type of: Selective LTG: Patient will demonstrate this level of attention during cognitive/linguistic activities with assistance of (SLP): Supervision   Problem: RH Awareness Goal: LTG: Patient will demonstrate awareness during functional activites type of (SLP) Description: LTG: Patient will demonstrate awareness during functional activites type of (SLP) Flowsheets (Taken 12/02/2022 1750) Patient will demonstrate during cognitive/linguistic activities awareness type of: Anticipatory LTG: Patient will demonstrate awareness during cognitive/linguistic activities with assistance of (SLP): Supervision

## 2022-12-02 NOTE — Progress Notes (Signed)
Inpatient Rehabilitation  Patient information reviewed and entered into eRehab system by Trea Latner M. Lively Haberman, M.A., CCC/SLP, PPS Coordinator.  Information including medical coding, functional ability and quality indicators will be reviewed and updated through discharge.    

## 2022-12-02 NOTE — Discharge Instructions (Addendum)
Inpatient Rehab Discharge Instructions  [name] Discharge date and time:  12/13/2022  Activities/Precautions/ Functional Status: Activity: no lifting, driving, or strenuous exercise until cleared by MD Diet: regular diet; restrict fluids to less than 2 liters/day Wound Care: none needed Functional status:  ___ No restrictions     ___ Walk up steps independently _x__ 24/7 supervision/assistance   ___ Walk up steps with assistance ___ Intermittent supervision/assistance  ___ Bathe/dress independently ___ Walk with walker     ___ Bathe/dress with assistance ___ Walk Independently    ___ Shower independently ___ Walk with assistance    _x__ Shower with assistance _x__ No alcohol     ___ Return to work/school ________  Special Instructions: No driving, alcohol consumption or tobacco use.  Weight yourself daily on the same scale and record. Brings this with you to your doctors' office visit. It will help them adjust your medications if necessary.  Recommend daily BP measurement in same arm and record time of day. Bring this information with you to follow-up appointments.   COMMUNITY REFERRALS UPON DISCHARGE:      Outpatient: PT     OT    ST               *Please expect to schedule outpatient therapies with primary care physician for physical, occupational, and speech therapies. Limited clinicals have been faxed. If their clinic has any questions, they are welcome to follow-up.*   GENERAL COMMUNITY RESOURCES FOR PATIENT/FAMILY: Reminder PCP appt on 3/25 at 1:30pm with Dr. Carlis Abbott.   My questions have been answered and I understand these instructions. I will adhere to these goals and the provided educational materials after my discharge from the hospital.  Patient/Caregiver Signature _______________________________ Date __________  Clinician Signature _______________________________________ Date __________  Please bring this form and your medication list with you to all your  follow-up doctor's appointments.

## 2022-12-02 NOTE — Progress Notes (Signed)
Inpatient Rehabilitation Care Coordinator Assessment and Plan Patient Details  Name: [name] MRN: [id number] Date of Birth: 1941/02/09  Today's Date: 12/02/2022  Hospital Problems: Principal Problem:   Subarachnoid hemorrhage [facility])  Past Medical History:  Past Medical History:  Diagnosis Date   Anemia    Anxiety    Arthritis    Back pain    reason unknown   Cataract    bilateral immature   Diarrhea    Diverticulosis    GERD (gastroesophageal reflux disease)    takes Omeprazole daily   Headache(784.0)    occasionally   Hiatal hernia    History of blood transfusion    no abnormal reaction noted   History of bronchitis 2014   History of colon polyps    History of shingles    Hypertension    takes Metoprolol,Amlodipine and Losartan daily   Internal hemorrhoids    Joint pain    Joint swelling    Lung nodule    Nocturia    Numbness    hands occasionally   Osteoarthritis    Peripheral edema    takes HCTZ daily and Furosemide daily as needed   PONV (postoperative nausea and vomiting)    Redness    to both lower extremities   Shortness of breath    WITH EXERTION    Sleep apnea    doesn't use cpap   Urinary frequency    Urinary urgency    Venous insufficiency    Past Surgical History:  Past Surgical History:  Procedure Laterality Date   ABDOMINAL HYSTERECTOMY  1983   ANKLE SURGERY Left 1993   ANTERIOR CRUCIATE LIGAMENT REPAIR Left 2013   BREAST SURGERY     LEFT SIDE BIOPSY 1989   CHOLECYSTECTOMY  1990   COLONOSCOPY     ENDOVENOUS ABLATION SAPHENOUS VEIN W/ LASER Right 04/17/2017   endovenous laser ablation R GSV by Tinnie Gens MD   ESOPHAGOGASTRODUODENOSCOPY     HAND SURGERY Left 2011   [place]  07/16/2012   Procedure: REPAIR QUADRICEP TENDON;  Surgeon: Rudean Haskell, MD;  Location: [facility];  Service: Orthopedics;  Laterality: Left;  open repair of VMO    quiadricp  10'21/2012   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   Procedure: TOTAL  KNEE ARTHROPLASTY;  Surgeon: Rudean Haskell, MD;  Location: [facility];  Service: Orthopedics;  Laterality: Left;   TOTAL KNEE ARTHROPLASTY Right 11/04/2013   DR Ronnie Derby   TOTAL KNEE ARTHROPLASTY Right 11/04/2013   Procedure: RIGHT TOTAL KNEE ARTHROPLASTY;  Surgeon: Vickey Huger, MD;  Location: [facility];  Service: Orthopedics;  Laterality: Right;   TUBAL LIGATION  1974   vasculitic eruption of legs  1985   Social History:  reports that she has never smoked. She has never used smokeless tobacco. She reports that she does not drink alcohol and does not use drugs.  Family / Support Systems Marital Status: Single Patient Roles: Parent Spouse/Significant Other: Report s/o of 32 yrs passed 3 yeas ago due to cancer. Children: 3 chlidren- [name] (lives in [place]), [place] and [place]. Other Supports: none reported Anticipated Caregiver: TBD Ability/Limitations of Caregiver: Per EMR, pt to discharge to home with hired caregivers and/or SNF placement pending progress. 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 Education: [place] - How often do you need to have someone help  you when you read instructions, pamphlets, or other written material from your doctor or pharmacy?: Never Writes: Yes Employment Status: Retired Date Retired/Disabled/Unemployed: 2015 Age Retired: 80 Public relations account executive Issues: Denies Guardian/Conservator: N/A   Abuse/Neglect Abuse/Neglect Assessment Can Be Completed: Yes Physical Abuse: Denies Verbal Abuse: Denies Sexual Abuse: Denies Exploitation of patient/patient's resources: Denies Self-Neglect: Denies  Patient response to: Social Isolation - How often do you feel lonely or isolated from those around you?: Never  Emotional Status Pt's affect, behavior and adjustment status:  Pt in good spirits at time of visit. Pt adjusting to current changes, especially with her current memory defecits, and the idea that she may no longer be able to live alone. Recent Psychosocial Issues: See above Psychiatric History: Pt denies. Per EMR, hx of anxiety. Substance Abuse History: Denies tobacco product use and rec druguse. Admits to being a social drinker years ago but not longer indulges.  Patient / Family Perceptions, Expectations & Goals Pt/Family understanding of illness & functional limitations: Pt and family appear to have a general understanding of care needs Premorbid pt/family roles/activities: Independent Anticipated changes in roles/activities/participation: Assistance with ADLs/IADLs Pt/family expectations/goals: "motor skills and thinking."  [employer]: None Premorbid Home Care/DME Agencies: None Transportation available at discharge: TBD Is the patient able to respond to transportation needs?: Yes In the past 12 months, has lack of transportation kept you from medical appointments or from getting medications?: No In the past 12 months, has lack of transportation kept you from meetings, work, or from getting things needed for daily living?: No Resource referrals recommended: Neuropsychology  Discharge Planning Living Arrangements: Alone Support Systems: Children Type of Residence: Private residence Insurance Resources: [employer] Financial Resources: [place] Referred: No Living Expenses: Own Money Management: Patient Does the patient have any problems obtaining your medications?: No Home Management: Pt managed all home care needs Patient/Family Preliminary Plans: TBD Care Coordinator Barriers to Discharge: Decreased caregiver support, Lack of/limited family support Care Coordinator Anticipated Follow Up Needs: HH/OP  Clinical Impression  SW met with pt in room to introduce self, explain role, and discuss  discharge process. Pt is not a English as a second language teacher. No HCPOA. DME: cane and RW (recently started using).   1315-SW made contact with [name] but she indicated she would call SW back due to poor reception as she was in a store.   Terrion Poblano A Celester Morgan 12/02/2022, 1:45 PM

## 2022-12-02 NOTE — Progress Notes (Addendum)
PROGRESS NOTE   Subjective/Complaints:  No acute complaints. No events overnight. Sating >94% on RA, weights down, no orthostatic s/s or dizziness yet this AM.   ROS: Denies fevers, chills, N/V, abdominal pain, constipation, diarrhea, SOB, cough, chest pain, new weakness or paraesthesias.    Objective:   CT HEAD WO CONTRAST (5MM)  Result Date: 11/30/2022 CLINICAL DATA:  Intracranial hemorrhage EXAM: CT HEAD WITHOUT CONTRAST TECHNIQUE: Contiguous axial images were obtained from the base of the skull through the vertex without intravenous contrast. RADIATION DOSE REDUCTION: This exam was performed according to the departmental dose-optimization program which includes automated exposure control, adjustment of the mA and/or kV according to patient size and/or use of iterative reconstruction technique. COMPARISON:  CT head 1 day prior. FINDINGS: Brain: The hemorrhagic contusion in the left occipital lobe measuring proximally 1.2 cm in the axial plane is unchanged in size. Small volume subarachnoid hemorrhage in the surrounding sulci is also not significantly changed. The subdural hematoma overlying the occipital measuring up to 9 mm in maximal thickness in the axial plane is not significantly changed in size when remeasured using similar technique. Small volume blood layering along the left aspect of the falx (5-54) and over the parietal lobe more superiorly (5-53) is unchanged. There is mild edema in the left occipital lobe parenchyma without significant mass effect or midline shift. There is no new hemorrhage. There is no evidence of acute territorial infarct. The ventricles are stable in size. No intraventricular hemorrhage is seen. The pituitary and suprasellar region are normal. Vascular: There is calcification of the bilateral carotid siphons. Skull: Normal. Negative for fracture or focal lesion. Sinuses/Orbits: The paranasal sinuses are clear.  The globes and orbits are unremarkable. IMPRESSION: Stable multicompartmental hemorrhage within and overlying the left parietal and occipital lobes as above with mild parenchymal edema but no significant mass effect or midline shift. Electronically Signed   By: Valetta Mole M.D.   On: 11/30/2022 12:55   Recent Labs    12/02/22 0708  WBC 6.3  HGB 12.2  HCT 36.7  PLT 126*   Recent Labs    12/02/22 0708  NA 139  K 3.7  CL 99  CO2 27  GLUCOSE 93  BUN 25*  CREATININE 1.49*  CALCIUM 9.3    Intake/Output Summary (Last 24 hours) at 12/02/2022 1154 Last data filed at 12/02/2022 0852 Gross per 24 hour  Intake 310 ml  Output 775 ml  Net -465 ml        Physical Exam: Vital Signs Blood pressure 135/61, pulse 94, temperature 98.2 F (36.8 C), resp. rate 16, height '5\' 8"'$  (1.727 m), weight 121.6 kg, last menstrual period 02/07/2012, SpO2 94 %. Constitutional: No apparent distress. Appropriate appearance for age. +Obese HENT: No JVD. Neck Supple. Trachea midline. +Multiple bruising on b/l face Eyes: PERRLA. EOMI. Visual fields grossly intact.  Cardiovascular: RRR, +murmur. + Bilateral lymphedema. Capillary refill brisk.  Respiratory: CTAB. No rales, rhonchi, or wheezing. On RA.  Abdomen: + bowel sounds, normoactive. No distention or tenderness.  Skin: Multiple bruising on bilateral cheeks, arm, and knees.  Chronic venous stasis changed BL LE. Raised polyps on L shin and medial ankle.  New L medial thigh erythema, non-tender, no warmth or fluctuance. Marked with pen 3/9.   MSK:      No apparent deformity.      Strength: BL UE 4/5 grossly LLE 3/5 HF, 3/5 KE, 4/5 DF, 4/5 PF RLE 4/5 HF, 4/5 KE, 5/5 DF, 5/5 PF.  Neurologic exam:  DTRs: Reflexes were 2+ in bilateral Ues; negative in BL patella d/t TKR. Babinsky: flexor responses b/l.   Hoffmans: negative b/l Sensory exam: revealed normal sensation in all dermatomal regions in bilateral UE and LLE; RLE sensory deficit in distal plantar  foot to mid-shin. Coordination: + Ataxia on FTN in bilateral UE, L>R Memory: recalls 2/3 objects at 5 minutes. +mild wordfinding difficulty.      Assessment/Plan: 1. Functional deficits which require 3+ hours per day of interdisciplinary therapy in a comprehensive inpatient rehab setting. Physiatrist is providing close team supervision and 24 hour management of active medical problems listed below. Physiatrist and rehab team continue to assess barriers to discharge/monitor patient progress toward functional and medical goals  Care Tool:  Bathing              Bathing assist       Upper Body Dressing/Undressing Upper body dressing        Upper body assist      Lower Body Dressing/Undressing Lower body dressing            Lower body assist       Toileting Toileting    Toileting assist       Transfers Chair/bed transfer  Transfers assist           Locomotion Ambulation   Ambulation assist              Walk 10 feet activity   Assist           Walk 50 feet activity   Assist           Walk 150 feet activity   Assist           Walk 10 feet on uneven surface  activity   Assist           Wheelchair     Assist               Wheelchair 50 feet with 2 turns activity    Assist            Wheelchair 150 feet activity     Assist          Blood pressure 135/61, pulse 94, temperature 98.2 F (36.8 C), resp. rate 16, height '5\' 8"'$  (1.727 m), weight 121.6 kg, last menstrual period 02/07/2012, SpO2 94 %. Medical Problem List and Plan: 1. 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  Antithrombotics: -DVT/anticoagulation:  Mechanical:  Antiembolism stockings, knee (TED hose) Bilateral lower extremities>>patient states she refuses compression stockings; can ACE wrap when OOB  -  Was on Eliquis 5 mg BID PTA, Will need to clear resumption with Dr. Christella Noa for discharge vs. OP with repeat imaging             - antiplatelet therapy: none   3. Pain Management: Tylenol as needed   4. Mood/Behavior/Sleep: LCSW to evaluate and provide emotional support             -  antipsychotic agents: n/a   5. Neuropsych/cognition: This patient is capable of making decisions on her own behalf.   6. Skin/Wound Care: Routine skin care checks   - 3/9: Area of nontender redness marked on L inner thigh as above; if borders expand or develop s/s concerning for cellulitis, would start with IV abx for 2-3 days given poor tissue penetration with PVD/lymphedema, then switch to PO.    7. Fluids/Electrolytes/Nutrition: Strict Is and Os and follow-up chemistries             -fluid restriction <2 liters             -continue KCl             -continue B12   - admission labs mag, k stable   - Decreasing PO mag from 800 mg BID to Daily 3/9; repeat on Monday   8: Hypertension: monitor TID and prn      - normotensive, monitor closely for s/s orthostasis      12/02/2022    5:11 AM 12/02/2022    5:00 AM 12/01/2022    7:44 PM  Vitals with BMI  Weight  268 lbs 1 oz   BMI  99991111   Systolic [id number]  A999333  Diastolic 61  85  Pulse 94  100      9: GERD: continue Protonix   10: Iron deficiency:continue ferrous sulfate 325 mg BID   11: 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:              -fluid restriction <2 liters             -daily weight (dry weight 270 lbs per OP meds)             -continue Jardiance 10 mg daily             -continue Lasix 80 mg bid - currently IV -> 3/8 per HF team DC PM dose and transition to 40 mg BID 3/9 AM; they will round on her Saturday with further recs             -continue spironolactone 25 mg daily             -continue irbesartan 150 mg daily             -continue magnesium oxide 400 mg BID -> daily 3/9             -continue KCl 10 mEq daily Filed  Weights   12/01/22 1424 12/02/22 0500  Weight: 123.4 kg 121.6 kg   - weights downtrending, converting to PO lasix as above    12: Chronic BLE edema/lymphedema:              -Unable to tolerate TED hose; may do well with ACE wrapping, d/w therapies for AM             - Would benefit from OP lymphedema therapy and potentially lymphedema pumps   13: Permanent atrial fibrillation:             -continue Lopressor 50 mg BID             -DOAC when cleared by NS   14: OSA; does not use CPAP             -sleep medicine follow-up as outpatient   15: CKD stage 3a, baseline Cr appears 1.0-1.2              -follow-up BMP   -  2/8: Admission labs elevated Cr 1.49; touching base with cardiology regarding wean from IV lasix, encourage PO fluids with restriction <2L daily.    16. Vertigo/Dizziness. Exam findings unrevealing  - Vestibular eval ordered - pending - Meclizine 12.5 mg TID PRN     LOS: 1 days A FACE TO FACE EVALUATION WAS PERFORMED  Gertie Gowda 12/02/2022, 11:54 AM

## 2022-12-02 NOTE — Progress Notes (Signed)
Occupational Therapy TBI Note  Patient Details  Name: [name] MRN: [id number] Date of Birth: [birth year]  {CHL IP REHAB OT TIME CALCULATIONS:[id number]}   Short Term Goals: Week 1:  OT Short Term Goal 1 (Week 1): Pt will stand to groom at sink to demo improved activity tolerance OT Short Term Goal 2 (Week 1): Pt will complete SPT wiht LRAD and MIN A OT Short Term Goal 3 (Week 1): Pt will complete 2/3 steps of toileting with MIN A for balance OT Short Term Goal 4 (Week 1): Pt will be A&O x 4 with compensatory strategies PRN  Skilled Therapeutic Interventions/Progress Updates:  Pt received *** for skilled OT session with focus on ***. Pt agreeable to interventions, demonstrating overall *** mood. Pt reported ***/10 pain, stating "***" in reference to ***. OT offering intermediate rest breaks and positioning suggestions throughout session to address pain/fatigue and maximize participation/safety in session.    Pt remained *** with all immediate needs met at end of session. Pt continues to be appropriate for skilled OT intervention to promote further functional independence.   Therapy Documentation Precautions:  Precautions Precautions: Fall Restrictions Weight Bearing Restrictions: No  Agitated Behavior Scale: TBI       Therapy/Group: Individual Therapy  Maudie Mercury, OTR/L, MSOT  12/02/2022, 7:30 PM

## 2022-12-02 NOTE — Evaluation (Signed)
Speech Language Pathology Assessment and Plan  Patient Details  Name: [name] MRN: [id number] Date of Birth: January 10, 1941  SLP Diagnosis: Cognitive Impairments;Speech and Language deficits  Rehab Potential: Good ELOS: 2-2.5 weeks   Today's Date: 12/02/2022 SLP Individual Time: 1100-1200 SLP Individual Time Calculation (min): 60 min  Hospital Problem: Principal Problem:   Subarachnoid hemorrhage ([place])  Past Medical History:  Past Medical History:  Diagnosis Date   Anemia    Anxiety    Arthritis    Back pain    reason unknown   Cataract    bilateral immature   Diarrhea    Diverticulosis    GERD (gastroesophageal reflux disease)    takes Omeprazole daily   Headache(784.0)    occasionally   Hiatal hernia    History of blood transfusion    no abnormal reaction noted   History of bronchitis 2014   History of colon polyps    History of shingles    Hypertension    takes Metoprolol,Amlodipine and Losartan daily   Internal hemorrhoids    Joint pain    Joint swelling    Lung nodule    Nocturia    Numbness    hands occasionally   Osteoarthritis    Peripheral edema    takes HCTZ daily and Furosemide daily as needed   PONV (postoperative nausea and vomiting)    Redness    to both lower extremities   Shortness of breath    WITH EXERTION    Sleep apnea    doesn't use cpap   Urinary frequency    Urinary urgency    Venous insufficiency    Past Surgical History:  Past Surgical History:  Procedure Laterality Date   ABDOMINAL HYSTERECTOMY  1983   ANKLE SURGERY Left 1993   ANTERIOR CRUCIATE LIGAMENT REPAIR Left 2013   BREAST SURGERY     LEFT SIDE BIOPSY 1989   CHOLECYSTECTOMY  1990   COLONOSCOPY     ENDOVENOUS ABLATION SAPHENOUS VEIN W/ LASER Right 04/17/2017   endovenous laser ablation R [facility] by Tinnie Gens MD   ESOPHAGOGASTRODUODENOSCOPY     HAND SURGERY Left 2011   [place]  07/16/2012   Procedure: REPAIR QUADRICEP TENDON;  Surgeon: Rudean Haskell, MD;  Location: [facility];  Service: Orthopedics;  Laterality: Left;  open repair of VMO    quiadricp  10'21/2012   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   Procedure: TOTAL KNEE ARTHROPLASTY;  Surgeon: Rudean Haskell, MD;  Location: [facility];  Service: Orthopedics;  Laterality: Left;   TOTAL KNEE ARTHROPLASTY Right 11/04/2013   DR Ronnie Derby   TOTAL KNEE ARTHROPLASTY Right 11/04/2013   Procedure: RIGHT TOTAL KNEE ARTHROPLASTY;  Surgeon: Vickey Huger, MD;  Location: [facility];  Service: Orthopedics;  Laterality: Right;   TUBAL LIGATION  1974   vasculitic eruption of legs  1985    Assessment / Plan / Recommendation Clinical Impression  [name] is an 82 year old female who presented to the emergency department at [facility] on with reports of recent fall approximately 2 days prior to presentation.  CT head significant for head bleed and transferred to [facility].  During transport, she was noted to experience increasing agitation and focal seizure activity. Repeat CT head scan showed normal evolution of cerebral blood collections of the subarachnoid and subdural spaces. No further seizure activity. The patient requires inpatient medicine and rehabilitation evaluations and services for ongoing dysfunction secondary to subdural hematoma/subarachnoid hemorrhage. She lives alone in  a 1 story home with 3 steps to b/b; no family available to assist her. Family aware she is unlikely to be independent level at discharge and is working to get her supervision either with home aides or a  facility.   SLP consulted to complete cognitive-linguistic evaluation in the setting of recent subdural hematoma/subarachnoid hemorrhage s/p fall. Pt presents with moderate cognitive-linguistic deficits in the domains of memory, attention, problem solving, and executive functions.In addition to other informal assessment measures, SLP administered the SLUMS to assess cognitive-linguistic skills. Pt achieved a score of 13/30  (n = 27). Pt demonstrated awareness of cognitive and physical impairments and recognized she will need support upon discharge, however, hopeful for a great recovery. Pt exhibited word finding difficulty at the conversational level suggestive of anomia. Suspect this also attributed to attention and/or working memory deficits. Comprehension appeared grossly intact. It appears pt has made improvements with comprehension/expression and awareness per chart review. Speech was clear and without evidence of dysarthria. Patient would benefit from skilled ST intervention to address cognitive-linguistic deficits in order to maximize functional independence and decrease burden of care at discharge.     Skilled Therapeutic Interventions          SLUMS and informal assessment measures administered. Please see report for full details.   SLP Assessment  Patient will need skilled [place] [facility] Services during CIR admission    Recommendations  Patient destination: Home Follow up Recommendations: 24 hour supervision/assistance;Home Health SLP Equipment Recommended: None recommended by SLP    SLP Frequency 3 to 5 out of 7 days   SLP Duration  SLP Intensity  SLP Treatment/Interventions 2-2.5 weeks  Minumum of 1-2 x/day, 30 to 90 minutes  Cognitive remediation/compensation;Internal/external aids;Speech/Language facilitation;Functional tasks;Patient/family education;Therapeutic Activities    Pain  None/denied  Prior Functioning Cognitive/Linguistic Baseline: Within functional limits Type of Home: House  Lives With: Alone Available Help at Discharge: Other (Comment) (has 3 adult children which live at least an hour away) Education: high school Vocation: Retired  Programmer, systems Overall Cognitive Status: Impaired/Different from baseline Arousal/Alertness: Awake/alert Orientation Level: Oriented to person;Oriented to place;Oriented to situation Year: 2024 Month: March Attention:  Focused;Sustained;Selective Focused Attention: Appears intact Sustained Attention: Appears intact Selective Attention: Impaired Selective Attention Impairment: Verbal basic Memory: Impaired Memory Impairment: Decreased recall of new information;Retrieval deficit Awareness: Appears intact Problem Solving: Impaired Problem Solving Impairment: Verbal complex Executive Function: Sequencing;Organizing;Self Monitoring;Self Correcting Sequencing: Impaired Sequencing Impairment: Verbal complex Organizing: Impaired Organizing Impairment: Verbal basic;Functional basic Self Monitoring: Impaired Self Monitoring Impairment: Verbal basic;Functional basic Self Correcting: Impaired Self Correcting Impairment: Verbal basic;Functional basic Safety/Judgment: Impaired [employer] Scales of Cognitive Functioning: Automatic, Appropriate  Comprehension Auditory Comprehension Overall Auditory Comprehension: Appears within functional limits for tasks assessed Expression Expression Primary Mode of Expression: Verbal Verbal Expression Overall Verbal Expression: Impaired (word finding difficulty) Initiation: No impairment Level of Generative/Spontaneous Verbalization: Sentence;Conversation Repetition: No impairment Naming: No impairment Responsive: 76-100% accurate Convergent: 50-74% accurate Verbal Errors: Other (comment) Pragmatics: No impairment Oral Motor Oral Motor/Sensory Function Overall Oral Motor/Sensory Function: Within functional limits Motor Speech Overall Motor Speech: Appears within functional limits for tasks assessed Articulation: Within functional limitis Intelligibility: Intelligible  Care Tool Care Tool Cognition Ability to hear (with hearing aid or hearing appliances if normally used Ability to hear (with hearing aid or hearing appliances if normally used): 0. Adequate - no difficulty in normal conservation, social interaction, listening to TV   Expression of Ideas and  Wants Expression of Ideas and Wants: 3.  Some difficulty - exhibits some difficulty with expressing needs and ideas (e.g, some words or finishing thoughts) or speech is not clear   Understanding Verbal and Non-Verbal Content Understanding Verbal and Non-Verbal Content: 3. Usually understands - understands most conversations, but misses some part/intent of message. Requires cues at times to understand  Memory/Recall Ability Memory/Recall Ability : Current season;That he or she is in a hospital/hospital unit   Intelligibility: Intelligible  Short Term Goals: Week 1: SLP Short Term Goal 1 (Week 1): Patient will implement word finding strategies to overcome communication breakdowns with min A verbal cues for effectiveness SLP Short Term Goal 2 (Week 1): Patient will complete financial management tasks (e.g., check writing) with min-to-mod A verbal/visual cues SLP Short Term Goal 3 (Week 1): Patient will complete medicaiton management tasks (e.g., pillbox organization) with min-to-mod A verbal/visual cues SLP Short Term Goal 4 (Week 1): Patient will complete calendar organization tasks with min-to-mod A verbal/visual cues SLP Short Term Goal 5 (Week 1): Patient will utilize beneficial memory compensations to recall novel information and/or to assist with recall of to-do lists, appointments, etc with min-to-mod A verbal/visual cues  Refer to Care Plan for Long Term Goals  Recommendations for other services: Neuropsych  Discharge Criteria: Patient will be discharged from SLP if patient refuses treatment 3 consecutive times without medical reason, if treatment goals not met, if there is a change in medical status, if patient makes no progress towards goals or if patient is discharged from hospital.  The above assessment, treatment plan, treatment alternatives and goals were discussed and mutually agreed upon: by patient  [name] 12/02/2022, 5:48 PM

## 2022-12-02 NOTE — Evaluation (Signed)
Physical Therapy Assessment and Plan  Patient Details  Name: [name] MRN: [id number] Date of Birth: 04-Apr-1941  PT Diagnosis: Cognitive deficits, Difficulty walking, and Muscle weakness Rehab Potential: Good ELOS: 2-2.5 weeks   Today's Date: 12/02/2022 PT Individual Time: 1300-1359 PT Individual Time Calculation (min): 72 min    Hospital Problem: Principal Problem:   Subarachnoid hemorrhage ([place])   Past Medical History:  Past Medical History:  Diagnosis Date   Anemia    Anxiety    Arthritis    Back pain    reason unknown   Cataract    bilateral immature   Diarrhea    Diverticulosis    GERD (gastroesophageal reflux disease)    takes Omeprazole daily   Headache(784.0)    occasionally   Hiatal hernia    History of blood transfusion    no abnormal reaction noted   History of bronchitis 2014   History of colon polyps    History of shingles    Hypertension    takes Metoprolol,Amlodipine and Losartan daily   Internal hemorrhoids    Joint pain    Joint swelling    Lung nodule    Nocturia    Numbness    hands occasionally   Osteoarthritis    Peripheral edema    takes HCTZ daily and Furosemide daily as needed   PONV (postoperative nausea and vomiting)    Redness    to both lower extremities   Shortness of breath    WITH EXERTION    Sleep apnea    doesn't use cpap   Urinary frequency    Urinary urgency    Venous insufficiency    Past Surgical History:  Past Surgical History:  Procedure Laterality Date   ABDOMINAL HYSTERECTOMY  1983   ANKLE SURGERY Left 1993   ANTERIOR CRUCIATE LIGAMENT REPAIR Left 2013   BREAST SURGERY     LEFT SIDE BIOPSY 1989   CHOLECYSTECTOMY  1990   COLONOSCOPY     ENDOVENOUS ABLATION SAPHENOUS VEIN W/ LASER Right 04/17/2017   endovenous laser ablation R [facility] by Tinnie Gens MD   ESOPHAGOGASTRODUODENOSCOPY     HAND SURGERY Left 2011   [place]  07/16/2012   Procedure: REPAIR QUADRICEP TENDON;  Surgeon: Rudean Haskell, MD;  Location: [facility];  Service: Orthopedics;  Laterality: Left;  open repair of VMO    quiadricp  10'21/2012   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;  Service: Orthopedics;  Laterality: Left;   TOTAL KNEE ARTHROPLASTY Right 11/04/2013   DR Ronnie Derby   TOTAL KNEE ARTHROPLASTY Right 11/04/2013   Procedure: RIGHT TOTAL KNEE ARTHROPLASTY;  Surgeon: Vickey Huger, MD;  Location: [facility];  Service: Orthopedics;  Laterality: Right;   TUBAL LIGATION  1974   vasculitic eruption of legs  1985    Assessment & Plan Clinical Impression: Patient is a 82 year old female who presented to the emergency department at [facility] on with reports of recent fall approximately 2 days prior to presentation.  CT head significant for head bleed and transferred to [facility].  During transport, she was noted to experience increasing agitation and focal seizure activity.  She was given IV Versed and IV Keppra. She is maintained on Eliquis for permanent atrial fibrillation. This was reversed with Andexxa.  Repeat CT head scan showed normal evolution of cerebral blood collections of the subarachnoid and subdural spaces. No further seizure  activity. Cardiology consulted for history of a. fib, heart failure and lower extremity edema. Eliquis on hold until cleared to resume by NS. AKI noted. Suspect that lower extremity edema is primarily driven by combination of lymphedema and chronic venous insufficiency. Discussed follow-up of OSA. Diuretics ongoing. No plan in notes for transitioning to oral Lasix. Tolerating diet. NS sees no need for repeat CT head. The patient requires inpatient medicine and rehabilitation evaluations and services for ongoing dysfunction secondary to ubdural hematoma/subarachnoid hemorrhage.    On exam, she is c/o dizziness when turning her head too quickly in either durectiuon. She also endorses longstanding  presyncope on STS requiring her to stay still for 1+ minutes before moving. She lives alone in a 1 story home with 3 steps to b/b; no family available to assist her. Family aware she is unlikely to be independent level at discharge and is working to get her supervision either with home aides or a  facility.    Patient transferred to CIR on 12/01/2022 .   Patient currently requires mod with mobility secondary to muscle weakness, decreased cardiorespiratoy endurance, decreased coordination, decreased problem solving, decreased memory, and demonstrates behaviors consistent with [place] Level [place], and decreased sitting balance, decreased standing balance, decreased postural control, hemiplegia, and decreased balance strategies.  Prior to hospitalization, patient was modified independent  with mobility and lived with Alone in a House home.  Home access is 3Stairs to enter.  Patient will benefit from skilled PT intervention to maximize safe functional mobility, minimize fall risk, and decrease caregiver burden for planned discharge home with 24 hour supervision.  Anticipate patient will benefit from follow up [place] at discharge.  PT - End of Session Activity Tolerance: Tolerates 30+ min activity with multiple rests Endurance Deficit: Yes PT Assessment Rehab Potential (ACUTE/IP ONLY): Good PT Barriers to Discharge: Decreased caregiver support;Home environment access/layout PT Patient demonstrates impairments in the following area(s): Balance;Behavior;Endurance;Motor;Pain;Safety;Perception;Skin Integrity PT Transfers Functional Problem(s): Bed Mobility;Bed to Chair;Car;Furniture PT Locomotion Functional Problem(s): Ambulation;Stairs PT Plan PT Intensity: Minimum of 1-2 x/day ,45 to 90 minutes PT Frequency: 5 out of 7 days PT Duration Estimated Length of Stay: 2-2.5 weeks PT Treatment/Interventions: Ambulation/gait training;Community reintegration;DME/adaptive equipment instruction;Neuromuscular  re-education;Psychosocial support;Stair training;UE/LE Strength taining/ROM;Balance/vestibular training;Discharge planning;Functional electrical stimulation;Pain management;Skin care/wound management;Therapeutic Activities;UE/LE Coordination activities;Cognitive remediation/compensation;Disease management/prevention;Functional mobility training;Patient/family education;Splinting/orthotics;Therapeutic Exercise;Visual/perceptual remediation/compensation;Wheelchair propulsion/positioning PT Transfers Anticipated Outcome(s): Supervision PT Locomotion Anticipated Outcome(s): Supervision PT Recommendation Recommendations for Other Services: Therapeutic Recreation consult Therapeutic Recreation Interventions: Pet therapy;Kitchen group;Stress management Follow Up Recommendations: Home health PT;24 hour supervision/assistance Patient destination: Home Equipment Recommended: To be determined   PT Evaluation Precautions/Restrictions Precautions Precautions: Fall Restrictions Weight Bearing Restrictions: No General Chart Reviewed: Yes Family/Caregiver Present: No  Pain Interference Pain Interference Pain Effect on Sleep: 1. Rarely or not at all Pain Interference with Therapy Activities: 1. Rarely or not at all Pain Interference with Day-to-Day Activities: 3. Frequently Home Living/Prior Functioning Home Living Living Arrangements: Alone Type of Home: House Home Access: Stairs to enter Entrance Stairs-Number of Steps: 3 Entrance Stairs-Rails: Can reach both Home Layout: One level Bathroom Shower/Tub: Walk-in Sales promotion account executive: Handicapped height Bathroom Accessibility: Yes  Lives With: Alone Prior Function Level of Independence: Independent with basic ADLs;Independent with homemaking with ambulation  Able to Take Stairs?: Yes Driving: Yes Vision/Perception  Vision - History Ability to See in Adequate Light: 1 Impaired Vision - Assessment Eye Alignment: Within Functional  Limits Alignment/Gaze Preference: Within Defined Limits Tracking/Visual Pursuits: Decreased smoothness of vertical tracking Saccades: Decreased speed of  saccadic movement Convergence: Impaired (comment) Perception Perception: Within Functional Limits Praxis Praxis: Impaired Praxis Impairment Details: Ideomotor  Cognition Overall Cognitive Status: Impaired/Different from baseline Arousal/Alertness: Awake/alert Orientation Level: Oriented to person;Oriented to place;Oriented to situation;Disoriented to time Memory: Impaired Problem Solving: Impaired Safety/Judgment: Impaired Sensation Sensation Light Touch: Appears Intact Coordination Gross Motor Movements are Fluid and Coordinated: Yes Fine Motor Movements are Fluid and Coordinated: No Finger Nose Finger Test: slow, misses finger tip BUE Motor  Motor Motor: Hemiplegia;Abnormal postural alignment and control Motor - Skilled Clinical Observations: mild L hemi  Trunk/Postural Assessment  Cervical Assessment Cervical Assessment:  (head forward) Thoracic Assessment Thoracic Assessment:  (rounded shoulders) Lumbar Assessment Lumbar Assessment:  (post pelvic tilt) Postural Control Postural Control: Deficits on evaluation (decreased/ insufficient)  Balance Balance Balance Assessed: Yes Dynamic Sitting Balance Dynamic Sitting - Balance Support: Feet supported;During functional activity Dynamic Sitting - Level of Assistance: 5: Stand by assistance Static Standing Balance Static Standing - Balance Support: During functional activity;Bilateral upper extremity supported Static Standing - Level of Assistance: 4: Min assist Dynamic Standing Balance Dynamic Standing - Balance Support: During functional activity;Bilateral upper extremity supported Dynamic Standing - Level of Assistance: 3: Mod assist Extremity Assessment  RUE Assessment RUE Assessment: Exceptions to [facility] General Strength Comments: decreased coordination, ? vision  impariment more than motor on this side LUE Assessment LUE Assessment: Exceptions to [facility] General Strength Comments: weakness LUE>RUE, decreased coodination RLE Assessment RLE Assessment: Exceptions to [facility] General Strength Comments: Grossly 4+/5, endurance impaired LLE Assessment LLE Assessment: Exceptions to [facility] General Strength Comments: Grossly 3+/5, endurance impaired  Care Tool Care Tool Bed Mobility Roll left and right activity   Roll left and right assist level: Moderate Assistance - Patient 50 - 74%    Sit to lying activity   Sit to lying assist level: Moderate Assistance - Patient 50 - 74%    Lying to sitting on side of bed activity   Lying to sitting on side of bed assist level: the ability to move from lying on the back to sitting on the side of the bed with no back support.: Moderate Assistance - Patient 50 - 74%     Care Tool Transfers Sit to stand transfer   Sit to stand assist level: Moderate Assistance - Patient 50 - 74%    Chair/bed transfer   Chair/bed transfer assist level: 2 Pension scheme manager transfer   Assist Level: 2 Psychologist, prison and probation services transfer assist level: Moderate Assistance - Patient 50 - 74%      Care Tool Locomotion Ambulation   Assist level: 2 helpers Assistive device: Walker-rolling Max distance: 26'  Walk 10 feet activity   Assist level: 2 helpers Assistive device: Walker-rolling   Walk 50 feet with 2 turns activity Walk 50 feet with 2 turns activity did not occur: Safety/medical concerns      Walk 150 feet activity Walk 150 feet activity did not occur: Safety/medical concerns      Walk 10 feet on uneven surfaces activity Walk 10 feet on uneven surfaces activity did not occur: Safety/medical concerns      Stairs   Assist level: Minimal Assistance - Patient > 75% Stairs assistive device: 2 hand rails Max number of stairs: 4  Walk up/down 1 step activity   Walk up/down 1 step (curb) assist level: Minimal Assistance -  Patient > 75% Walk up/down 1 step or curb assistive device: 2 hand rails  Walk up/down 4 steps activity  Walk up/down 4 steps assist level: Minimal Assistance - Patient > 75% Walk up/down 4 steps assistive device: 2 hand rails  Walk up/down 12 steps activity Walk up/down 12 steps activity did not occur: Safety/medical concerns      Pick up small objects from floor   Pick up small object from the floor assist level: Maximal Assistance - Patient 25 - 49%    Wheelchair Is the patient using a wheelchair?: Yes Type of Wheelchair: Manual   Wheelchair assist level: Dependent - Patient 0% Max wheelchair distance: 150'  Wheel 50 feet with 2 turns activity   Assist Level: Dependent - Patient 0%  Wheel 150 feet activity   Assist Level: Dependent - Patient 0%    Refer to Care Plan for Long Term Goals  SHORT TERM GOAL WEEK 1 PT Short Term Goal 1 (Week 1): Pt will complete bed mobility with CGA. PT Short Term Goal 2 (Week 1): Pt will complete bed to chair transfer consistently with CGA. PT Short Term Goal 3 (Week 1): Pt will ambulate x50' with minA and LRAD. PT Short Term Goal 4 (Week 1): Pt will complete x8 steps with bilateral hand rails and minA.  Recommendations for other services: Therapeutic Recreation  Pet therapy, Kitchen group, and Stress management  Skilled Therapeutic Intervention  Evaluation completed (see details above and below) with education on PT POC and goals and individual treatment initiated with focus on balance, transfers, ambulation, and stair training. Pt received seated in WC and agrees to therapy. No complaint of pain. WC transport to gym for time management. Pt performs sit to stand with modA and RW, with cues for hand placement and initiation. Pt initially anxious about falling but is able to complete mobility tasks with gentle encouragement. Pt completes car transfer with RW and modA, with cues for sequencing and positioning, and PT manually assisting lower  extremities L>R for entry into car. Following rest break, pt stands with RW and ambulates x26' with RW and minA +1, with +2 WC follow for safety. Cues provided for upright gaze to improve posture and balance, and increasing stride length to decrease risk for falls. Seated rest break. Pt completes x4 6" steps with bilateral hand rails and cues for step sequencing, with minA. WC transport back to room. Left seated with all needs within reach and daughter present.   Mobility Transfers Transfers: Sit to Stand;Stand to Sit;Stand Pivot Transfers Sit to Stand: 2 Helpers;Moderate Assistance - Patient 50-74% Stand to Sit: 2 Helpers;Moderate Assistance - Patient 50-74% Stand Pivot Transfers: 2 Helpers;Moderate Assistance - Patient 50 - 74% Stand Pivot Transfer Details: Manual facilitation for weight shifting;Verbal cues for safe use of DME/AE;Verbal cues for precautions/safety Stand Pivot Transfer Details (indicate cue type and reason): shuffling feet Transfer (Assistive device): Rolling walker Locomotion  Gait Ambulation: Yes Gait Assistance: 2 Helpers Gait Distance (Feet): 26 Feet Assistive device: Rolling walker (minA +1 and +2 WC follow for safety) Gait Assistance Details: Verbal cues for sequencing;Verbal cues for gait pattern;Verbal cues for technique;Tactile cues for posture Gait Gait: Yes Gait Pattern: Impaired Gait Pattern:  (shuffling) Stairs / Additional Locomotion Stairs: Yes Stairs Assistance: Minimal Assistance - Patient > 75% Stair Management Technique: Two rails Number of Stairs: 4 Height of Stairs: 6 Curb: Minimal Assistance - Patient >75% Wheelchair Mobility Wheelchair Mobility: No   Discharge Criteria: Patient will be discharged from PT if patient refuses treatment 3 consecutive times without medical reason, if treatment goals not met, if there is a change in medical  status, if patient makes no progress towards goals or if patient is discharged from hospital.  The above  assessment, treatment plan, treatment alternatives and goals were discussed and mutually agreed upon: by patient  Breck Coons, PT, DPT 12/02/2022, 4:52 PM

## 2022-12-02 NOTE — Progress Notes (Signed)
Occupational Therapy Assessment and Plan  Patient Details  Name: [name] MRN: [id number] Date of Birth: 08/20/1941  OT Diagnosis: abnormal posture, acute pain, cognitive deficits, hemiplegia affecting non-dominant side, muscle weakness (generalized), and swelling of limb Rehab Potential: Rehab Potential (ACUTE ONLY): Good ELOS: ~2.5 weeks   Today's Date: 12/02/2022 OT Individual Time: [id number] OT Individual Time Calculation (min): 75 min     Hospital Problem: Principal Problem:   Subarachnoid hemorrhage ([place])   Past Medical History:  Past Medical History:  Diagnosis Date   Anemia    Anxiety    Arthritis    Back pain    reason unknown   Cataract    bilateral immature   Diarrhea    Diverticulosis    GERD (gastroesophageal reflux disease)    takes Omeprazole daily   Headache(784.0)    occasionally   Hiatal hernia    History of blood transfusion    no abnormal reaction noted   History of bronchitis 2014   History of colon polyps    History of shingles    Hypertension    takes Metoprolol,Amlodipine and Losartan daily   Internal hemorrhoids    Joint pain    Joint swelling    Lung nodule    Nocturia    Numbness    hands occasionally   Osteoarthritis    Peripheral edema    takes HCTZ daily and Furosemide daily as needed   PONV (postoperative nausea and vomiting)    Redness    to both lower extremities   Shortness of breath    WITH EXERTION    Sleep apnea    doesn't use cpap   Urinary frequency    Urinary urgency    Venous insufficiency    Past Surgical History:  Past Surgical History:  Procedure Laterality Date   ABDOMINAL HYSTERECTOMY  1983   ANKLE SURGERY Left 1993   ANTERIOR CRUCIATE LIGAMENT REPAIR Left 2013   BREAST SURGERY     LEFT SIDE BIOPSY 1989   CHOLECYSTECTOMY  1990   COLONOSCOPY     ENDOVENOUS ABLATION SAPHENOUS VEIN W/ LASER Right 04/17/2017   endovenous laser ablation R [facility] by Tinnie Gens MD   ESOPHAGOGASTRODUODENOSCOPY     HAND  SURGERY Left 2011   [place]  07/16/2012   Procedure: REPAIR QUADRICEP TENDON;  Surgeon: Rudean Haskell, MD;  Location: [place];  Service: Orthopedics;  Laterality: Left;  open repair of VMO    quiadricp  10'21/2012   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 female presents to [facility] hospital on 11/28/2022 after multiple fall and AMS. 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 PMH includes anxiety, OA, GERD, HTN   Patient currently requires mod with basic self-care skills secondary to muscle weakness, decreased cardiorespiratoy endurance, unbalanced muscle activation and decreased coordination, decreased visual perceptual skills, ideational apraxia, decreased attention, decreased awareness, decreased problem solving, decreased safety awareness, decreased memory, delayed processing, and demonstrates behaviors consistent with [place], and decreased sitting balance, decreased  standing balance, decreased postural control, hemiplegia, and decreased balance strategies.  Prior to hospitalization, patient could complete BADL with mod.  Patient will benefit from skilled intervention to decrease level of assist with basic self-care skills and increase independence with basic self-care skills prior to discharge home with care partner.  Anticipate patient will require 24 hour supervision and follow up home health.  OT - End of Session Activity Tolerance: Tolerates 30+ min activity with multiple rests Endurance Deficit: Yes OT Assessment Rehab  Potential (ACUTE ONLY): Good OT Barriers to Discharge: Inaccessible home environment;Decreased caregiver support;Lack of/limited family support OT Patient demonstrates impairments in the following area(s): Balance;Cognition;Endurance;Edema;Pain;Perception;Motor;Safety;Skin Integrity;Vision OT Basic ADL's Functional Problem(s): Bathing;Grooming;Dressing;Toileting OT Transfers Functional Problem(s): Toilet;Tub/Shower OT Additional Impairment(s): Fuctional Use of Upper Extremity OT Plan OT Intensity: Minimum of 1-2 x/day, 45 to 90 minutes OT Frequency: 5 out of 7 days OT Duration/Estimated Length of Stay: ~2.5 weeks OT Treatment/Interventions: Balance/vestibular training;Discharge planning;Pain management;Self Care/advanced ADL retraining;Therapeutic Activities;UE/LE Coordination activities;Visual/perceptual remediation/compensation;Therapeutic Exercise;Skin care/wound managment;Patient/family education;Functional mobility training;Disease mangement/prevention;Cognitive remediation/compensation;Community reintegration;DME/adaptive equipment instruction;Neuromuscular re-education;Psychosocial support;Splinting/orthotics;UE/LE Strength taining/ROM;Wheelchair propulsion/positioning OT Self Feeding Anticipated Outcome(s): S OT Basic Self-Care Anticipated Outcome(s): S OT Toileting Anticipated Outcome(s): s OT Bathroom Transfers Anticipated Outcome(s): S OT Recommendation Recommendations for Other Services: Therapeutic Recreation consult Therapeutic Recreation Interventions: Outing/community reintergration;Stress management;Pet therapy Patient destination: Home Follow Up Recommendations: Home health OT Equipment Recommended: To be determined   OT Evaluation Precautions/Restrictions  Precautions Precautions: Fall Restrictions Weight Bearing Restrictions: No General Chart Reviewed: Yes Family/Caregiver Present: No Vital Signs   Pain   Home Living/Prior Functioning Home  Living Family/patient expects to be discharged to:: Private residence Living Arrangements: Alone Type of Home: House Home Access: Stairs to enter Technical brewer of Steps: 3 Entrance Stairs-Rails: Can reach both Home Layout: One level Bathroom Shower/Tub: Gaffer, Architectural technologist: Handicapped height Bathroom Accessibility: Yes  Lives With: Alone Prior Function Level of Independence: Independent with basic ADLs, Independent with homemaking with ambulation  Able to Take Stairs?: Yes Driving: Yes Vision Baseline Vision/History: 1 Wears glasses Ability to See in Adequate Light: 1 Impaired Patient Visual Report: Blurring of vision;Other (comment) Vision Assessment?: Vision impaired- to be further tested in functional context;Yes Eye Alignment: Within Functional Limits Alignment/Gaze Preference: Within Defined Limits Tracking/Visual Pursuits: Decreased smoothness of vertical tracking Saccades: Decreased speed of saccadic movement Convergence: Impaired (comment) Additional Comments: L eey blurry Perception  Perception: Within Functional Limits Praxis Praxis: Impaired Praxis Impairment Details: Ideomotor Cognition Cognition Overall Cognitive Status: Impaired/Different from baseline Arousal/Alertness: Awake/alert Orientation Level: Person;Place;Situation Person: Oriented Place: Oriented Situation: Oriented Memory: Impaired Safety/Judgment: Impaired Brief Interview for Mental Status (BIMS) Repetition of Three Words (First Attempt): 3 Temporal Orientation: Year: Missed by more than 5 years Temporal Orientation: Month: Accurate within 5 days Temporal Orientation: Day: Incorrect Recall: "Sock": No, could not recall Recall: "Blue": Yes, after cueing ("a color") Recall: "Bed": No, could not recall BIMS Summary Score: 6 Sensation Sensation Light Touch: Appears Intact Coordination Fine Motor Movements are Fluid and Coordinated: No Finger Nose Finger Test:  slow, misses finger tip BUE Motor  Motor Motor: Hemiplegia;Abnormal postural alignment and control Motor - Skilled Clinical Observations: mild L hemi  Trunk/Postural Assessment  Cervical Assessment Cervical Assessment:  (head forward) Thoracic Assessment Thoracic Assessment:  (rounded shoulders) Lumbar Assessment Lumbar Assessment:  (post pelvic tilt) Postural Control Postural Control: Deficits on evaluation (decreased/ insufficient)  Balance Balance Balance Assessed: Yes Dynamic Sitting Balance Dynamic Sitting - Balance Support: Feet supported;During functional activity Dynamic Sitting - Level of Assistance:  5: Stand by assistance Static Standing Balance Static Standing - Balance Support: During functional activity;Bilateral upper extremity supported Static Standing - Level of Assistance: 4: Min assist Dynamic Standing Balance Dynamic Standing - Balance Support: During functional activity;Bilateral upper extremity supported Dynamic Standing - Level of Assistance: 3: Mod assist Extremity/Trunk Assessment RUE Assessment RUE Assessment: Exceptions to [facility] General Strength Comments: decreased coordination, ? vision impariment more than motor on this side LUE Assessment LUE Assessment: Exceptions to [facility] General Strength Comments: weakness LUE>RUE, decreased coodination  Care Tool Care Tool Self Care Eating   Eating Assist Level: Set up assist    Oral Care    Oral Care Assist Level: Set up assist    Bathing   Body parts bathed by patient: Right arm;Left arm;Chest;Abdomen;Right upper leg;Left upper leg;Face Body parts bathed by helper: Front perineal area;Buttocks;Right lower leg;Left lower leg   Assist Level: 2 Helpers    Upper Body Dressing(including orthotics)   What is the patient wearing?: Pull over shirt;Bra   Assist Level: Minimal Assistance - Patient > 75%    Lower Body Dressing (excluding footwear)   What is the patient wearing?: Underwear/pull up;Pants Assist  for lower body dressing: 2 Helpers    Putting on/Taking off footwear   What is the patient wearing?: Non-skid slipper socks Assist for footwear: Dependent - Patient 0%       Care Tool Toileting Toileting activity   Assist for toileting: 2 Helpers     Care Tool Bed Mobility Roll left and right activity        Sit to lying activity        Lying to sitting on side of bed activity         Care Tool Transfers Sit to stand transfer   Sit to stand assist level: 2 Helpers    Chair/bed transfer   Chair/bed transfer assist level: 2 Pension scheme manager transfer   Assist Level: 2 Helpers     Care Tool Cognition  Expression of Ideas and Wants Expression of Ideas and Wants: 4. Without difficulty (complex and basic) - expresses complex messages without difficulty and with speech that is clear and easy to understand  Understanding Verbal and Non-Verbal Content Understanding Verbal and Non-Verbal Content: 4. Understands (complex and basic) - clear comprehension without cues or repetitions   Memory/Recall Ability Memory/Recall Ability : Current season;That he or she is in a hospital/hospital unit   Refer to Care Plan for [place] 1 OT Short Term Goal 1 (Week 1): Pt will stand to groom at sink to demo improved activity tolerance OT Short Term Goal 2 (Week 1): Pt will complete SPT wiht LRAD and MIN A OT Short Term Goal 3 (Week 1): Pt will complete 2/3 steps of toileting with MIN A for balance OT Short Term Goal 4 (Week 1): Pt will be A&O x 4 with compensatory strategies PRN  Recommendations for other services: Therapeutic Recreation  Pet therapy, Stress management, and Outing/community reintegration   Skilled Therapeutic Intervention ADL ADL Eating: Supervision/safety Where Assessed-Eating: Bed level Grooming: Setup Where Assessed-Grooming: Sitting at sink Upper Body Bathing: Supervision/safety Where Assessed-Upper Body Bathing: Edge of bed Lower  Body Bathing: Dependent (+2 sit to stand MOD A) Where Assessed-Lower Body Bathing: Edge of bed Upper Body Dressing: Minimal assistance Where Assessed-Upper Body Dressing: Edge of bed Lower Body Dressing: Dependent (+2 sit to stand (MODA)) Where Assessed-Lower Body Dressing: Edge of bed Toileting: Dependent;Moderate assistance (MOD A +  2) Toilet Transfer: Moderate assistance ADL Comments: Pt recieved in bed. Edu re OT role/purpose, CIR, ELOS, POC and TBI recovery. pt completes BADL at EOB as stated above. Overall pt demo insight into deficits but poor frustration with self that she was unable to recall orientation to time questions. edu on compensatory strategies able to carry over wiht question cues. pt aware will need supervision when going home. Pt is anxious wiht mobiltiy but moves with MOD A+2 present for clothing manamgnet and pt comfort. Pt with dizziness wiht transitonal movements but no nystagmus noted. VSS throughout session but elevated overall--anxiety related? Exited session with pt seated in w/c, exit alarm on and call light in reach Mobility  Transfers Sit to Stand: 2 Helpers;Moderate Assistance - Patient 50-74% Stand to Sit: 2 Helpers;Moderate Assistance - Patient 50-74%   Discharge Criteria: Patient will be discharged from OT if patient refuses treatment 3 consecutive times without medical reason, if treatment goals not met, if there is a change in medical status, if patient makes no progress towards goals or if patient is discharged from hospital.  The above assessment, treatment plan, treatment alternatives and goals were discussed and mutually agreed upon: by patient  Tonny Branch 12/02/2022, 12:34 PM

## 2022-12-03 DIAGNOSIS — R600 Localized edema: Secondary | ICD-10-CM

## 2022-12-03 DIAGNOSIS — S069XAA Unspecified intracranial injury with loss of consciousness status unknown, initial encounter: Principal | ICD-10-CM

## 2022-12-03 DIAGNOSIS — N1831 Chronic kidney disease, stage 3a: Secondary | ICD-10-CM | POA: Diagnosis not present

## 2022-12-03 DIAGNOSIS — S069XAS Unspecified intracranial injury with loss of consciousness status unknown, sequela: Secondary | ICD-10-CM

## 2022-12-03 DIAGNOSIS — I2781 Cor pulmonale (chronic): Secondary | ICD-10-CM

## 2022-12-03 DIAGNOSIS — I4821 Permanent atrial fibrillation: Secondary | ICD-10-CM | POA: Diagnosis not present

## 2022-12-03 DIAGNOSIS — I5043 Acute on chronic combined systolic (congestive) and diastolic (congestive) heart failure: Secondary | ICD-10-CM | POA: Diagnosis not present

## 2022-12-03 DIAGNOSIS — I609 Nontraumatic subarachnoid hemorrhage, unspecified: Secondary | ICD-10-CM | POA: Diagnosis not present

## 2022-12-03 MED ORDER — TORSEMIDE 20 MG PO TABS
40.0000 mg | ORAL_TABLET | Freq: Two times a day (BID) | ORAL | Status: DC
  Administered 2022-12-03: 40 mg via ORAL
  Filled 2022-12-03: qty 2

## 2022-12-03 NOTE — Consult Note (Signed)
Cardiology Consultation   Patient ID: [name] MRN: [id number]; DOB: December 18, 1940  Admit date: 12/01/2022 Date of Consult: 12/03/2022  PCP:  Cathie Olden, MD   [facility] Cardiologist:  Carlyle Dolly, MD  Cardiology APP:  Finis Bud, NP       Patient Profile:   [name] is a 82 y.o. female with a hx of chronic diastolic CHF, hypertension, persistent atrial fibrillation, chronic venous insufficiency, OSA who is being seen 12/03/2022 for the evaluation of leg swelling, heart failure at the request of Dr. Christella Noa.  History of Present Illness:   [name] presented to the [facility] ED on 3/3 after she developed acute confusion and flashing lights in visit. These symptoms followed a fall about 72 hours ago when hit the back of her head. Apparently patient has been unstable/struggling with ambulatory stability for several weeks. Imaging workup notable for subdural hematoma, traumatic subarachnoid hemorrhage. Cardiology was asked to consult on patient due to lower extremity edema.  During neuro ICU eval,  patient had limited memory of recent history after her falls. She had dyspnea on exertion and lower extremity edema have gotten slightly worse in recent weeks  Found over the course of her inpatient evaluation to have right sided heart failure.  With aggressive diuresis has had improvement in her leg swelling and in her breathing.  She continues to get stronger.    In transition to inpatient rehab was found to have an increase in her creatinine.  For this reason PO transition to diuretics and we have been consulted for assistance while in rehab.  She lots 10 kgs during diuresis through inpatient evaluation. She has more definition her her face.  She has improved strength.  She feels better.   Past Medical History:  Diagnosis Date   Anemia    Anxiety    Arthritis    Back pain    reason unknown   Cataract    bilateral immature   Diarrhea     Diverticulosis    GERD (gastroesophageal reflux disease)    takes Omeprazole daily   Headache(784.0)    occasionally   Hiatal hernia    History of blood transfusion    no abnormal reaction noted   History of bronchitis 2014   History of colon polyps    History of shingles    Hypertension    takes Metoprolol,Amlodipine and Losartan daily   Internal hemorrhoids    Joint pain    Joint swelling    Lung nodule    Nocturia    Numbness    hands occasionally   Osteoarthritis    Peripheral edema    takes HCTZ daily and Furosemide daily as needed   PONV (postoperative nausea and vomiting)    Redness    to both lower extremities   Shortness of breath    WITH EXERTION    Sleep apnea    doesn't use cpap   Urinary frequency    Urinary urgency    Venous insufficiency     Past Surgical History:  Procedure Laterality Date   ABDOMINAL HYSTERECTOMY  1983   ANKLE SURGERY Left 1993   ANTERIOR CRUCIATE LIGAMENT REPAIR Left 2013   BREAST SURGERY     LEFT SIDE BIOPSY 1989   CHOLECYSTECTOMY  1990   COLONOSCOPY     ENDOVENOUS ABLATION SAPHENOUS VEIN W/ LASER Right 04/17/2017   endovenous laser ablation R [facility] by Tinnie Gens MD   ESOPHAGOGASTRODUODENOSCOPY     HAND SURGERY  Left 2011   QUADRICEPS TENDON REPAIR  07/16/2012   Procedure: REPAIR QUADRICEP TENDON;  Surgeon: Rudean Haskell, MD;  Location: [facility];  Service: Orthopedics;  Laterality: Left;  open repair of VMO    quiadricp  10'21/2012   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   Procedure: TOTAL KNEE ARTHROPLASTY;  Surgeon: Rudean Haskell, MD;  Location: [facility];  Service: Orthopedics;  Laterality: Left;   TOTAL KNEE ARTHROPLASTY Right 11/04/2013   DR Ronnie Derby   TOTAL KNEE ARTHROPLASTY Right 11/04/2013   Procedure: RIGHT TOTAL KNEE ARTHROPLASTY;  Surgeon: Vickey Huger, MD;  Location: [facility];  Service: Orthopedics;  Laterality: Right;   TUBAL LIGATION  1974   vasculitic eruption of legs  1985     Home Medications:  Prior to Admission  medications   Medication Sig Start Date End Date Taking? Authorizing Provider  acetaminophen (TYLENOL) 500 MG tablet Take 500 mg by mouth every 6 (six) hours as needed for mild pain. For pain   Yes [provider]  albuterol (VENTOLIN HFA) 108 (90 Base) MCG/ACT inhaler Inhale 2 puffs into the lungs every 6 (six) hours as needed for wheezing or shortness of breath. 10/26/21  Yes Hunsucker, Bonna Gains, MD  apixaban (ELIQUIS) 5 MG TABS tablet Take 1 tablet (5 mg total) by mouth 2 (two) times daily. 10/20/22  Yes BranchAlphonse Guild, MD  Biotin 5000 MCG CAPS Take 1 capsule by mouth daily.   Yes [provider]  Calcium Citrate-Vitamin D 500-12.5 MG-MCG PACK Take by oral route.   Yes [provider]  ferrous sulfate 325 (65 FE) MG tablet Take 325 mg by mouth 2 (two) times daily with a meal. 04/22/22  Yes [provider]  furosemide (LASIX) 20 MG tablet Take 2 tablets (40 mg total) by mouth daily. (May take an additional '20mg'$  as needed for weight gain greater than 270lb.) Patient taking differently: Take 20 mg by mouth in the morning, at noon, and at bedtime. (May take an additional '20mg'$  as needed for weight gain greater than 270lb.) 08/25/22  Yes Branch, Alphonse Guild, MD  Glucosamine-Chondroitin (GLUCOSAMINE CHONDR COMPLEX PO) Take 1 capsule by mouth daily.   Yes [provider]  irbesartan (AVAPRO) 150 MG tablet Take 150 mg by mouth daily.   Yes [provider]  Magnesium 400 MG TABS Take 1 tablet by mouth 2 (two) times daily. For 4 days, then reduce to 400 mg daily 11/08/22  Yes Branch, Alphonse Guild, MD  Omega-3 Fatty Acids (OMEGA-3 FISH OIL PO) Take 1 capsule by mouth daily.   Yes [provider]  omeprazole (PRILOSEC) 40 MG capsule Take 40 mg by mouth every morning. 03/09/22  Yes [provider]  potassium chloride SA (KLOR-CON M) 20 MEQ tablet Take 2 tablets (40 mEq total) by mouth daily. 11/08/22  Yes BranchAlphonse Guild, MD  triamcinolone  lotion (KENALOG) 0.1 % Apply 1 application  topically 3 (three) times daily. For legs   Yes [provider]  vitamin B-12 (CYANOCOBALAMIN) 1000 MCG tablet Take 1,000 mcg by mouth daily.   Yes [provider]  vitamin E 180 MG (400 UNITS) capsule Take 400 Units by mouth daily.   Yes [provider]  metoprolol tartrate (LOPRESSOR) 50 MG tablet TAKE 1 TABLET BY MOUTH TWICE A DAY 11/28/22   Arnoldo Lenis, MD    Inpatient Medications: Scheduled Meds:  cyanocobalamin  1,000 mcg Oral Daily   docusate sodium  100 mg Oral BID  empagliflozin  10 mg Oral Daily   ferrous sulfate  325 mg Oral BID WC   furosemide  40 mg Oral BID   irbesartan  150 mg Oral Daily   magnesium oxide  400 mg Oral Daily   metoprolol tartrate  50 mg Oral BID   pantoprazole  80 mg Oral Daily   potassium chloride SA  40 mEq Oral Daily   senna  1 tablet Oral Daily   spironolactone  25 mg Oral Daily   Continuous Infusions:  sodium chloride 10 mL/hr at 12/01/22 1618   PRN Meds: acetaminophen, alum & mag hydroxide-simeth, calcium carbonate, guaiFENesin-dextromethorphan, meclizine, methocarbamol, polyethylene glycol, prochlorperazine **OR** prochlorperazine **OR** prochlorperazine, sodium phosphate, sorbitol, traZODone  Allergies:    Allergies  Allergen Reactions   Sulfa Antibiotics     Reaction unknown   Adhesive [Tape] Other (See Comments) and Rash    Skin is very sensitive Skin is very sensitive Skin is very sensitive    Social History:   Social History   Socioeconomic History   Marital status: Single    Spouse name: Not on file   Number of children: 3   Years of education: 14   Highest education level: Not on file  Occupational History   Occupation: retired  Tobacco Use   Smoking status: Never   Smokeless tobacco: Never  Vaping Use   Vaping Use: Never used  Substance and Sexual Activity   Alcohol use: No    Alcohol/week: 0.0 standard drinks of alcohol   Drug use: No    Sexual activity: Never    Birth control/protection: Surgical  Other Topics Concern   Not on file  Social History Narrative   Fun: Exercise class twice per week.    Denies abuse and feels safe at home.    Social Determinants of Health   Financial Resource Strain: Not on file  Food Insecurity: Not on file  Transportation Needs: Not on file  Physical Activity: Not on file  Stress: Not on file  Social Connections: Not on file  Intimate Partner Violence: Not on file    Family History:    Family History  Problem Relation Age of Onset   Lymphoma Father    Leukemia Father    Heart attack Father    Hypertension Mother    Emphysema Mother    Heart disease Sister    Rashes / Skin problems Sister    Heart failure Sister    Kidney disease Sister    Heart disease Maternal Grandfather    Heart disease Paternal Grandmother    Heart disease Paternal Grandfather      ROS:  Please see the history of present illness.   All other ROS reviewed and negative.     Physical Exam/Data:   Vitals:   12/02/22 1310 12/02/22 2002 12/03/22 0327 12/03/22 0359  BP: (!) 136/58 (!) 139/55 132/66   Pulse: 86 96 81   Resp: '16 19 16   '$ Temp: 98.3 F (36.8 C) 98.5 F (36.9 C) 98.5 F (36.9 C)   TempSrc: Oral Oral Oral   SpO2: 97% 95% 94%   Weight:    121.9 kg  Height:        Intake/Output Summary (Last 24 hours) at 12/03/2022 0740 Last data filed at 12/03/2022 0400 Gross per 24 hour  Intake 230 ml  Output 2550 ml  Net -2320 ml      12/03/2022    3:59 AM 12/02/2022    5:00 AM 12/01/2022  2:24 PM  Last 3 Weights  Weight (lbs) 268 lb 11.9 oz 268 lb 1.3 oz 272 lb 0.8 oz  Weight (kg) 121.9 kg 121.6 kg 123.4 kg     Body mass index is 40.86 kg/m.  General:  Well nourished, well developed, morbid obesity HEENT: facial bruising noted Cardiac:  normal S1, S2; RRR; no murmur  Lungs:  clear to auscultation bilaterally, no wheezing, rhonchi or rales  Abd: soft, nontender, no hepatomegaly  Ext:  notable lymphedema of bilateral lower extremities; tender with pitting but improved; there are chronic venous statis changes earlier. Musculoskeletal:  No deformities, BUE and BLE strength normal and equal Skin: warm and dry  Psych:  Normal affect     Cardiac Studies & Procedures       ECHOCARDIOGRAM  ECHOCARDIOGRAM LIMITED 11/29/2022  Narrative ECHOCARDIOGRAM LIMITED REPORT    Patient Name:   [name] Date of Exam: 11/29/2022 Medical Rec #:  [id number]    Height:       68.0 in Accession #:    [id number]   Weight:       289.0 lb Date of Birth:  [birth year]     BSA:          2.390 m Patient Age:    71 years     BP:           149/79 mmHg Patient Gender: F            HR:           80 bpm. Exam Location:  Inpatient  Procedure: Limited Echo and Limited Color Doppler  Indications:    CHF-Acute Diastolic [id number]  History:        Patient has prior history of Echocardiogram examinations, most recent 11/08/2021. CHF, Arrythmias:Atrial Fibrillation, Signs/Symptoms:Shortness of Breath; Risk Factors:Sleep Apnea. 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  Sonographer:    Ronny Flurry Referring Phys: [id number] Venetie A Dore Oquin  IMPRESSIONS   1. Left ventricular ejection fraction, by estimation, is 55 to 60%. The left ventricle has normal function. Left ventricular endocardial border not optimally defined to evaluate regional wall motion. There is mild concentric left ventricular hypertrophy. Left ventricular diastolic function could not be evaluated. There is the interventricular septum is flattened in systole and diastole, consistent with right ventricular pressure and volume overload. 2. Right ventricular systolic function is moderately reduced. The right ventricular size is severely enlarged. There is mildly elevated pulmonary artery systolic pressure. The estimated right ventricular systolic pressure is [id number] mmHg. 3. Left atrial  size was mildly dilated. 4. Right atrial size was mild to moderately dilated. 5. The mitral valve is grossly normal. Mild to moderate mitral valve regurgitation. No evidence of mitral stenosis. 6. The tricuspid valve is abnormal. Tricuspid valve regurgitation is severe. 7. The aortic valve is tricuspid. Aortic valve regurgitation is not visualized. 8. The inferior vena cava is dilated in size with <50% respiratory variability, suggesting right atrial pressure of 15 mmHg.  Comparison(s): Changes from prior study are noted. RV now severely enlarged with moderately reduced function. Severe TR.  Conclusion(s)/Recommendation(s): Findings consistent with Cor Pulmonale.  FINDINGS Left Ventricle: Left ventricular ejection fraction, by estimation, is 55 to 60%. The left ventricle has normal function. Left ventricular endocardial border not optimally defined to evaluate regional wall motion. The left ventricular internal cavity size was normal in size. There is mild concentric left ventricular hypertrophy. The interventricular septum is flattened in systole and diastole, consistent with right  ventricular pressure and volume overload. Left ventricular diastolic function could not be evaluated. Left ventricular diastolic function could not be evaluated due to atrial fibrillation.  Right Ventricle: The right ventricular size is severely enlarged. No increase in right ventricular wall thickness. Right ventricular systolic function is moderately reduced. There is mildly elevated pulmonary artery systolic pressure. The tricuspid regurgitant velocity is 2.47 m/s, and with an assumed right atrial pressure of 15 mmHg, the estimated right ventricular systolic pressure is [id number] mmHg.  Left Atrium: Left atrial size was mildly dilated.  Right Atrium: Right atrial size was mild to moderately dilated.  Pericardium: Trivial pericardial effusion is present.  Mitral Valve: The mitral valve is grossly normal. Mild to  moderate mitral valve regurgitation. No evidence of mitral valve stenosis.  Tricuspid Valve: The tricuspid valve is abnormal. Tricuspid valve regurgitation is severe. No evidence of tricuspid stenosis.  Aortic Valve: The aortic valve is tricuspid. Aortic valve regurgitation is not visualized.  Aorta: The aortic root and ascending aorta are structurally normal, with no evidence of dilitation.  Venous: The inferior vena cava is dilated in size with less than 50% respiratory variability, suggesting right atrial pressure of 15 mmHg.  IAS/Shunts: The atrial septum is grossly normal.  LEFT VENTRICLE PLAX 2D LVIDd:         4.50 cm   Diastology LVIDs:         3.00 cm   LV e' medial:  9.36 cm/s LV PW:         1.50 cm   LV e' lateral: 14.30 cm/s LV IVS:        1.20 cm LVOT diam:     1.90 cm LVOT Area:     2.84 cm   RIGHT VENTRICLE             IVC RV S prime:     11.30 cm/s  IVC diam: 3.60 cm TAPSE (M-mode): 1.4 cm  LEFT ATRIUM         Index LA diam:    4.50 cm 1.88 cm/m  AORTA Ao Root diam: 3.20 cm Ao Asc diam:  2.90 cm  TRICUSPID VALVE TR Peak grad:   24.4 mmHg TR Vmax:        247.00 cm/s  SHUNTS Systemic Diam: 1.90 cm  Eleonore Chiquito MD Electronically signed by Eleonore Chiquito MD Signature Date/Time: 11/29/2022/12:09:56 PM    Final             Laboratory Data:  High Sensitivity Troponin:  No results for input(s): "TROPONINIHS" in the last 720 hours.   Chemistry Recent Labs  Lab 11/27/22 2322 11/29/22 0829 12/02/22 0708  NA 137 139 139  K 4.1 4.6 3.7  CL 102 103 99  CO2 '25 27 27  '$ GLUCOSE 130* 97 93  BUN 25* 23 25*  CREATININE 1.30* 1.27* 1.49*  CALCIUM 9.5 9.4 9.3  MG  --  2.1 1.8  GFRNONAA 41* 42* 35*  ANIONGAP '10 9 13    '$ Recent Labs  Lab 11/27/22 2322 12/02/22 0708  PROT 7.5 6.0*  ALBUMIN 3.9 2.8*  AST 24 18  ALT 15 12  ALKPHOS 81 63  BILITOT 3.9* 4.1*   Lipids No results for input(s): "CHOL", "TRIG", "HDL", "LABVLDL", "LDLCALC", "CHOLHDL" in  the last 168 hours.  Hematology Recent Labs  Lab 11/27/22 2322 11/29/22 0829 12/02/22 0708  WBC 7.0 7.0 6.3  RBC 4.23 3.83* 3.83*  HGB 13.3 12.1 12.2  HCT 41.7 37.3 36.7  MCV 98.6 97.4 95.8  MCH 31.4 31.6 31.9  MCHC 31.9 32.4 33.2  RDW 15.5 15.7* 15.4  PLT 131* 123* 126*   Thyroid  Recent Labs  Lab 12/01/22 0459  TSH 3.248    BNP Recent Labs  Lab 11/28/22 1544  BNP 310.4*    DDimer No results for input(s): "DDIMER" in the last 168 hours.   Radiology/Studies:  CT HEAD WO CONTRAST (5MM)  Result Date: 11/30/2022 CLINICAL DATA:  Intracranial hemorrhage EXAM: CT HEAD WITHOUT CONTRAST TECHNIQUE: Contiguous axial images were obtained from the base of the skull through the vertex without intravenous contrast. RADIATION DOSE REDUCTION: This exam was performed according to the departmental dose-optimization program which includes automated exposure control, adjustment of the mA and/or kV according to patient size and/or use of iterative reconstruction technique. COMPARISON:  CT head 1 day prior. FINDINGS: Brain: The hemorrhagic contusion in the left occipital lobe measuring proximally 1.2 cm in the axial plane is unchanged in size. Small volume subarachnoid hemorrhage in the surrounding sulci is also not significantly changed. The subdural hematoma overlying the occipital measuring up to 9 mm in maximal thickness in the axial plane is not significantly changed in size when remeasured using similar technique. Small volume blood layering along the left aspect of the falx (5-54) and over the parietal lobe more superiorly (5-53) is unchanged. There is mild edema in the left occipital lobe parenchyma without significant mass effect or midline shift. There is no new hemorrhage. There is no evidence of acute territorial infarct. The ventricles are stable in size. No intraventricular hemorrhage is seen. The pituitary and suprasellar region are normal. Vascular: There is calcification of the bilateral  carotid siphons. Skull: Normal. Negative for fracture or focal lesion. Sinuses/Orbits: The paranasal sinuses are clear. The globes and orbits are unremarkable. IMPRESSION: Stable multicompartmental hemorrhage within and overlying the left parietal and occipital lobes as above with mild parenchymal edema but no significant mass effect or midline shift. Electronically Signed   By: Valetta Mole M.D.   On: 11/30/2022 12:55   ECHOCARDIOGRAM LIMITED  Result Date: 11/29/2022    ECHOCARDIOGRAM LIMITED REPORT   Patient Name:   [name] Date of Exam: 11/29/2022 Medical Rec #:  [id number]    Height:       68.0 in Accession #:    [id number]   Weight:       289.0 lb Date of Birth:  02/09/1941     BSA:          2.390 m Patient Age:    63 years     BP:           149/79 mmHg Patient Gender: F            HR:           80 bpm. Exam Location:  Inpatient Procedure: Limited Echo and Limited Color Doppler Indications:    CHF-Acute Diastolic [id number]  History:        Patient has prior history of Echocardiogram examinations, most                 recent 11/08/2021. CHF, Arrythmias:Atrial Fibrillation,                 Signs/Symptoms:Shortness of Breath; Risk Factors:Sleep Apnea.                 Varicose veins of right lower extremity with complications                 Chronic renal disease, stage  3.  Sonographer:    Ronny Flurry Referring Phys: [id number] Calloway A Zelia Yzaguirre IMPRESSIONS  1. Left ventricular ejection fraction, by estimation, is 55 to 60%. The left ventricle has normal function. Left ventricular endocardial border not optimally defined to evaluate regional wall motion. There is mild concentric left ventricular hypertrophy. Left ventricular diastolic function could not be evaluated. There is the interventricular septum is flattened in systole and diastole, consistent with right ventricular pressure and volume overload.  2. Right ventricular systolic function is moderately reduced. The right ventricular size is severely  enlarged. There is mildly elevated pulmonary artery systolic pressure. The estimated right ventricular systolic pressure is A999333 mmHg.  3. Left atrial size was mildly dilated.  4. Right atrial size was mild to moderately dilated.  5. The mitral valve is grossly normal. Mild to moderate mitral valve regurgitation. No evidence of mitral stenosis.  6. The tricuspid valve is abnormal. Tricuspid valve regurgitation is severe.  7. The aortic valve is tricuspid. Aortic valve regurgitation is not visualized.  8. The inferior vena cava is dilated in size with <50% respiratory variability, suggesting right atrial pressure of 15 mmHg. Comparison(s): Changes from prior study are noted. RV now severely enlarged with moderately reduced function. Severe TR. Conclusion(s)/Recommendation(s): Findings consistent with Cor Pulmonale. FINDINGS  Left Ventricle: Left ventricular ejection fraction, by estimation, is 55 to 60%. The left ventricle has normal function. Left ventricular endocardial border not optimally defined to evaluate regional wall motion. The left ventricular internal cavity size was normal in size. There is mild concentric left ventricular hypertrophy. The interventricular septum is flattened in systole and diastole, consistent with right ventricular pressure and volume overload. Left ventricular diastolic function could not be evaluated. Left ventricular diastolic function could not be evaluated due to atrial fibrillation. Right Ventricle: The right ventricular size is severely enlarged. No increase in right ventricular wall thickness. Right ventricular systolic function is moderately reduced. There is mildly elevated pulmonary artery systolic pressure. The tricuspid regurgitant velocity is 2.47 m/s, and with an assumed right atrial pressure of 15 mmHg, the estimated right ventricular systolic pressure is [id number] mmHg. Left Atrium: Left atrial size was mildly dilated. Right Atrium: Right atrial size was mild to moderately  dilated. Pericardium: Trivial pericardial effusion is present. Mitral Valve: The mitral valve is grossly normal. Mild to moderate mitral valve regurgitation. No evidence of mitral valve stenosis. Tricuspid Valve: The tricuspid valve is abnormal. Tricuspid valve regurgitation is severe. No evidence of tricuspid stenosis. Aortic Valve: The aortic valve is tricuspid. Aortic valve regurgitation is not visualized. Aorta: The aortic root and ascending aorta are structurally normal, with no evidence of dilitation. Venous: The inferior vena cava is dilated in size with less than 50% respiratory variability, suggesting right atrial pressure of 15 mmHg. IAS/Shunts: The atrial septum is grossly normal. LEFT VENTRICLE PLAX 2D LVIDd:         4.50 cm   Diastology LVIDs:         3.00 cm   LV e' medial:  9.36 cm/s LV PW:         1.50 cm   LV e' lateral: 14.30 cm/s LV IVS:        1.20 cm LVOT diam:     1.90 cm LVOT Area:     2.84 cm  RIGHT VENTRICLE             IVC RV S prime:     11.30 cm/s  IVC diam: 3.60 cm TAPSE (M-mode): 1.4 cm LEFT ATRIUM  Index LA diam:    4.50 cm 1.88 cm/m   AORTA Ao Root diam: 3.20 cm Ao Asc diam:  2.90 cm TRICUSPID VALVE TR Peak grad:   24.4 mmHg TR Vmax:        247.00 cm/s  SHUNTS Systemic Diam: 1.90 cm Eleonore Chiquito MD Electronically signed by Eleonore Chiquito MD Signature Date/Time: 11/29/2022/12:09:56 PM    Final      Assessment and Plan:   Pulmonary Hypertension HFpEF (acute on chronic) Tricuspid regurgitation - WHO Functional Class unclear but asymptomatic at rest, Stage C, hypervolemic, WHO II & III in the setting of Morbid Obesity, HFpEF, Untreated OSA - SGLT2i added at last admission - continue MRA - she is still hypervolemic; I suspect we are unmasking CKD - Diuretic regimen: I have switched her to oral torsemide - daily BMP until creatinine stablizes - we are hoping to find the balance of CKD and euvolemica - Fluid restriction of < 2 L  - planned for conservative inpatient  diagnostics; defer High res CT, PFTs, Hepatic Duplex as outpatient; defer RHC for now; will need outpatient f/u with sleep medicine; would be reasonable outpatient GLP-1 therapy candidate - Normal TSH   HTN - MRA &  ARB   Permanent AF complicated by [facility] - Continue BB for now - DOAC when cleared by NSG      Risk Assessment/Risk Scores:        New York Heart Association (NYHA) Functional Class NYHA Class II  CHA2DS2-VASc Score = 5   This indicates a 7.2% annual risk of stroke. The patient's score is based upon: CHF History: 1 HTN History: 1 Diabetes History: 0 Stroke History: 0 Vascular Disease History: 0 Age Score: 2 Gender Score: [place], [place] FASE [place]  [place] [place], [place] [address] [phone]  7:44 AM

## 2022-12-03 NOTE — Progress Notes (Signed)
Physical Therapy Session Note  Patient Details  Name: [name] MRN: [id number] Date of Birth: Jul 20, 1941  Today's Date: 12/03/2022 PT Individual Time: 1100-1155 PT Individual Time Calculation (min): 55 min   Short Term Goals: Week 1:  PT Short Term Goal 1 (Week 1): Pt will complete bed mobility with CGA. PT Short Term Goal 2 (Week 1): Pt will complete bed to chair transfer consistently with CGA. PT Short Term Goal 3 (Week 1): Pt will ambulate x50' with minA and LRAD. PT Short Term Goal 4 (Week 1): Pt will complete x8 steps with bilateral hand rails and minA.  Skilled Therapeutic Interventions/Progress Updates:   Received pt sitting in WC, pt agreeable to PT treatment, and denied any pain during session - just complained of dizziness (baseline). Session with emphasis on functional mobility/transfers, generalized strengthening and endurance, and gait training. Pt transported to/from room in [facility] for time management purposes. Stood from [facility] with bari RW and min A and ambulated 95f with bari RW and CGA with 1 standing rest break due to dizziness. Took seated rest break and attempted to read wall in dayroom - pt skipping some words and having difficulty reading smaller sizes letters.   [facility] [employer] and CGA and ambulated additional 139fwith bari RW and CGA - pt required 5 standing rest breaks this time due to increased dizziness and feeling like she needs to stop walking when other people walk by to avoid exacerbating dizziness. Of note, pt extremely fearful of falling and anxious, requesting to have close WC follow while ambulating. Pt with episode of urinary incontinence while ambulating, exclaiming "I gotta pee, it's coming!" Reminded pt she has brief on, [facility]. Stood with bari RW and CGA and removed pants and soiled brief - purewick came out. Notified NT and NT arrived to place purewick while PT provided CGA while pt stood  and assisted with donning clean brief. Concluded session with pt sitting in WC, needs within reach, and seatbelt alarm on.   Therapy Documentation Precautions:  Precautions Precautions: Fall Restrictions Weight Bearing Restrictions: No  Therapy/Group: Individual Therapy AnAlfonse AlpersT, DPT  12/03/2022, 7:15 AM

## 2022-12-03 NOTE — Progress Notes (Signed)
PROGRESS NOTE   Subjective/Complaints:  Pt without complaints. Denies pain.   ROS: Patient denies fever, rash, sore throat, blurred vision, dizziness, nausea, vomiting, diarrhea, cough, shortness of breath or chest pain, joint or back/neck pain, headache, or mood change.   Objective:   No results found. Recent Labs    12/02/22 0708  WBC 6.3  HGB 12.2  HCT 36.7  PLT 126*   Recent Labs    12/02/22 0708  NA 139  K 3.7  CL 99  CO2 27  GLUCOSE 93  BUN 25*  CREATININE 1.49*  CALCIUM 9.3    Intake/Output Summary (Last 24 hours) at 12/03/2022 0852 Last data filed at 12/03/2022 0746 Gross per 24 hour  Intake 470 ml  Output 2550 ml  Net -2080 ml        Physical Exam: Vital Signs Blood pressure 132/66, pulse 81, temperature 98.5 F (36.9 C), temperature source Oral, resp. rate 16, height '5\' 8"'$  (1.727 m), weight 121.9 kg, last menstrual period 02/07/2012, SpO2 94 %. Constitutional: No distress . Vital signs reviewed. HEENT: NCAT, EOMI, oral membranes moist Neck: supple Cardiovascular: RRR without murmur. No JVD    Respiratory/Chest: CTA Bilaterally without wheezes or rales. Normal effort    GI/Abdomen: BS +, non-tender, non-distended Ext: no clubbing, cyanosis, or edema Psych: pleasant and cooperative  Skin: Multiple bruises on bilateral cheeks, arm, and knees.  Chronic venous stasis changed BL LE. Raised polyps on L shin and medial ankle.   New L medial thigh erythema, non-tender, no warmth or fluctuance. Marked with pen---stable appearing   MSK:      No apparent deformity.      Strength: BL UE 4/5 grossly LLE 3/5 HF, 3/5 KE, 4/5 DF, 4/5 PF--unchanged RLE 4/5 HF, 4/5 KE, 5/5 DF, 5/5 PF--unchanged  Neurologic exam:  DTRs: Reflexes were 2+ in bilateral Ues; negative in BL patella d/t TKR. Babinsky: flexor responses b/l.   Hoffmans: negative b/l Sensory exam: revealed normal sensation in all dermatomal  regions in bilateral UE and LLE; RLE sensory deficit in distal plantar foot to mid-shin. Coordination: + Ataxia on FTN in bilateral UE, L>R Mild STM deficits, sl decreased insight/decision  making speed     Assessment/Plan: 1. Functional deficits which require 3+ hours per day of interdisciplinary therapy in a comprehensive inpatient rehab setting. Physiatrist is providing close team supervision and 24 hour management of active medical problems listed below. Physiatrist and rehab team continue to assess barriers to discharge/monitor patient progress toward functional and medical goals  Care Tool:  Bathing    Body parts bathed by patient: Right arm, Left arm, Chest, Abdomen, Right upper leg, Left upper leg, Face   Body parts bathed by helper: Front perineal area, Buttocks, Right lower leg, Left lower leg     Bathing assist Assist Level: 2 Helpers     Upper Body Dressing/Undressing Upper body dressing   What is the patient wearing?: Pull over shirt, Bra    Upper body assist Assist Level: Minimal Assistance - Patient > 75%    Lower Body Dressing/Undressing Lower body dressing      What is the patient wearing?: Underwear/pull up, Pants  Lower body assist Assist for lower body dressing: 2 Helpers     Toileting Toileting    Toileting assist Assist for toileting: 2 Helpers     Transfers Chair/bed transfer  Transfers assist     Chair/bed transfer assist level: 2 Helpers     Locomotion Ambulation   Ambulation assist      Assist level: 2 helpers Assistive device: Walker-rolling Max distance: 26'   Walk 10 feet activity   Assist     Assist level: 2 helpers Assistive device: Walker-rolling   Walk 50 feet activity   Assist Walk 50 feet with 2 turns activity did not occur: Safety/medical concerns         Walk 150 feet activity   Assist Walk 150 feet activity did not occur: Safety/medical concerns         Walk 10 feet on uneven surface   activity   Assist Walk 10 feet on uneven surfaces activity did not occur: Safety/medical concerns         Wheelchair     Assist Is the patient using a wheelchair?: Yes Type of Wheelchair: Manual    Wheelchair assist level: Dependent - Patient 0% Max wheelchair distance: 150'    Wheelchair 50 feet with 2 turns activity    Assist        Assist Level: Dependent - Patient 0%   Wheelchair 150 feet activity     Assist      Assist Level: Dependent - Patient 0%   Blood pressure 132/66, pulse 81, temperature 98.5 F (36.9 C), temperature source Oral, resp. rate 16, height '5\' 8"'$  (1.727 m), weight 121.9 kg, last menstrual period 02/07/2012, SpO2 94 %. Medical Problem List and Plan: 1. 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  Antithrombotics: -DVT/anticoagulation:  Mechanical:  Antiembolism stockings, knee (TED hose) Bilateral lower extremities>>patient states she refuses compression stockings; can ACE wrap when OOB  - Was on Eliquis 5 mg BID PTA, Will need to clear resumption with Dr. Christella Noa for discharge vs. OP with repeat imaging             - antiplatelet therapy: none   3. Pain Management: Tylenol as needed   4. Mood/Behavior/Sleep: LCSW to evaluate and provide emotional support             -antipsychotic agents: n/a   5. Neuropsych/cognition: This patient is capable of making decisions on her own behalf.   6. Skin/Wound Care: Routine skin care checks   - 3/9: ?cellulitis LLE --area without significant change   -continue observation for now   7. Fluids/Electrolytes/Nutrition: Strict Is and Os and follow-up chemistries             -fluid restriction <2 liters             -continue KCl             -continue B12   - admission labs mag, k stable   - Decreasing PO mag from 800 mg  BID to Daily 3/9; repeat on Monday   8: Hypertension: monitor TID and prn      - bp controlled, monitor closely for s/s orthostasis      12/03/2022  3:59 AM 12/03/2022    3:27 AM 12/02/2022    8:02 PM  Vitals with BMI  Weight 268 lbs 12 oz    BMI [id number]    Systolic  [id number]  Diastolic  66 55  Pulse  81 96      9: GERD: continue Protonix   10: Iron deficiency:continue ferrous sulfate 325 mg BID   11: 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:              -fluid restriction <2 liters             -daily weight (dry weight 270 lbs per OP meds)             -continue Jardiance 10 mg daily             -continue spironolactone 25 mg daily             -continue irbesartan 150 mg daily             -continue magnesium oxide 400 mg BID -> daily 3/9             -continue KCl 10 mEq daily Filed Weights   12/01/22 1424 12/02/22 0500 12/03/22 0359  Weight: 123.4 kg 121.6 kg 121.9 kg   -3/9  weights appear to be down-trending, now on lasix '40mg'$  bid  -appreciate cards f/u     12: Chronic BLE edema/lymphedema:              -Unable to tolerate TED hose; may do well with ACE wrapping, d/w therapies for AM             - Would benefit from OP lymphedema therapy and potentially lymphedema pumps   13: Permanent atrial fibrillation:             -continue Lopressor 50 mg BID             -DOAC when cleared by NS   14: OSA; does not use CPAP             -sleep medicine follow-up as outpatient   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    16. Vertigo/Dizziness.    - Vestibular eval ordered - still pending - Meclizine 12.5 mg TID PRN     LOS: 2 days A FACE TO FACE EVALUATION WAS PERFORMED  Meredith Staggers 12/03/2022, 8:52 AM

## 2022-12-03 NOTE — Progress Notes (Signed)
Occupational Therapy TBI Note  Patient Details  Name: [name] MRN: [id number] Date of Birth: 05-27-1941  Today's Date: 12/03/2022 OT Individual Time: 0930-1030 OT Individual Time Calculation (min): 60 min   Today's Date: 12/03/2022 OT Individual Time: [id number] OT Individual Time Calculation (min): 30 min  Short Term Goals: Week 1:  OT Short Term Goal 1 (Week 1): Pt will stand to groom at sink to demo improved activity tolerance OT Short Term Goal 2 (Week 1): Pt will complete SPT wiht LRAD and MIN A OT Short Term Goal 3 (Week 1): Pt will complete 2/3 steps of toileting with MIN A for balance OT Short Term Goal 4 (Week 1): Pt will be A&O x 4 with compensatory strategies PRN  Skilled Therapeutic Interventions/Progress Updates:     Pt received in bedno pain but agreeable to shower  ADL: Pt completes ADL at overall mod A Level. Skilled interventions include: cuing for hand placement RW management and posture during transfers, MOD A initially to power up but then improving to MIN A through repetition, pt able ot wash seated with lateral leans for posterior hyeigne. Would benefit from LHSS and AE education. BM in toilet with MAX A for hygiene. UB dressing/grooming with set up. Total A for LB dressing d/t time constraints  Pt left at end of session in bed with exit alarm on, call light in reach and all needs met  Session 2:  Pt received in bed with no pain. Son in Sports coach and daughter present. Education on role/purpose of OT and CLOF. Discussed future vestibular evaluation to determine if cause of dizziness  Therapeutic activity 50 feet functional mobility with RW and cuing for forward gaze, longer steps and reduction in shuffling.   Visual scanning with mild R inattention noted 1-20 with 1UE each with pt often undershooting or reaching just below 2 min 20-2 min 40 seconds  Visual pursuits 1-20 with either UE 3 min and 20 seconds.  Pt left at end of session in bed with exit alarm on, call  light in reach and all needs met   Therapy Documentation Precautions:  Precautions Precautions: Fall Restrictions Weight Bearing Restrictions: No General:    Agitated Behavior Scale: TBI  Observation Details Observation Environment: CIR Start of observation period - Date: 12/03/22 Start of observation period - Time: 1235 End of observation period - Date: 12/03/22 End of observation period - Time: 1321 Agitated Behavior Scale (DO NOT LEAVE BLANKS) Short attention span, easy distractibility, inability to concentrate: Present to a slight degree Impulsive, impatient, low tolerance for pain or frustration: Absent Uncooperative, resistant to care, demanding: Absent Violent and/or threatening violence toward people or property: Absent Explosive and/or unpredictable anger: Absent Rocking, rubbing, moaning, or other self-stimulating behavior: Absent Pulling at tubes, restraints, etc.: Absent Wandering from treatment areas: Absent Restlessness, pacing, excessive movement: Absent Repetitive behaviors, motor, and/or verbal: Absent Rapid, loud, or excessive talking: Absent Sudden changes of mood: Absent Easily initiated or excessive crying and/or laughter: Absent Self-abusiveness, physical and/or verbal: Absent Agitated behavior scale total score: 15   Therapy/Group: Individual Therapy  Tonny Branch 12/03/2022, 6:13 AM

## 2022-12-04 DIAGNOSIS — I609 Nontraumatic subarachnoid hemorrhage, unspecified: Secondary | ICD-10-CM | POA: Diagnosis not present

## 2022-12-04 DIAGNOSIS — S069XAS Unspecified intracranial injury with loss of consciousness status unknown, sequela: Secondary | ICD-10-CM | POA: Diagnosis not present

## 2022-12-04 DIAGNOSIS — I5043 Acute on chronic combined systolic (congestive) and diastolic (congestive) heart failure: Secondary | ICD-10-CM | POA: Diagnosis not present

## 2022-12-04 DIAGNOSIS — R600 Localized edema: Secondary | ICD-10-CM | POA: Diagnosis not present

## 2022-12-04 DIAGNOSIS — N1831 Chronic kidney disease, stage 3a: Secondary | ICD-10-CM | POA: Diagnosis not present

## 2022-12-04 LAB — BASIC METABOLIC PANEL
Anion gap: 12 (ref 5–15)
BUN: 25 mg/dL — ABNORMAL HIGH (ref 8–23)
CO2: 30 mmol/L (ref 22–32)
Calcium: 9.2 mg/dL (ref 8.9–10.3)
Chloride: 97 mmol/L — ABNORMAL LOW (ref 98–111)
Creatinine, Ser: 1.48 mg/dL — ABNORMAL HIGH (ref 0.44–1.00)
GFR, Estimated: 35 mL/min — ABNORMAL LOW (ref >60)
Glucose, Bld: 93 mg/dL (ref 70–99)
Potassium: 3.7 mmol/L (ref 3.5–5.1)
Sodium: 139 mmol/L (ref 135–145)

## 2022-12-04 MED ORDER — MELATONIN 3 MG PO TABS
3.0000 mg | ORAL_TABLET | Freq: Every day | ORAL | Status: DC
  Administered 2022-12-04 – 2022-12-12 (×9): 3 mg via ORAL
  Filled 2022-12-04 (×9): qty 1

## 2022-12-04 MED ORDER — TORSEMIDE 20 MG PO TABS
40.0000 mg | ORAL_TABLET | Freq: Every day | ORAL | Status: DC
  Administered 2022-12-05 – 2022-12-13 (×9): 40 mg via ORAL
  Filled 2022-12-04 (×9): qty 2

## 2022-12-04 NOTE — Progress Notes (Addendum)
PROGRESS NOTE   Subjective/Complaints:  Pt has generalized pain, low back in particular. Has problems falling asleep.   ROS: Patient denies fever, rash, sore throat, blurred vision, dizziness, nausea, vomiting, diarrhea, cough, shortness of breath or chest pain,  headache, or mood change.   Objective:   No results found. Recent Labs    12/02/22 0708  WBC 6.3  HGB 12.2  HCT 36.7  PLT 126*   Recent Labs    12/02/22 0708 12/04/22 0648  NA 139 139  K 3.7 3.7  CL 99 97*  CO2 27 30  GLUCOSE 93 93  BUN 25* 25*  CREATININE 1.49* 1.48*  CALCIUM 9.3 9.2    Intake/Output Summary (Last 24 hours) at 12/04/2022 0817 Last data filed at 12/04/2022 0730 Gross per 24 hour  Intake 480 ml  Output 2200 ml  Net -1720 ml        Physical Exam: Vital Signs Blood pressure 125/64, pulse 84, temperature 98.7 F (37.1 C), temperature source Oral, resp. rate 18, height '5\' 8"'$  (1.727 m), weight 121.1 kg, last menstrual period 02/07/2012, SpO2 91 %. Constitutional: No distress . Vital signs reviewed. HEENT: NCAT, EOMI, oral membranes moist Neck: supple Cardiovascular: RRR with sys murmur. No JVD    Respiratory/Chest: CTA Bilaterally without wheezes or rales. Normal effort    GI/Abdomen: BS +, non-tender, non-distended Ext: no clubbing, cyanosis  Psych: pleasant and cooperative  Skin: Multiple bruises on bilateral cheeks, arm, and knees.  Chronic venous stasis changed BL LE L>R. Raised polyps on L shin and medial ankle.   Area of erythema pictured below has improved/receded. Demonstrates really only chronic changes today. 3/10  MSK:      No apparent deformity.      Strength: BL UE 4/5 grossly LLE 3/5 HF, 3/5 KE, 4/5 DF, 4/5 PF--unchanged RLE 4/5 HF, 4/5 KE, 5/5 DF, 5/5 PF--unchanged  Neurologic exam:  DTRs: Reflexes were 2+ in bilateral Ues; negative in BL patella d/t TKR. Babinsky: flexor responses b/l.   Hoffmans: negative  b/l Sensory exam: revealed normal sensation in all dermatomal regions in bilateral UE and LLE; RLE sensory deficit in distal plantar foot to mid-shin. Coordination: + Ataxia on FTN in bilateral UE, L>R Mild STM deficits, sl decreased insight and processing speed    Assessment/Plan: 1. Functional deficits which require 3+ hours per day of interdisciplinary therapy in a comprehensive inpatient rehab setting. Physiatrist is providing close team supervision and 24 hour management of active medical problems listed below. Physiatrist and rehab team continue to assess barriers to discharge/monitor patient progress toward functional and medical goals  Care Tool:  Bathing    Body parts bathed by patient: Right arm, Left arm, Chest, Abdomen, Right upper leg, Left upper leg, Face   Body parts bathed by helper: Front perineal area, Buttocks, Right lower leg, Left lower leg     Bathing assist Assist Level: 2 Helpers     Upper Body Dressing/Undressing Upper body dressing   What is the patient wearing?: Pull over shirt, Bra    Upper body assist Assist Level: Minimal Assistance - Patient > 75%    Lower Body Dressing/Undressing Lower body dressing  What is the patient wearing?: Underwear/pull up, Pants     Lower body assist Assist for lower body dressing: 2 Helpers     Toileting Toileting    Toileting assist Assist for toileting: 2 Helpers     Transfers Chair/bed transfer  Transfers assist     Chair/bed transfer assist level: 2 Helpers     Locomotion Ambulation   Ambulation assist      Assist level: Contact Guard/Touching assist Assistive device: Walker-rolling Max distance: 46f   Walk 10 feet activity   Assist     Assist level: Contact Guard/Touching assist Assistive device: Walker-rolling   Walk 50 feet activity   Assist Walk 50 feet with 2 turns activity did not occur: Safety/medical concerns         Walk 150 feet activity   Assist Walk  150 feet activity did not occur: Safety/medical concerns         Walk 10 feet on uneven surface  activity   Assist Walk 10 feet on uneven surfaces activity did not occur: Safety/medical concerns         Wheelchair     Assist Is the patient using a wheelchair?: Yes Type of Wheelchair: Manual    Wheelchair assist level: Dependent - Patient 0% Max wheelchair distance: 150'    Wheelchair 50 feet with 2 turns activity    Assist        Assist Level: Dependent - Patient 0%   Wheelchair 150 feet activity     Assist      Assist Level: Dependent - Patient 0%   Blood pressure 125/64, pulse 84, temperature 98.7 F (37.1 C), temperature source Oral, resp. rate 18, height '5\' 8"'$  (1.727 m), weight 121.1 kg, last menstrual period 02/07/2012, SpO2 91 %. Medical Problem List and Plan: 1. 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  Antithrombotics: -DVT/anticoagulation:  Mechanical:  Antiembolism stockings, knee (TED hose) Bilateral lower extremities>>patient states she refuses compression stockings; can ACE wrap when OOB  - Was on Eliquis 5 mg BID PTA, Will need to clear resumption with Dr. CChristella Noafor discharge vs. OP with repeat imaging             - antiplatelet therapy: none   3. Pain Management: Tylenol as needed   4. Mood/Behavior/Sleep: LCSW to evaluate and provide emotional support             -antipsychotic agents: n/a   -will add low dose melatonin tonight for sleep 3/10   -has trazodone prn which hasn't been used  5. Neuropsych/cognition: This patient is capable of making decisions on her own behalf.   6. Skin/Wound Care: Routine skin care checks   - 3/10: ?cellulitis LLE -improved. No signs of active infection   -continue observation for now   7. Fluids/Electrolytes/Nutrition:  Strict Is and Os and follow-up chemistries             -fluid restriction <2 liters             -continue KCl             -continue B12   - admission labs mag, k stable   - Decreasing PO mag from 800 mg  BID to Daily 3/9; repeat on Monday   8: Hypertension: monitor TID and prn      - bp controlled, monitor closely for s/s orthostasis      12/04/2022    5:35 AM 12/04/2022    3:56 AM 12/03/2022    7:47 PM  Vitals with BMI  Weight 267 lbs    BMI [id number]    Systolic  0000000 [id number]  Diastolic  64 55  Pulse  84 89      9: GERD: continue Protonix   10: Iron deficiency:continue ferrous sulfate 325 mg BID   11: 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:              -fluid restriction <2 liters             -daily weight (dry weight 270 lbs per OP meds)             -continue Jardiance 10 mg daily             -continue spironolactone 25 mg daily             -continue irbesartan 150 mg daily             -continue magnesium oxide 400 mg BID -> daily 3/9             -continue KCl 10 mEq daily Filed Weights   12/02/22 0500 12/03/22 0359 12/04/22 0535  Weight: 121.6 kg 121.9 kg 121.1 kg   -3/10 weights appear to be down-trending, now on lasix '40mg'$  bid  -appreciate cards f/u     12: Chronic BLE edema/lymphedema:              -Unable to tolerate TED hose; may do well with ACE wrapping, d/w therapies for AM             - Would benefit from OP lymphedema therapy and potentially lymphedema pumps   -elevate legs while in bed 13: Permanent atrial fibrillation:             -continue Lopressor 50 mg BID             -DOAC when cleared by NS   14: OSA; does not use CPAP             -sleep medicine follow-up as outpatient   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    16. Vertigo/Dizziness.    - Vestibular  eval ordered - still pending - Meclizine 12.5 mg TID PRN     LOS: 3 days A FACE TO FACE EVALUATION WAS PERFORMED  Meredith Staggers 12/04/2022, 8:17 AM

## 2022-12-04 NOTE — Plan of Care (Signed)
  Problem: Consults Goal: RH STROKE PATIENT EDUCATION Description: See Patient Education module for education specifics  Outcome: Progressing   Problem: RH BOWEL ELIMINATION Goal: RH STG MANAGE BOWEL WITH ASSISTANCE Description: STG Manage Bowel with min Assistance. Outcome: Progressing Goal: RH STG MANAGE BOWEL W/MEDICATION W/ASSISTANCE Description: STG Manage Bowel with Medication with min Assistance. Outcome: Progressing   Problem: RH BLADDER ELIMINATION Goal: RH STG MANAGE BLADDER WITH ASSISTANCE Description: STG Manage Bladder With min Assistance Outcome: Progressing   Problem: RH SKIN INTEGRITY Goal: RH STG SKIN FREE OF INFECTION/BREAKDOWN Description: Skin will remain free of infection/breakdown with min assist Outcome: Progressing Goal: RH STG MAINTAIN SKIN INTEGRITY WITH ASSISTANCE Description: STG Maintain Skin Integrity With min Assistance. Outcome: Progressing   Problem: RH SAFETY Goal: RH STG ADHERE TO SAFETY PRECAUTIONS W/ASSISTANCE/DEVICE Description: STG Adhere to Safety Precautions With cueing Assistance/Device. Outcome: Progressing   Problem: RH COGNITION-NURSING Goal: RH STG USES MEMORY AIDS/STRATEGIES W/ASSIST TO PROBLEM SOLVE Description: STG Uses Memory Aids/Strategies With cueing Assistance to Problem Solve. Outcome: Progressing Goal: RH STG ANTICIPATES NEEDS/CALLS FOR ASSIST W/ASSIST/CUES Description: STG Anticipates Needs/Calls for Assist With Mod I Assistance/Cues. Outcome: Progressing   Problem: RH PAIN MANAGEMENT Goal: RH STG PAIN MANAGED AT OR BELOW PT'S PAIN GOAL Description: Pain will be managed at 3 out of 10 on pain scale with PRN medications min assist Outcome: Progressing   Problem: RH KNOWLEDGE DEFICIT Goal: RH STG INCREASE KNOWLEDGE OF HYPERTENSION Description: Patient/ caregiver will be able to manage medications, daily weights and self care from nursing education and nursing handouts independently  Outcome: Progressing

## 2022-12-04 NOTE — IPOC Note (Signed)
Overall Plan of Care [facility]) Patient Details Name: [name] MRN: [id number] DOB: 11/23/1940  Admitting Diagnosis: Traumatic brain injury [facility])  Hospital Problems: Principal Problem:   Traumatic brain injury [facility])     Functional Problem List: Nursing Perception, Bladder, Bowel, Safety, Edema, Endurance, Medication Management, Pain, Nutrition  PT Balance, Behavior, Endurance, Motor, Pain, Safety, Perception, Skin Integrity  OT Balance, Cognition, Endurance, Edema, Pain, Perception, Motor, Safety, Skin Integrity, Vision  SLP Cognition, Linguistic  TR         Basic ADL's: OT Bathing, Grooming, Dressing, Toileting     Advanced  ADL's: OT       Transfers: PT Bed Mobility, Bed to Chair, Car, Manufacturing systems engineer, Metallurgist: PT Ambulation, Stairs     Additional Impairments: OT Fuctional Use of Upper Extremity  SLP Communication, Social Cognition expression Problem Solving, Memory, Attention  TR      Anticipated Outcomes Item Anticipated Outcome  Self Feeding S  Swallowing      Basic self-care  S  Toileting  s   Bathroom Transfers S  Bowel/Bladder  continent B/B or return to normal PTA  Transfers  Supervision  Locomotion  Supervision  Communication  sup A  Cognition  sup-to-min A  Pain  less than 3  Safety/Judgment  remain fall free while in rehab   Therapy Plan: PT Intensity: Minimum of 1-2 x/day ,45 to 90 minutes PT Frequency: 5 out of 7 days PT Duration Estimated Length of Stay: 2-2.5 weeks OT Intensity: Minimum of 1-2 x/day, 45 to 90 minutes OT Frequency: 5 out of 7 days OT Duration/Estimated Length of Stay: ~2.5 weeks SLP Intensity: Minumum of 1-2 x/day, 30 to 90 minutes SLP Frequency: 3 to 5 out of 7 days SLP Duration/Estimated Length of Stay: 2-2.5 weeks   Team Interventions: Nursing Interventions Patient/Family Education, Pain Management, Bladder Management, Medication Management, Discharge Planning, Bowel Management,  Disease Management/Prevention, Cognitive Remediation/Compensation  PT interventions Ambulation/gait training, Community reintegration, DME/adaptive equipment instruction, Neuromuscular re-education, Psychosocial support, Stair training, UE/LE Strength taining/ROM, Training and development officer, Discharge planning, Functional electrical stimulation, Pain management, Skin care/wound management, Therapeutic Activities, UE/LE Coordination activities, Cognitive remediation/compensation, Disease management/prevention, Functional mobility training, Patient/family education, Splinting/orthotics, Therapeutic Exercise, Visual/perceptual remediation/compensation, Wheelchair propulsion/positioning  OT Interventions Balance/vestibular training, Discharge planning, Pain management, Self Care/advanced ADL retraining, Therapeutic Activities, UE/LE Coordination activities, Visual/perceptual remediation/compensation, Therapeutic Exercise, Skin care/wound managment, Patient/family education, Functional mobility training, Disease mangement/prevention, Cognitive remediation/compensation, Community reintegration, Engineer, drilling, Neuromuscular re-education, Psychosocial support, Splinting/orthotics, UE/LE Strength taining/ROM, Wheelchair propulsion/positioning  SLP Interventions Cognitive remediation/compensation, Internal/external aids, Speech/Language facilitation, Functional tasks, Patient/family education, Therapeutic Activities  TR Interventions    SW/CM Interventions Discharge Planning, Psychosocial Support, Patient/Family Education   Barriers to Discharge MD  Medical stability  Nursing Decreased caregiver support, Home environment access/layout, Incontinence, Lack of/limited family support, Insurance for SNF coverage, Weight, Medication compliance multi level home with 1 ste, no rails.  Caregivers needed vs SNF placement  PT Decreased caregiver support, Home environment access/layout    OT  Inaccessible home environment, Decreased caregiver support, Lack of/limited family support    SLP Lack of/limited family support    SW Decreased caregiver support, Lack of/limited family support     Team Discharge Planning: Destination: PT-Home ,OT- Home , SLP-Home Projected Follow-up: PT-Home health PT, 24 hour supervision/assistance, OT-  Home health OT, SLP-24 hour supervision/assistance, Home Health SLP Projected Equipment Needs: PT-To be determined, OT- To be determined, SLP-None recommended by SLP Equipment Details: PT- , OT-  Patient/family involved in discharge planning: PT- Patient, Family member/caregiver,  OT-Patient, SLP-Patient  MD ELOS: 14-18 days Medical Rehab Prognosis:  Excellent Assessment: The patient has been admitted for CIR therapies with the diagnosis of TBI. The team will be addressing functional mobility, strength, stamina, balance, safety, adaptive techniques and equipment, self-care, bowel and bladder mgt, patient and caregiver education, NMR, cognition, pain mgt, community reentry. Goals have been set at supervision for mobility, self-care and sup/min A cognition. Anticipated discharge destination is home.        See Team Conference Notes for weekly updates to the plan of care

## 2022-12-04 NOTE — Progress Notes (Signed)
Progress Note  Patient Name: [name] Date of Encounter: 12/04/2022  Primary Cardiologist: Carlyle Dolly, MD   Subjective   Creatinine is stable  Patient notes more strength. She is over 30 pounds lighter.  Inpatient Medications    Scheduled Meds:  cyanocobalamin  1,000 mcg Oral Daily   docusate sodium  100 mg Oral BID   empagliflozin  10 mg Oral Daily   ferrous sulfate  325 mg Oral BID WC   irbesartan  150 mg Oral Daily   magnesium oxide  400 mg Oral Daily   metoprolol tartrate  50 mg Oral BID   pantoprazole  80 mg Oral Daily   potassium chloride SA  40 mEq Oral Daily   senna  1 tablet Oral Daily   spironolactone  25 mg Oral Daily   torsemide  40 mg Oral BID   Continuous Infusions:  sodium chloride 10 mL/hr at 12/01/22 1618   PRN Meds: acetaminophen, alum & mag hydroxide-simeth, calcium carbonate, guaiFENesin-dextromethorphan, meclizine, methocarbamol, polyethylene glycol, prochlorperazine **OR** prochlorperazine **OR** prochlorperazine, sodium phosphate, sorbitol, traZODone   Vital Signs    Vitals:   12/03/22 1737 12/03/22 1947 12/04/22 0356 12/04/22 0535  BP:  (!) 125/55 125/64   Pulse:  89 84   Resp:  18 18   Temp: 98.6 F (37 C) 98.3 F (36.8 C) 98.7 F (37.1 C)   TempSrc: Oral Oral Oral   SpO2:  94% 91%   Weight:    121.1 kg  Height:        Intake/Output Summary (Last 24 hours) at 12/04/2022 0759 Last data filed at 12/04/2022 0730 Gross per 24 hour  Intake 480 ml  Output 2200 ml  Net -1720 ml   Filed Weights   12/02/22 0500 12/03/22 0359 12/04/22 0535  Weight: 121.6 kg 121.9 kg 121.1 kg    ECG    No new - Personally Reviewed  Physical Exam   Gen: No distress, Morbid Obesity   Neck: Thick neck Cardiac: No Rubs or Gallops, systolic murmur, IRIR rhythm; no RV heave Respiratory: Clear to auscultation bilaterally, normal effort, normal respiratory rate GI: Soft, nontender, non-distended  MS: painless bilateral edema;  moves all  extremities Integument: chronic venous changes Psych: Normal affect, patient feels well   Labs    Chemistry Recent Labs  Lab 11/27/22 2322 11/29/22 0829 12/02/22 0708 12/04/22 0648  NA 137 139 139 139  K 4.1 4.6 3.7 3.7  CL 102 103 99 97*  CO2 '25 27 27 30  '$ GLUCOSE 130* 97 93 93  BUN 25* 23 25* 25*  CREATININE 1.30* 1.27* 1.49* 1.48*  CALCIUM 9.5 9.4 9.3 9.2  PROT 7.5  --  6.0*  --   ALBUMIN 3.9  --  2.8*  --   AST 24  --  18  --   ALT 15  --  12  --   ALKPHOS 81  --  63  --   BILITOT 3.9*  --  4.1*  --   GFRNONAA 41* 42* 35* 35*  ANIONGAP '10 9 13 12     '$ Hematology Recent Labs  Lab 11/27/22 2322 11/29/22 0829 12/02/22 0708  WBC 7.0 7.0 6.3  RBC 4.23 3.83* 3.83*  HGB 13.3 12.1 12.2  HCT 41.7 37.3 36.7  MCV 98.6 97.4 95.8  MCH 31.4 31.6 31.9  MCHC 31.9 32.4 33.2  RDW 15.5 15.7* 15.4  PLT 131* 123* 126*    Cardiac EnzymesNo results for input(s): "TROPONINI" in the last 168  hours. No results for input(s): "TROPIPOC" in the last 168 hours.   BNP Recent Labs  Lab 11/28/22 1544  BNP 310.4*     DDimer No results for input(s): "DDIMER" in the last 168 hours.   Radiology    No results found.   Patient Profile     82 y.o. female hx of HFpEF, HTN, Permanent AF, OSA with worsening LE edema in the setting of Pump Back  Assessment & Plan     Pulmonary Hypertension HFpEF (acute on chronic) Tricuspid regurgitation - WHO Functional Class unclear but asymptomatic at rest, Stage C, hypervolemic, WHO II & III in the setting of Morbid Obesity, HFpEF, Untreated OSA - Diuretic regimen: Torsemide, now switching to daily; I suspect he are unmasking underlying kidney disease - discussed with family an additional 20 mg torsemide as a PRN; she has gained ~ 30 lbs of weight since the winter holidays (now removed) - Aldactone 25 mg Po daily - continue SGLT2i - Fluid restriction of < 2 L  - No signs or symptoms of connective tissue disease and no family history of the CT or  PH  - planned for conservative inpatient diagnostics; defer High res CT, PFTs, Hepatic Duplex as outpatient; defer RHC for now;  - will need outpatient f/u with sleep medicine - outpatient would be reasonable outpatient GLP-1 therapy candidate - Normal TSH  HTN - MRA &  ARB  Permanent AF complicated by [facility] - Continue BB for now - DOAC when cleared by NSG    For questions or updates, please contact [facility] Please consult [email or web address] for contact info under Cardiology/STEMI.      Rudean Haskell, MD [address], [address] [place],  [address] [phone]  7:59 AM

## 2022-12-05 DIAGNOSIS — I609 Nontraumatic subarachnoid hemorrhage, unspecified: Secondary | ICD-10-CM | POA: Diagnosis not present

## 2022-12-05 LAB — URINALYSIS, ROUTINE W REFLEX MICROSCOPIC
Bilirubin Urine: NEGATIVE
Glucose, UA: 150 mg/dL — AB
Ketones, ur: NEGATIVE mg/dL
Nitrite: NEGATIVE
Protein, ur: 100 mg/dL — AB
RBC / HPF: >50 RBC/hpf (ref 0–5)
Specific Gravity, Urine: 1.019 (ref 1.005–1.030)
pH: 7 (ref 5.0–8.0)

## 2022-12-05 LAB — MAGNESIUM: Magnesium: 1.9 mg/dL (ref 1.7–2.4)

## 2022-12-05 LAB — CBC
HCT: 38.4 % (ref 36.0–46.0)
Hemoglobin: 12.2 g/dL (ref 12.0–15.0)
MCH: 31 pg (ref 26.0–34.0)
MCHC: 31.8 g/dL (ref 30.0–36.0)
MCV: 97.5 fL (ref 80.0–100.0)
Platelets: 146 10*3/uL — ABNORMAL LOW (ref 150–400)
RBC: 3.94 MIL/uL (ref 3.87–5.11)
RDW: 15.3 % (ref 11.5–15.5)
WBC: 6.8 10*3/uL (ref 4.0–10.5)
nRBC: 0 % (ref 0.0–0.2)

## 2022-12-05 LAB — BASIC METABOLIC PANEL
Anion gap: 10 (ref 5–15)
BUN: 26 mg/dL — ABNORMAL HIGH (ref 8–23)
CO2: 31 mmol/L (ref 22–32)
Calcium: 9 mg/dL (ref 8.9–10.3)
Chloride: 99 mmol/L (ref 98–111)
Creatinine, Ser: 1.46 mg/dL — ABNORMAL HIGH (ref 0.44–1.00)
GFR, Estimated: 36 mL/min — ABNORMAL LOW (ref >60)
Glucose, Bld: 93 mg/dL (ref 70–99)
Potassium: 3.8 mmol/L (ref 3.5–5.1)
Sodium: 140 mmol/L (ref 135–145)

## 2022-12-05 MED ORDER — MIRABEGRON ER 25 MG PO TB24
25.0000 mg | ORAL_TABLET | Freq: Every day | ORAL | Status: DC
  Administered 2022-12-05 – 2022-12-13 (×9): 25 mg via ORAL
  Filled 2022-12-05 (×10): qty 1

## 2022-12-05 MED ORDER — SENNA 8.6 MG PO TABS: 1.0000 | ORAL_TABLET | Freq: Every evening | ORAL | Status: DC | PRN

## 2022-12-05 MED ORDER — CEPHALEXIN 250 MG PO CAPS
500.0000 mg | ORAL_CAPSULE | Freq: Two times a day (BID) | ORAL | Status: AC
  Administered 2022-12-05 – 2022-12-10 (×10): 500 mg via ORAL
  Filled 2022-12-05 (×10): qty 2

## 2022-12-05 NOTE — Progress Notes (Signed)
Follow up arranged on 4/23 with Dr Marlena Clipper office, see AVS

## 2022-12-05 NOTE — Progress Notes (Signed)
Patient ID: [name], female   DOB: December 04, 1940, 82 y.o.   MRN: [id number]  [id number] left message for [name] to inform on ELOS and encouraged follow-up to discuss discharge plan.   [name], MSW, [place] Office: [phone] Cell: [phone] Fax: [phone]

## 2022-12-05 NOTE — Progress Notes (Signed)
Occupational Therapy TBI Note  Patient Details  Name: [name] MRN: [id number] Date of Birth: [birth year]  Today's Date: 12/05/2022 OT Individual Time: [id number] OT Individual Time Calculation (min): 79 min    Short Term Goals: Week 1:  OT Short Term Goal 1 (Week 1): Pt will stand to groom at sink to demo improved activity tolerance OT Short Term Goal 2 (Week 1): Pt will complete SPT wiht LRAD and MIN A OT Short Term Goal 3 (Week 1): Pt will complete 2/3 steps of toileting with MIN A for balance OT Short Term Goal 4 (Week 1): Pt will be A&O x 4 with compensatory strategies PRN  Skilled Therapeutic Interventions/Progress Updates:     Patient agreeable to participate in OT session. Reports 0/10 pain level.   Patient participated in skilled OT session focusing on ADL re-training, functional transfers/mobility while participating in shower. Therapist educated pt regarding use of AE to utilize during bathing and dressing in order to improve functional performance during BADL tasks. Long handled sponge not available for use during bathing although this therapist provided one to use during next OT session focusing on bathing.  Pt sitting on EOB transitioning from sit to stand using RW. First 2 attempts were unsuccessful with bed at low level. Once bed elevated, pt complete sit to stand with [employer]. No VC required for hand placement with RW management. Pt completed functional mobility to/from bathroom using RW with Min guard assist. Bathing completed primarily seated using tub bench. Pt complete UB bathing with SBA, LB bathing with Min A required physical assist to wash both feet.  Pt completed dressing seated in w/c at sink level. SBA provided for UB dressing, Min A required for LB dressing. Pt was provided verbal instructions and visual demonstration for use of reacher to doff hospital socks and for sock aid to don clean socks. With increased time, pt was able to don each leg into pants. No  physical assist provided although min cueing provided for problem solving. No LOB noted when standing to pull pants up.  Pt participated in standing balance activity utilizing BITS with RW for balance. Pt used one extremity on RW at all times to maintain balance while therapist provided SBA.  Completed Bell Cancellation task, standing, with 1 distraction hit, all bell located with a total time of 6:09.   Therapy Documentation Precautions:  Precautions Precautions: Fall Restrictions Weight Bearing Restrictions: No Agitated Behavior Scale: TBI Observation Details Observation Environment: Pt's room Start of observation period - Date: 12/02/21 Start of observation period - Time: 0800 End of observation period - Date: 12/03/22 End of observation period - Time: 0922 Agitated Behavior Scale (DO NOT LEAVE BLANKS) Short attention span, easy distractibility, inability to concentrate: Absent Impulsive, impatient, low tolerance for pain or frustration: Absent Uncooperative, resistant to care, demanding: Absent Violent and/or threatening violence toward people or property: Absent Explosive and/or unpredictable anger: Absent Rocking, rubbing, moaning, or other self-stimulating behavior: Absent Pulling at tubes, restraints, etc.: Absent Wandering from treatment areas: Absent Restlessness, pacing, excessive movement: Absent Repetitive behaviors, motor, and/or verbal: Absent Rapid, loud, or excessive talking: Absent Sudden changes of mood: Absent Easily initiated or excessive crying and/or laughter: Absent Self-abusiveness, physical and/or verbal: Absent Agitated behavior scale total score: 14    Therapy/Group: Individual Therapy  Ailene Ravel, OTR/L,CBIS  Supplemental OT - [facility] and [facility] Secure Chat Preferred   12/05/2022, 11:06 AM

## 2022-12-05 NOTE — Progress Notes (Signed)
PROGRESS NOTE   Subjective/Complaints:   Patient is extremely upset this a.m. regarding being unable to use pure wick.  Discussed how this is not typically an option at home, and generally only used to ensure that wounds do not get soiled.  Patient endorses that at home, she had ongoing urinary urgency, would wear pads at night and briefs during the daytime.  Discussed that that is a reasonable option here as well, and she endorses never being treated for prior urinary urgency.  Additionally, she reports some dysuria this morning.  No recent UTIs.  Did take bowel medication overnight, had diarrhea this a.m.  ROS: Positive for urinary urgency, dysuria, and diarrhea..  Patient denies fever, rash, sore throat, blurred vision, dizziness, nausea, vomiting,  cough, shortness of breath or chest pain,  headache, or mood change.   Objective:   No results found. Recent Labs    12/05/22 0540  WBC 6.8  HGB 12.2  HCT 38.4  PLT 146*    Recent Labs    12/04/22 0648 12/05/22 0540  NA 139 140  K 3.7 3.8  CL 97* 99  CO2 30 31  GLUCOSE 93 93  BUN 25* 26*  CREATININE 1.48* 1.46*  CALCIUM 9.2 9.0     Intake/Output Summary (Last 24 hours) at 12/05/2022 1006 Last data filed at 12/05/2022 0700 Gross per 24 hour  Intake 717 ml  Output 850 ml  Net -133 ml         Physical Exam: Vital Signs Blood pressure (!) 116/59, pulse 81, temperature 98.6 F (37 C), temperature source Oral, resp. rate 17, height '5\' 8"'$  (1.727 m), weight 121.6 kg, last menstrual period 02/07/2012, SpO2 95 %. Constitutional: No distress . Vital signs reviewed. HEENT: NCAT, EOMI, oral membranes moist Neck: supple Cardiovascular: RRR with sys murmur. No JVD    Respiratory/Chest: CTA Bilaterally without wheezes or rales. Normal effort    GI/Abdomen: BS +, non-tender, non-distended Ext: no clubbing, cyanosis  Psych: pleasant and cooperative  Skin: Multiple  bruises on bilateral cheeks, arm, and knees. -Healing Chronic venous stasis changed BL LE L>R. Raised polyps on L shin and medial ankle.   Area of erythema pictured below has improved/receded- -mostly resolved  MSK:      No apparent deformity.      Strength: BL UE 4/5 grossly LLE 3/5 HF, 3/5 KE, 4/5 DF, 4/5 PF--unchanged RLE 4/5 HF, 4/5 KE, 5/5 DF, 5/5 PF--unchanged  Neurologic exam:  DTRs: Reflexes were 2+ in bilateral Ues; negative in BL patella d/t TKR. Babinsky: flexor responses b/l.   Hoffmans: negative b/l Sensory exam: revealed normal sensation in all dermatomal regions in bilateral UE and LLE; RLE sensory deficit in distal plantar foot to mid-shin. Coordination: + Ataxia on FTN in bilateral UE, L>R Mild STM deficits, sl decreased insight and processing speed    Assessment/Plan: 1. Functional deficits which require 3+ hours per day of interdisciplinary therapy in a comprehensive inpatient rehab setting. Physiatrist is providing close team supervision and 24 hour management of active medical problems listed below. Physiatrist and rehab team continue to assess barriers to discharge/monitor patient progress toward functional and medical goals  Care Tool:  Bathing  Body parts bathed by patient: Right arm, Left arm, Chest, Abdomen, Right upper leg, Left upper leg, Face   Body parts bathed by helper: Front perineal area, Buttocks, Right lower leg, Left lower leg     Bathing assist Assist Level: 2 Helpers     Upper Body Dressing/Undressing Upper body dressing   What is the patient wearing?: Pull over shirt, Bra    Upper body assist Assist Level: Minimal Assistance - Patient > 75%    Lower Body Dressing/Undressing Lower body dressing      What is the patient wearing?: Underwear/pull up, Pants     Lower body assist Assist for lower body dressing: 2 Helpers     Toileting Toileting    Toileting assist Assist for toileting: 2 Helpers     Transfers Chair/bed  transfer  Transfers assist  Chair/bed transfer activity did not occur: Safety/medical concerns  Chair/bed transfer assist level: 2 Biomedical engineer   Ambulation assist      Assist level: Contact Guard/Touching assist Assistive device: Walker-rolling Max distance: 78f   Walk 10 feet activity   Assist     Assist level: Contact Guard/Touching assist Assistive device: Walker-rolling   Walk 50 feet activity   Assist Walk 50 feet with 2 turns activity did not occur: Safety/medical concerns         Walk 150 feet activity   Assist Walk 150 feet activity did not occur: Safety/medical concerns         Walk 10 feet on uneven surface  activity   Assist Walk 10 feet on uneven surfaces activity did not occur: Safety/medical concerns         Wheelchair     Assist Is the patient using a wheelchair?: Yes Type of Wheelchair: Manual    Wheelchair assist level: Dependent - Patient 0% Max wheelchair distance: 150'    Wheelchair 50 feet with 2 turns activity    Assist        Assist Level: Dependent - Patient 0%   Wheelchair 150 feet activity     Assist      Assist Level: Dependent - Patient 0%   Blood pressure (!) 116/59, pulse 81, temperature 98.6 F (37 C), temperature source Oral, resp. rate 17, height '5\' 8"'$  (1.727 m), weight 121.6 kg, last menstrual period 02/07/2012, SpO2 95 %. Medical Problem List and Plan: 1. 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  Antithrombotics: -DVT/anticoagulation:  Mechanical:  Antiembolism stockings, knee (TED hose) Bilateral lower extremities>>patient states she refuses compression stockings; can ACE wrap when OOB  - Was on Eliquis 5 mg BID PTA, Will need to clear resumption with Dr. CChristella Noafor discharge vs. OP  with repeat imaging             - antiplatelet therapy: none   3. Pain Management: Tylenol as needed   4. Mood/Behavior/Sleep: LCSW to evaluate and provide emotional support             -antipsychotic agents: n/a   -will add low dose melatonin tonight for sleep 3/10   -has trazodone prn which hasn't been used  5. Neuropsych/cognition: This patient is capable of making decisions on her  own behalf.   6. Skin/Wound Care: Routine skin care checks   - 3/10: ?cellulitis LLE -improved. No signs of active infection; self resolved without treatment    7. Fluids/Electrolytes/Nutrition: Strict Is and Os and follow-up chemistries             -fluid restriction <2 liters             -continue KCl             -continue B12   - admission labs mag, k stable   - Decreasing PO mag from 800 mg BID to Daily 3/9; repeat on Monday- -added onto a.m. labs   8: Hypertension: monitor TID and prn      - bp controlled, monitor closely for s/s orthostasis      12/05/2022    5:00 AM 12/05/2022    3:16 AM 12/04/2022    7:41 PM  Vitals with BMI  Weight 268 lbs 1 oz 265 lbs   BMI [id number]   Systolic  [id number]  Diastolic  59 58  Pulse  81 87      9: GERD: continue Protonix   10: Iron deficiency:continue ferrous sulfate 325 mg BID   11: 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:              -fluid restriction <2 liters             -daily weight (dry weight 270 lbs per OP meds)             -continue Jardiance 10 mg daily             -continue spironolactone 25 mg daily             -continue irbesartan 150 mg daily             -continue magnesium oxide 400 mg BID -> daily 3/9             -continue KCl 10 mEq daily Filed Weights   12/04/22 0535 12/05/22 0316 12/05/22 0500  Weight: 121.1 kg 120.2 kg 121.6 kg   -3/10 weights appear to be down-trending, now on lasix '40mg'$  bid - 3/11: Weight stable, on torsemide 40 mg daily, continue following cardiology recommendations which are much  appreciated    12: Chronic BLE edema/lymphedema:              -Unable to tolerate TED hose; may do well with ACE wrapping, d/w therapies for AM             - Would benefit from OP lymphedema therapy and potentially lymphedema pumps   -elevate legs while in bed  13: Permanent atrial fibrillation:             -continue Lopressor 50 mg BID             -DOAC when cleared by NS   14: OSA; does not use CPAP             -sleep medicine follow-up as outpatient   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   - 3/11: Creatinine is stable at 1.4, per cardiology likely unveiling prior worsening CKD now that she is at a normal volume, continue to monitor.  16. Vertigo/Dizziness.  -Stable  - Vestibular eval ordered - still pending - Meclizine 12.5 mg TID PRN  17.  Dysuria/urinary urgency.  Urgency preexisted current diuretic regimen, was untreated. -Urinalysis pending -Start Myrbetriq 25 mg daily -PVRs twice per to shift x 3 days to ensure no retention  18.  Diarrhea -Continue Colace twice daily, move Senokot-S from nightly to as needed    LOS: 4 days A FACE TO FACE EVALUATION WAS PERFORMED  Gertie Gowda 12/05/2022, 10:06 AM

## 2022-12-05 NOTE — Progress Notes (Signed)
Progress Note  Patient Name: [name] Date of Encounter: 12/05/2022  Primary Cardiologist: Carlyle Dolly, MD   Subjective   Creatinine is stable for three days on current regimen. Patient notes burning urination.  Inpatient Medications    Scheduled Meds:  cyanocobalamin  1,000 mcg Oral Daily   docusate sodium  100 mg Oral BID   empagliflozin  10 mg Oral Daily   ferrous sulfate  325 mg Oral BID WC   irbesartan  150 mg Oral Daily   magnesium oxide  400 mg Oral Daily   melatonin  3 mg Oral QHS   metoprolol tartrate  50 mg Oral BID   pantoprazole  80 mg Oral Daily   potassium chloride SA  40 mEq Oral Daily   senna  1 tablet Oral Daily   spironolactone  25 mg Oral Daily   torsemide  40 mg Oral Daily   Continuous Infusions:  sodium chloride 10 mL/hr at 12/01/22 1618   PRN Meds: acetaminophen, alum & mag hydroxide-simeth, calcium carbonate, guaiFENesin-dextromethorphan, meclizine, methocarbamol, polyethylene glycol, prochlorperazine **OR** prochlorperazine **OR** prochlorperazine, sodium phosphate, sorbitol, traZODone   Vital Signs    Vitals:   12/04/22 1300 12/04/22 1941 12/05/22 0316 12/05/22 0500  BP: (!) 143/59 (!) 121/58 (!) 116/59   Pulse: 86 87 81   Resp: '17 18 17   '$ Temp: 97.8 F (36.6 C) 98.2 F (36.8 C) 98.6 F (37 C)   TempSrc: Oral Oral Oral   SpO2: 100% 97% 95%   Weight:   120.2 kg 121.6 kg  Height:        Intake/Output Summary (Last 24 hours) at 12/05/2022 0754 Last data filed at 12/05/2022 [id number] Gross per 24 hour  Intake 480 ml  Output 650 ml  Net -170 ml   Filed Weights   12/04/22 0535 12/05/22 0316 12/05/22 0500  Weight: 121.1 kg 120.2 kg 121.6 kg    ECG    No new - Personally Reviewed  Physical Exam   Gen: No distress, Morbid Obesity   Neck: Thick neck Cardiac: No Rubs or Gallops, systolic murmur, IRIR rhythm; no RV heave Respiratory: Clear to auscultation bilaterally, normal effort, normal respiratory rate GI: Soft,  nontender, non-distended  MS: painless +1 bilateral edema;  moves all extremities Integument: chronic venous changes Psych: Normal affect, patient feels well   Labs    Chemistry Recent Labs  Lab 12/02/22 0708 12/04/22 0648 12/05/22 0540  NA 139 139 140  K 3.7 3.7 3.8  CL 99 97* 99  CO2 '27 30 31  '$ GLUCOSE 93 93 93  BUN 25* 25* 26*  CREATININE 1.49* 1.48* 1.46*  CALCIUM 9.3 9.2 9.0  PROT 6.0*  --   --   ALBUMIN 2.8*  --   --   AST 18  --   --   ALT 12  --   --   ALKPHOS 63  --   --   BILITOT 4.1*  --   --   GFRNONAA 35* 35* 36*  ANIONGAP '13 12 10     '$ Hematology Recent Labs  Lab 11/29/22 0829 12/02/22 0708 12/05/22 0540  WBC 7.0 6.3 6.8  RBC 3.83* 3.83* 3.94  HGB 12.1 12.2 12.2  HCT 37.3 36.7 38.4  MCV 97.4 95.8 97.5  MCH 31.6 31.9 31.0  MCHC 32.4 33.2 31.8  RDW 15.7* 15.4 15.3  PLT 123* 126* 146*    Cardiac EnzymesNo results for input(s): "TROPONINI" in the last 168 hours. No results for input(s): "TROPIPOC" in  the last 168 hours.   BNP Recent Labs  Lab 11/28/22 1544  BNP 310.4*     DDimer No results for input(s): "DDIMER" in the last 168 hours.   Radiology    No results found.   Patient Profile     82 y.o. female hx of HFpEF, HTN, Permanent AF, OSA with worsening LE edema in the setting of [place]  Assessment & Plan    Pulmonary Hypertension HFpEF (acute on chronic) Tricuspid regurgitation - WHO Functional Class unclear but asymptomatic at rest, Stage C, hypervolemic, WHO II & III in the setting of Morbid Obesity, HFpEF, Untreated OSA - Diuretic regimen: Torsemide 40 mg PO daily appears to be a reasonable dose to remove volume without escalating Creatinine further - would discharge to torsemide 40 mg PO daily - Aldactone 25 mg Po daily - given UTI like symptoms stopping SGLT2i; rechallenge as outpatient - Fluid restriction of < 2 L  - No signs or symptoms of connective tissue disease and no family history of the CT or PH  - planned for  conservative inpatient diagnostics; defer High res CT, PFTs, Hepatic Duplex as outpatient; defer RHC for now;  - will need outpatient f/u with sleep medicine - outpatient would be reasonable outpatient GLP-1 therapy candidate - Normal TSH  HTN - MRA &  ARB  Permanent AF complicated by [facility] - Continue BB  - DOAC when cleared by [place] will sign off.   Working on follow up (Dr. Zandra Abts)   For questions or updates, please contact [facility] Please consult [email or web address] for contact info under Cardiology/STEMI.      Rudean Haskell, MD [facility] [address] [place], [place] [address] [phone]  7:54 AM

## 2022-12-05 NOTE — Progress Notes (Addendum)
Notified PA (Setzer) of UA results.    Yehuda Mao, LPN

## 2022-12-05 NOTE — Progress Notes (Signed)
Attempted to meet with patient. Will try again later.

## 2022-12-05 NOTE — Care Management (Signed)
Inpatient Rehabilitation Center Individual Statement of Services  Patient Name:  [name]  Date:  12/05/2022  Welcome to the [facility].  Our goal is to provide you with an individualized program based on your diagnosis and situation, designed to meet your specific needs.  With this comprehensive rehabilitation program, you will be expected to participate in at least 3 hours of rehabilitation therapies Monday-Friday, with modified therapy programming on the weekends.  Your rehabilitation program will include the following services:  Physical Therapy (PT), Occupational Therapy (OT), 24 hour per day rehabilitation nursing, Therapeutic Recreaction (TR), Psychology, Neuropsychology, Care Coordinator, Rehabilitation Medicine, [place], and Other  Weekly team conferences will be held on Tuesdays to discuss your progress.  Your Inpatient Rehabilitation Care Coordinator will talk with you frequently to get your input and to update you on team discussions.  Team conferences with you and your family in attendance may also be held.  Expected length of stay: 2-2.5 weeks  Overall anticipated outcome: Supervision  Depending on your progress and recovery, your program may change. Your Inpatient Rehabilitation Care Coordinator will coordinate services and will keep you informed of any changes. Your Inpatient Rehabilitation Care Coordinator's name and contact numbers are listed  below.  The following services may also be recommended but are not provided by the [facility] will be made to provide these services after discharge if needed.  Arrangements include referral to agencies that provide these services.  Your insurance has been verified to be:  Medicare A/B  Your primary doctor is:  [place]  [place]  Pertinent information will be shared with your doctor and your insurance company.  Inpatient Rehabilitation Care Coordinator:  Cathleen Corti [phone] or [phone]  Information discussed with and copy given to patient by: Rana Snare, 12/05/2022, 11:04 AM

## 2022-12-05 NOTE — Progress Notes (Signed)
Physical Therapy Session Note  Patient Details  Name: [name] MRN: [id number] Date of Birth: [birth year]  Today's Date: 12/05/2022 PT Individual Time: [id number] PT Individual Time Calculation (min): 71 min   Short Term Goals: Week 1:  PT Short Term Goal 1 (Week 1): Pt will complete bed mobility with CGA. PT Short Term Goal 2 (Week 1): Pt will complete bed to chair transfer consistently with CGA. PT Short Term Goal 3 (Week 1): Pt will ambulate x50' with minA and LRAD. PT Short Term Goal 4 (Week 1): Pt will complete x8 steps with bilateral hand rails and minA.  Skilled Therapeutic Interventions/Progress Updates:     Pt received seated in [employer] and agrees to therapy. No complaint of pain. Pt verbalizes need to use restroom to urinate. Sit to stand with CGA and cues for hand placement. Pt ambultes to toilet with CGA and cues for safe AD management. Following toileting, pt ambulates to sink and washes hands in standing to work on activity tolerance. WC transport to gym for time management. Pt stands with RW and cues for initiation and sequencing. Pt then ambulates x175' with RW and cues for upright gaze to improve posture and blaance, and decreasing WB thorugh RW for energy conservation and body mechanics. Following extended seated rest break, pt performs same bout of ambulation with cognitive challenge included, tasked with naming items from supermarket with each letter of the alphabet. Pt has significantly slower gait speed with addition of cognitive challenge, and also requires min cueing for correct performance of cognitive task.   Pt reports occasional dizziness or "wooziness", so PT assesses blood pressure in sitting and standing:  Sitting - 144/83 Standing - 150/79  Pt performs sit to stand with CGA and no AD. Pt practices standing without upper extremity support to challenge balance. PT provides CGA/minA at trunk for stability and pt performs lateral weight shifting progressing to marching  in place. Pt tends to look down at ground and requires cues to focus gaze at eye level for improved balance.   Pt ambulates back to room, x300', with RW and CGA, with +2 WC follow for safety. Pt left seated on toilet with call bell in reach.   Therapy Documentation Precautions:  Precautions Precautions: Fall Restrictions Weight Bearing Restrictions: No    Therapy/Group: Individual Therapy  Breck Coons, PT, DPT 12/05/2022, 5:25 PM

## 2022-12-05 NOTE — Progress Notes (Signed)
Physical Therapy TBI Note  Patient Details  Name: [name] MRN: [id number] Date of Birth: 1940/12/22  Today's Date: 12/05/2022 PT Individual Time: 1030-1104 PT Individual Time Calculation (min): 34 min   Short Term Goals: Week 1:  PT Short Term Goal 1 (Week 1): Pt will complete bed mobility with CGA. PT Short Term Goal 2 (Week 1): Pt will complete bed to chair transfer consistently with CGA. PT Short Term Goal 3 (Week 1): Pt will ambulate x50' with minA and LRAD. PT Short Term Goal 4 (Week 1): Pt will complete x8 steps with bilateral hand rails and minA.  Skilled Therapeutic Interventions/Progress Updates:  Patient ambulating form bathroom back to w/c in room with NT on entrance to room. Patient alert and agreeable to PT session.   Patient with no pain complaint at start of session.  Therapeutic Activity: Transfers: Pt performed sit<>stand, stand pivot, and toilet transfers throughout session with very light MinA initially and improving to near supervision by end of session. Provided verbal cues for forward lean with push from armrests of seat in order to produce adequate anterior weight shift for rise to stand with improving LOA.  Gait Training:  Pt ambulated 108' x1/ 77' x1/ 19' x1 using bari RW with CGA. Demonstrated very slow pace with limited knee flexion bilaterally. No vc provided for technique. Seated rest break between bouts for increased dizziness. Continues to pause in hallway with passersby in order to reduce symptoms of dizziness.   Ambulated back into room and into bathroom for requested toileting. Pt is able to transfer to toilet with CGA, manages clothing with CGA, and performs pericare with supervision.   Ambulatory transfer back to bed using RW with sidestepping for positioning. Overall supervision to reach seated position at EOB. Sit-->supine with CGA for LLE.   Patient supine at end of session with brakes locked, bed alarm set, and all needs within  reach.   Therapy Documentation Precautions:  Precautions Precautions: Fall Restrictions Weight Bearing Restrictions: No General:   Vital Signs:  Pain: No pain related this session.  Agitated Behavior Scale: TBI Observation Details Observation Environment: CIR Start of observation period - Date: 12/05/22 Start of observation period - Time: 1030 End of observation period - Date: 12/05/22 End of observation period - Time: 1100 Agitated Behavior Scale (DO NOT LEAVE BLANKS) Short attention span, easy distractibility, inability to concentrate: Absent Impulsive, impatient, low tolerance for pain or frustration: Absent Uncooperative, resistant to care, demanding: Absent Violent and/or threatening violence toward people or property: Absent Explosive and/or unpredictable anger: Absent Rocking, rubbing, moaning, or other self-stimulating behavior: Absent Pulling at tubes, restraints, etc.: Absent Wandering from treatment areas: Absent Restlessness, pacing, excessive movement: Absent Repetitive behaviors, motor, and/or verbal: Absent Rapid, loud, or excessive talking: Absent Sudden changes of mood: Absent Easily initiated or excessive crying and/or laughter: Absent Self-abusiveness, physical and/or verbal: Absent Agitated behavior scale total score: 14   Therapy/Group: Individual Therapy  Alger Simons PT, DPT, CSRS 12/05/2022, 10:22 AM

## 2022-12-06 DIAGNOSIS — I609 Nontraumatic subarachnoid hemorrhage, unspecified: Secondary | ICD-10-CM | POA: Diagnosis not present

## 2022-12-06 LAB — BASIC METABOLIC PANEL
Anion gap: 12 (ref 5–15)
BUN: 27 mg/dL — ABNORMAL HIGH (ref 8–23)
CO2: 27 mmol/L (ref 22–32)
Calcium: 8.9 mg/dL (ref 8.9–10.3)
Chloride: 101 mmol/L (ref 98–111)
Creatinine, Ser: 1.5 mg/dL — ABNORMAL HIGH (ref 0.44–1.00)
GFR, Estimated: 35 mL/min — ABNORMAL LOW (ref >60)
Glucose, Bld: 89 mg/dL (ref 70–99)
Potassium: 3.8 mmol/L (ref 3.5–5.1)
Sodium: 140 mmol/L (ref 135–145)

## 2022-12-06 NOTE — Patient Care Conference (Signed)
Inpatient RehabilitationTeam Conference and Plan of Care Update Date: 12/06/2022   Time:10:03 AM    Patient Name: [name]      Medical Record Number: [id number]  Date of Birth: 1941-02-02 Sex: Female         Room/Bed: 4M04C/4M04C-01 Payor Info: Payor: MEDICARE / Plan: MEDICARE PART A AND B / Product Type: *No Product type* /    Admit Date/Time:  12/01/2022  2:12 PM  Primary Diagnosis:  Traumatic brain injury [facility])  Hospital Problems: Principal Problem:   Traumatic brain injury [facility])    Expected Discharge Date: Expected Discharge Date: 12/13/22  Team Members Present: Physician leading conference: Dr. Durel Salts Social Worker Present: [name], [place] Nurse Present: Tacy Learn, RN PT Present: Tereasa Coop, PT OT Present: Mariane Masters, OT SLP Present: Weston Anna, SLP PPS Coordinator present : Gunnar Fusi, SLP     Current Status/Progress Goal Weekly Team Focus  Bowel/Bladder   Patient is continent  with assistance to bathroom.   Patient to remain continent. Able to voice need to void.   Assist patient to bathroom    Swallow/Nutrition/ Hydration               ADL's   Min A-min guard stand pivot transfer with RW, SBA UB bathing/dressing, Min A for LB bathing and dressing.   SBA overall   Standing balance/tolerance, toileting schedule, patient/family training, ADL re-training, Use of AE during LB bathing/dressing.    Mobility   CGA transfers, ambulation x250' with RW, x4 6" steps with bilateral hand rails   Supervision  balance, ambulation, stair training, endurance    Communication                Safety/Cognition/ Behavioral Observations  [place] Level: VII-Overall Mod A   Supervision   selective attention, recall and functional problem solving    Pain   Patient complaining of Pain. PRN tylenol provided.   Patient to be able to report pain. Provide appropriate pain intervention.   Pain assessment every shift and PRN.     Skin   Patient has no pressure injury.   Patient to remain free of pressure injury during rehab stay. Patient to be able to turn and reposition independently.  Skin assessment.      Discharge Planning:  Discharge location is pending. SW unable to make contact with pt dtr [name] who lives out of state. Pt desires to discharge to home with caregivers. Unsure if SNF will be needed. SW will confirm there are no barriers to discharge.   Team Discussion: TBI. [place] 7. Incontinent/continent B/B. Occasional pain managed with PRN medications. Skin is intact. PO dieretics going well. Daily weights. UTI/Keflex. Discussions with family over 24/7 caregivers vs SNF. Overall CGA transfers with gait 250' with RW. 4 6in steps with bilateral HR. MinA/MinG stand piviot transfers with RW. SBA UB bathing/dressing. MinA LB bathing/dressing. Safety ModA.  Patient on target to meet rehab goals: yes, progressing towards goals of supervision   *See Care Plan and progress notes for long and short-term goals.   Revisions to Treatment Plan:  Medication adjustments, monitor daily weights, ABT for UTI  Teaching Needs: Medications, safety, gait/transfer training, skin care, self care, etc.   Current Barriers to Discharge: Decreased caregiver support, Home enviroment access/layout, Incontinence, Lack of/limited family support, Insurance for SNF coverage, and Weight  Possible Resolutions to Barriers: Family education, nursing education, order recommended DME     Medical Summary Current Status: medically complicated by volume overload, NYHA class II  HF, uti, urinary urgency, diarrhea, AKI on CKD, pain control and lymphedema  Barriers to Discharge: Cardiac Complications;Electrolyte abnormality;Medical stability;Morbid Obesity;Renal Insufficiency/Failure;Self-care education;Uncontrolled Pain;Volume Overload  Barriers to Discharge Comments: volume control, urinary tract infection and urgency Possible Resolutions to  [employer] Focus: antibiotics, medication for urinary urgency monitorring for retention, lymphedema management, pain management, transition to fully PO regimen for heart failure   Continued Need for Acute Rehabilitation Level of Care: The patient requires daily medical management by a physician with specialized training in physical medicine and rehabilitation for the following reasons: Direction of a multidisciplinary physical rehabilitation program to maximize functional independence : Yes Medical management of patient stability for increased activity during participation in an intensive rehabilitation regime.: Yes Analysis of laboratory values and/or radiology reports with any subsequent need for medication adjustment and/or medical intervention. : Yes   I attest that I was present, lead the team conference, and concur with the assessment and plan of the team.   Ernest Pine 12/06/2022, 12:56 PM

## 2022-12-06 NOTE — Progress Notes (Signed)
Patient ID: [name], female   DOB: 03-Jun-1941, 82 y.o.   MRN: [id number]  SW met with pt in room to provide updates from team conference, and d/c date on 3/19  1352- Pt dtr [name] reports her and her sister Jeani Hawking are here at hospital. SW will go by room to meet with them and provide updates.   *SW met with pt daughters [name] and Jeani Hawking to provide updates from team conference, and d/c date 3/19. Family unsure on the best plan of care and intend to discuss with their brother later. Discussed all possible options for 24/7 care: home with home health and private aide if no family that can assist, home with outpatient and private aideif no family that can assist. Family brought up SNF. SW explained SNF placement process, and if pt approved for SNF, they would be required to transport pt due to her not being ambulance level appropriate, and if she was, would not be able to transport. Family surprised on gains made. Biggest concern if physical assistance. SW reiterated current progress with supervision goals. SW suggested family edu for them to come and see mother's progress. Family may possibly come tomorrow during scheduled therapy sessions. Family will follow-up with SW to confirm discharge plan.   [name], MSW, [place] Office: [phone] Cell: [phone] Fax: [phone]

## 2022-12-06 NOTE — Progress Notes (Signed)
Occupational Therapy TBI Note  Patient Details  Name: [name] MRN: [id number] Date of Birth: January 20, 1941  Today's Date: 12/06/2022 OT Individual Time: 0900-1000 OT Individual Time Calculation (min): 60 min   Today's Date: 12/06/2022 OT Individual Time: 1350-1430 OT Individual Time Calculation (min): 40 min  Short Term Goals: Week 1:  OT Short Term Goal 1 (Week 1): Pt will stand to groom at sink to demo improved activity tolerance OT Short Term Goal 2 (Week 1): Pt will complete SPT wiht LRAD and MIN A OT Short Term Goal 3 (Week 1): Pt will complete 2/3 steps of toileting with MIN A for balance OT Short Term Goal 4 (Week 1): Pt will be A&O x 4 with compensatory strategies PRN  Skilled Therapeutic Interventions/Progress Updates:    Pt received in bed with no pain.  ADL: Pt completes ADL at overall MIN A Level. Skilled interventions include: gathering items needed for BADL in standing with inuse of reacher/socka ide for LB dressing. Pt able ot recall procedure of sock aide but needs instruction with reacher for sock aide doffing as well as pants donning. Grooming at sink in standing with CGA. Increased time to sequence but no cuing required. Pt completes ambulatory transfer with RW into and out of bathroom. Instruction on use of BSC and RW v grab bar as pt does not have a grab bar next to toilet at home.   Therapeutic exercise Pt completes 3x30 ball toss (chest, bounce, overhead pass) in seated position with 1 # wrist weights to improve BUE coordination/strengthening required for BADLs/functional transfers.  Pt left at end of session in bed with exit alarm on, call light in reach and all needs met  Session 2: Pt received in w/c with NT in room just finishing toileting   Therapeutic activity Pt agreeable to going outside for uneven surface mobility and mood support. While OT transports pt education provided about light/temp sensitivity and strategies for management especially with summer  months coming up. Once outside bouts of walking with walker performed and CGA and cuing for RW management over small cracks/bumps. Rest provided as needed.  Pt left at end of session in w/c with exit alarm on, call light in reach and all needs met    Therapy Documentation Precautions:  Precautions Precautions: Fall Restrictions Weight Bearing Restrictions: No General:      Agitated Behavior Scale: TBI ABS discontinued d/t ABS score less than 20 for the last three days or no behaviors present       Therapy/Group: Individual Therapy  Tonny Branch 12/06/2022, 6:55 AM

## 2022-12-06 NOTE — Progress Notes (Signed)
PROGRESS NOTE   Subjective/Complaints:  Cardiology signed off with OP f/u today; stable on PO regimen.   Patient states that urinary urgency has greatly improved with Myrbetriq and treatment of her UTI.  PVRs have remained low.  No other concerns, complaints.  Moving well with a walker and practiced with use of a cane this a.m.  LBM 3/12.   ROS: Positive for urinary urgency-improving, dysuria-resolved, and diarrhea-resolved..  Patient denies fever, rash, sore throat, blurred vision, dizziness, nausea, vomiting,  cough, shortness of breath or chest pain,  headache, or mood change.   Objective:   No results found. Recent Labs    12/05/22 0540  WBC 6.8  HGB 12.2  HCT 38.4  PLT 146*    Recent Labs    12/05/22 0540 12/06/22 0524  NA 140 140  K 3.8 3.8  CL 99 101  CO2 31 27  GLUCOSE 93 89  BUN 26* 27*  CREATININE 1.46* 1.50*  CALCIUM 9.0 8.9     Intake/Output Summary (Last 24 hours) at 12/06/2022 1004 Last data filed at 12/06/2022 0700 Gross per 24 hour  Intake 828 ml  Output --  Net 828 ml         Physical Exam: Vital Signs Blood pressure 114/63, pulse 88, temperature 98.6 F (37 C), temperature source Oral, resp. rate 17, height '5\' 8"'$  (1.727 m), weight 117 kg, last menstrual period 02/07/2012, SpO2 95 %. Constitutional: No distress . Vital signs reviewed. HEENT: NCAT, EOMI, oral membranes moist Neck: supple Cardiovascular: RRR with sys murmur. No JVD    Respiratory/Chest: CTA Bilaterally without wheezes or rales. Normal effort    GI/Abdomen: BS +, non-tender, non-distended Ext: no clubbing, cyanosis  Psych: pleasant and cooperative  Skin: Multiple bruises on bilateral cheeks, arm, and knees. -Healing Chronic venous stasis changed BL LE L>R. Raised polyps on L shin and medial ankle.  MSK:      No apparent deformity.      Strength: BL UE 4/5 grossly LLE 3/5 HF, 3/5 KE, 4/5 DF, 4/5 PF--unchanged RLE  4/5 HF, 4/5 KE, 5/5 DF, 5/5 PF--unchanged Ambulating with contact guard assist with rolling walker, good clearance.  Neurologic exam:  DTRs: Reflexes were 2+ in bilateral Ues; negative in BL patella d/t TKR. Babinsky: flexor responses b/l.   Hoffmans: negative b/l Sensory exam: revealed normal sensation in all dermatomal regions in bilateral UE and LLE; RLE sensory deficit in distal plantar foot to mid-shin. Coordination: + Ataxia on FTN in bilateral UE, L>R Mild STM deficits, sl decreased insight and processing speed    Assessment/Plan: 1. Functional deficits which require 3+ hours per day of interdisciplinary therapy in a comprehensive inpatient rehab setting. Physiatrist is providing close team supervision and 24 hour management of active medical problems listed below. Physiatrist and rehab team continue to assess barriers to discharge/monitor patient progress toward functional and medical goals  Care Tool:  Bathing    Body parts bathed by patient: Right arm, Left arm, Chest, Abdomen, Right upper leg, Left upper leg, Face   Body parts bathed by helper: Front perineal area, Buttocks, Right lower leg, Left lower leg     Bathing assist Assist Level: 2 Helpers  Upper Body Dressing/Undressing Upper body dressing   What is the patient wearing?: Pull over shirt, Bra    Upper body assist Assist Level: Minimal Assistance - Patient > 75%    Lower Body Dressing/Undressing Lower body dressing      What is the patient wearing?: Underwear/pull up, Pants     Lower body assist Assist for lower body dressing: 2 Helpers     Toileting Toileting    Toileting assist Assist for toileting: 2 Helpers     Transfers Chair/bed transfer  Transfers assist  Chair/bed transfer activity did not occur: Safety/medical concerns  Chair/bed transfer assist level: 2 Helpers     Locomotion Ambulation   Ambulation assist      Assist level: Contact Guard/Touching assist Assistive  device: Walker-rolling Max distance: 21f   Walk 10 feet activity   Assist     Assist level: Contact Guard/Touching assist Assistive device: Walker-rolling   Walk 50 feet activity   Assist Walk 50 feet with 2 turns activity did not occur: Safety/medical concerns         Walk 150 feet activity   Assist Walk 150 feet activity did not occur: Safety/medical concerns         Walk 10 feet on uneven surface  activity   Assist Walk 10 feet on uneven surfaces activity did not occur: Safety/medical concerns         Wheelchair     Assist Is the patient using a wheelchair?: Yes Type of Wheelchair: Manual    Wheelchair assist level: Dependent - Patient 0% Max wheelchair distance: 150'    Wheelchair 50 feet with 2 turns activity    Assist        Assist Level: Dependent - Patient 0%   Wheelchair 150 feet activity     Assist      Assist Level: Dependent - Patient 0%   Blood pressure 114/63, pulse 88, temperature 98.6 F (37 C), temperature source Oral, resp. rate 17, height '5\' 8"'$  (1.727 m), weight 117 kg, last menstrual period 02/07/2012, SpO2 95 %. Medical Problem List and Plan: 1. 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 Per PT, walked 250 ft CGA with a walker. Mid-Mod A for Cog, not expecting Mod I at home for cog. Will need aide at home but progressing very well functionally. `  2.  Antithrombotics: -DVT/anticoagulation:  Mechanical:  Antiembolism stockings, knee (TED hose) Bilateral lower extremities>>patient states she refuses compression stockings; can ACE wrap when OOB  - Was on Eliquis 5 mg BID PTA, Will need to clear resumption with Dr. CChristella Noafor discharge vs. OP with repeat  imaging             - antiplatelet therapy: none   3. Pain Management: Tylenol as needed   4. Mood/Behavior/Sleep: LCSW to evaluate and provide emotional support             -antipsychotic agents: n/a   -will add low dose melatonin tonight for sleep 3/10   -has trazodone prn which hasn't been used  5. Neuropsych/cognition: This patient is capable of making  decisions on her own behalf.   6. Skin/Wound Care: Routine skin care checks   - 3/10: ?cellulitis LLE -improved. No signs of active infection; self resolved without treatment    7. Fluids/Electrolytes/Nutrition: Strict Is and Os and follow-up chemistries             -fluid restriction <2 liters             -continue KCl             -continue B12   - admission labs mag, k stable   - Decreasing PO mag from 800 mg BID to Daily 3/9; repeat on Monday- -added onto a.m. 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:              -fluid restriction <2 liters             -daily weight (dry weight 270 lbs per OP meds)             -continue Jardiance 10 mg daily             -continue spironolactone 25 mg daily             -continue irbesartan 150 mg daily             -continue magnesium oxide 400 mg BID -> daily 3/9             -continue KCl 10 mEq daily Filed Weights   12/05/22 0316 12/05/22 0500 12/06/22 0514  Weight: 120.2 kg 121.6 kg 117 kg   -3/10 weights appear to be down-trending, now on lasix '40mg'$  bid - 3/11: Weight stable, on torsemide 40 mg daily, continue following cardiology recommendations which are much  appreciated - 3.12: Weight down significantly overnight, likely incorrect reading, monitor.  Cardiology signed off with outpatient follow-up    12: Chronic BLE edema/lymphedema:              -Unable to tolerate TED hose; may do well with ACE wrapping, d/w therapies for AM   -elevate legs while in bed  13: Permanent atrial fibrillation:             -continue Lopressor 50 mg BID             -DOAC when cleared by NS   14: OSA; does not use CPAP             -sleep medicine follow-up as outpatient   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   - 3/11: Creatinine is stable at 1.4, per cardiology likely unveiling prior worsening CKD now that she is at a normal volume, continue to monitor.  - 3/12: Creatinine  stable 1.5, monitor   16. Vertigo/Dizziness.  -Stable  - Vestibular eval ordered - still pending - Meclizine 12.5 mg TID PRN  17.  Dysuria/urinary urgency.  Urgency preexisted current diuretic regimen, was untreated.-Improved -Urinalysis pending -Start Myrbetriq 25 mg daily -PVRs twice per to shift x 3 days to ensure no retention  -3/12: Much improved with the above.  Urinalysis showed UTI, started on Keflex for 7 days, culture pending.  Continue antibiotics for an additional 24 hours, monitor PVRs prior to adjusting Myrbetriq further.  18.  Diarrhea -Continue Colace twice daily, move Senokot-S from nightly to as needed -Last bowel movement 3-12, improved.  LOS: 5 days A FACE TO FACE EVALUATION WAS PERFORMED  Gertie Gowda 12/06/2022, 10:04 AM

## 2022-12-06 NOTE — Progress Notes (Signed)
Speech Language Pathology TBI Note  Patient Details  Name: [name] MRN: [id number] Date of Birth: June 28, 1941  Today's Date: 12/06/2022 SLP Individual Time: 0800-0900 SLP Individual Time Calculation (min): 60 min  Short Term Goals: Week 1: SLP Short Term Goal 1 (Week 1): Patient will implement word finding strategies to overcome communication breakdowns with min A verbal cues for effectiveness SLP Short Term Goal 2 (Week 1): Patient will complete financial management tasks (e.g., check writing) with min-to-mod A verbal/visual cues SLP Short Term Goal 3 (Week 1): Patient will complete medicaiton management tasks (e.g., pillbox organization) with min-to-mod A verbal/visual cues SLP Short Term Goal 4 (Week 1): Patient will complete calendar organization tasks with min-to-mod A verbal/visual cues SLP Short Term Goal 5 (Week 1): Patient will utilize beneficial memory compensations to recall novel information and/or to assist with recall of to-do lists, appointments, etc with min-to-mod A verbal/visual cues  Skilled Therapeutic Interventions:   Pt was seen in am to address cognitive re- training. Pt was alert and seen at bedside upon SLP arrival. SLP addressed implementation of memory strategies, recall of functional and novel information, and financial management tasks. SLP provided training in WRAP compensatory strategies. She was also provided with paper to facilitate use of writing information down in current setting. SLP assisted pt in identifying relevant information including date of hospitalization and date of admission to CIR. Education completed to pt on examples of utilization during CIR stay. Given novel information presented verbally, SLP guided pt in utilization of repetition and association strategies from aforementioned WRAP strategies. After a 10 minute delay, pt recalled information with 100% acc indep. SLP also addressed financial management through structured tasks. Pt challenged to  interpret hypothetical phone bill which she completed with 80% acc indep improving to 100% with min A.   Pt left seated at bedside with call button within reach and bed alarm active. SLP to continue POC.     Pain  No pain  Agitated Behavior Scale: TBI Observation Details Observation Environment: CIR Start of observation period - Date: 12/06/22 Start of observation period - Time: 0800 End of observation period - Date: 12/06/22 End of observation period - Time: 0900 Agitated Behavior Scale (DO NOT LEAVE BLANKS) Short attention span, easy distractibility, inability to concentrate: Absent Impulsive, impatient, low tolerance for pain or frustration: Absent Uncooperative, resistant to care, demanding: Absent Violent and/or threatening violence toward people or property: Absent Explosive and/or unpredictable anger: Absent Rocking, rubbing, moaning, or other self-stimulating behavior: Absent Pulling at tubes, restraints, etc.: Absent Wandering from treatment areas: Absent Restlessness, pacing, excessive movement: Absent Repetitive behaviors, motor, and/or verbal: Absent Rapid, loud, or excessive talking: Absent Sudden changes of mood: Absent Easily initiated or excessive crying and/or laughter: Absent Self-abusiveness, physical and/or verbal: Absent Agitated behavior scale total score: 14  Therapy/Group: Individual Therapy  Colin Benton 12/06/2022, 9:58 AM

## 2022-12-06 NOTE — Progress Notes (Signed)
Physical Therapy Session Note  Patient Details  Name: [name] MRN: [id number] Date of Birth: 01-03-1941  Today's Date: 12/06/2022 PT Individual Time: 1120-1200 PT Individual Time Calculation (min): 40 min   Short Term Goals: Week 1:  PT Short Term Goal 1 (Week 1): Pt will complete bed mobility with CGA. PT Short Term Goal 2 (Week 1): Pt will complete bed to chair transfer consistently with CGA. PT Short Term Goal 3 (Week 1): Pt will ambulate x50' with minA and LRAD. PT Short Term Goal 4 (Week 1): Pt will complete x8 steps with bilateral hand rails and minA.  Skilled Therapeutic Interventions/Progress Updates:     Pt received seated in [facility] and agrees to therapy. NO complaint of pain but does report "wooziness' in head. Pt now scheduled for vestibular eval on 3/13. Pt performs sit to stand with RW and cues for hand placement. Pt ambulates x150' with bariatric RW and CGA, with cues for upright gaze to improve posture and balance. Cues also to increase gait speed to decrease risk for falls. Pt noted to stop ambulating when there are other people nearby, saying she gets nervous about falling. Pt takes extended seated rest break. Pt attempts ambulation with quad cane in R upper extremity and requires minA overall with notable increase in anxiety about falling and deterioration of gait pattern. Following rest break, pt completes standing toe taps on 8 inch step with bilateral upper extremity support on RW, x10 with each foot, with cues for body mechanics and increased hip abductor activation on stance leg. Following rest breaks, pt progresses activity by performing toe taps with R upper extremity support and then L upper extremity support, requiring CGA/minA for stability. Pt ambulates back to room with CGA and same cues. Left seated in WC with all needs within reach.  Therapy Documentation Precautions:  Precautions Precautions: Fall Restrictions Weight Bearing Restrictions: No   Therapy/Group:  Individual Therapy  Breck Coons, PT, DPT 12/06/2022, 12:10 PM

## 2022-12-06 NOTE — Plan of Care (Signed)
Behavioral Plan   Rancho Level: Rancho 7  Behavior to decrease/ eliminate:  Staying in bed  Changes to environment:  -Lights on, blinds open during the day; off and closed at night  Interventions: Encourage OOB toileting Offer toileting with every encounter  Chair alarm  Call bell in reach  Recommendations for interactions with patient: Encourage sitting up between sessions Execute safety plan   Attendees:  [place] A PT Jae Dire OT Shari Prows RN

## 2022-12-07 DIAGNOSIS — I609 Nontraumatic subarachnoid hemorrhage, unspecified: Secondary | ICD-10-CM | POA: Diagnosis not present

## 2022-12-07 NOTE — Progress Notes (Signed)
PROGRESS NOTE   Subjective/Complaints:  NO acute complaints. No events overnight. Urinary urgency much improved, no further dysuria. Cultures pending.   ROS: Positive for urinary urgency-improving, dysuria-resolved, and diarrhea-resolved..  Patient denies fever, rash, sore throat, blurred vision, dizziness, nausea, vomiting,  cough, shortness of breath or chest pain,  headache, or mood change.   Objective:   No results found. Recent Labs    12/05/22 0540  WBC 6.8  HGB 12.2  HCT 38.4  PLT 146*    Recent Labs    12/05/22 0540 12/06/22 0524  NA 140 140  K 3.8 3.8  CL 99 101  CO2 31 27  GLUCOSE 93 89  BUN 26* 27*  CREATININE 1.46* 1.50*  CALCIUM 9.0 8.9     Intake/Output Summary (Last 24 hours) at 12/07/2022 2130 Last data filed at 12/07/2022 1330 Gross per 24 hour  Intake 440 ml  Output 0 ml  Net 440 ml         Physical Exam: Vital Signs Blood pressure (!) 140/66, pulse 87, temperature 98.1 F (36.7 C), temperature source Oral, resp. rate 18, height '5\' 8"'$  (1.727 m), weight 116.5 kg, last menstrual period 02/07/2012, SpO2 96 %. Constitutional: No distress . Vital signs reviewed. HEENT: NCAT, EOMI, oral membranes moist Neck: supple Cardiovascular: RRR with sys murmur. No JVD    Respiratory/Chest: CTA Bilaterally without wheezes or rales. Normal effort    GI/Abdomen: BS +, non-tender, non-distended Ext: no clubbing, cyanosis  Psych: pleasant and cooperative  Skin: Multiple bruises on bilateral cheeks, arm, and knees. - improved Chronic venous stasis changed BL LE L>R. Raised polyps on L shin and medial ankle.  - uinchanged MSK:      No apparent deformity.      Strength: All 4 limbs antigravity and against resistance  Neurologic exam:  AAOx3. +Mild processing delay Sensory exam: RLE sensory deficit in distal plantar foot to mid-shin. Coordination: + Ataxia on FTN in bilateral UE, L>R - improved     Assessment/Plan: 1. Functional deficits which require 3+ hours per day of interdisciplinary therapy in a comprehensive inpatient rehab setting. Physiatrist is providing close team supervision and 24 hour management of active medical problems listed below. Physiatrist and rehab team continue to assess barriers to discharge/monitor patient progress toward functional and medical goals  Care Tool:  Bathing    Body parts bathed by patient: Right arm, Left arm, Chest, Abdomen, Right upper leg, Left upper leg, Face   Body parts bathed by helper: Front perineal area, Buttocks, Right lower leg, Left lower leg     Bathing assist Assist Level: 2 Helpers     Upper Body Dressing/Undressing Upper body dressing   What is the patient wearing?: Pull over shirt, Bra    Upper body assist Assist Level: Minimal Assistance - Patient > 75%    Lower Body Dressing/Undressing Lower body dressing      What is the patient wearing?: Underwear/pull up, Pants     Lower body assist Assist for lower body dressing: 2 Helpers     Toileting Toileting    Toileting assist Assist for toileting: 2 Helpers     Transfers Chair/bed transfer  Transfers assist  Chair/bed transfer activity did not occur: Safety/medical concerns  Chair/bed transfer assist level: 2 Helpers     Locomotion Ambulation   Ambulation assist      Assist level: Contact Guard/Touching assist Assistive device: Walker-rolling Max distance: 30f   Walk 10 feet activity   Assist     Assist level: Contact Guard/Touching assist Assistive device: Walker-rolling   Walk 50 feet activity   Assist Walk 50 feet with 2 turns activity did not occur: Safety/medical concerns         Walk 150 feet activity   Assist Walk 150 feet activity did not occur: Safety/medical concerns         Walk 10 feet on uneven surface  activity   Assist Walk 10 feet on uneven surfaces activity did not occur: Safety/medical  concerns         Wheelchair     Assist Is the patient using a wheelchair?: Yes Type of Wheelchair: Manual    Wheelchair assist level: Dependent - Patient 0% Max wheelchair distance: 150'    Wheelchair 50 feet with 2 turns activity    Assist        Assist Level: Dependent - Patient 0%   Wheelchair 150 feet activity     Assist      Assist Level: Dependent - Patient 0%   Blood pressure (!) 140/66, pulse 87, temperature 98.1 F (36.7 C), temperature source Oral, resp. rate 18, height '5\' 8"'$  (1.727 m), weight 116.5 kg, last menstrual period 02/07/2012, SpO2 96 %. Medical Problem List and Plan: 1. 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 Per PT, walked 250 ft CGA with a walker. Mid-Mod A for Cog, not expecting Mod I at home for cog. Will need aide at home but progressing very well functionally. `  2.  Antithrombotics: -DVT/anticoagulation:  Mechanical:  Antiembolism stockings, knee (TED hose) Bilateral lower extremities>>patient states she refuses compression stockings; can ACE wrap when OOB  - Was on Eliquis 5 mg BID PTA, Will need to clear resumption with Dr. CChristella Noafor discharge vs. OP with repeat imaging             - antiplatelet therapy: none   3. Pain Management: Tylenol as needed   4. Mood/Behavior/Sleep: LCSW to evaluate and provide emotional support             -antipsychotic agents: n/a   -will add low dose melatonin tonight for sleep 3/10   -has trazodone prn which hasn't been used  5. Neuropsych/cognition: This patient is capable of making decisions on her own behalf.   6. Skin/Wound Care: Routine skin care checks   - 3/10: ?cellulitis LLE -improved. No signs of active infection; self  resolved without treatment    7. Fluids/Electrolytes/Nutrition: Strict Is and Os and follow-up chemistries             -fluid restriction <2 liters             -continue KCl             -continue B12   - admission labs mag, k  stable   - Decreasing PO mag from 800 mg BID to Daily 3/9; repeat on Monday- -added onto a.m. 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:              -fluid restriction <2 liters             -daily weight (dry weight 270 lbs per OP meds)             -continue Jardiance 10 mg daily             -continue spironolactone 25 mg daily             -continue irbesartan 150 mg daily             -continue magnesium oxide 400 mg BID -> daily 3/9             -continue KCl 10 mEq daily Filed Weights   12/05/22 0500 12/06/22 0514 12/07/22 0430  Weight: 121.6 kg 117 kg 116.5 kg   -3/10 weights appear to be down-trending, now on lasix '40mg'$  bid - 3/11: Weight stable, on torsemide 40 mg daily, continue following cardiology recommendations which are much appreciated - 3/12-13: Weight down significantly, BMP stable, monitor for s/s orthostasis    12: Chronic BLE edema/lymphedema:              -Unable to tolerate TED hose; may do well with ACE wrapping, d/w therapies for AM   -elevate legs while in bed  13: Permanent atrial fibrillation:             -continue Lopressor 50 mg BID             -DOAC when cleared by NS   14: OSA; does not use CPAP             -sleep medicine follow-up as outpatient   15: CKD stage 3a, prior baseline Cr appears 1.0-1.2 ; per  cardiology likely unveiling prior worsening CKD now that she is at a normal volume, stabilizing at 1.4-1.5             -follow-up BMP   - 2/8: Admission labs elevated Cr 1.49; touching base with cardiology regarding wean from IV lasix, encourage PO fluids with restriction <2L daily.   - 3/11: Creatinine is stable at 1.4  - 3/12: Creatinine stable 1.5, repeat 3/15   16. Vertigo/Dizziness.  -Stable  - Vestibular eval ordered - still pending - Meclizine 12.5 mg TID PRN  17.  Dysuria/urinary urgency.  Urgency preexisted current diuretic regimen, was untreated.-Improved -Urinalysis pending -Start Myrbetriq 25 mg daily -PVRs twice per to shift x 3 days to ensure no retention  -3/12: Much improved with the above.  Urinalysis showed UTI, started on Keflex for 7 days, culture pending.  Continue antibiotics for an additional 24 hours, monitor PVRs prior to adjusting Myrbetriq further. - 3/13: 1x high ?PVR vs bladder scan  250s; otherwise 0, with much improved urgency and frequency compared even to baseline; monitor on Myrbetriq at current dose  18.  Diarrhea - resolved -Continue Colace twice daily, move Senokot-S from nightly to as needed -Last bowel movement 3-12, improved.  LOS: 6 days A FACE TO [place] 12/07/2022, 9:30 PM

## 2022-12-07 NOTE — Progress Notes (Signed)
Occupational Therapy TBI Note  Patient Details  Name: [name] MRN: [id number] Date of Birth: [birth year]  Today's Date: 12/07/2022 OT Individual Time: [id number] OT Individual Time Calculation (min): 45 min    Short Term Goals: Week 1:  OT Short Term Goal 1 (Week 1): Pt will stand to groom at sink to demo improved activity tolerance OT Short Term Goal 2 (Week 1): Pt will complete SPT wiht LRAD and MIN A OT Short Term Goal 3 (Week 1): Pt will complete 2/3 steps of toileting with MIN A for balance OT Short Term Goal 4 (Week 1): Pt will be A&O x 4 with compensatory strategies PRN  Skilled Therapeutic Interventions/Progress Updates:    Pt received in bed with no pain  ADL: Pt completes ADL at overall CGA Level. Skilled interventions include: Cuing and education on [facility] over toilet to improve abiality to push up to stand as pt does not have grab bar. Increased A MIN A to push up from low toilet as BSC not over toilet. Set up to don shirt and stand at sink for grooming with S overall.    Therapeutic activity Pt completes kitchen search into various height cabinets/appliaces in prep for IADL retraining from ambulatory level with MIN cuing for safety/positioning throughout environment. Pt uses Rw tray AE to improve reach/safety and transportation of items with education on energy conservation techniques throughout activity.   Pt left at end of session in w/c with NT aware of positioning and need to toilet frequently d/t lasix, call light in reach and all needs met   Therapy Documentation Precautions:  Precautions Precautions: Fall Restrictions Weight Bearing Restrictions: No General:   Vital Signs:  Pain:   Agitated Behavior Scale: TBI ABS discontinued d/t ABS score less than 20 for the last three days or no behaviors present        Therapy/Group: Individual Therapy  Tonny Branch 12/07/2022, 6:47 AM

## 2022-12-07 NOTE — Progress Notes (Signed)
Physical Therapy Session Note  Patient Details  Name: [name] MRN: [id number] Date of Birth: 06/30/1941  Today's Date: 12/07/2022 PT Individual Time: 1330-1359 PT Individual Time Calculation (min): 29 min   Short Term Goals: Week 1:  PT Short Term Goal 1 (Week 1): Pt will complete bed mobility with CGA. PT Short Term Goal 2 (Week 1): Pt will complete bed to chair transfer consistently with CGA. PT Short Term Goal 3 (Week 1): Pt will ambulate x50' with minA and LRAD. PT Short Term Goal 4 (Week 1): Pt will complete x8 steps with bilateral hand rails and minA.  Skilled Therapeutic Interventions/Progress Updates:     Pt received seated in [facility] and agrees to therapy. No complaint of pain. Pt perform sit to stand with CGA and cues for hand placement and initiation. Pt ambulates x150' to gym with RW and cues for upright gaze and increased stride length to decrease risk for falls. Seated rest break. Pt performs forward and backward ambulation with RW and mirror for visual feedback. Pt cued to utilize upright gaze in mirror to assist with balance. Completed for balance and high level gait training. Seated rest break. Pt then performs side stepping in parallel bars, facing mirror with both hands on bar for upper extremity support. Pt sidesteps L and R x30' total, then progresses to only R upper extremity support, then only L upper extremity support, then no upper extremity support (pt leaning on bar slightly with anterior abdomen). Completed for balance training and strengthening of hip abductors. Activity progressed by having pt side step over 6 inch cones, with bilateral upper extremity support. PT provides cues for step sequencing and body mechanics. Pt ambulates back to room with RW. Let seated in [facility] with all needs within reach.   Therapy Documentation Precautions:  Precautions Precautions: Fall Restrictions Weight Bearing Restrictions: No   Therapy/Group: Individual Therapy  Breck Coons,  PT, DPT 12/07/2022, 5:15 PM

## 2022-12-07 NOTE — Progress Notes (Signed)
Speech Language Pathology TBI Note  Patient Details  Name: [name] MRN: [id number] Date of Birth: 02-Dec-1940  Today's Date: 12/07/2022 SLP Individual Time: 0930-1030 SLP Individual Time Calculation (min): 60 min  Short Term Goals: Week 1: SLP Short Term Goal 1 (Week 1): Patient will implement word finding strategies to overcome communication breakdowns with min A verbal cues for effectiveness SLP Short Term Goal 2 (Week 1): Patient will complete financial management tasks (e.g., check writing) with min-to-mod A verbal/visual cues SLP Short Term Goal 3 (Week 1): Patient will complete medicaiton management tasks (e.g., pillbox organization) with min-to-mod A verbal/visual cues SLP Short Term Goal 4 (Week 1): Patient will complete calendar organization tasks with min-to-mod A verbal/visual cues SLP Short Term Goal 5 (Week 1): Patient will utilize beneficial memory compensations to recall novel information and/or to assist with recall of to-do lists, appointments, etc with min-to-mod A verbal/visual cues  Skilled Therapeutic Interventions: Skilled treatment session focused on cognitive goals. Upon arrival, patient was awake while upright in the wheelchair. SLP facilitated session by providing overall Min A verbal cues and extra time for complex organization and problem solving during a scheduling task. Patient recalled functional information regarding treatment goals and progress with overall supervision level verbal cues. SLP initiated a functional task regarding recall of current medications and their functions in which she required Max verbal cues. Patient left upright in the wheelchair with alarm on and all needs within reach. Continue with current plan of care.      Pain No/Denies Pain  Agitated Behavior Scale: TBI Observation Details Observation Environment: CIR Start of observation period - Date: 12/07/22 Start of observation period - Time: 0930 End of observation period - Date:  12/07/22 End of observation period - Time: 1030 Agitated Behavior Scale (DO NOT LEAVE BLANKS) Short attention span, easy distractibility, inability to concentrate: Absent Impulsive, impatient, low tolerance for pain or frustration: Absent Uncooperative, resistant to care, demanding: Absent Violent and/or threatening violence toward people or property: Absent Explosive and/or unpredictable anger: Absent Rocking, rubbing, moaning, or other self-stimulating behavior: Absent Pulling at tubes, restraints, etc.: Absent Wandering from treatment areas: Absent Restlessness, pacing, excessive movement: Absent Repetitive behaviors, motor, and/or verbal: Absent Rapid, loud, or excessive talking: Absent Sudden changes of mood: Absent Easily initiated or excessive crying and/or laughter: Absent Self-abusiveness, physical and/or verbal: Absent Agitated behavior scale total score: 14  Therapy/Group: Individual Therapy  Tung Pustejovsky 12/07/2022, 12:42 PM

## 2022-12-07 NOTE — Progress Notes (Signed)
Physical Therapy Session Note  Patient Details  Name: [name] MRN: [id number] Date of Birth: July 23, 1941  Today's Date: 12/07/2022 PT Individual Time: 1110-1207 PT Individual Time Calculation (min): 57 min   Short Term Goals: Week 1:  PT Short Term Goal 1 (Week 1): Pt will complete bed mobility with CGA. PT Short Term Goal 2 (Week 1): Pt will complete bed to chair transfer consistently with CGA. PT Short Term Goal 3 (Week 1): Pt will ambulate x50' with minA and LRAD. PT Short Term Goal 4 (Week 1): Pt will complete x8 steps with bilateral hand rails and minA.  Skilled Therapeutic Interventions/Progress Updates:    Pt received sitting in w/c and agreeable to therapy session and agreeable to plan for vestibular evaluation.  Pt reports she has been having frequent headaches (top front of head) making her want to place hand on head for comfort and states this "type" of headache started after the fall. Headache primarily feels like "dull ache" or can be "pounding". Pt reports she has been experiencing "whooziness/dizziness" when turning head or having increased movement in her visual field (Ex: her daughter had dangling earrings on that caused pt to feel whoozy or when pt in a busy environment) states she can also have it when moving eyes too quickly - all of this started after her fall.   Pt reports family hx of brain aneurysm (daughter and mother).  Pt reports no hx of cervical injury and no current pain.  Cervical AROM WNL but pt moves very slow, guardedly to avoid onset of dizziness.   Smooth pursuit: noticed slight increased eye shift in R eye during tracking but very subtle Horizontal saccades: noticed extra eye shift (similar to undershooting/overshooting) R eye and pt reports onset of cricking headache along forehead with this  Vertical saccades: noticed slight extra eye shifts when pt looking up in BOTH eyes - continues to cause exacerbation of cracking headache along forehead  Pt  denies onset of dizziness/whooziness with any of these.  VOR cancellation test: pt reports onset of dizziness/whooziness with this rated on severity as 7/10 (0-10 scale) - no obvious increased eye shifts/movements with this but possibly with L eye  Head impulse test: noticed when impulse towards L the R eye had 2-3 beat horizontal nystagmus 1x but otherwise pt had to make corrective eye movements in BOTH eyes to find target after impulse towards the L indicating possible L hypofunction  Head Shaking Nystagmus test: no nystagmus noted but pt does report onset of dizziness again rated at 7/10  Pt reports that she has noticed when she is reading or eating she is not seeing the full sentence or is not seeing all of the items on her plate but that is improving.   Sitting: BP 126/68 (MAP 85), HR 87bpm   Therapist recommending gaze stabilization and habituation exercises. Educated pt on starting exercises in seated position in non-busy environment with plane background.  Provided pt with X1 and X2 gaze stabilization exercises with HEP printout as described below. Pt demonstrated understanding of the exercises.   Access Code: [email or web address] URL: [email or web address] Date: 12/07/2022 Prepared by: Page Spiro  Exercises - Seated Gaze Stabilization with Head Rotation  - 1 x daily - 7 x weekly - 3 sets - 10 reps - Seated Back and Forth Eye Movements WITHOUT head rotation  - 1 x daily - 7 x weekly - 3 sets - 10 reps  Recommend follow-up OPPT to further address this and will see if scheduling allows  this therapist to follow-up with pt prior to D/C. Pt left seated in w/c with needs in reach.   Therapy Documentation Precautions:  Precautions Precautions: Fall Restrictions Weight Bearing Restrictions: No   Pain: No reports of pain throughout session.    Therapy/Group: Individual Therapy  Tawana Scale , PT, DPT, NCS, CSRS 12/07/2022, 7:50 AM

## 2022-12-08 DIAGNOSIS — I609 Nontraumatic subarachnoid hemorrhage, unspecified: Secondary | ICD-10-CM | POA: Diagnosis not present

## 2022-12-08 NOTE — Progress Notes (Signed)
PROGRESS NOTE   Subjective/Complaints:  NO acute complaints. No events overnight.   Per PT, positive HEENT exam for some left-sided hypoattenuation likely contributing to vertiginous symptoms.  Discussed this with patient, will need further workup with outpatient.  Has meclizine as needed available.  Patient continuing to decrease weight, states she feels good, no signs or symptoms of orthostasis.    ROS: + Vertigo -ongoing , urinary urgency-improving, dysuria-resolved, and diarrhea-resolved..  Patient denies fever, rash, sore throat, blurred vision, dizziness, nausea, vomiting,  cough, shortness of breath or chest pain,  headache, or mood change.   Objective:   No results found. No results for input(s): "WBC", "HGB", "HCT", "PLT" in the last 72 hours.  Recent Labs    12/06/22 0524  NA 140  K 3.8  CL 101  CO2 27  GLUCOSE 89  BUN 27*  CREATININE 1.50*  CALCIUM 8.9     Intake/Output Summary (Last 24 hours) at 12/08/2022 0909 Last data filed at 12/07/2022 2023 Gross per 24 hour  Intake 400 ml  Output 0 ml  Net 400 ml         Physical Exam: Vital Signs Blood pressure (!) 123/59, pulse 91, temperature 98.6 F (37 C), temperature source Oral, resp. rate 20, height '5\' 8"'$  (1.727 m), weight 115.1 kg, last menstrual period 02/07/2012, SpO2 95 %. Constitutional: No distress . Vital signs reviewed. HEENT: NCAT, EOMI, oral membranes moist Neck: supple Cardiovascular: RRR with sys murmur. No JVD    Respiratory/Chest: CTA Bilaterally without wheezes or rales. Normal effort    GI/Abdomen: BS +, non-tender, non-distended Ext: no clubbing, cyanosis  Psych: pleasant and cooperative  Skin: Multiple bruises on bilateral cheeks, arm, and knees. - improved Chronic venous stasis changed BL LE L>R. Raised polyps on L shin and medial ankle.  - uinchanged MSK:      No apparent deformity.      Strength: All 4 limbs antigravity  and against resistance  Neurologic exam:  AAOx3. +Mild processing delay Sensory exam: RLE sensory deficit in distal plantar foot to mid-shin. Coordination: + Ataxia on FTN in bilateral UE, L>R - improved    Assessment/Plan: 1. Functional deficits which require 3+ hours per day of interdisciplinary therapy in a comprehensive inpatient rehab setting. Physiatrist is providing close team supervision and 24 hour management of active medical problems listed below. Physiatrist and rehab team continue to assess barriers to discharge/monitor patient progress toward functional and medical goals  Care Tool:  Bathing    Body parts bathed by patient: Right arm, Left arm, Chest, Abdomen, Right upper leg, Left upper leg, Face   Body parts bathed by helper: Front perineal area, Buttocks, Right lower leg, Left lower leg     Bathing assist Assist Level: 2 Helpers     Upper Body Dressing/Undressing Upper body dressing   What is the patient wearing?: Pull over shirt, Bra    Upper body assist Assist Level: Minimal Assistance - Patient > 75%    Lower Body Dressing/Undressing Lower body dressing      What is the patient wearing?: Underwear/pull up, Pants     Lower body assist Assist for lower body dressing: 2 Helpers  Toileting Toileting    Toileting assist Assist for toileting: 2 Helpers     Transfers Chair/bed transfer  Transfers assist  Chair/bed transfer activity did not occur: Safety/medical concerns  Chair/bed transfer assist level: 2 Biomedical engineer   Ambulation assist      Assist level: Contact Guard/Touching assist Assistive device: Walker-rolling Max distance: 93f   Walk 10 feet activity   Assist     Assist level: Contact Guard/Touching assist Assistive device: Walker-rolling   Walk 50 feet activity   Assist Walk 50 feet with 2 turns activity did not occur: Safety/medical concerns         Walk 150 feet activity   Assist  Walk 150 feet activity did not occur: Safety/medical concerns         Walk 10 feet on uneven surface  activity   Assist Walk 10 feet on uneven surfaces activity did not occur: Safety/medical concerns         Wheelchair     Assist Is the patient using a wheelchair?: Yes Type of Wheelchair: Manual    Wheelchair assist level: Dependent - Patient 0% Max wheelchair distance: 150'    Wheelchair 50 feet with 2 turns activity    Assist        Assist Level: Dependent - Patient 0%   Wheelchair 150 feet activity     Assist      Assist Level: Dependent - Patient 0%   Blood pressure (!) 123/59, pulse 91, temperature 98.6 F (37 C), temperature source Oral, resp. rate 20, height '5\' 8"'$  (1.727 m), weight 115.1 kg, last menstrual period 02/07/2012, SpO2 95 %. Medical Problem List and Plan: 1. 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 Per PT, walked 250 ft CGA with a walker. Mid-Mod A for Cog, not expecting Mod I at home for cog. Will need aide at home but progressing very well functionally. `  2.  Antithrombotics: -DVT/anticoagulation:  Mechanical:  Antiembolism stockings, knee (TED hose) Bilateral lower extremities>>patient states she refuses compression stockings; can ACE wrap when OOB  - Was on Eliquis 5 mg BID PTA, Will need to clear resumption with Dr. CChristella Noafor discharge vs. OP with repeat imaging             - antiplatelet therapy: none   3. Pain Management: Tylenol as needed   4. Mood/Behavior/Sleep: LCSW to evaluate and provide emotional support             -antipsychotic agents: n/a   -will add low dose melatonin tonight for sleep 3/10   -has trazodone prn which hasn't been used  5.  Neuropsych/cognition: This patient is capable of making decisions on her own behalf.   6. Skin/Wound Care: Routine skin care checks   - 3/10: ?cellulitis LLE -improved. No signs of active infection; self resolved without treatment    7. Fluids/Electrolytes/Nutrition: Strict Is and Os and follow-up chemistries             -fluid restriction <2 liters             -  continue KCl             -continue B12   - admission labs mag, k stable   - Decreasing PO mag from 800 mg BID to Daily 3/9; repeat on Monday- -added onto a.m. 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:              -fluid restriction <2 liters             -daily weight (dry weight 270 lbs per OP meds)             -continue Jardiance 10 mg daily             -continue spironolactone 25 mg daily             -continue irbesartan 150 mg daily             -continue magnesium oxide 400 mg BID -> daily 3/9             -continue KCl 10 mEq daily Filed Weights   12/06/22 0514 12/07/22 0430 12/08/22 0532  Weight: 117 kg 116.5 kg 115.1 kg   -3/10 weights appear to be down-trending, now on lasix '40mg'$  bid - 3/11: Weight stable, on torsemide 40 mg daily, continue following cardiology recommendations which are much appreciated - 3/12-14: Weight down significantly, BMP stable, monitor for s/s orthostasis    12: Chronic BLE edema/lymphedema:              -Unable to tolerate TED hose; may do well with ACE wrapping, d/w therapies for AM   -elevate legs while in bed  13: Permanent atrial fibrillation:             -continue  Lopressor 50 mg BID             -DOAC when cleared by NS   14: OSA; does not use CPAP             -sleep medicine follow-up as outpatient   15: CKD stage 3a, prior baseline Cr appears 1.0-1.2 ; per cardiology likely unveiling prior worsening CKD now that she is at a normal volume, stabilizing at 1.4-1.5             -follow-up BMP   - 2/8: Admission labs elevated Cr 1.49; touching base with cardiology regarding wean from IV lasix, encourage PO fluids with restriction <2L daily.   - 3/11: Creatinine is stable at 1.4  - 3/12: Creatinine stable 1.5, repeat 3/15   16. Vertigo/Dizziness.  -Stable  - Vestibular eval ordered - still pending - Meclizine 12.5 mg TID PRN  17.  Dysuria/urinary urgency.  Urgency preexisted current diuretic regimen, was untreated.-Improved -Urinalysis pending -Start Myrbetriq 25 mg daily -PVRs twice per to shift x 3 days to ensure no retention  -3/12: Much improved with the above.  Urinalysis showed UTI, started on Keflex for 7 days, culture pending.  Continue antibiotics for an additional 24 hours, monitor PVRs prior to adjusting Myrbetriq further. - 3/13: 1x high ?PVR vs bladder scan 250s; otherwise 0, with much improved urgency and frequency compared even to baseline; monitor on Myrbetriq at current dose -No recurrent retention, continue current medications.  E. coli UTI, sensitivities pending.  18.  Diarrhea - resolved -Continue Colace twice daily, move Senokot-S from nightly to as needed -Last bowel movement 3-12, improved.  LOS: 7 days A FACE TO FACE EVALUATION WAS PERFORMED  Gertie Gowda 12/08/2022, 9:09 AM

## 2022-12-08 NOTE — Progress Notes (Signed)
Physical Therapy Session Note  Patient Details  Name: [name] MRN: [id number] Date of Birth: 31-May-1941  Today's Date: 12/08/2022 PT Individual Time: 1025-1120 PT Individual Time Calculation (min): 55 min   Short Term Goals: Week 1:  PT Short Term Goal 1 (Week 1): Pt will complete bed mobility with CGA. PT Short Term Goal 2 (Week 1): Pt will complete bed to chair transfer consistently with CGA. PT Short Term Goal 3 (Week 1): Pt will ambulate x50' with minA and LRAD. PT Short Term Goal 4 (Week 1): Pt will complete x8 steps with bilateral hand rails and minA.  Skilled Therapeutic Interventions/Progress Updates:    Pt received sitting in w/c and agreeable to therapy session for follow-up from vestibular assessment. Pt reports she performed the gaze stabilization exercises 3x yesterday with no onset of significant dizziness/whooziness. States she is now able to read her newspaper more successfully, which she is excited about.   Had pt perform HEP with her leading the exercises to ensure she can use printout to correctly perform exercises without therapist providing instruction - requires min cuing/education on increasing speed of head rotations during X1 and ensuring holding card up high enough.  Performed 3 sets of each exercise. Added X1 and X2 with vertical head rotations. Educated pt on goal of providing her gaze stabilization exercises that she can perform safely at home until she can follow-up with OPPT. Educated pt on progressing exercises to a busy background. Wrote the updates on her HEP.  Noticed when doing the X1 gaze stabilization - horizontal head rotations with eyes fixed on target in midline - noticed a few times when pt turning head towards R her eyes had difficult moving to L to stay on target requiring corrective eye movements - improved with following repetitions.   When performing X2 gaze stabilization - horizontal head rotations from 2 targets moving eyes then head from each  target - pt noticed to move much slower and tends to turn head with eye movement when moving towards target on L, improves with increased repetition and practice as well as slowly increasing speed and coordination of these movements.   Pt denies dizziness with these exercises.  MD in/out for morning assessment - reinforced education to pt on importance of follow-up OPPT for vestibular treatment- notified SW to communicate this with pt's family.   Sit<>stand w/c<>bari RW with CGA/close supervision. Gait training ~130f to/from ortho gym using bari RW with CGA for safety - pt demos ability to keep head up and look forward with some visual scanning of environment (previously pt reports she had to keep gaze fixed on floor). Therapist educated pt on reason head rotations cause onset of her symptoms (explaining connections between inner ear and brain).   Therapist adjusted position of footrests to improve pt comfort.  At end of session, pt left seated in w/c with needs in reach.     Therapy Documentation Precautions:  Precautions Precautions: Fall Restrictions Weight Bearing Restrictions: No   Pain:  Reports pain on bottom of her feet from the foot rests - adjusted to improve pt comfort.   Therapy/Group: Individual Therapy  CTawana Scale, PT, DPT, NCS, CSRS 12/08/2022, 7:58 AM

## 2022-12-08 NOTE — Progress Notes (Signed)
Speech Language Pathology TBI Note  Patient Details  Name: [name] MRN: [id number] Date of Birth: Feb 04, 1941  Today's Date: 12/08/2022 SLP Individual Time: [id number] SLP Individual Time Calculation (min): 58 min  Short Term Goals: Week 1: SLP Short Term Goal 1 (Week 1): Patient will implement word finding strategies to overcome communication breakdowns with min A verbal cues for effectiveness SLP Short Term Goal 2 (Week 1): Patient will complete financial management tasks (e.g., check writing) with min-to-mod A verbal/visual cues SLP Short Term Goal 3 (Week 1): Patient will complete medicaiton management tasks (e.g., pillbox organization) with min-to-mod A verbal/visual cues SLP Short Term Goal 4 (Week 1): Patient will complete calendar organization tasks with min-to-mod A verbal/visual cues SLP Short Term Goal 5 (Week 1): Patient will utilize beneficial memory compensations to recall novel information and/or to assist with recall of to-do lists, appointments, etc with min-to-mod A verbal/visual cues  Skilled Therapeutic Interventions: Skilled treatment session focused on cognitive goals. Upon arrival, patient was upright in the wheelchair and consuming her medications whole with thin liquids with reports of a globus sensation. SLP provided education regarding utilization of a heavier bolus to help clear pills through esophagus. Patient followed pills with a tsp of puree with reports of a reduced globus sensation. SLP also facilitated session by providing overall Min A verbal cues for functional problem solving and error awareness during a complex medication management task in which patient had to organize a BID pill box. Patient performed task in a moderately distracting environment with Mod I for attention. Patient left upright in wheelchair with alarm on and all needs within reach. Continue with current plan of care.      Pain Pain Assessment Pain Scale: 0-10 Pain Score: 6  Pain Type:  Acute pain Pain Location: Head Pain Descriptors / Indicators: Headache Pain Intervention(s): Medication (See eMAR)  Agitated Behavior Scale: TBI  ABS discontinued d/t ABS score less than 20 for the last three days or no behaviors present   Therapy/Group: Individual Therapy  [name] 12/08/2022, 1:16 PM

## 2022-12-08 NOTE — Progress Notes (Signed)
Occupational Therapy Weekly Progress Note  Patient Details  Name: [name] MRN: [id number] Date of Birth: [birth year]  Beginning of progress report period: March 8th 2024 End of progress report period: December 08, 2022  Today's Date: 12/08/2022 OT Individual Time: 0700-0810 OT Individual Time Calculation (min): 70 min    Patient has met 4 of 4 short term goals.  Pt has made excellent progress this reporting period impriving to overall CGA level. She has been able to carry over AE education for LB strategies and confusion has continued to improve. She is currently a rancho 7 and improving. Will continue to recommend 24/7 S at DC  Patient continues to demonstrate the following deficits: muscle weakness, decreased cardiorespiratoy endurance, decreased coordination, decreased visual perceptual skills, decreased attention to left, decreased attention, decreased awareness, decreased problem solving, decreased safety awareness, decreased memory, delayed processing, and demonstrates behaviors consistent with Rancho Level 7, and decreased sitting balance, decreased standing balance, decreased postural control, and decreased balance strategies and therefore will continue to benefit from skilled OT intervention to enhance overall performance with BADL, iADL, and Reduce care partner burden.  Patient progressing toward long term goals..  Continue plan of care.  OT Short Term Goals Week 1:  OT Short Term Goal 1 (Week 1): Pt will stand to groom at sink to demo improved activity tolerance OT Short Term Goal 1 - Progress (Week 1): Met OT Short Term Goal 2 (Week 1): Pt will complete SPT wiht LRAD and MIN A OT Short Term Goal 2 - Progress (Week 1): Met OT Short Term Goal 3 (Week 1): Pt will complete 2/3 steps of toileting with MIN A for balance OT Short Term Goal 3 - Progress (Week 1): Met OT Short Term Goal 4 (Week 1): Pt will be A&O x 4 with compensatory strategies PRN OT Short Term Goal 4 - Progress (Week  1): Met Week 2:  OT Short Term Goal 1 (Week 2): STG=LTG d/t ELOS  Skilled Therapeutic Interventions/Progress Updates:     Pt received in bed with no pain  ADL: Pt completes ADL at overall supervision-CGA Level. Skilled interventions include: question and situational cuing for anticipating needs for DAL/gathering items. Pt receptive to education about improving cognition/anticipatory awareness within self care/home management tasks. Pt needs increased time to process through needed items, but able to carry over strategies from previous sessions for footwear. Pt able ot problem solve picking up items off the floor, but needs cuing for aligning closer to object for safe reach. Discussed tomorrows task of cooking and decided to bake brownies!  Pt left at end of session in w/c with NT aware of positioning and door open, call light in reach and all needs met   Therapy Documentation Precautions:  Precautions Precautions: Fall Restrictions Weight Bearing Restrictions: No General:   ABS ABS discontinued d/t ABS score less than 20 for the last three days or no behaviors present      Therapy/Group: Individual Therapy  Tonny Branch 12/08/2022, 6:45 AM

## 2022-12-09 DIAGNOSIS — S069X1S Unspecified intracranial injury with loss of consciousness of 30 minutes or less, sequela: Secondary | ICD-10-CM | POA: Diagnosis not present

## 2022-12-09 DIAGNOSIS — F411 Generalized anxiety disorder: Secondary | ICD-10-CM | POA: Diagnosis not present

## 2022-12-09 DIAGNOSIS — I609 Nontraumatic subarachnoid hemorrhage, unspecified: Secondary | ICD-10-CM | POA: Diagnosis not present

## 2022-12-09 LAB — BASIC METABOLIC PANEL
Anion gap: 11 (ref 5–15)
BUN: 29 mg/dL — ABNORMAL HIGH (ref 8–23)
CO2: 28 mmol/L (ref 22–32)
Calcium: 9.2 mg/dL (ref 8.9–10.3)
Chloride: 99 mmol/L (ref 98–111)
Creatinine, Ser: 1.54 mg/dL — ABNORMAL HIGH (ref 0.44–1.00)
GFR, Estimated: 34 mL/min — ABNORMAL LOW (ref >60)
Glucose, Bld: 98 mg/dL (ref 70–99)
Potassium: 4.5 mmol/L (ref 3.5–5.1)
Sodium: 138 mmol/L (ref 135–145)

## 2022-12-09 LAB — URINE CULTURE: Culture: >=100000 — AB

## 2022-12-09 LAB — MAGNESIUM: Magnesium: 1.8 mg/dL (ref 1.7–2.4)

## 2022-12-09 NOTE — Progress Notes (Addendum)
Patient ID: [name], female   DOB: 1941/01/31, 82 y.o.   MRN: [id number]  SW followed up with [name] to discuss discharge plan. She had various questions related to continued therapy for her mother and how these appointments are scheduled. SW explained a recommendation based on the appropriate level of care from therapy will be provided to PCP who will need to put therapies in place due to [place] another [place]. SW shared once assessed by therapy team, the frequency in respect to what insurance allows will determine the number of visits.  She asked if her mother could can be extended for the following reasons- 1) she states pt is not staying for the recommended time in which it was 14-16 days and will have only received 12 days; 2) She feels this extension will allow her to be at he appropriate level she needs to be to go home, and she is still very weak and;  3) they continue to look for a homecare agency, and would like to know how long an aide will be recommended. SW informed concerns will be relayed to medical team and will follow-up once there is an update.   Medical team did not think an extension would be appropriate due to her current functional level in which pt is already at supervision level of care. Recommends 24/7 care initially at discharge.   1310- Spoke with [name] to inform on above gains made. SW informed that pt is also being recommended for outpatient therapy as well.  She wanted to know specifically the length of time 24/7 care is recommended. This SW indicated 2-3 weeks, and when her mother begins her therapy, these therapists can gauge as well if this continues to remain appropriate. Questioned what if pt does not have the care she needs by the time she leaves. SW informed she has the right to appeal on the date of discharge if she continues to not agree with the d/c recommendation, and explained appeal process. She requested to meet in person with attending on Monday  or a phone  call was suffice. SW relayed above concerns to physician.   [name], MSW, [place] Office: [phone] Cell: [phone] Fax: [phone]

## 2022-12-09 NOTE — Consult Note (Signed)
Neuropsychological Consultation Comprehensive Inpatient Rehab   Patient:   [name]   DOB:   [birth year]  MR Number:  [id number]  Location:  [place] [address] [place] [id number] [place] [address] Dept: [place]: [phone]           Date of Service:   12/09/2022  Start Time:   10 AM End Time:   11 AM  Provider/Observer:  Ilean Skill, Psy.D.       Clinical Neuropsychologist       Billing Code/Service: [phone]  Reason for Service:    [name] is a 82 year old female referred for neuropsychological consultation due to anxiety and coping issues following a traumatic brain injury and currently receiving rehabilitative services on the comprehensive inpatient rehabilitation unit.  The patient presented to the emergency department at [facility] with reports of recent fall approximately 2 days prior to presentation.  CT head identified acute bleed and the patient was transferred to [facility].  During transport she experienced increasing agitation and observed focal seizure like activity.  Repeat CT head scan showed normal evolution of cerebral blood collection of the subarachnoid and subdural spaces.  Subdural hematoma was noted in the occipital lobe region and a small volume of blood noted over the parietal lobe region.  Seizure activity did not continue.  During the clinical interview today the patient described what she could about her fall but had no memory for the fall itself with both retrograde and anterograde amnesia.  The patient described anterograde amnesia for some time after fall and also describes a return of amnestic symptoms during transport to [facility] from [facility].  I suspect that some of the amnestic symptoms and memory changes are related to concussive impacts resulting from the fall itself and the patient's description of vision changes may be related  to subdural and arachnoid hemorrhage impacts.  Patient reports that her mood is stable.  Acknowledges a past history of anxiety but denies that significant anxiety has presented recently to the point that would keep her from being able to actively participate in therapeutic interventions.  Patient was oriented x 4 but acknowledged some difficulties with visual processing aspects.  Expressive and receptive language appeared to be intact.  HPI for the current admission:    HPI: [name] is an 82 year old female who presented to the emergency department at [facility] on with reports of recent fall approximately 2 days prior to presentation.  CT head significant for head bleed and transferred to [facility].  During transport, she was noted to experience increasing agitation and focal seizure activity.  She was given IV Versed and IV Keppra. She is maintained on Eliquis for permanent atrial fibrillation. This was reversed with Andexxa.  Repeat CT head scan showed normal evolution of cerebral blood collections of the subarachnoid and subdural spaces. No further seizure activity. Cardiology consulted for history of a. fib, heart failure and lower extremity edema. Eliquis on hold until cleared to resume by NS. AKI noted. Suspect that lower extremity edema is primarily driven by combination of lymphedema and chronic venous insufficiency. Discussed follow-up of OSA. Diuretics ongoing. No plan in notes for transitioning to oral Lasix. Tolerating diet. NS sees no need for repeat CT head. The patient requires inpatient medicine and rehabilitation evaluations and services for ongoing dysfunction secondary to ubdural hematoma/subarachnoid hemorrhage.    On exam, she is c/o dizziness when turning  her head too quickly in either durectiuon. She also endorses longstanding presyncope on STS requiring her to stay still for 1+ minutes before moving. She lives alone in a 1 story home with 3 steps to b/b; no  family available to assist her. Family aware she is unlikely to be independent level at discharge and is working to get her supervision either with home aides or a  facility.   Medical History:   Past Medical History:  Diagnosis Date   Anemia    Anxiety    Arthritis    Back pain    reason unknown   Cataract    bilateral immature   Diarrhea    Diverticulosis    GERD (gastroesophageal reflux disease)    takes Omeprazole daily   Headache(784.0)    occasionally   Hiatal hernia    History of blood transfusion    no abnormal reaction noted   History of bronchitis 2014   History of colon polyps    History of shingles    Hypertension    takes Metoprolol,Amlodipine and Losartan daily   Internal hemorrhoids    Joint pain    Joint swelling    Lung nodule    Nocturia    Numbness    hands occasionally   Osteoarthritis    Peripheral edema    takes HCTZ daily and Furosemide daily as needed   PONV (postoperative nausea and vomiting)    Redness    to both lower extremities   Shortness of breath    WITH EXERTION    Sleep apnea    doesn't use cpap   Urinary frequency    Urinary urgency    Venous insufficiency          Patient Active Problem List   Diagnosis Date Noted   Anxiety state 12/09/2022   Traumatic brain injury ([place]) 12/03/2022   ICH (intracerebral hemorrhage) ([place]) 11/28/2022   Chronic renal disease, stage 3, moderately decreased glomerular filtration rate between 30-59 mL/min/1.73 square meter ([place]) 05/17/2022   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   History of COVID-19 07/22/2021   Chronic diastolic heart failure ([place]) 12/03/2018   AF (paroxysmal atrial fibrillation) ([place]) 12/03/2018   Hypomagnesemia 12/03/2018   Bilateral hand numbness 05/19/2017   Varicose veins of right lower extremity with complications [id number]   Edema 10/31/2016   Prophylactic antibiotic for dental procedure indicated due to prior joint replacement 09/29/2016   GERD (gastroesophageal  reflux disease) 09/29/2016   Ventral hernia 09/29/2016   SOB (shortness of breath) 05/10/2016   Abdominal wall hernia 05/10/2016   Venous stasis dermatitis of both lower extremities 05/10/2016   Abnormal CT of the chest 12/22/2015   Multiple pulmonary nodules 12/22/2015   Morbid obesity ([place]) 11/14/2013   Presence of unspecified artificial knee joint 07/30/2013   Rosacea 04/03/2012   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 her dress was Appropriate and she was Well Groomed and her manners were Appropriate to the situation.  her participation was indicative of Appropriate and Redirectable behaviors.  There were physical disabilities noted.  she displayed an appropriate level of cooperation and motivation.    Interactions:    Active Redirectable  Attention:   abnormal and  attention span appeared shorter than expected for age  Memory:   abnormal; remote memory intact, recent memory impaired  Visuo-spatial:   abnormal  Speech (Volume):  low  Speech:   normal; normal  Thought Process:  Coherent and Relevant  Distracted, Organized, and Oriented  Though Content:  WNL; not suicidal and not homicidal  Orientation:   person, place, time/date, and situation  Judgment:   Fair  Planning:   Fair  Affect:    Appropriate  Mood:    Euthymic  Insight:   Good  Intelligence:   normal  Psychiatric History:  Patient does have a history of an anxiety diagnosis years ago reports that she has had issues with anxiety but they have been well-managed.  I found no record of past psychotropic medications.  The patient is currently taking trazodone at night for sleep aid and no other  psychotropic medications.  Family Med/Psych History:  Family History  Problem Relation Age of Onset   Lymphoma Father    Leukemia Father    Heart attack Father    Hypertension Mother    Emphysema Mother    Heart disease Sister    Rashes / Skin problems Sister    Heart failure Sister    Kidney disease Sister    Heart disease Maternal Grandfather    Heart disease Paternal Grandmother    Heart disease Paternal Grandfather    Impression/DX:   Notasha Alers is a 82 year old female referred for neuropsychological consultation due to anxiety and coping issues following a traumatic brain injury and currently receiving rehabilitative services on the comprehensive inpatient rehabilitation unit.  The patient presented to the emergency department at [facility] with reports of recent fall approximately 2 days prior to presentation.  CT head identified acute bleed and the patient was transferred to [facility].  During transport she experienced increasing agitation and observed focal seizure like activity.  Repeat CT head scan showed normal evolution of cerebral blood collection of the subarachnoid and subdural spaces.  Subdural hematoma was noted in the occipital lobe region and a small volume of blood noted over the parietal lobe region.  Seizure activity did not continue.  During the clinical interview today the patient described what she could about her fall but had no memory for the fall itself with both retrograde and anterograde amnesia.  The patient described anterograde amnesia for some time after fall and also describes a return of amnestic symptoms during transport to [facility] from [facility].  I suspect that some of the amnestic symptoms and memory changes are related to concussive impacts resulting from the fall itself and the patient's description of vision changes may be related to subdural and arachnoid hemorrhage impacts.  Patient reports that her mood is stable.   Acknowledges a past history of anxiety but denies that significant anxiety has presented recently to the point that would keep her from being able to actively participate in therapeutic interventions.  Patient was oriented x 4 but acknowledged some difficulties with visual processing aspects.  Expressive and receptive language appeared to be intact.  Disposition/Plan:  Today we worked on coping and adjustment issues particularly around her concerns and attempts to recall the details of the events of the fall and subsequent events immediately after the fall.  Patient was not able to give particularly coherent or detailed description of the events of the fall and the events between fall and presentation to the emergency department in [facility].  Patient reports  that she is still having difficulty remembering as effectively as she had prior reports that with every day she is remembering more clearly the events of the day as well as the events of the preceding day.  Patient remains motivated and is actively participating in therapeutic efforts.  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         Electronically Signed   _______________________ Ilean Skill, Psy.D. Clinical Neuropsychologist

## 2022-12-09 NOTE — Progress Notes (Signed)
Occupational Therapy Session Note  Patient Details  Name: [name] MRN: [id number] Date of Birth: 1941-01-11  Today's Date: 12/09/2022 OT Individual Time: [id number] OT Individual Time Calculation (min): 75 min    Short Term Goals: Week 1:  OT Short Term Goal 1 (Week 1): Pt will stand to groom at sink to demo improved activity tolerance OT Short Term Goal 1 - Progress (Week 1): Met OT Short Term Goal 2 (Week 1): Pt will complete SPT wiht LRAD and MIN A OT Short Term Goal 2 - Progress (Week 1): Met OT Short Term Goal 3 (Week 1): Pt will complete 2/3 steps of toileting with MIN A for balance OT Short Term Goal 3 - Progress (Week 1): Met OT Short Term Goal 4 (Week 1): Pt will be A&O x 4 with compensatory strategies PRN OT Short Term Goal 4 - Progress (Week 1): Met  Skilled Therapeutic Interventions/Progress Updates:     Pt received in bed with no pain  ADL: Pt completes ADL  at sit to stand level with supervision for dressing and toileting. Pt incontinent ob BM in brief suddenly needing increased time to clean. AE used without cuing. Pt does well with organization of ALD items  Therapeutic activity Cooking task completed for baking brownies, overall R inattention noted when looking for items spread across countertop. Pt able to organize self/items with MOD quesiton cuing, min cuing for sequencing and notiving of 1 error/missing ingredient. Pt moves through kitchen with supervision for cuing for RW placemnet in regards to taking things in.out of oven. Pt up on feet > 45 min without seated rest.   Pt left at end of session in bed with exit alarm on, call light in reach and all needs met   Therapy Documentation Precautions:  Precautions Precautions: Fall Restrictions Weight Bearing Restrictions: No General:    Therapy/Group: Individual Therapy  Tonny Branch 12/09/2022, 6:34 AM

## 2022-12-09 NOTE — Progress Notes (Signed)
Patient ID: Edia Cirigliano, female   DOB: March 26, 1941, 82 y.o.   MRN: [id number] BP (!) 108/54 (BP Location: Left Arm)   Pulse 80   Temp 98.4 F (36.9 C) (Oral)   Resp 18   Ht 5\' 8"  (1.727 m)   Wt 113.4 kg   LMP 02/07/2012   SpO2 98%   BMI 38.01 kg/m  It was my judgement that given the frequent falls experienced by Ms. Thilges that eliquis is a greater risk than benefit. Cardiology should be consulted, but the risk of eliquis has been eloquently demonstrated. The risk of the arrhythmia causing a stroke remains theoretical.

## 2022-12-09 NOTE — Progress Notes (Signed)
Physical Therapy Weekly Progress Note  Patient Details  Name: [name] MRN: [id number] Date of Birth: [birth year]  Beginning of progress report period: December 02, 2022 End of progress report period: December 09, 2022  Today's Date: 12/09/2022 PT Individual Time: [id number] PT Individual Time Calculation (min): 55 min   Patient has met 3 of 4 short term goals.  Pt is progressing well toward mobility goals, improving independence with balance, transfers, and ambulation. Pt is mobilizing at this point as supervision level and is very close to goal level for discharge. Pt will benefit from family education prior to discharge.  Patient continues to demonstrate the following deficits muscle weakness, decreased cardiorespiratoy endurance, decreased problem solving, decreased safety awareness, decreased memory, and demonstrates behaviors consistent with Rancho Level VIII, and decreased standing balance, decreased postural control, and decreased balance strategies and therefore will continue to benefit from skilled PT intervention to increase functional independence with mobility.  Patient progressing toward long term goals..  Continue plan of care.  PT Short Term Goals Week 1:  PT Short Term Goal 1 (Week 1): Pt will complete bed mobility with CGA. PT Short Term Goal 1 - Progress (Week 1): Met PT Short Term Goal 2 (Week 1): Pt will complete bed to chair transfer consistently with CGA. PT Short Term Goal 2 - Progress (Week 1): Met PT Short Term Goal 3 (Week 1): Pt will ambulate x50' with minA and LRAD. PT Short Term Goal 3 - Progress (Week 1): Met PT Short Term Goal 4 (Week 1): Pt will complete x8 steps with bilateral hand rails and minA. PT Short Term Goal 4 - Progress (Week 1): Progressing toward goal Week 2:  PT Short Term Goal 1 (Week 2): STGs = LTGs  Skilled Therapeutic Interventions/Progress Updates:  Ambulation/gait training;Community reintegration;DME/adaptive equipment  instruction;Neuromuscular re-education;Psychosocial support;Stair training;UE/LE Strength taining/ROM;Balance/vestibular training;Discharge planning;Functional electrical stimulation;Pain management;Skin care/wound management;Therapeutic Activities;UE/LE Coordination activities;Cognitive remediation/compensation;Disease management/prevention;Functional mobility training;Patient/family education;Splinting/orthotics;Therapeutic Exercise;Visual/perceptual remediation/compensation;Wheelchair propulsion/positioning   Pt received seated in WC and agrees to therapy. No complaint of pain. WC transport to gym for time management. Pt ambulates x175' with bariatric RW and cues for upright gaze to improve posture and balance, and safe AD management when approaching WC and transitioning to sitting. PT swaps bariatric RW for standard RW for improved fit and efficiency. PT explains 6 minute walk test to patient and patient verbalizes understanding. Pt then completes with standard RW and ambulates x585' with x2-3 brief standing rest breaks. Pt then completes Nustep for endurance training. Pt completes x12:00 at workload of 5 with average steps per minute ~35. PT provides cues for hand and foot placement and completing full available ROM. Pt takes extended seated rest break, then ambulates x250' with RW and same cues provided. Pt left seated in WC with all needs within reach.  Therapy Documentation Precautions:  Precautions Precautions: Fall Restrictions Weight Bearing Restrictions: No   Therapy/Group: Individual Therapy  Breck Coons 12/09/2022, 1:38 PM

## 2022-12-09 NOTE — Progress Notes (Signed)
PROGRESS NOTE   Subjective/Complaints:  No events overnight.   Patient with no acute complaints. Anxious regarding discharge. Spoke with Dr. Sima Matas today, which was helpful.    ROS: + Vertigo -ongoing , urinary urgency-improving, dysuria-resolved, and diarrhea-resolved..  Patient denies fever, rash, sore throat, blurred vision, dizziness, nausea, vomiting,  cough, shortness of breath or chest pain,  headache, or mood change.   Objective:   No results found. No results for input(s): "WBC", "HGB", "HCT", "PLT" in the last 72 hours.  Recent Labs    12/09/22 0605  NA 138  K 4.5  CL 99  CO2 28  GLUCOSE 98  BUN 29*  CREATININE 1.54*  CALCIUM 9.2     Intake/Output Summary (Last 24 hours) at 12/09/2022 1318 Last data filed at 12/09/2022 1200 Gross per 24 hour  Intake 1193 ml  Output --  Net 1193 ml         Physical Exam: Vital Signs Blood pressure (!) 108/54, pulse 80, temperature 98.4 F (36.9 C), temperature source Oral, resp. rate 18, height 5\' 8"  (1.727 m), weight 113.4 kg, last menstrual period 02/07/2012, SpO2 98 %. Constitutional: No distress . Vital signs reviewed. HEENT: NCAT, EOMI, oral membranes moist Neck: supple Cardiovascular: RRR with sys murmur. No JVD    Respiratory/Chest: CTA Bilaterally without wheezes or rales. Normal effort    GI/Abdomen: BS +, non-tender, non-distended Ext: no clubbing, cyanosis  Psych: pleasant and cooperative  Skin: Multiple bruises on bilateral cheeks, arm, and knees. - resolving Chronic venous stasis changed BL LE L>R. Raised polyps on L shin and medial ankle.  - unchanged MSK:      No apparent deformity.      Strength: All 4 limbs antigravity and against resistance  Neurologic exam:  AAOx3. +Mild processing delay - improved Sensory exam: RLE sensory deficit in distal plantar foot to mid-shin. - ongoing Coordination: + Ataxia on FTN in bilateral UE, L>R -  improved    Assessment/Plan: 1. Functional deficits which require 3+ hours per day of interdisciplinary therapy in a comprehensive inpatient rehab setting. Physiatrist is providing close team supervision and 24 hour management of active medical problems listed below. Physiatrist and rehab team continue to assess barriers to discharge/monitor patient progress toward functional and medical goals  Care Tool:  Bathing    Body parts bathed by patient: Right arm, Left arm, Chest, Abdomen, Right upper leg, Left upper leg, Face   Body parts bathed by helper: Front perineal area, Buttocks, Right lower leg, Left lower leg     Bathing assist Assist Level: 2 Helpers     Upper Body Dressing/Undressing Upper body dressing   What is the patient wearing?: Pull over shirt, Bra    Upper body assist Assist Level: Minimal Assistance - Patient > 75%    Lower Body Dressing/Undressing Lower body dressing      What is the patient wearing?: Underwear/pull up, Pants     Lower body assist Assist for lower body dressing: 2 Helpers     Toileting Toileting    Toileting assist Assist for toileting: 2 Helpers     Transfers Chair/bed transfer  Transfers assist  Chair/bed transfer activity did not occur:  Safety/medical concerns  Chair/bed transfer assist level: 2 Helpers     Locomotion Ambulation   Ambulation assist      Assist level: Contact Guard/Touching assist Assistive device: Walker-rolling Max distance: 51ft   Walk 10 feet activity   Assist     Assist level: Contact Guard/Touching assist Assistive device: Walker-rolling   Walk 50 feet activity   Assist Walk 50 feet with 2 turns activity did not occur: Safety/medical concerns         Walk 150 feet activity   Assist Walk 150 feet activity did not occur: Safety/medical concerns         Walk 10 feet on uneven surface  activity   Assist Walk 10 feet on uneven surfaces activity did not occur:  Safety/medical concerns         Wheelchair     Assist Is the patient using a wheelchair?: Yes Type of Wheelchair: Manual    Wheelchair assist level: Dependent - Patient 0% Max wheelchair distance: 150'    Wheelchair 50 feet with 2 turns activity    Assist        Assist Level: Dependent - Patient 0%   Wheelchair 150 feet activity     Assist      Assist Level: Dependent - Patient 0%   Blood pressure (!) 108/54, pulse 80, temperature 98.4 F (36.9 C), temperature source Oral, resp. rate 18, height 5\' 8"  (1.727 m), weight 113.4 kg, last menstrual period 02/07/2012, SpO2 98 %. Medical Problem List and Plan: 1. 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 Per PT, walked 250 ft CGA with a walker. Mid-Mod A for Cog, not expecting Mod I at home for cog. Will need aide at home but progressing very well functionally. `   - 3/15: Family requesting IPR extension. Patient is stable at [facility] level for the past few days with PT and OT. Do not feel additional time would improve prognosis or allow for improved goals at this time. D/w patient barriers to discharge home, primary issues patient anxiety and family coordination. Discussed risks vs. Benefits of prolonged hospital stay with patient, to which she was understanding.   2.  Antithrombotics: -DVT/anticoagulation:  Mechanical:  Antiembolism stockings, knee (TED hose) Bilateral lower extremities>>patient states she refuses compression stockings; can ACE wrap when OOB  - Was on Eliquis 5 mg BID PTA, Will need to clear resumption with Dr. Christella Noa for discharge vs. OP with repeat imaging - reaching out today 3/15             - antiplatelet therapy: none   3. Pain  Management: Tylenol as needed   4. Mood/Behavior/Sleep: LCSW to evaluate and provide emotional support             -antipsychotic agents: n/a   -will add low dose melatonin tonight for sleep 3/10   -has trazodone prn which hasn't been used  5. Neuropsych/cognition: This patient is capable of making decisions on her own behalf.   6. Skin/Wound Care: Routine skin care checks   - 3/10: ?cellulitis LLE -improved. No signs of active infection;  self resolved without treatment    7. Fluids/Electrolytes/Nutrition: Strict Is and Os and follow-up chemistries             -fluid restriction <2 liters             -continue KCl             -continue B12   - admission labs mag, k stable   - Decreasing PO mag from 800 mg BID to Daily 3/9; repeat on Monday- -added onto a.m. 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:              -fluid restriction <2 liters             -daily weight (dry weight 270 lbs per OP meds)             -continue Jardiance 10 mg daily             -continue spironolactone 25 mg daily             -continue irbesartan 150 mg daily             -continue magnesium oxide 400 mg BID -> daily 3/9             -continue KCl 10 mEq daily Filed Weights   12/07/22 0430 12/08/22 0532 12/09/22 0456  Weight: 116.5 kg 115.1 kg 113.4 kg   -3/10 weights appear to be down-trending, now on lasix 40mg  bid - 3/11: Weight stable, on torsemide 40 mg daily, continue following cardiology recommendations which are much appreciated - 3/12-14: Weight down significantly, BMP stable, monitor for s/s  orthostasis  - 3/15: BUN increasing, Cr stable; weights downtrending, monitor closely 1-2 days and consider decrease Torsemide if indicated   12: Chronic BLE edema/lymphedema:              -Unable to tolerate TED hose; may do well with ACE wrapping, d/w therapies for AM   -elevate legs while in bed  13: Permanent atrial fibrillation:             -continue Lopressor 50 mg BID             -DOAC when cleared by NS   14: OSA; does not use CPAP             -sleep medicine follow-up as outpatient   15: CKD stage 3a, prior baseline Cr appears 1.0-1.2 ; per cardiology likely unveiling prior worsening CKD now that she is at a normal volume, stabilizing at 1.4-1.5             -follow-up BMP   - 2/8: Admission labs elevated Cr 1.49; touching base with cardiology regarding wean from IV lasix, encourage PO fluids with restriction <2L daily.   - 3/11: Creatinine is stable at 1.4  - 3/12: Creatinine stable 1.5, repeat 3/15 - 1.54, stable, BUN uptrending   16. Vertigo/Dizziness.  -Stable  -  Vestibular eval ordered - still pending - Meclizine 12.5 mg TID PRN  17.  Dysuria/urinary urgency.  Urgency preexisted current diuretic regimen, was untreated.-Improved -Urinalysis pending -Start Myrbetriq 25 mg daily -PVRs twice per to shift x 3 days to ensure no retention  -3/12: Much improved with the above.  Urinalysis showed UTI, started on Keflex for 7 days, culture pending.  Continue antibiotics for an additional 24 hours, monitor PVRs prior to adjusting Myrbetriq further. - 3/13: 1x high ?PVR vs bladder scan 250s; otherwise 0, with much improved urgency and frequency compared even to baseline; monitor on Myrbetriq at current dose -No recurrent retention, continue current medications.  E. coli UTI, sensitivities pending.  18.  Diarrhea - resolved -Continue Colace twice daily, move Senokot-S from nightly to as needed -Last bowel movement 3-12, improved.  LOS: 8 days A FACE TO FACE EVALUATION WAS  PERFORMED  Gertie Gowda 12/09/2022, 1:18 PM

## 2022-12-09 NOTE — Progress Notes (Signed)
Speech Language Pathology Weekly Progress and Session Note  Patient Details  Name: [name] MRN: [id number] Date of Birth: 1941/02/11  Beginning of progress report period: December 02, 2022 End of progress report period: December 09, 2022  Today's Date: 12/09/2022 SLP Individual Time: [id number] SLP Individual Time Calculation (min): 54 min  Short Term Goals: Week 1: SLP Short Term Goal 1 (Week 1): Patient will implement word finding strategies to overcome communication breakdowns with min A verbal cues for effectiveness SLP Short Term Goal 1 - Progress (Week 1): Met SLP Short Term Goal 2 (Week 1): Patient will complete financial management tasks (e.g., check writing) with min-to-mod A verbal/visual cues SLP Short Term Goal 2 - Progress (Week 1): Met SLP Short Term Goal 3 (Week 1): Patient will complete medicaiton management tasks (e.g., pillbox organization) with min-to-mod A verbal/visual cues SLP Short Term Goal 3 - Progress (Week 1): Met SLP Short Term Goal 4 (Week 1): Patient will complete calendar organization tasks with min-to-mod A verbal/visual cues SLP Short Term Goal 4 - Progress (Week 1): Met SLP Short Term Goal 5 (Week 1): Patient will utilize beneficial memory compensations to recall novel information and/or to assist with recall of to-do lists, appointments, etc with min-to-mod A verbal/visual cues SLP Short Term Goal 5 - Progress (Week 1): Met    New Short Term Goals: Week 2: SLP Short Term Goal 1 (Week 2): STGs=LTGs due to ELOS  Weekly Progress Updates: Patient has made excellent gains and has met 5 of 5 STGs this reporting period. Currently, patient is demonstrating behaviors consistent with a Rancho Level VII and requires overall Min A verbal cues to complete functional and familiar tasks safely in regards to problem solving, recall, attention and awareness. Patient is also overall Mod I for word-finding at the conversation level. Patient and family education ongoing. Patient  would benefit from continued skilled SLP intervention to maximize her cognitive-linguistic functioning and overall functional independence prior to discharge.      Intensity: Minumum of 1-2 x/day, 30 to 90 minutes Frequency: 3 to 5 out of 7 days Duration/Length of Stay: 12/13/22 Treatment/Interventions: Cognitive remediation/compensation;Internal/external aids;Speech/Language facilitation;Functional tasks;Patient/family education;Therapeutic Activities;Cueing hierarchy   Daily Session  Skilled Therapeutic Interventions:  Skilled treatment session focused on cognitive goals. 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 Patient's overall speed and accuracy improved when patient was writing from a "template" vs generating novel information. Patient also input information into an excel spreadsheet with extra time but with Mod I. Patient demonstrated appropriate awareness regarding difficulty of task and the amount of increased time needed for completion.  Patient left upright in wheelchair with alarm on and all needs within reach. Continue with current plan of care.      Pain Pain Assessment Pain Scale: 0-10 Pain Score: 0-No pain  Therapy/Group: Individual Therapy  [name] 12/09/2022, 6:23 AM

## 2022-12-10 DIAGNOSIS — I609 Nontraumatic subarachnoid hemorrhage, unspecified: Secondary | ICD-10-CM | POA: Diagnosis not present

## 2022-12-10 NOTE — Progress Notes (Signed)
Speech Language Pathology TBI Note  Patient Details  Name: [name] MRN: [id number] Date of Birth: Jun 15, 1941  Today's Date: 12/10/2022 SLP Individual Time: 0830-0930 SLP Individual Time Calculation (min): 60 min  Short Term Goals: Week 2: SLP Short Term Goal 1 (Week 2): STGs=LTGs due to ELOS  Skilled Therapeutic Interventions: Skilled treatment session focused on cognitive goals. Upon arrival, patient's daughter present and SLP provided education regarding patient's current cognitive deficits, progress, and ongoing goals of care. Patient requested to brush her teeth and completed the task independently. Patient then reported she needed to use the commode and ambulated to the bathroom. Unfortunately, while ambulating with supervision with the the RW, patient was incontinent of a large, loose bowel movement. Patient performed peri care with SLP following for thoroughness. Patient also donned a new brief with set-up assist. Patient participated in a cognitive task with a desktop computer that focused on navigation, problem solving and error awareness while typing a novel paragraph in Microsoft word. Patient with fewer mistakes and increased ability to locate keys to self-correct while using the desktop computer compared to the laptop. Overall, patient reported although it continues to take more time, she continues to feel pleased with progress regarding use of a computer (uses consistently at home for tasks). Patient left upright in wheelchair with daughter present. Continue with current plan of care.      Pain Pain Assessment Pain Scale: 0-10 Pain Score: 0-No pain  Agitated Behavior Scale: TBI  ABS discontinued d/t ABS score less than 20 for the last three days or no behaviors present   Therapy/Group: Individual Therapy  Kanani Mowbray 12/10/2022, 12:41 PM

## 2022-12-10 NOTE — Progress Notes (Signed)
PROGRESS NOTE   Subjective/Complaints:  Daughter  [name] at bedside, had several questions regarding discharge, which were answered.  Inquiring about supervision level status on discharge, whether this will be a long-term requirement or anticipated short-term need, communicated that she will likely be safe to move on her own somewhat after a few weeks but on initial discharge therapies have recommended full supervision.  Discussed all of her current medications, their indications, and which ones were discontinued and why.  Patient with no acute complaints this a.m., does note that urinary urgency is overall much improved.   ROS: + Vertigo -ongoing , urinary urgency-improving, dysuria-resolved, and diarrhea-resolved..  Patient denies fever, rash, sore throat, blurred vision, dizziness, nausea, vomiting,  cough, shortness of breath or chest pain,  headache, or mood change.   Objective:   No results found. No results for input(s): "WBC", "HGB", "HCT", "PLT" in the last 72 hours.  Recent Labs    12/09/22 0605  NA 138  K 4.5  CL 99  CO2 28  GLUCOSE 98  BUN 29*  CREATININE 1.54*  CALCIUM 9.2     Intake/Output Summary (Last 24 hours) at 12/10/2022 1626 Last data filed at 12/09/2022 1821 Gross per 24 hour  Intake 240 ml  Output --  Net 240 ml         Physical Exam: Vital Signs Blood pressure (!) 101/59, pulse 82, temperature (!) 97.5 F (36.4 C), resp. rate 16, height 5\' 8"  (1.727 m), weight 112.9 kg, last menstrual period 02/07/2012, SpO2 100 %. Constitutional: No distress . Vital signs reviewed. HEENT: NCAT, EOMI, oral membranes moist Neck: supple Cardiovascular: RRR with sys murmur. No JVD    Respiratory/Chest: CTA Bilaterally without wheezes or rales. Normal effort    GI/Abdomen: BS +, non-tender, non-distended Ext: no clubbing, cyanosis  Psych: pleasant and cooperative  Skin: Multiple bruises on bilateral cheeks,  arm, and knees. - resolving Chronic venous stasis changed BL LE L>R. Raised polyps on L shin and medial ankle.  - unchanged MSK:      No apparent deformity.      Strength: All 4 limbs antigravity and against resistance  Neurologic exam:  AAOx3. +Mild processing delay - improved Sensory exam: RLE sensory deficit in distal plantar foot to mid-shin. - ongoing Coordination: + Ataxia on FTN in bilateral UE, L>R - improved    Assessment/Plan: 1. Functional deficits which require 3+ hours per day of interdisciplinary therapy in a comprehensive inpatient rehab setting. Physiatrist is providing close team supervision and 24 hour management of active medical problems listed below. Physiatrist and rehab team continue to assess barriers to discharge/monitor patient progress toward functional and medical goals  Care Tool:  Bathing    Body parts bathed by patient: Right arm, Left arm, Chest, Abdomen, Right upper leg, Left upper leg, Face   Body parts bathed by helper: Front perineal area, Buttocks, Right lower leg, Left lower leg     Bathing assist Assist Level: 2 Helpers     Upper Body Dressing/Undressing Upper body dressing   What is the patient wearing?: Pull over shirt, Bra    Upper body assist Assist Level: Minimal Assistance - Patient > 75%  Lower Body Dressing/Undressing Lower body dressing      What is the patient wearing?: Underwear/pull up, Pants     Lower body assist Assist for lower body dressing: 2 Helpers     Toileting Toileting    Toileting assist Assist for toileting: 2 Helpers     Transfers Chair/bed transfer  Transfers assist  Chair/bed transfer activity did not occur: Safety/medical concerns  Chair/bed transfer assist level: 2 Helpers     Locomotion Ambulation   Ambulation assist      Assist level: Contact Guard/Touching assist Assistive device: Walker-rolling Max distance: 61ft   Walk 10 feet activity   Assist     Assist level:  Contact Guard/Touching assist Assistive device: Walker-rolling   Walk 50 feet activity   Assist Walk 50 feet with 2 turns activity did not occur: Safety/medical concerns         Walk 150 feet activity   Assist Walk 150 feet activity did not occur: Safety/medical concerns         Walk 10 feet on uneven surface  activity   Assist Walk 10 feet on uneven surfaces activity did not occur: Safety/medical concerns         Wheelchair     Assist Is the patient using a wheelchair?: Yes Type of Wheelchair: Manual    Wheelchair assist level: Dependent - Patient 0% Max wheelchair distance: 150'    Wheelchair 50 feet with 2 turns activity    Assist        Assist Level: Dependent - Patient 0%   Wheelchair 150 feet activity     Assist      Assist Level: Dependent - Patient 0%   Blood pressure (!) 101/59, pulse 82, temperature (!) 97.5 F (36.4 C), resp. rate 16, height 5\' 8"  (1.727 m), weight 112.9 kg, last menstrual period 02/07/2012, SpO2 100 %. Medical Problem List and Plan: 1. 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 Per PT, walked 250 ft CGA with a walker. Mid-Mod A for Cog, not expecting Mod I at home for cog. Will need aide at home but progressing very well functionally. `   - 3/15: Family requesting IPR extension. Patient is stable at [facility] level for the past few days with PT and OT. Do not feel additional time would improve prognosis or allow for improved goals at this time. D/w patient barriers to discharge home, primary issues patient anxiety and family coordination. Discussed risks vs. Benefits of prolonged hospital stay with patient, to which she was understanding.   - 3/16: Discussed  discharge plan with daughter [name], answered all questions.  Will call daughter [name] tomorrow.  2.  Antithrombotics: -DVT/anticoagulation:  Mechanical:  Antiembolism stockings, knee (TED hose) Bilateral lower extremities>>patient states she refuses compression stockings; can ACE wrap when OOB  - Was on Eliquis 5 mg BID PTA, Will need to clear resumption with Dr. Christella Noa for discharge vs. OP with repeat imaging - reaching out today 3/15; per Dr. Christella Noa given significant risk of bleed and falls would not recommend resuming anticoagulation at this time.  Can defer to outpatient.             -  antiplatelet therapy: none   3. Pain Management: Tylenol as needed   4. Mood/Behavior/Sleep: LCSW to evaluate and provide emotional support             -antipsychotic agents: n/a   -will add low dose melatonin tonight for sleep 3/10   -has trazodone prn which hasn't been used  5. Neuropsych/cognition: This patient is capable of making decisions on her own behalf.   6. Skin/Wound Care: Routine skin care checks   - 3/10: ?cellulitis LLE -improved. No signs of active infection; self resolved without treatment    7. Fluids/Electrolytes/Nutrition: Strict Is and Os and follow-up chemistries             -fluid restriction <2 liters             -continue KCl             -continue B12   - admission labs mag, k stable   - Decreasing PO mag from 800 mg BID to Daily 3/9; repeat on Monday- -added onto a.m. 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:              -fluid restriction <2 liters             -daily  weight (dry weight 270 lbs per OP meds)             -continue Jardiance 10 mg daily             -continue spironolactone 25 mg daily             -continue irbesartan 150 mg daily             -continue magnesium oxide 400 mg BID -> daily 3/9             -continue KCl 10 mEq daily Filed Weights   12/08/22 0532 12/09/22 0456 12/10/22 0503  Weight: 115.1 kg 113.4 kg 112.9 kg   -3/10 weights appear to be down-trending, now on lasix 40mg  bid - 3/11: Weight stable, on torsemide 40 mg daily, continue following cardiology recommendations which are much appreciated - 3/12-14: Weight down significantly, BMP stable, monitor for s/s orthostasis  - 3/15: BUN increasing, Cr stable; weights downtrending, monitor closely 1-2 days and consider decrease Torsemide if indicated -Weight stabilizing, monitor   12: Chronic BLE edema/lymphedema:              -Unable to tolerate TED hose; may do well with ACE wrapping, d/w therapies for AM   -elevate legs while in bed  13: Permanent atrial fibrillation:             -continue Lopressor 50 mg BID             -DOAC when cleared by NS   14: OSA; does not use CPAP             -sleep medicine follow-up as outpatient  15: CKD stage 3a, prior baseline Cr appears 1.0-1.2 ; per cardiology likely unveiling prior worsening CKD now that she is at a normal volume, stabilizing at 1.4-1.5             -follow-up BMP   - 2/8: Admission labs elevated Cr 1.49; touching base with cardiology regarding wean from IV lasix, encourage PO fluids with restriction <2L daily.   - 3/11: Creatinine is stable at 1.4  - 3/12: Creatinine stable 1.5, repeat 3/15 - 1.54, stable, BUN uptrending   16. Vertigo/Dizziness.  -Stable  - Vestibular eval ordered - still pending - Meclizine 12.5 mg TID PRN  17.  Dysuria/urinary urgency.  Urgency preexisted current diuretic regimen, was untreated.-Improved -Urinalysis pending -Start Myrbetriq 25 mg daily -PVRs twice per to shift x 3 days to ensure  no retention  -3/12: Much improved with the above.  Urinalysis showed UTI, started on Keflex for 7 days, culture pending.  Continue antibiotics for an additional 24 hours, monitor PVRs prior to adjusting Myrbetriq further. - 3/13: 1x high ?PVR vs bladder scan 250s; otherwise 0, with much improved urgency and frequency compared even to baseline; monitor on Myrbetriq at current dose -No recurrent retention, continue current medications.  E. coli UTI, sensitive to Keflex, finished treatment. 18.  Diarrhea - resolved -Continue Colace twice daily, move Senokot-S from nightly to as needed  LOS: 9 days A FACE TO [place] 12/10/2022, 4:26 PM

## 2022-12-10 NOTE — Plan of Care (Signed)
  Problem: Consults Goal: RH STROKE PATIENT EDUCATION Description: See Patient Education module for education specifics  Outcome: Progressing   Problem: RH BOWEL ELIMINATION Goal: RH STG MANAGE BOWEL WITH ASSISTANCE Description: STG Manage Bowel with min Assistance. Outcome: Progressing Goal: RH STG MANAGE BOWEL W/MEDICATION W/ASSISTANCE Description: STG Manage Bowel with Medication with min Assistance. Outcome: Progressing   Problem: RH BLADDER ELIMINATION Goal: RH STG MANAGE BLADDER WITH ASSISTANCE Description: STG Manage Bladder With min Assistance Outcome: Progressing   Problem: RH SKIN INTEGRITY Goal: RH STG SKIN FREE OF INFECTION/BREAKDOWN Description: Skin will remain free of infection/breakdown with min assist Outcome: Progressing Goal: RH STG MAINTAIN SKIN INTEGRITY WITH ASSISTANCE Description: STG Maintain Skin Integrity With min Assistance. Outcome: Progressing   Problem: RH SAFETY Goal: RH STG ADHERE TO SAFETY PRECAUTIONS W/ASSISTANCE/DEVICE Description: STG Adhere to Safety Precautions With cueing Assistance/Device. Outcome: Progressing   Problem: RH COGNITION-NURSING Goal: RH STG USES MEMORY AIDS/STRATEGIES W/ASSIST TO PROBLEM SOLVE Description: STG Uses Memory Aids/Strategies With cueing Assistance to Problem Solve. Outcome: Progressing Goal: RH STG ANTICIPATES NEEDS/CALLS FOR ASSIST W/ASSIST/CUES Description: STG Anticipates Needs/Calls for Assist With Mod I Assistance/Cues. Outcome: Progressing   Problem: RH PAIN MANAGEMENT Goal: RH STG PAIN MANAGED AT OR BELOW PT'S PAIN GOAL Description: Pain will be managed at 3 out of 10 on pain scale with PRN medications min assist Outcome: Progressing   Problem: RH KNOWLEDGE DEFICIT Goal: RH STG INCREASE KNOWLEDGE OF HYPERTENSION Description: Patient/ caregiver will be able to manage medications, daily weights and self care from nursing education and nursing handouts independently  Outcome: Progressing   

## 2022-12-11 DIAGNOSIS — I609 Nontraumatic subarachnoid hemorrhage, unspecified: Secondary | ICD-10-CM | POA: Diagnosis not present

## 2022-12-11 NOTE — Plan of Care (Signed)
  Problem: RH BOWEL ELIMINATION Goal: RH STG MANAGE BOWEL WITH ASSISTANCE Description: STG Manage Bowel with min Assistance. Outcome: Progressing   Problem: RH BOWEL ELIMINATION Goal: RH STG MANAGE BOWEL W/MEDICATION W/ASSISTANCE Description: STG Manage Bowel with Medication with min Assistance. Outcome: Progressing   Problem: RH BLADDER ELIMINATION Goal: RH STG MANAGE BLADDER WITH ASSISTANCE Description: STG Manage Bladder With min Assistance Outcome: Progressing   Problem: RH SKIN INTEGRITY Goal: RH STG SKIN FREE OF INFECTION/BREAKDOWN Description: Skin will remain free of infection/breakdown with min assist Outcome: Progressing   Problem: RH KNOWLEDGE DEFICIT Goal: RH STG INCREASE KNOWLEDGE OF HYPERTENSION Description: Patient/ caregiver will be able to manage medications, daily weights and self care from nursing education and nursing handouts independently  Outcome: Progressing

## 2022-12-11 NOTE — Progress Notes (Signed)
PROGRESS NOTE   Subjective/Complaints:  No events overnight.  Patient seen at bedside, son in room and got daughter [name] on the phone for family discussion.  Reiterated information, yesterday regarding plan for discharge with full supervision, outpatient therapies, and anticipate possible progression to intermittent supervision within the next few weeks.  Son is going to take 2 weeks off of work to provide full-time supervision on discharge.  Answered questions regarding current medications, changes since hospitalization, and follow-ups.  All concerns addressed.   ROS: + Vertigo -ongoing , urinary urgency much improved, dysuria-resolved, and diarrhea-resolved..  Patient denies fever, rash, sore throat, blurred vision, dizziness, nausea, vomiting,  cough, shortness of breath or chest pain,  headache, or mood change.   Objective:   No results found. No results for input(s): "WBC", "HGB", "HCT", "PLT" in the last 72 hours.  Recent Labs    12/09/22 0605  NA 138  K 4.5  CL 99  CO2 28  GLUCOSE 98  BUN 29*  CREATININE 1.54*  CALCIUM 9.2    No intake or output data in the 24 hours ending 12/11/22 1417       Physical Exam: Vital Signs Blood pressure (!) 104/56, pulse 88, temperature 98.5 F (36.9 C), temperature source Oral, resp. rate 16, height 5\' 8"  (1.727 m), weight 110.7 kg, last menstrual period 02/07/2012, SpO2 95 %. Constitutional: No distress . Vital signs reviewed. HEENT: NCAT, EOMI, oral membranes moist Neck: supple Cardiovascular: RRR with sys murmur. No JVD    Respiratory/Chest: CTA Bilaterally without wheezes or rales. Normal effort    GI/Abdomen: BS +, non-tender, non-distended Ext: no clubbing, cyanosis.  Bilateral lower extremity lymphedema, legs appear much smaller compared to admission, with some wrinkling of overlying skin. Psych: pleasant and cooperative  Skin: Multiple bruises on bilateral cheeks, arm,  and knees. - resolving Chronic venous stasis changed BL LE L>R. Raised polyps on L shin and medial ankle.  - unchanged MSK:      No apparent deformity.      Strength: All 4 limbs antigravity and against resistance  Neurologic exam:  AAOx3. +Mild processing delay - improved Sensory exam: RLE sensory deficit in distal plantar foot to mid-shin. - ongoing Coordination: + Ataxia on FTN in bilateral UE, L>R - improved    Assessment/Plan: 1. Functional deficits which require 3+ hours per day of interdisciplinary therapy in a comprehensive inpatient rehab setting. Physiatrist is providing close team supervision and 24 hour management of active medical problems listed below. Physiatrist and rehab team continue to assess barriers to discharge/monitor patient progress toward functional and medical goals  Care Tool:  Bathing    Body parts bathed by patient: Right arm, Left arm, Chest, Abdomen, Right upper leg, Left upper leg, Face   Body parts bathed by helper: Front perineal area, Buttocks, Right lower leg, Left lower leg     Bathing assist Assist Level: 2 Helpers     Upper Body Dressing/Undressing Upper body dressing   What is the patient wearing?: Pull over shirt, Bra    Upper body assist Assist Level: Minimal Assistance - Patient > 75%    Lower Body Dressing/Undressing Lower body dressing  What is the patient wearing?: Underwear/pull up, Pants     Lower body assist Assist for lower body dressing: 2 Helpers     Toileting Toileting    Toileting assist Assist for toileting: 2 Helpers     Transfers Chair/bed transfer  Transfers assist  Chair/bed transfer activity did not occur: Safety/medical concerns  Chair/bed transfer assist level: 2 Helpers     Locomotion Ambulation   Ambulation assist      Assist level: Contact Guard/Touching assist Assistive device: Walker-rolling Max distance: 47ft   Walk 10 feet activity   Assist     Assist level: Contact  Guard/Touching assist Assistive device: Walker-rolling   Walk 50 feet activity   Assist Walk 50 feet with 2 turns activity did not occur: Safety/medical concerns         Walk 150 feet activity   Assist Walk 150 feet activity did not occur: Safety/medical concerns         Walk 10 feet on uneven surface  activity   Assist Walk 10 feet on uneven surfaces activity did not occur: Safety/medical concerns         Wheelchair     Assist Is the patient using a wheelchair?: Yes Type of Wheelchair: Manual    Wheelchair assist level: Dependent - Patient 0% Max wheelchair distance: 150'    Wheelchair 50 feet with 2 turns activity    Assist        Assist Level: Dependent - Patient 0%   Wheelchair 150 feet activity     Assist      Assist Level: Dependent - Patient 0%   Blood pressure (!) 104/56, pulse 88, temperature 98.5 F (36.9 C), temperature source Oral, resp. rate 16, height 5\' 8"  (1.727 m), weight 110.7 kg, last menstrual period 02/07/2012, SpO2 95 %. Medical Problem List and Plan: 1. 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 Per PT, walked 250 ft CGA with a walker. Mid-Mod A for Cog, not expecting Mod I at home for cog. Will need aide at home but progressing very well functionally. `   - 3/15: Family requesting IPR extension. Patient is stable at [facility] level for the past few days with PT and OT. Do not feel additional time would improve prognosis or allow for improved goals at this time. D/w patient barriers to discharge home, primary issues patient anxiety and family coordination. Discussed risks vs. Benefits of prolonged hospital stay with patient, to which she was understanding.   - 3/16:  Discussed discharge plan with daughter Jeani Hawking, answered all questions.  Will call daughter [name] tomorrow.  - 3/17: Repeated discussion with son and daughter [name], all concerns addressed, time taken >30 minutes on family education.  Patient and family requesting outpatient therapies to be set up closer to their home in [place], [place] or [place], [place].  Will coordinate this with social work on Monday.  2.  Antithrombotics: -DVT/anticoagulation:  Mechanical:  Antiembolism stockings, knee (TED hose) Bilateral lower extremities>>patient states she refuses compression stockings; can ACE wrap when OOB  - Was on Eliquis 5 mg BID PTA, Will  need to clear resumption with Dr. Christella Noa for discharge vs. OP with repeat imaging - reaching out today 3/15; per Dr. Christella Noa given significant risk of bleed and falls would not recommend resuming anticoagulation at this time.  Can defer to outpatient.             - antiplatelet therapy: none   3. Pain Management: Tylenol as needed   4. Mood/Behavior/Sleep: LCSW to evaluate and provide emotional support             -antipsychotic agents: n/a   -will add low dose melatonin tonight for sleep 3/10   -has trazodone prn which hasn't been used  5. Neuropsych/cognition: This patient is capable of making decisions on her own behalf.   6. Skin/Wound Care: Routine skin care checks   - 3/10: ?cellulitis LLE -improved. No signs of active infection; self resolved without treatment    7. Fluids/Electrolytes/Nutrition: Strict Is and Os and follow-up chemistries             -fluid restriction <2 liters             -continue KCl             -continue B12   - admission labs mag, k stable   - Decreasing PO mag from 800 mg BID to Daily 3/9; repeat on Monday- -added onto a.m. 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:              -fluid restriction <2 liters             -daily weight (dry weight 270 lbs per OP meds)             -continue Jardiance 10 mg daily             -continue spironolactone 25 mg daily             -continue irbesartan 150 mg daily             -continue magnesium oxide 400 mg BID -> daily 3/9             -continue KCl 10 mEq daily Filed Weights   12/09/22 0456 12/10/22 0503 12/11/22 0429  Weight: 113.4 kg 112.9 kg 110.7 kg   -3/10 weights appear to be down-trending, now on lasix 40mg  bid - 3/11: Weight stable, on torsemide 40 mg daily, continue following cardiology recommendations which are much appreciated - 3/12-14: Weight down significantly, BMP stable, monitor for s/s orthostasis  - 3/15: BUN increasing, Cr stable; weights downtrending, monitor closely 1-2 days and consider decrease Torsemide if indicated -Weight stabilizing, monitor; repeat BMP in a.m. with magnesium prior to discharge   12: Chronic BLE edema/lymphedema:              -Unable to tolerate TED hose; may do well with ACE wrapping, d/w therapies for AM   -elevate legs while in bed  13: Permanent atrial fibrillation:             -continue Lopressor 50 mg BID             -DOAC when cleared by NS   14: OSA; does not use CPAP             -sleep medicine follow-up as outpatient   15: CKD stage 3a, prior baseline Cr appears 1.0-1.2 ; per cardiology likely unveiling prior worsening CKD now that she is at a normal volume, stabilizing at 1.4-1.5             -follow-up BMP   - 2/8: Admission labs elevated Cr 1.49; touching base with cardiology regarding wean from IV lasix, encourage PO fluids with restriction <2L daily.   - 3/11: Creatinine is stable  at 1.4  - 3/12: Creatinine stable 1.5, repeat 3/15 - 1.54, stable,    16. Vertigo/Dizziness.  -Stable  - Vestibular eval ordered - still pending - Meclizine 12.5 mg TID PRN  17.  Dysuria/urinary urgency.  Urgency preexisted current diuretic regimen, was untreated.-Improved -Urinalysis pending -Start Myrbetriq 25 mg daily -PVRs twice per to shift x 3 days to ensure no retention  -3/12: Much improved with the above.  Urinalysis showed UTI, started on Keflex for 7 days, culture pending.  Continue antibiotics for an additional 24 hours, monitor PVRs prior to adjusting Myrbetriq further. - 3/13: 1x high ?PVR vs bladder scan 250s; otherwise 0, with much improved urgency and frequency compared even to baseline; monitor on Myrbetriq at current dose -No recurrent retention, continue current medications.  E. coli UTI, sensitive to Keflex, finished treatment.  18.  Diarrhea - resolved -Continue Colace twice daily, move Senokot-S from nightly to as needed - LBM 3/17    LOS: 10 days A FACE TO [place] 12/11/2022, 2:17 PM

## 2022-12-12 ENCOUNTER — Other Ambulatory Visit (HOSPITAL_COMMUNITY): Payer: Self-pay

## 2022-12-12 DIAGNOSIS — S069X9S Unspecified intracranial injury with loss of consciousness of unspecified duration, sequela: Secondary | ICD-10-CM | POA: Diagnosis not present

## 2022-12-12 DIAGNOSIS — I5023 Acute on chronic systolic (congestive) heart failure: Secondary | ICD-10-CM

## 2022-12-12 DIAGNOSIS — I10 Essential (primary) hypertension: Secondary | ICD-10-CM

## 2022-12-12 DIAGNOSIS — E611 Iron deficiency: Secondary | ICD-10-CM | POA: Diagnosis not present

## 2022-12-12 LAB — BASIC METABOLIC PANEL
Anion gap: 9 (ref 5–15)
BUN: 29 mg/dL — ABNORMAL HIGH (ref 8–23)
CO2: 28 mmol/L (ref 22–32)
Calcium: 9.2 mg/dL (ref 8.9–10.3)
Chloride: 98 mmol/L (ref 98–111)
Creatinine, Ser: 1.55 mg/dL — ABNORMAL HIGH (ref 0.44–1.00)
GFR, Estimated: 33 mL/min — ABNORMAL LOW (ref >60)
Glucose, Bld: 112 mg/dL — ABNORMAL HIGH (ref 70–99)
Potassium: 3.8 mmol/L (ref 3.5–5.1)
Sodium: 135 mmol/L (ref 135–145)

## 2022-12-12 LAB — CBC
HCT: 41.3 % (ref 36.0–46.0)
Hemoglobin: 13.4 g/dL (ref 12.0–15.0)
MCH: 31 pg (ref 26.0–34.0)
MCHC: 32.4 g/dL (ref 30.0–36.0)
MCV: 95.6 fL (ref 80.0–100.0)
Platelets: 218 10*3/uL (ref 150–400)
RBC: 4.32 MIL/uL (ref 3.87–5.11)
RDW: 15 % (ref 11.5–15.5)
WBC: 7.2 10*3/uL (ref 4.0–10.5)
nRBC: 0 % (ref 0.0–0.2)

## 2022-12-12 LAB — MAGNESIUM: Magnesium: 1.8 mg/dL (ref 1.7–2.4)

## 2022-12-12 MED ORDER — ACETAMINOPHEN 325 MG PO TABS: 325.0000 mg | ORAL_TABLET | ORAL | Status: AC | PRN

## 2022-12-12 MED ORDER — TORSEMIDE 20 MG PO TABS
40.0000 mg | ORAL_TABLET | Freq: Every day | ORAL | 0 refills | Status: DC
  Filled 2022-12-12: qty 60, 30d supply, fill #0

## 2022-12-12 MED ORDER — SPIRONOLACTONE 25 MG PO TABS
25.0000 mg | ORAL_TABLET | Freq: Every day | ORAL | 0 refills | Status: DC
  Filled 2022-12-12: qty 30, 30d supply, fill #0

## 2022-12-12 MED ORDER — MELATONIN 3 MG PO TABS
3.0000 mg | ORAL_TABLET | Freq: Every day | ORAL | 0 refills | Status: AC
  Filled 2022-12-12: qty 30, 30d supply, fill #0

## 2022-12-12 MED ORDER — MAGNESIUM OXIDE 400 MG PO TABS
400.0000 mg | ORAL_TABLET | Freq: Every day | ORAL | 0 refills | Status: AC
  Filled 2022-12-12: qty 30, 30d supply, fill #0

## 2022-12-12 MED ORDER — IRBESARTAN 75 MG PO TABS
75.0000 mg | ORAL_TABLET | Freq: Every day | ORAL | Status: DC
  Administered 2022-12-13: 75 mg via ORAL
  Filled 2022-12-12: qty 1

## 2022-12-12 MED ORDER — IRBESARTAN 75 MG PO TABS
75.0000 mg | ORAL_TABLET | Freq: Every day | ORAL | 0 refills | Status: DC
  Filled 2022-12-12: qty 30, 30d supply, fill #0

## 2022-12-12 NOTE — Progress Notes (Signed)
PROGRESS NOTE   Subjective/Complaints:  Pt reports she feels well after having shower with therapy this AM. No new complaints or concerns today.  LBM 3/18 today, she had incontinent episode not able to make it to toilet in time.   ROS: + Vertigo -ongoing , urinary urgency much improved, dysuria-resolved, and diarrhea-resolved..  Patient denies fever, rash, sore throat, blurred vision, dizziness, nausea, vomiting,  Abdominal pain, cough, shortness of breath or chest pain,  headache, or mood change.   Objective:   No results found. Recent Labs    12/12/22 0758  WBC 7.2  HGB 13.4  HCT 41.3  PLT 218    Recent Labs    12/12/22 0758  NA 135  K 3.8  CL 98  CO2 28  GLUCOSE 112*  BUN 29*  CREATININE 1.55*  CALCIUM 9.2     Intake/Output Summary (Last 24 hours) at 12/12/2022 1035 Last data filed at 12/12/2022 0801 Gross per 24 hour  Intake 480 ml  Output --  Net 480 ml         Physical Exam: Vital Signs Blood pressure 110/60, pulse 77, temperature 98.6 F (37 C), temperature source Oral, resp. rate 18, height 5\' 8"  (1.727 m), weight 110.6 kg, last menstrual period 02/07/2012, SpO2 94 %. Constitutional: No distress . Vital signs reviewed. HEENT: NCAT, conjugate gaze, oral membranes moist Neck: supple Cardiovascular: RRR with sys murmur. No JVD    Respiratory/Chest: CTA Bilaterally without wheezes or rales. Normal effort    GI/Abdomen: BS +, non-tender, non-distended Ext: no clubbing, cyanosis.  Bilateral lower extremity lymphedema, legs appear much smaller compared to admission, with some wrinkling of overlying skin. Psych: pleasant and cooperative  Skin: Multiple bruises on bilateral cheeks, arm, and knees. - resolving Chronic venous stasis changed BL LE L>R. Raised polyps on L shin and medial ankle.  - unchanged MSK:      No apparent deformity.      Strength: All 4 limbs antigravity and against  resistance  Neurologic exam:  AAOx3. +Mild processing delay - improved, follows commands Sensory exam: RLE sensory deficit in distal plantar foot to mid-shin. - ongoing Coordination: + Ataxia on FTN in bilateral UE, L>R - improved    Assessment/Plan: 1. Functional deficits which require 3+ hours per day of interdisciplinary therapy in a comprehensive inpatient rehab setting. Physiatrist is providing close team supervision and 24 hour management of active medical problems listed below. Physiatrist and rehab team continue to assess barriers to discharge/monitor patient progress toward functional and medical goals  Care Tool:  Bathing    Body parts bathed by patient: Right arm, Left arm, Chest, Abdomen, Right upper leg, Left upper leg, Face, Right lower leg, Left lower leg, Front perineal area, Buttocks   Body parts bathed by helper: Front perineal area, Buttocks, Right lower leg, Left lower leg     Bathing assist Assist Level: Supervision/Verbal cueing     Upper Body Dressing/Undressing Upper body dressing   What is the patient wearing?: Pull over shirt, Bra    Upper body assist Assist Level: Independent    Lower Body Dressing/Undressing Lower body dressing      What is the patient wearing?: Underwear/pull  up, Pants     Lower body assist Assist for lower body dressing: Supervision/Verbal cueing     Toileting Toileting    Toileting assist Assist for toileting: Supervision/Verbal cueing     Transfers Chair/bed transfer  Transfers assist  Chair/bed transfer activity did not occur: Safety/medical concerns  Chair/bed transfer assist level: 2 Helpers     Locomotion Ambulation   Ambulation assist      Assist level: Contact Guard/Touching assist Assistive device: Walker-rolling Max distance: 44ft   Walk 10 feet activity   Assist     Assist level: Contact Guard/Touching assist Assistive device: Walker-rolling   Walk 50 feet activity   Assist Walk  50 feet with 2 turns activity did not occur: Safety/medical concerns         Walk 150 feet activity   Assist Walk 150 feet activity did not occur: Safety/medical concerns         Walk 10 feet on uneven surface  activity   Assist Walk 10 feet on uneven surfaces activity did not occur: Safety/medical concerns         Wheelchair     Assist Is the patient using a wheelchair?: Yes Type of Wheelchair: Manual    Wheelchair assist level: Dependent - Patient 0% Max wheelchair distance: 150'    Wheelchair 50 feet with 2 turns activity    Assist        Assist Level: Dependent - Patient 0%   Wheelchair 150 feet activity     Assist      Assist Level: Dependent - Patient 0%   Blood pressure 110/60, pulse 77, temperature 98.6 F (37 C), temperature source Oral, resp. rate 18, height 5\' 8"  (1.727 m), weight 110.6 kg, last menstrual period 02/07/2012, SpO2 94 %. Medical Problem List and Plan: 1. 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 Per PT, walked 250 ft CGA with a walker. Mid-Mod A for Cog, not expecting Mod I at home for cog. Will need aide at home but progressing very well functionally. `   - 3/15: Family requesting IPR extension. Patient is stable at [facility] level for the past few days with PT and OT. Do not feel additional time would improve prognosis or allow for improved goals at this time. D/w patient barriers to discharge home, primary issues patient anxiety and family coordination. Discussed risks vs. Benefits of prolonged hospital stay with patient, to which she was understanding.   - 3/16: Discussed discharge plan with daughter Jeani Hawking, answered all questions.  Will call daughter [name]  tomorrow.  - 3/17: Repeated discussion with son and daughter [name], all concerns addressed, time taken >30 minutes on family education.  Patient and family requesting outpatient therapies to be set up closer to their home in [place], [place] or [place], [place].  Will coordinate this with social work on Monday.  2.  Antithrombotics: -DVT/anticoagulation:  Mechanical:  Antiembolism stockings, knee (TED hose) Bilateral lower extremities>>patient states she refuses compression stockings; can ACE wrap when OOB  - Was on Eliquis 5 mg BID PTA, Will need to clear resumption with Dr. Christella Noa  for discharge vs. OP with repeat imaging - reaching out today 3/15; per Dr. Christella Noa given significant risk of bleed and falls would not recommend resuming anticoagulation at this time.  Can defer to outpatient.             - antiplatelet therapy: none   3. Pain Management: Tylenol as needed   4. Mood/Behavior/Sleep: LCSW to evaluate and provide emotional support             -antipsychotic agents: n/a   -will add low dose melatonin tonight for sleep 3/10   -has trazodone prn which hasn't been used  5. Neuropsych/cognition: This patient is capable of making decisions on her own behalf.   6. Skin/Wound Care: Routine skin care checks   - 3/10: ?cellulitis LLE -improved. No signs of active infection; self resolved without treatment    7. Fluids/Electrolytes/Nutrition: Strict Is and Os and follow-up chemistries             -fluid restriction <2 liters             -continue KCl             -continue B12   - admission labs mag, k stable   - Decreasing PO mag from 800 mg BID to Daily 3/9; repeat on Monday- -added onto a.m. 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:              -fluid restriction <2 liters             -daily weight (dry weight 270 lbs per OP meds)             -continue Jardiance 10 mg daily             -continue spironolactone 25 mg daily             -continue irbesartan 150 mg daily             -continue magnesium oxide 400 mg BID -> daily 3/9             -continue KCl 10 mEq daily Filed Weights   12/10/22 0503 12/11/22 0429 12/12/22 0458  Weight: 112.9 kg 110.7 kg 110.6 kg   -3/10 weights appear to be down-trending, now on lasix 40mg  bid - 3/11: Weight stable, on torsemide 40 mg daily, continue following cardiology recommendations which are much appreciated - 3/12-14: Weight down significantly, BMP stable, monitor for s/s orthostasis  - 3/15: BUN increasing, Cr stable; weights downtrending, monitor closely 1-2 days and consider decrease Torsemide if indicated -Weight stabilizing, monitor; repeat BMP in a.m. with magnesium prior to discharge  3/18 Wt a little up today, cotinine to monitor trend, decrease irbesartan to 75mg  daily  12: Chronic BLE edema/lymphedema:              -  Unable to tolerate TED hose; may do well with ACE wrapping, d/w therapies for AM   -elevate legs while in bed  13: Permanent atrial fibrillation:             -continue Lopressor 50 mg BID             -DOAC when cleared by NS   14: OSA; does not use CPAP             -sleep medicine follow-up as outpatient   15: CKD stage 3a, prior baseline Cr appears 1.0-1.2 ; per cardiology likely unveiling prior worsening CKD now that she is at a normal volume, stabilizing at 1.4-1.5             -follow-up BMP   - 2/8: Admission labs elevated Cr 1.49; touching base with cardiology regarding wean from IV lasix,  encourage PO fluids with restriction <2L daily.   - 3/11: Creatinine is stable at 1.4  - 3/12: Creatinine stable 1.5, repeat 3/15 - 1.54, stable  -3/18 Cr stable at 1.55, continue to monitor   16. Vertigo/Dizziness.  -Stable  - Vestibular eval ordered - still pending - Meclizine 12.5 mg TID PRN  17.  Dysuria/urinary urgency.  Urgency preexisted current diuretic regimen, was untreated.-Improved -Urinalysis pending -Start Myrbetriq 25 mg daily -PVRs twice per to shift x 3 days to ensure no retention  -3/12: Much improved with the above.  Urinalysis showed UTI, started on Keflex for 7 days, culture pending.  Continue antibiotics for an additional 24 hours, monitor PVRs prior to adjusting Myrbetriq further. - 3/13: 1x high ?PVR vs bladder scan 250s; otherwise 0, with much improved urgency and frequency compared even to baseline; monitor on Myrbetriq at current dose -No recurrent retention, continue current medications.  E. coli UTI, sensitive to Keflex, finished treatment.  18.  Diarrhea - resolved -Continue Colace twice daily, move Senokot-S from nightly to as needed - LBM 3/17    LOS: 11 days A FACE TO FACE EVALUATION WAS PERFORMED  Jennye Boroughs 12/12/2022, 10:35 AM

## 2022-12-12 NOTE — Plan of Care (Signed)
  Problem: RH Balance Goal: LTG: Patient will maintain dynamic sitting balance (OT) Description: LTG:  Patient will maintain dynamic sitting balance with assistance during activities of daily living (OT) Outcome: Completed/Met Goal: LTG Patient will maintain dynamic standing with ADLs (OT) Description: LTG:  Patient will maintain dynamic standing balance with assist during activities of daily living (OT)  Outcome: Completed/Met   Problem: RH Grooming Goal: LTG Patient will perform grooming w/assist,cues/equip (OT) Description: LTG: Patient will perform grooming with assist, with/without cues using equipment (OT) Outcome: Completed/Met   Problem: RH Bathing Goal: LTG Patient will bathe all body parts with assist levels (OT) Description: LTG: Patient will bathe all body parts with assist levels (OT) Outcome: Completed/Met   Problem: RH Dressing Goal: LTG Patient will perform upper body dressing (OT) Description: LTG Patient will perform upper body dressing with assist, with/without cues (OT). Outcome: Completed/Met Goal: LTG Patient will perform lower body dressing w/assist (OT) Description: LTG: Patient will perform lower body dressing with assist, with/without cues in positioning using equipment (OT) Outcome: Completed/Met   Problem: RH Toileting Goal: LTG Patient will perform toileting task (3/3 steps) with assistance level (OT) Description: LTG: Patient will perform toileting task (3/3 steps) with assistance level (OT)  Outcome: Completed/Met   Problem: RH Functional Use of Upper Extremity Goal: LTG Patient will use RT/LT upper extremity as a (OT) Description: LTG: Patient will use right/left upper extremity as a stabilizer/gross assist/diminished/nondominant/dominant level with assist, with/without cues during functional activity (OT) Outcome: Completed/Met   Problem: RH Toilet Transfers Goal: LTG Patient will perform toilet transfers w/assist (OT) Description: LTG: Patient  will perform toilet transfers with assist, with/without cues using equipment (OT) Outcome: Completed/Met   Problem: RH Tub/Shower Transfers Goal: LTG Patient will perform tub/shower transfers w/assist (OT) Description: LTG: Patient will perform tub/shower transfers with assist, with/without cues using equipment (OT) Outcome: Completed/Met   Problem: RH Memory Goal: LTG Patient will demonstrate ability for day to day recall/carry over during activities of daily living with assistance level (OT) Description: LTG:  Patient will demonstrate ability for day to day recall/carry over during activities of daily living with assistance level (OT). Outcome: Completed/Met   Problem: RH Attention Goal: LTG Patient will demonstrate this level of attention during functional activites (OT) Description: LTG:  Patient will demonstrate this level of attention during functional activites  (OT) Outcome: Completed/Met   Problem: RH Awareness Goal: LTG: Patient will demonstrate awareness during functional activites type of (OT) Description: LTG: Patient will demonstrate awareness during functional activites type of (OT) Outcome: Completed/Met

## 2022-12-12 NOTE — Progress Notes (Signed)
Physical Therapy Discharge Summary  Patient Details  Name: [name] MRN: [id number] Date of Birth: Jun 29, 1941  Date of Discharge from PT service:December 12, 2022  Today's Date: 12/12/2022 PT Individual Time: [id number] PT Individual Time Calculation (min): 74 min    Patient has met 8 of 8 long term goals due to improved activity tolerance, improved balance, improved postural control, increased strength, and improved awareness.  Patient to discharge at an ambulatory level Supervision.   Patient's care partner is independent to provide the necessary physical and cognitive assistance at discharge.  Reasons goals not met: NA  Recommendation:  Patient will benefit from ongoing skilled PT services in outpatient setting to continue to advance safe functional mobility, address ongoing impairments in balance, endurance, ambulation, and minimize fall risk.  Equipment: No equipment provided  Reasons for discharge: treatment goals met and discharge from hospital  Patient/family agrees with progress made and goals achieved: Yes  Skilled Therapeutic Interventions Pt received seated in [facility] and agrees to therapy. No complaint of pain. Sit to stand with cues for hand placement. Pt ambulates x100' to gym with RW and cues for upright gaze to improve posture and balance. Pt completes ramp navigation with RW and cues for step height, then completes car transfer with RW and cues for sequencing. Pt then ambulates x125' with RW and completes x12 6" steps with bilateral hand rails and cues for step sequencing. Pt takes seated rest break prior to ambulating to dayroom with RW. Seated rest break prior to completing 6 minute walk test. Pt completes 6 minute walk test with RW, requiring x1 brief standing rest break, and records score of 714'. Pt educated on implication of 6 minute walk test score. Pt completes x14:00 on Nustep for endurance training. Pt completes at workload of 5 with average steps per minute ~40-45. PT  provides cues for hand and foot placement and completing full available ROM. PT provides pt with HEP and demonstrates each exercise. Following brief rest break, pt ambulates back to room, x300', with RW and similar cues. Pt left seated in recliner with all needs within reach.   PT Discharge Precautions/Restrictions Precautions Precautions: Fall Restrictions Weight Bearing Restrictions: No Pain Interference Pain Interference Pain Effect on Sleep: 1. Rarely or not at all Pain Interference with Therapy Activities: 1. Rarely or not at all Pain Interference with Day-to-Day Activities: 1. Rarely or not at all Vision/Perception  Vision - History Ability to See in Adequate Light: 1 Impaired Perception Perception: Impaired (cues at times for R sided visual awareness) Praxis Praxis: Impaired Praxis Impairment Details: Ideomotor Praxis-Other Comments: diminished yet improved from initial sessions, S required for higher level mobility and tasks  Cognition Overall Cognitive Status: Impaired/Different from baseline Arousal/Alertness: Awake/alert Attention: Focused;Sustained;Selective Focused Attention: Appears intact Sustained Attention: Appears intact Selective Attention: Impaired Selective Attention Impairment: Verbal basic Memory: Impaired Memory Impairment: Decreased recall of new information;Retrieval deficit Awareness: Appears intact Problem Solving: Impaired Executive Function: Sequencing;Organizing;Self Monitoring;Self Correcting Sequencing Impairment: Verbal complex Organizing: Impaired Safety/Judgment: Impaired Comments: Continues to require S upon d/c for safety during higher level mobility and multi step tasks Sensation Sensation Light Touch: Appears Intact Hot/Cold: Appears Intact Proprioception: Appears Intact Stereognosis: Appears Intact Coordination Gross Motor Movements are Fluid and Coordinated: Yes Fine Motor Movements are Fluid and Coordinated: No Finger Nose  Finger Test: improved significantly from IE, continues to be slowed from norm for finger to nose 9 Hole Peg Test: WFL's R 23 sec; L 22 sec Motor  Motor Motor: Hemiplegia;Abnormal postural alignment and  control Motor - Skilled Clinical Observations: significant improvement with residual dimished responses  Mobility Transfers Transfers: Sit to Stand;Stand to Sit;Stand Pivot Transfers Sit to Stand: Supervision/Verbal cueing Stand to Sit: Supervision/Verbal cueing Stand Pivot Transfers: Supervision/Verbal cueing Transfer (Assistive device): Rolling walker Locomotion  Gait Ambulation: Yes Gait Assistance: Supervision/Verbal cueing Gait Distance (Feet): 714 Feet Assistive device: Rolling walker Gait Assistance Details: Verbal cues for sequencing;Verbal cues for gait pattern;Verbal cues for technique Gait Gait: Yes Gait Pattern: Impaired Gait Pattern: Decreased stride length Gait velocity: decreased Stairs / Additional Locomotion Stairs: Yes Stairs Assistance: Supervision/Verbal cueing Stair Management Technique: Two rails Number of Stairs: 12 Height of Stairs: 6 Ramp: Supervision/Verbal cueing Curb: Supervision/Verbal cueing Wheelchair Mobility Wheelchair Mobility: No  Trunk/Postural Assessment  Cervical Assessment Cervical Assessment: Within Functional Limits Thoracic Assessment Thoracic Assessment: Within Functional Limits Lumbar Assessment Lumbar Assessment: Within Functional Limits Postural Control Postural Control: Deficits on evaluation Protective Responses: delayed but improved overall Postural Limitations: functional reach with unilateral supprt for safety  Balance Balance Balance Assessed: Yes Dynamic Sitting Balance Dynamic Sitting - Balance Support: Feet supported;During functional activity Dynamic Sitting - Level of Assistance: 6: Modified independent (Device/Increase time) Static Standing Balance Static Standing - Balance Support: During functional  activity;Bilateral upper extremity supported Static Standing - Level of Assistance: 6: Modified independent (Device/Increase time) Dynamic Standing Balance Dynamic Standing - Balance Support: During functional activity;Bilateral upper extremity supported Dynamic Standing - Level of Assistance: 5: Stand by assistance Extremity Assessment  RUE Assessment RUE Assessment: Within Functional Limits LUE Assessment LUE Assessment: Within Functional Limits General Strength Comments: grossly WFL's Bly with graded control deficits > general strength issues RLE Assessment RLE Assessment: Exceptions to [facility] General Strength Comments: Grossly 4+/5, endurance impaired LLE Assessment LLE Assessment: Exceptions to [facility] General Strength Comments: Grossly 4/5, endurance impaired   Breck Coons, PT, DPT 12/12/2022, 5:03 PM

## 2022-12-12 NOTE — Plan of Care (Signed)
?  Problem: RH Problem Solving ?Goal: LTG Patient will demonstrate problem solving for (SLP) ?Description: LTG:  Patient will demonstrate problem solving for basic/complex daily situations with cues  (SLP) ?Outcome: Completed/Met ?  ?Problem: RH Memory ?Goal: LTG Patient will use memory compensatory aids to (SLP) ?Description: LTG:  Patient will use memory compensatory aids to recall biographical/new, daily complex information with cues (SLP) ?Outcome: Completed/Met ?  ?Problem: RH Attention ?Goal: LTG Patient will demonstrate this level of attention during functional activites (SLP) ?Description: LTG:  Patient will will demonstrate this level of attention during functional activites (SLP) ?Outcome: Completed/Met ?  ?Problem: RH Awareness ?Goal: LTG: Patient will demonstrate awareness during functional activites type of (SLP) ?Description: LTG: Patient will demonstrate awareness during functional activites type of (SLP) ?Outcome: Completed/Met ?  ?

## 2022-12-12 NOTE — Progress Notes (Signed)
Speech Language Pathology Discharge Summary  Patient Details  Name: [name] MRN: [id number] Date of Birth: 1941/02/05  Date of Discharge from SLP service:December 12, 2022  Today's Date: 12/12/2022 SLP Individual Time: 1330-1415 SLP Individual Time Calculation (min): 45 min   Skilled Therapeutic Interventions:  Skilled treatment session focused on cognitive goals and completion of education. SLP facilitated session by re-administering the [facility] Mental Status Examination (SLUMS). Patient scored 23 /30 points with a score of 27 or above considered normal with ongoing deficits in attention and executive functioning. SLP provided education regarding memory compensatory strategies and how to live a healthy brain lifestyle. Patient verbalized understanding of all information and handouts were given for reinforcement. Patient left upright in wheelchair with all needs within reach.     Patient has met 4 of 4 long term goals.  Patient to discharge at overall Supervision level.   Reasons goals not met: N/A   Clinical Impression/Discharge Summary: Patient has made excellent gains and has met 4 of 4 LTGs this admission. Currently, patient demonstrates behaviors consistent with a [place]o Level VIII and requires overall supervision level verbal cues to complete functional and mildly complex tasks safely in regards to problem solving, recall with use of strategies, selective attention and anticipatory awareness. Patient and family education is complete and patient will discharge home with 24 hour supervision from family. Patient would benefit from f/u SLP services to maximize her cognitive functioning and overall functional independence in order to reduce caregiver burden.   Care Partner:  Caregiver Able to Provide Assistance: Yes  Type of Caregiver Assistance: Physical;Cognitive  Recommendation:  Outpatient SLP;24 hour supervision/assistance  Rationale for SLP Follow Up: Maximize  cognitive function and independence;Reduce caregiver burden   Equipment: N/A   Reasons for discharge: Discharged from hospital;Treatment goals met   Patient/Family Agrees with Progress Made and Goals Achieved: Yes    Kery Batzel 12/12/2022, 2:38 PM

## 2022-12-12 NOTE — Progress Notes (Signed)
Patient ID: [name], female   DOB: [birth year], 82 y.o.   MRN: [id number]  SW spoke with pt dtr [name] to follow-up about discharge for tomorrow. Confirms they remain in agreement with discharge tomorrow. SW reviewed discharge and how PCP will need to send outpatient therapy referral. Remembers appointment is scheduled for 3/25 at 1:30pm with Dr. Carlis Abbott. She reports her brother will be here to pick up their mother. SW shared d/c time between 9am-11am.  She understands pt will be sent home with HEP (home exercise program) until pt is able to begin her outpatient therapies.   *SW spoke with [name] at [facility] ([phone]/f:[phone]) to inform on recommendation now changing from  home health to Outpatient therapy. SW will fax discharge summaries/clinicals.   [name], MSW, [place] Office: [phone] Cell: [phone] Fax: [phone]

## 2022-12-12 NOTE — Progress Notes (Signed)
Occupational Therapy Discharge Summary  Patient Details  Name: [name] MRN: [id number] Date of Birth: 10-Oct-1940  Date of Discharge from [facility] service:December 12, 2022  Today's Date: 12/12/2022 OT Individual Time: 800-915, [id number] OT Individual Time Calculation (min): 75 min, 15 min    Patient has met 8 of 8 long term goals due to improved activity tolerance, improved balance, postural control, ability to compensate for deficits, functional use of  RIGHT upper, RIGHT lower, LEFT upper, and LEFT lower extremity, improved attention, improved awareness, and improved coordination.  Patient to discharge at overall Supervision level.  Patient's care partner is independent to provide the necessary physical and cognitive assistance at discharge.    Reasons goals not met: n/a  Recommendation:  Patient will benefit from ongoing skilled OT services in outpatient setting to continue to advance functional skills in the area of BADL, iADL, and Reduce care partner burden.  Equipment: 3 in 1 commode; has AE, RW and shower DME from prev surgeries   Reasons for discharge: treatment goals met  Patient/family agrees with progress made and goals achieved: Yes  OT Discharge Precautions/Restrictions    General   Vital Signs Therapy Vitals Temp: 97.6 F (36.4 C) Temp Source: Oral Pulse Rate: 76 Resp: 18 BP: (!) 105/58 Patient Position (if appropriate): Sitting Oxygen Therapy SpO2: 100 % O2 Device: Room Air Pain   ADL ADL Equipment Provided: Long-handled sponge Eating: Independent Where Assessed-Eating: Chair Grooming: Independent Where Assessed-Grooming: Sitting at sink Upper Body Bathing: Supervision/safety Where Assessed-Upper Body Bathing: Shower Lower Body Bathing: Modified independent Where Assessed-Lower Body Bathing: Shower Upper Body Dressing: Supervision/safety Where Assessed-Upper Body Dressing: Sitting at sink Lower Body Dressing: Modified independent Where  Assessed-Lower Body Dressing: Sitting at sink, Standing at sink Toileting: Supervision/safety, Modified independent Toilet Transfer: Close supervision Toilet Transfer Method: Counselling psychologist: Energy manager: Close supervision Social research officer, government Method: Heritage manager: Shower seat with back ADL Comments: mod I overall seated level, S for toilet and shower transfer as well as RW access to sink. 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 Pt continues to require increased time and deliberate head turns for scanning to R side but relatively aware of visual deficits and higher level balance needs. Vision Baseline Vision/History: 1 Wears glasses Patient Visual Report: Blurring of vision;Other (comment) (continues to occasionally overshoot or miss objects on r side, R VF WNL's with confrontation) Vision Assessment?: Vision impaired- to be further tested in functional context;Yes Visual Fields: No apparent deficits (upon confrontation test even R sup/later/inferior quadrants despite limited perception) Depth Perception: Overshoots Perception  Perception: Impaired (cues at times for R sided visual awareness) Praxis Praxis: Impaired Praxis Impairment Details: Ideomotor Praxis-Other Comments: diminished yet improved from initial sessions, S required for higher level mobility and tasks Cognition Cognition Overall Cognitive Status: Impaired/Different from baseline Arousal/Alertness: Awake/alert Orientation Level: Person;Place;Situation Person: Oriented Place: Oriented Memory: Impaired Memory Impairment: Decreased recall of new information;Retrieval deficit Attention: Focused;Sustained;Selective Focused Attention: Appears intact Sustained Attention: Appears intact Selective Attention: Impaired Selective Attention Impairment: Verbal basic Awareness: Appears intact Problem  Solving: Impaired Executive Function: Sequencing;Organizing;Self Monitoring;Self Correcting Sequencing Impairment: Verbal complex Organizing: Impaired Safety/Judgment: Impaired Comments: Continues to require S upon d/c for safety during higher level mobility and multi step tasks Brief Interview for Mental Status (BIMS) Repetition of Three Words (First Attempt): 3 Temporal Orientation: Year: Correct Temporal Orientation: Month: Accurate within 5 days Temporal Orientation: Day: Correct  Recall: "Sock": Yes, no cue required Recall: "Blue": Yes, no cue required Recall: "Bed": Yes, no cue required BIMS Summary Score: 15 Sensation Sensation Light Touch: Appears Intact Hot/Cold: Appears Intact Proprioception: Appears Intact Stereognosis: Appears Intact Coordination Gross Motor Movements are Fluid and Coordinated: Yes Fine Motor Movements are Fluid and Coordinated: No Finger Nose Finger Test: improved significantly from IE, continues to be slowed from norm for finger to nose 9 Hole Peg Test: WFL's R 23 sec; L 22 sec Motor  Motor Motor: Hemiplegia;Abnormal postural alignment and control Motor - Skilled Clinical Observations: significant improvement with residual dimished responses Mobility  Transfers Sit to Stand: Supervision/Verbal cueing Stand to Sit: Supervision/Verbal cueing  Trunk/Postural Assessment  Cervical Assessment Cervical Assessment: Within Functional Limits Thoracic Assessment Thoracic Assessment: Within Functional Limits Lumbar Assessment Lumbar Assessment: Within Functional Limits Postural Control Postural Control: Deficits on evaluation Protective Responses: delayed but improved overall Postural Limitations: functional reach with unilateral supprt for safety  Balance Balance Balance Assessed: Yes Dynamic Sitting Balance Dynamic Sitting - Balance Support: Feet supported;During functional activity Dynamic Sitting - Level of Assistance: 6: Modified independent  (Device/Increase time) Static Standing Balance Static Standing - Balance Support: During functional activity;Bilateral upper extremity supported Static Standing - Level of Assistance: 6: Modified independent (Device/Increase time) Dynamic Standing Balance Dynamic Standing - Balance Support: During functional activity;Bilateral upper extremity supported Dynamic Standing - Level of Assistance: 5: Stand by assistance Extremity/Trunk Assessment RUE Assessment RUE Assessment: Within Functional Limits    OT Interventions/Treatment:  1st Session:  Pt seen for skilled OT session this am. Pt up in w/c completing am meal and eager for full shower and dressing routine including hair washing. Pt was able to perform amb with RW for gathering items from dresser to lay on bed, amb to stall shower and doff clothes with S. Pt utilized LH sponge, grab bar and shower seat for bathing with S only. Pt amb with RW back to w/c set at sink side and performed hair and oral care standing with functional time. Pt sat w/c level to don LB clothing over feet without reacher offered and RW support to stand for garment mngt. Sock donning without AE. Used LH shoe horn for slip on tennis shoes mod I. On 2 occasions pt required cues for wide head turn to R side due to decreased scanning for item with 1 overshoot noted while reaching. Upon visual confrontation, no deficits noted. BIMS 15. Left pt with seat alarm active, needs and call button in reach. Rec for outpt OT and 3 in 1 commode.     2nd Session:   MSW contacted team via secure chat that outpt may delay initiation and to set pt up with HEP for discharge. OT added a brief visit to instruct in strategies to improve cog/visual/perceptual skills as well as issued and trained in foam cube squeezes and light yellow tband for B UE HEP. Written HEP issued and pt able to teach back. No further OT needs in CIR. Left pt in w/c with all safety measures and needs in reach.   Barnabas Lister 12/12/2022, 3:40 PM

## 2022-12-13 ENCOUNTER — Other Ambulatory Visit (HOSPITAL_COMMUNITY): Payer: Self-pay

## 2022-12-13 DIAGNOSIS — S069XAS Unspecified intracranial injury with loss of consciousness status unknown, sequela: Secondary | ICD-10-CM | POA: Diagnosis not present

## 2022-12-13 NOTE — Progress Notes (Signed)
Inpatient Rehabilitation Care Coordinator Discharge Note   Patient Details  Name: Normajean Rupinski MRN: [id number] Date of Birth: 10/05/1940   Discharge location: D/c to home  Length of Stay: 11 days  Discharge activity level: Supervision  Home/community participation: Limited  Patient response [id number] Literacy - How often do you need to have someone help you when you read instructions, pamphlets, or other written material from your doctor or pharmacy?: Never  Patient response [id number] Isolation - How often do you feel lonely or isolated from those around you?: Never  Services provided included: MD, RD, PT, OT, SLP, RN, CM, TR, Pharmacy, Neuropsych, SW  Financial Services:  Financial Services Utilized: Medicare    Choices offered to/list presented to: patient and pt dtr [name]  Follow-up services arranged:  Other (Comment) ([facility] with recommendation of outpatient therapy for PT/OT/SLP  faxed to PCP Dr. Carlis Abbott.)   Patient response to transportation need: Is the patient able to respond to transportation needs?: Yes In the past 12 months, has lack of transportation kept you from medical appointments or from getting medications?: No In the past 12 months, has lack of transportation kept you from meetings, work, or from getting things needed for daily living?: No   Comments (or additional information):  Patient/Family verbalized understanding of follow-up arrangements:  Yes  Individual responsible for coordination of the follow-up plan: contact pt dtr [name] [phone]  Confirmed correct DME delivered: Rana Snare 12/13/2022    Rana Snare

## 2022-12-13 NOTE — Progress Notes (Signed)
Closed out nursing education and care plan for discharge.

## 2022-12-13 NOTE — Progress Notes (Signed)
Inpatient Rehabilitation Discharge Medication Review by a Pharmacist   A complete drug regimen review was completed for this patient to identify any potential clinically significant medication issues.   High Risk Drug Classes Is patient taking? Indication by Medication  Antipsychotic No   Anticoagulant No    Antibiotic No    Opioid No    Antiplatelet No    Hypoglycemics/insulin No   Vasoactive Medication Yes Lopressor,  Torsemide,  Avapro, Spironolactone- HFpEF  Chemotherapy No    Other Yes Ferrous sulfate oral- iron deficiency Omeprazole- GERD Vitamin B12- supplement Melatonin- sleep Potassium chloride- supplement        Type of Medication Issue Identified Description of Issue Recommendation(s)  Drug Interaction(s) (clinically significant)        Duplicate Therapy        Allergy        No Medication Administration End Date        Incorrect Dose        Additional Drug Therapy Needed        Significant med changes from prior encounter (inform family/care partners about these prior to discharge).      Other   PTA meds: Albuterol inh Apixaban Omega-3   Restarted  PTA meds albuterol inhalation, omega-3.    Apixaban  remains on hold per  Inpatient rehab MD discussion with Neurosurgery, noting that given significant risk of bleed and falls would not recommend resuming anticoagulation at this time per neurosurgery. Can defer to outpatient.  Patient to f/u with cardiolgist at appointment 12/15/22.        Clinically significant medication issues were identified that warrant physician communication and completion of prescribed/recommended actions by midnight of the next day:  No   Time spent performing this drug regimen review (minutes): [place], [place] Clinical Pharmacist 12/13/2022 12:42 PM

## 2022-12-13 NOTE — Progress Notes (Signed)
Patient ID: [name], female   DOB: 1941-07-02, 82 y.o.   MRN: [id number]  SW faxed clinicals to Dr. Ozella Rocks ([phone]/f:[phone]) for upcoming appt with recommendation of outpatient therapies.   [name], MSW, [place] Office: [phone] Cell: [phone] Fax: [phone]

## 2022-12-13 NOTE — Progress Notes (Addendum)
PROGRESS NOTE   Subjective/Complaints:  No acute complaints.  No events overnight.  Notable on discharge prep but Myrbetriq is prohibitively expensive, greater than $300 per month with current insurance.  Discussed with patient possible alternative options for discharge.  Feel that with her recent brain injury and with optimization of her fluid weight with torsemide, and treating of UTI, urinary urgency may have improved overall without medication and would not trial a drug like Ditropan with more anticholinergic effects without ability to monitor closely.  Patient agreeable to discontinuing Myrbetriq on discharge, will see how she does tonight, has follow-up with her PCP in the morning and can request further medications for urinary urgency from there.  All her questions answered.  Patient stable for discharge.  ROS: + Vertigo -improved, urinary urgency much improved, dysuria-resolved, and diarrhea-resolved..  Patient denies fever, rash, sore throat, blurred vision, dizziness, nausea, vomiting,  Abdominal pain, cough, shortness of breath or chest pain,  headache, or mood change.   Objective:   No results found. Recent Labs    12/12/22 0758  WBC 7.2  HGB 13.4  HCT 41.3  PLT 218    Recent Labs    12/12/22 0758  NA 135  K 3.8  CL 98  CO2 28  GLUCOSE 112*  BUN 29*  CREATININE 1.55*  CALCIUM 9.2     Intake/Output Summary (Last 24 hours) at 12/13/2022 1240 Last data filed at 12/13/2022 [id number] Gross per 24 hour  Intake 920 ml  Output --  Net 920 ml         Physical Exam: Vital Signs Blood pressure 119/69, pulse 81, temperature 98.9 F (37.2 C), temperature source Oral, resp. rate 18, height 5\' 8"  (1.727 m), weight 111.2 kg, last menstrual period 02/07/2012, SpO2 97 %. Constitutional: No distress . Vital signs reviewed. HEENT: NCAT, conjugate gaze, oral membranes moist Neck: supple Cardiovascular: RRR with sys  murmur. No JVD    Respiratory/Chest: CTA Bilaterally without wheezes or rales. Normal effort    GI/Abdomen: BS +, non-tender, non-distended Ext: no clubbing, cyanosis.  Bilateral lower extremity lymphedema, legs appear much smaller compared to admission, with some wrinkling of overlying skin -stable from over this weekend Psych: pleasant and cooperative  Skin: Multiple bruises on bilateral cheeks, arm, and knees. - resolving Chronic venous stasis changed BL LE L>R. Raised polyps on L shin and medial ankle.  - unchanged MSK:      No apparent deformity.      Strength: All 4 limbs antigravity and against resistance  Neurologic exam:  AAOx3. + Much improved processing delay, no longer evident with normal conversation although does present with higher level cognitive tasks Sensory exam: RLE sensory deficit in distal plantar foot to mid-shin. - ongoing Coordination: + Ataxia on FTN in bilateral UE, L>R -no longer present on discharge exam    Assessment/Plan: 1. Functional deficits which require 3+ hours per day of interdisciplinary therapy in a comprehensive inpatient rehab setting. Physiatrist is providing close team supervision and 24 hour management of active medical problems listed below. Physiatrist and rehab team continue to assess barriers to discharge/monitor patient progress toward functional and medical goals  Care Tool:  Bathing  Body parts bathed by patient: Right arm, Left arm, Chest, Abdomen, Right upper leg, Left upper leg, Face, Right lower leg, Left lower leg, Front perineal area, Buttocks   Body parts bathed by helper: Front perineal area, Buttocks, Right lower leg, Left lower leg     Bathing assist Assist Level: Supervision/Verbal cueing     Upper Body Dressing/Undressing Upper body dressing   What is the patient wearing?: Pull over shirt, Bra    Upper body assist Assist Level: Independent    Lower Body Dressing/Undressing Lower body dressing      What is  the patient wearing?: Underwear/pull up, Pants     Lower body assist Assist for lower body dressing: Supervision/Verbal cueing     Toileting Toileting    Toileting assist Assist for toileting: Supervision/Verbal cueing     Transfers Chair/bed transfer  Transfers assist  Chair/bed transfer activity did not occur: Safety/medical concerns  Chair/bed transfer assist level: Supervision/Verbal cueing     Locomotion Ambulation   Ambulation assist      Assist level: Supervision/Verbal cueing Assistive device: Walker-rolling Max distance: 714'   Walk 10 feet activity   Assist     Assist level: Supervision/Verbal cueing Assistive device: Walker-rolling   Walk 50 feet activity   Assist Walk 50 feet with 2 turns activity did not occur: Safety/medical concerns  Assist level: Supervision/Verbal cueing Assistive device: Walker-rolling    Walk 150 feet activity   Assist Walk 150 feet activity did not occur: Safety/medical concerns  Assist level: Supervision/Verbal cueing Assistive device: Walker-rolling    Walk 10 feet on uneven surface  activity   Assist Walk 10 feet on uneven surfaces activity did not occur: Safety/medical concerns   Assist level: Supervision/Verbal cueing Assistive device: Walker-rolling   Wheelchair     Assist Is the patient using a wheelchair?: No Type of Wheelchair: Manual    Wheelchair assist level: Dependent - Patient 0% Max wheelchair distance: 150'    Wheelchair 50 feet with 2 turns activity    Assist        Assist Level: Dependent - Patient 0%   Wheelchair 150 feet activity     Assist      Assist Level: Dependent - Patient 0%   Blood pressure 119/69, pulse 81, temperature 98.9 F (37.2 C), temperature source Oral, resp. rate 18, height 5\' 8"  (1.727 m), weight 111.2 kg, last menstrual period 02/07/2012, SpO2 97 %. Medical Problem List and Plan: 1. 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 Per PT, walked 250 ft CGA with a walker. Mid-Mod A for Cog, not expecting Mod I at home for cog. Will need aide at home but progressing very well functionally. `   - 3/15: Family requesting IPR extension. Patient is stable at [facility] level for the past few days with PT and OT. Do not feel additional time would improve prognosis or allow for improved goals at this time. D/w patient barriers to discharge home, primary issues patient anxiety and family coordination. Discussed risks vs. Benefits of prolonged hospital stay with patient, to which she was understanding.   - 3/16: Discussed discharge  plan with daughter Jeani Hawking, answered all questions.  Will call daughter [name] tomorrow.  - 3/17: Repeated discussion with son and daughter [name], all concerns addressed, time taken >30 minutes on family education.  Patient and family requesting outpatient therapies to be set up closer to their home in [place], [place] or [place], [place].  Will coordinate this with social work,  2.  Antithrombotics: -DVT/anticoagulation:  Mechanical:  Antiembolism stockings, knee (TED hose) Bilateral lower extremities>>patient states she refuses compression stockings; can ACE wrap when OOB  - Was on Eliquis 5 mg BID PTA, Will need to clear resumption with Dr. Christella Noa for discharge vs. OP with repeat imaging - reaching out today 3/15; per Dr. Christella Noa given significant risk of bleed and falls would not recommend resuming anticoagulation at this time.  Can defer to outpatient.             - antiplatelet therapy: none   3. Pain Management: Tylenol as needed   4. Mood/Behavior/Sleep: LCSW to evaluate and provide emotional support             -antipsychotic agents: n/a   -will add low  dose melatonin tonight for sleep 3/10   -has trazodone prn which hasn't been used  5. Neuropsych/cognition: This patient is capable of making decisions on her own behalf.   6. Skin/Wound Care: Routine skin care checks   - 3/10: ?cellulitis LLE -improved. No signs of active infection; self resolved without treatment    7. Fluids/Electrolytes/Nutrition: Strict Is and Os and follow-up chemistries             -fluid restriction <2 liters             -continue KCl             -continue B12   - admission labs mag, k stable   - Decreasing PO mag from 800 mg BID to Daily 3/9; repeat on Monday- -added onto a.m. 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:              -fluid restriction <2 liters             -daily weight (dry weight 270 lbs per OP meds)             -continue Jardiance 10 mg daily             -continue spironolactone 25 mg daily             -continue irbesartan 150 mg daily             -continue magnesium oxide 400 mg BID -> daily 3/9             -continue KCl 10 mEq daily Filed Weights   12/11/22 0429 12/12/22 0458 12/13/22 0500  Weight: 110.7 kg 110.6 kg  111.2 kg   -3/10 weights appear to be down-trending, now on lasix 40mg  bid - 3/11: Weight stable, on torsemide 40 mg daily, continue following cardiology recommendations which are much appreciated - 3/12-14: Weight down significantly, BMP stable, monitor for s/s orthostasis  - 3/15: BUN increasing, Cr stable; weights  downtrending, monitor closely 1-2 days and consider decrease Torsemide if indicated -Weight stabilizing, monitor; repeat BMP in a.m. with magnesium prior to discharge  3/18 Wt a little up today, cotinine to monitor trend, decrease irbesartan to 75mg  daily - 3/18: Weight is overall stable, much improved from admission, discharged on current regimen and follow-up with cardiology  12: Chronic BLE edema/lymphedema:              -Unable to tolerate TED hose; may do well with ACE wrapping, d/w therapies for AM   -elevate legs while in bed  13: Permanent atrial fibrillation:             -continue Lopressor 50 mg BID             -DOAC when cleared by NS   14: OSA; does not use CPAP             -sleep medicine follow-up as outpatient   15: CKD stage 3a, prior baseline Cr appears 1.0-1.2 ; per cardiology likely unveiling prior worsening CKD now that she is at a normal volume, stabilizing at 1.4-1.5             -follow-up BMP   - 2/8: Admission labs elevated Cr 1.49; touching base with cardiology regarding wean from IV lasix, encourage PO fluids with restriction <2L daily.   - 3/11: Creatinine is stable at 1.4  - 3/12: Creatinine stable 1.5, repeat 3/15 - 1.54, stable  -3/18 Cr stable at 1.5, continue to monitor   16. Vertigo/Dizziness.  -Stable  - Vestibular eval ordered - still pending - Meclizine 12.5 mg TID PRN  17.  Dysuria/urinary urgency.  Urgency preexisted current diuretic regimen, was untreated.-Improved -Urinalysis pending -Start Myrbetriq 25 mg daily -PVRs twice per to shift x 3 days to ensure no retention  -3/12: Much improved with the above.  Urinalysis showed UTI, started on Keflex for 7 days, culture pending.  Continue antibiotics for an additional 24 hours, monitor PVRs prior to adjusting Myrbetriq further. - 3/13: 1x high ?PVR vs bladder scan 250s; otherwise 0, with much improved urgency and frequency compared even to baseline; monitor on Myrbetriq at current dose - 3/19:  Myrbetriq prohibitively expensive on discharge.  Patient agreeable to trialing evening without it and following up with PCP tomorrow.  Feel that Myrbetriq is a better medication than Ditropan in her case given history of recurrent falls, recent brain injury.  Can try alternate medication if needed as outpatient.   18.  Diarrhea - resolved -Continue Colace twice daily, move Senokot-S from nightly to as needed - LBM 3/17    LOS: 12 days A FACE TO [place] 12/13/2022, 12:40 PM

## 2022-12-15 ENCOUNTER — Ambulatory Visit: Payer: Medicare Other | Attending: Nurse Practitioner | Admitting: Nurse Practitioner

## 2022-12-15 ENCOUNTER — Encounter: Payer: Self-pay | Admitting: Nurse Practitioner

## 2022-12-15 VITALS — BP 102/70 | HR 99 | Ht 68.0 in | Wt 241.6 lb

## 2022-12-15 DIAGNOSIS — R7989 Other specified abnormal findings of blood chemistry: Secondary | ICD-10-CM

## 2022-12-15 DIAGNOSIS — I4819 Other persistent atrial fibrillation: Secondary | ICD-10-CM | POA: Diagnosis present

## 2022-12-15 DIAGNOSIS — Z9181 History of falling: Secondary | ICD-10-CM | POA: Diagnosis present

## 2022-12-15 DIAGNOSIS — M7989 Other specified soft tissue disorders: Secondary | ICD-10-CM

## 2022-12-15 DIAGNOSIS — I890 Lymphedema, not elsewhere classified: Secondary | ICD-10-CM | POA: Diagnosis present

## 2022-12-15 DIAGNOSIS — I5032 Chronic diastolic (congestive) heart failure: Secondary | ICD-10-CM

## 2022-12-15 DIAGNOSIS — S069X9S Unspecified intracranial injury with loss of consciousness of unspecified duration, sequela: Secondary | ICD-10-CM | POA: Diagnosis present

## 2022-12-15 NOTE — Patient Instructions (Signed)
Medication Instructions:  Your physician recommends that you continue on your current medications as directed. Please refer to the Current Medication list given to you today.   Labwork: none  Testing/Procedures: none  Follow-Up:  Your physician recommends that you schedule a follow-up appointment in: Follow up with [facility] in April  Any Other Special Instructions Will Be Listed Below (If Applicable).  If you need a refill on your cardiac medications before your next appointment, please call your pharmacy.

## 2022-12-15 NOTE — Progress Notes (Addendum)
Office Visit    Patient Name: [name] Date of Encounter: 12/15/2022  PCP:  Cathie Olden, MD   [place]  Cardiologist:  Carlyle Dolly, MD  Advanced Practice Provider:  Finis Bud, NP Electrophysiologist:  None   Chief Complaint    [name] is a 82 y.o. 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   Past Medical History    Past Medical History:  Diagnosis Date   Anemia    Anxiety    Arthritis    Back pain    reason unknown   Cataract    bilateral immature   Diarrhea    Diverticulosis    GERD (gastroesophageal reflux disease)    takes Omeprazole daily   Headache(784.0)    occasionally   Hiatal hernia    History of blood transfusion    no abnormal reaction noted   History of bronchitis 2014   History of colon polyps    History of shingles    Hypertension    takes Metoprolol,Amlodipine and Losartan daily   Internal hemorrhoids    Joint pain    Joint swelling    Lung nodule    Nocturia    Numbness    hands occasionally   Osteoarthritis    Peripheral edema    takes HCTZ daily and Furosemide daily as needed   PONV (postoperative nausea and vomiting)    Redness    to both lower extremities   Shortness of breath    WITH EXERTION    Sleep apnea    doesn't use cpap   Urinary frequency    Urinary urgency    Venous insufficiency    Past Surgical History:  Procedure Laterality Date   ABDOMINAL HYSTERECTOMY  1983   ANKLE SURGERY Left 1993   ANTERIOR CRUCIATE LIGAMENT REPAIR Left 2013   BREAST SURGERY     LEFT SIDE BIOPSY 1989   CHOLECYSTECTOMY  1990   COLONOSCOPY     ENDOVENOUS ABLATION SAPHENOUS VEIN W/ LASER Right 04/17/2017   endovenous laser ablation R GSV by Tinnie Gens MD   ESOPHAGOGASTRODUODENOSCOPY     HAND SURGERY Left 2011    [place]  07/16/2012   Procedure: REPAIR QUADRICEP TENDON;  Surgeon: Rudean Haskell, MD;  Location: [place];  Service: Orthopedics;  Laterality: Left;  open repair of VMO    quiadricp  10'21/2012   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   Procedure: TOTAL KNEE ARTHROPLASTY;  Surgeon: Rudean Haskell, MD;  Location: [place];  Service: Orthopedics;  Laterality: Left;   TOTAL KNEE ARTHROPLASTY Right 11/04/2013   DR Ronnie Derby   TOTAL KNEE ARTHROPLASTY Right 11/04/2013   Procedure: RIGHT TOTAL KNEE ARTHROPLASTY;  Surgeon: Vickey Huger, MD;  Location: [place];  Service: Orthopedics;  Laterality: Right;   TUBAL LIGATION  1974   vasculitic eruption of legs  1985    Allergies  Allergies  Allergen Reactions   Sulfa Antibiotics     Reaction unknown   Adhesive [Tape] Other (See Comments) and Rash    Skin is very sensitive Skin is very sensitive Skin is very sensitive    History of Present Illness    [name] is a 82 y.o. female with a PMH as mentioned above.  Last seen by Dr. Carlyle Dolly on 08/25/2022. At the time, patient noted some  ongoing edema and labile weights. Lasix dosage was adjusted.   Saw this pt 10/2022 for follow-up for weight gain evaluation.  Noticed weight gain for a while, patient increased Lasix to 60 mg daily by herself. Had good results and weight was trending down. Endorsed recent mechanical fall, admitted to facial bruising, denied any red flag signs of symptoms. Obtained STAT CT of head/cervical spine that was negative for any acute finding, just soft tissue swelling noted. Referred to lymphedema clinic. Shortly after visit, had another fall, CT of head significant for head bleed, transferred to [facility]. Experienced agitation/focal seizure activity on way to [facility]. Received Andexxa. Repeat CT of head showed normal evolution of blood collections. Eliquis was on hold until cleared to resume by NeuroSurgery. UA showed E. Choli, tx with Keflex. Cardiology consulted d/t Lasix  dosing d/t elevated sCr, limited Echo below. Stayed/recovered in rehab for some time.   She presents for follow-up with son. Now back home. Doing well. Denies any chest pain, shortness of breath, palpitations, syncope, presyncope, dizziness, orthopnea, PND, acute bleeding, or claudication.  EKGs/Labs/Other Studies Reviewed:   The following studies were reviewed today:   EKG:  EKG is not ordered today.    Echo limited 11/29/2022:    1. Left ventricular ejection fraction, by estimation, is 55 to 60%. The  left ventricle has normal function. Left ventricular endocardial border  not optimally defined to evaluate regional wall motion. There is mild  concentric left ventricular  hypertrophy. Left ventricular diastolic function could not be evaluated.  There is the interventricular septum is flattened in systole and diastole,  consistent with right ventricular pressure and volume overload.   2. Right ventricular systolic function is moderately reduced. The right  ventricular size is severely enlarged. There is mildly elevated pulmonary  artery systolic pressure. The estimated right ventricular systolic  pressure is 70.6 mmHg.   3. Left atrial size was mildly dilated.   4. Right atrial size was mild to moderately dilated.   5. The mitral valve is grossly normal. Mild to moderate mitral valve  regurgitation. No evidence of mitral stenosis.   6. The tricuspid valve is abnormal. Tricuspid valve regurgitation is  severe.   7. The aortic valve is tricuspid. Aortic valve regurgitation is not  visualized.   8. The inferior vena cava is dilated in size with <50% respiratory  variability, suggesting right atrial pressure of 15 mmHg.   Comparison(s): Changes from prior study are noted. RV now severely  enlarged with moderately reduced function. Severe TR.   Conclusion(s)/Recommendation(s): Findings consistent with [facility].  Echo on 11/08/2021:  1. Left ventricular ejection fraction, by  estimation, is 60 to 65%. The  left ventricle has normal function. The left ventricle has no regional  wall motion abnormalities. There is moderate left ventricular hypertrophy.  Left ventricular diastolic  parameters are indeterminate.   2. Right ventricular systolic function is normal. The right ventricular  size is mildly enlarged. There is mildly elevated pulmonary artery  systolic pressure. The estimated right ventricular systolic pressure is  23.7 mmHg.   3. Left atrial size was severely dilated.   4. Right atrial size was moderately dilated.   5. The pericardial effusion is circumferential.   6. The mitral valve is normal in structure. Trivial mitral valve  regurgitation. No evidence of mitral stenosis.   7. Tricuspid valve regurgitation is severe.   8. The aortic valve is normal in structure. Aortic valve regurgitation is  not visualized. No aortic stenosis is present.  9. The inferior vena cava is dilated in size with <50% respiratory  variability, suggesting right atrial pressure of 15 mmHg.  Recent Labs: 11/28/2022: B Natriuretic Peptide 310.4 12/01/2022: TSH 3.248 12/02/2022: ALT 12 12/12/2022: BUN 29; Creatinine, Ser 1.55; Hemoglobin 13.4; Magnesium 1.8; Platelets 218; Potassium 3.8; Sodium 135  Recent Lipid Panel No results found for: "CHOL", "TRIG", "HDL", "CHOLHDL", "VLDL", "LDLCALC", "LDLDIRECT"  Risk Assessment/Calculations:   CHA2DS2-VASc Score = 5  This indicates a 7.2% annual risk of stroke. The patient's score is based upon: CHF History: 1 HTN History: 1 Diabetes History: 0 Stroke History: 0 Vascular Disease History: 0 Age Score: 2 Gender Score: 1    Home Medications   Current Meds  Medication Sig   acetaminophen (TYLENOL) 325 MG tablet Take 1-2 tablets (325-650 mg total) by mouth every 4 (four) hours as needed for mild pain.   albuterol (VENTOLIN HFA) 108 (90 Base) MCG/ACT inhaler Inhale 2 puffs into the lungs every 6 (six) hours as needed for wheezing  or shortness of breath.   Biotin 5000 MCG CAPS Take 1 capsule by mouth daily.   Calcium Citrate-Vitamin D 500-12.5 MG-MCG PACK Take by oral route.   ferrous sulfate 325 (65 FE) MG tablet Take 325 mg by mouth 2 (two) times daily with a meal.   Glucosamine-Chondroitin (GLUCOSAMINE CHONDR COMPLEX PO) Take 1 capsule by mouth daily.   irbesartan (AVAPRO) 75 MG tablet Take 1 tablet (75 mg total) by mouth daily.   magnesium oxide (MAG-OX) 400 MG tablet Take 1 tablet (400 mg total) by mouth daily.   melatonin 3 MG TABS tablet Take 1 tablet (3 mg total) by mouth at bedtime.   metoprolol tartrate (LOPRESSOR) 50 MG tablet TAKE 1 TABLET BY MOUTH TWICE A DAY   omeprazole (PRILOSEC) 40 MG capsule Take 40 mg by mouth every morning.   potassium chloride SA (KLOR-CON M) 20 MEQ tablet Take 2 tablets (40 mEq total) by mouth daily.   spironolactone (ALDACTONE) 25 MG tablet Take 1 tablet (25 mg total) by mouth daily.   torsemide (DEMADEX) 20 MG tablet Take 2 tablets (40 mg total) by mouth daily.   triamcinolone lotion (KENALOG) 0.1 % Apply 1 application  topically 3 (three) times daily. For legs   vitamin B-12 (CYANOCOBALAMIN) 1000 MCG tablet Take 1,000 mcg by mouth daily.     Review of Systems    All other systems reviewed and are otherwise negative except as noted above.  Physical Exam    VS:  BP 102/70 (BP Location: Left Arm, Patient Position: Sitting, Cuff Size: Large)   Pulse 99   Ht 5\' 8"  (1.727 m)   Wt 241 lb 9.6 oz (109.6 kg)   LMP 02/07/2012   SpO2 100%   BMI 36.74 kg/m  , BMI Body mass index is 36.74 kg/m.  Wt Readings from Last 3 Encounters:  12/15/22 241 lb 9.6 oz (109.6 kg)  12/13/22 245 lb 2.4 oz (111.2 kg)  11/27/22 289 lb (131.1 kg)     GEN: Obese, 82 y.o. female in no acute distress. HEENT: healing bruising to face, overall normal. Neck: Supple, no JVD, carotid bruits, or masses. Cardiac: S1/S2, irregular rhythm and regular rate, no murmurs, rubs, or gallops. No clubbing,  cyanosis. Lymphedema noted (L>R).  Radials 2+/PT 1+ and equal bilaterally.  Respiratory:  Respirations regular and unlabored, clear to auscultation bilaterally. GI: Soft, nontender, nondistended. Skin: Warm and dry, no rash, scaling legs, bruising noted to face Neuro:  Strength and sensation are intact. Psych:  Normal affect.  Assessment & Plan    Chronic diastolic CHF, lymphedema/leg swelling TTE 11/2022 showed EF 55-60%, mild LVH, with findings consistent of RV pressure and volume overload, Improved leg edema and weight is down. Continue Lasix 60 mg daily. Continue current GDMT per son's request. Follow-up with lymphedema clinic. Low sodium diet, fluid restriction <2L, and daily weights encouraged. Educated to contact our office for weight gain of 2 lbs overnight or 5 lbs in one week.  History of frequent falls, Hx of TBI d/t brain bleed Recovered from brain bleed. Follow-up with Neurosurgery. Discussed risks vs benefits of AC and pt declines. Care precautions and ED precautions discussed. Heart healthy diet encouraged. Continue to follow with PCP.   3. Persistent A-fib, not on anticoagulation HR well controlled. Denies any palpitations or tachycardia. Continue Lopressor. Not on AC d/t #2. Discussed risks vs benefits of AC and pt declines, which I am in agreement with. Discussed other tx options. Will route note to cardiologist with recs if pt would be [facility] candidate. Continue current medication regimen. Heart healthy diet and regular cardiovascular exercise encouraged.   Addendum: Case d/w Dr. Dina Rich who stated, "Not at the moment, even after placement would need to be on anticoag for a few weeks which with recent bleed would have to hold on. Would stay hold off for now, reassess at f/u."  4. Elevated serum creatinine Recent labs showed peaked sCr at 1.55 with eGFR at 29. Recommended adjusting medications but son declines at this time, has upcoming labs arranged with PCP. If no  improvement in sCr, recommend holding irbesartan x 3-4 days and then resume with close follow-up of BMET. Encouraged adequate hydration. Avoid nephrotoxic agents. Continue to follow with PCP.   Disposition: Patient is requesting to follow-up with Dr. Wyline Mood as scheduled. Follow up with Dina Rich, MD or APP as scheduled.   Signed, Sharlene Dory, NP 12/16/2022, 12:56 PM [facility]

## 2022-12-16 NOTE — Addendum Note (Signed)
Addended by: Merlene Laughter on: 12/16/2022 03:21 PM   Modules accepted: Orders

## 2022-12-26 ENCOUNTER — Encounter: Payer: Self-pay | Admitting: Physical Medicine and Rehabilitation

## 2023-01-02 NOTE — Discharge Summary (Signed)
Physician Discharge Summary  Patient ID: [name] MRN: [id number] DOB/AGE: 1941/02/20 82 y.o.  Admit date: 11/27/2022 Discharge date: 12/01/2022  Admission Diagnoses:traumatic SAH, traumatic subdural hematoma  Discharge Diagnoses:  Principal Problem:   ICH (intracerebral hemorrhage)   Discharged Condition: fair  Hospital Course: [name] was admitted after multiple falls and a head ct showed traumatic hemorrhages in the sah, and subdural space. I stopped the eliquis given for atrial fibrillation. She had improved mental status during the hospitalization. No operation performed. Was alert, oriented x 4, with clear and fluent speech at discharge. Moving all extremities.  Treatments: physical and occupational therapy  Discharge Exam: Blood pressure 120/78, pulse 80, temperature (!) 97.5 F (36.4 C), temperature source Oral, resp. rate 18, height 5\' 8"  (1.727 m), weight 131.1 kg, last menstrual period 02/07/2012, SpO2 95 %. General appearance: alert, cooperative, appears older than stated age, and mild distress  Disposition: Discharge disposition: [facility] Not Defined      * No surgery found *  Allergies as of 12/01/2022       Reactions   Sulfa Antibiotics    Reaction unknown   Adhesive [tape] Other (See Comments), Rash   Skin is very sensitive Skin is very sensitive Skin is very sensitive        Medication List     ASK your doctor about these medications    albuterol 108 (90 Base) MCG/ACT inhaler Commonly known as: Ventolin HFA Inhale 2 puffs into the lungs every 6 (six) hours as needed for wheezing or shortness of breath.   Biotin 5000 MCG Caps Take 1 capsule by mouth daily.   Calcium Citrate-Vitamin D 500-12.5 MG-MCG Pack Take by oral route.   cyanocobalamin 1000 MCG tablet Commonly known as: VITAMIN B12 Take 1,000 mcg by mouth daily.   ferrous sulfate 325 (65 FE) MG tablet Take 325 mg by mouth 2 (two) times daily with a  meal.   GLUCOSAMINE CHONDR COMPLEX PO Take 1 capsule by mouth daily.   metoprolol tartrate 50 MG tablet Commonly known as: LOPRESSOR TAKE 1 TABLET BY MOUTH TWICE A DAY Ask about: Which instructions should I use?   OMEGA-3 FISH OIL PO Take 1 capsule by mouth daily.   omeprazole 40 MG capsule Commonly known as: PRILOSEC Take 40 mg by mouth every morning.   potassium chloride SA 20 MEQ tablet Commonly known as: KLOR-CON M Take 2 tablets (40 mEq total) by mouth daily.   triamcinolone lotion 0.1 % Commonly known as: KENALOG Apply 1 application  topically 3 (three) times daily. For legs   vitamin E 180 MG (400 UNITS) capsule Take 400 Units by mouth daily.         SignedColetta Memos 01/02/2023, 5:42 PM

## 2023-01-09 ENCOUNTER — Encounter
Payer: Medicare Other | Attending: Physical Medicine and Rehabilitation | Admitting: Physical Medicine and Rehabilitation

## 2023-01-09 VITALS — BP 100/67 | HR 67 | Ht 68.0 in | Wt 218.6 lb

## 2023-01-09 DIAGNOSIS — S069XAS Unspecified intracranial injury with loss of consciousness status unknown, sequela: Secondary | ICD-10-CM | POA: Diagnosis not present

## 2023-01-09 DIAGNOSIS — R269 Unspecified abnormalities of gait and mobility: Secondary | ICD-10-CM

## 2023-01-09 DIAGNOSIS — I5032 Chronic diastolic (congestive) heart failure: Secondary | ICD-10-CM | POA: Diagnosis not present

## 2023-01-09 MED ORDER — IRBESARTAN 75 MG PO TABS: 75.0000 mg | ORAL_TABLET | Freq: Every day | ORAL | 0 refills | Status: DC

## 2023-01-09 MED ORDER — SPIRONOLACTONE 25 MG PO TABS: 25.0000 mg | ORAL_TABLET | Freq: Every day | ORAL | 0 refills | Status: DC

## 2023-01-09 NOTE — Patient Instructions (Addendum)
           [facility] Follow up.   Why: Call the office in 1-2 days to make arrangments for hospital follow-up appointment. Contact information: [address]     [address] [place] [address] [phone]      Please follow-up with neurology as above if needed for cognitive concerns, persistent deficits.  Continue outpatient therapies.  I am happy to write a prescription return to therapy if you feel you are discharge prematurely without meeting specific goals.  You should be safe to ambulate in your home to and from the bathroom at nighttime, and to have a caregiver for assistance during the day.  Please wear your fall alert device if moving around the house without supervision.  Today, I refilled your spironolactone and irbesartan.  Please follow your cardiologist recommendations for adjusting medications, including likely decrease or discontinuation of your torsemide based on low blood pressures and elevated kidney function.  I will also send this message to their office.  Follow-up with me in 3 months

## 2023-01-09 NOTE — Progress Notes (Unsigned)
Subjective:    Patient ID: [name], female    DOB: 08/18/1941, 82 y.o.   MRN: [id number]  HPI  Brief HPI:   [name] is a 82 y.o. female who presented to the emergency department at [facility] on with reports of recent fall approximately 2 days prior to presentation. CT head significant for head bleed and transferred to [facility]. During transport, she was noted to experience increasing agitation and focal seizure activity. She was given IV Versed and IV Keppra. She is maintained on Eliquis for permanent atrial fibrillation. This was reversed with Andexxa. Repeat CT head scan showed normal evolution of cerebral blood collections of the subarachnoid and subdural spaces. No further seizure activity. Cardiology consulted for history of a. fib, heart failure and lower extremity edema. Eliquis on hold until cleared to resume by NS. AKI noted. Suspect that lower extremity edema is primarily driven by combination of lymphedema and chronic venous insufficiency. Discussed follow-up of OSA. Diuretics ongoing. No plan in notes for transitioning to oral Lasix. Tolerating diet. NS sees no need for repeat CT head.      Hospital Course: [name] was admitted to rehab 12/01/2022 for inpatient therapies to consist of PT, ST and OT at least three hours five days a week. Past admission physiatrist, therapy team and rehab RN have worked together to provide customized collaborative inpatient rehab. Re-consulted cardiology in regards to Lasix dosing with rising creatinine. Reduced dose to 40 mg BID on 3/09. Serum creatinine increased to 1.5 and remained stable. Myrbetriq started for urinary urgency.  Decreased Mag-ox to 400 mg daily on 3/09. Lasix decreased to 40 mg daily on 3/11. UA showed E. Coli and treated with Keflex for seven days. Continued to monitor PVRs. Improved. Heart rate controlled on Lopressor. CBC normal on 3/18. Discussed with Dr. Franky Macho given significant risk of bleed and falls  would not recommend resuming anticoagulation at this time. Can defer to outpatient. Myrbetriq is prohibitively expensive at discharge ($349/month). Will plan to discontinue now that UTI fully treated. Can follow up with her PCP and they might be able to start her on ditropan or get a prior authorization done for the Myrbetriq.    Blood pressures were monitored on TID basis and Jardiance 10 mg daily, Lasix 80 mg daily, spironolactone 25 mg daily, irbesartan 150 mg daily and magnesium oxide 400 mg BID continued. Jardiance discontinued on 3/12. Irbesartan decreased to 75 mg daily on 3/18 due to soft BP.   Rehab course: During patient's stay in rehab weekly team conferences were held to monitor patient's progress, set goals and discuss barriers to discharge. At admission, patient required mod assist with basic self-care skills and with mobility.   She  has had improvement in activity tolerance, balance, postural control as well as ability to compensate for deficits. She has had improvement in functional use RUE/LUE  and RLE/LLE as well as improvement in awareness  Interval Hx:  - Therapies: Going well. Brought therapy notes with her today, noted mostly ambulation at [facility] level and limited stairs trials. Uses gait belt in the community. Furniture walks at home.   - Follow ups: saw Dr. Franky Macho 12/09/22.   - Falls: None, no near misses.   - WGN:FAOZ walker in the home and in the community.   - Medications: needs a few refills today. Still on irbesartan, spironolactone, and torsemide. States her cardiologist.   - Other concerns: So far has had 24/7 supervision. She does get up and  go to the bathroom by herself at nighttime. She is getting a private caregiver from 9 am to 5 pm to assist in ADLs.   - She has adjusted her diet since coming home; lost almost 30 lbs since leaving he hospital. No interntional weight loss, endorses some reduced appetite but has been eating. Son says before her fall she was drinking  a lot of soda/cherry pepsi, now no longer drinking anyone.   Pain Inventory Average Pain 3 Pain Right Now 0 My pain is dull and aching  LOCATION OF PAIN  head, back, headaches when 1st discharged  BOWEL Number of stools per week: 14 Oral laxative use Yes  Type of laxative Benefiber Enema or suppository use No  History of colostomy No  Incontinent No   BLADDER Normal In and out cath, frequency . Able to self cath  . Bladder incontinence No  Frequent urination Yes  Leakage with coughing Yes  Difficulty starting stream No  Incomplete bladder emptying No    Mobility walk with assistance use a walker how many minutes can you walk? 10-15 ability to climb steps?  yes do you drive?  no  Function retired I need assistance with the following:  meal prep, household duties, and shopping  Neuro/Psych trouble walking dizziness confusion  Prior Studies Hospital f/u  Physicians involved in your care Hospital f/u   Family History  Problem Relation Age of Onset   Lymphoma Father    Leukemia Father    Heart attack Father    Hypertension Mother    Emphysema Mother    Heart disease Sister    Rashes / Skin problems Sister    Heart failure Sister    Kidney disease Sister    Heart disease Maternal Grandfather    Heart disease Paternal Grandmother    Heart disease Paternal Grandfather    Social History   Socioeconomic History   Marital status: Single    Spouse name: Not on file   Number of children: 3   Years of education: 14   Highest education level: Not on file  Occupational History   Occupation: retired  Tobacco Use   Smoking status: Never    Passive exposure: Never   Smokeless tobacco: Never  Vaping Use   Vaping Use: Never used  Substance and Sexual Activity   Alcohol use: No    Alcohol/week: 0.0 standard drinks of alcohol   Drug use: No   Sexual activity: Never    Birth control/protection: Surgical  Other Topics Concern   Not on file  Social  History Narrative   Fun: Exercise class twice per week.    Denies abuse and feels safe at home.    Social Determinants of Health   Financial Resource Strain: Not on file  Food Insecurity: Not on file  Transportation Needs: Not on file  Physical Activity: Not on file  Stress: Not on file  Social Connections: Not on file   Past Surgical History:  Procedure Laterality Date   ABDOMINAL HYSTERECTOMY  1983   ANKLE SURGERY Left 1993   ANTERIOR CRUCIATE LIGAMENT REPAIR Left 2013   BREAST SURGERY     LEFT SIDE BIOPSY 1989   CHOLECYSTECTOMY  1990   COLONOSCOPY     ENDOVENOUS ABLATION SAPHENOUS VEIN W/ LASER Right 04/17/2017   endovenous laser ablation R GSV by Josephina Gip MD   ESOPHAGOGASTRODUODENOSCOPY     HAND SURGERY Left 2011   QUADRICEPS TENDON REPAIR  07/16/2012   Procedure: REPAIR QUADRICEP TENDON;  Surgeon: Raymon Mutton, MD;  Location: [facility] OR;  Service: Orthopedics;  Laterality: Left;  open repair of VMO    quiadricp  10'21/2012   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   Procedure: TOTAL KNEE ARTHROPLASTY;  Surgeon: Raymon Mutton, MD;  Location: [facility] OR;  Service: Orthopedics;  Laterality: Left;   TOTAL KNEE ARTHROPLASTY Right 11/04/2013   DR Sherlean Foot   TOTAL KNEE ARTHROPLASTY Right 11/04/2013   Procedure: RIGHT TOTAL KNEE ARTHROPLASTY;  Surgeon: Dannielle Huh, MD;  Location: [facility] OR;  Service: Orthopedics;  Laterality: Right;   TUBAL LIGATION  1974   vasculitic eruption of legs  1985   Past Medical History:  Diagnosis Date   Anemia    Anxiety    Arthritis    Back pain    reason unknown   Cataract    bilateral immature   Diarrhea    Diverticulosis    GERD (gastroesophageal reflux disease)    takes Omeprazole daily   Headache(784.0)    occasionally   Hiatal hernia    History of blood transfusion    no abnormal reaction noted   History of bronchitis 2014   History of colon polyps    History of shingles    Hypertension    takes Metoprolol,Amlodipine and Losartan daily    Internal hemorrhoids    Joint pain    Joint swelling    Lung nodule    Nocturia    Numbness    hands occasionally   Osteoarthritis    Peripheral edema    takes HCTZ daily and Furosemide daily as needed   PONV (postoperative nausea and vomiting)    Redness    to both lower extremities   Shortness of breath    WITH EXERTION    Sleep apnea    doesn't use cpap   Urinary frequency    Urinary urgency    Venous insufficiency    BP 100/67   Pulse 67   Ht 5\' 8"  (1.727 m)   Wt 218 lb 9.6 oz (99.2 kg)   LMP 02/07/2012   SpO2 96%   BMI 33.24 kg/m   Opioid Risk Score:   Fall Risk Score:  `1  Depression screen PHQ 2/9      No data to display           Review of Systems  Gastrointestinal:  Positive for nausea and vomiting.  Musculoskeletal:  Positive for back pain.  Skin:  Positive for rash.  Neurological:  Positive for dizziness and headaches.  Psychiatric/Behavioral:  Positive for confusion.   All other systems reviewed and are negative.     Objective:   Physical Exam   PE: Constitution: Appropriate appearance for age. No apparent distress. Has lost significant weight since last evaluation.  Resp: No respiratory distress. No accessory muscle usage. on RA and CTAB Cardio: Well perfused appearance. Chronic lymphedema much improved.  Abdomen: Nondistended. Nontender.   Psych: Appropriate mood and affect. Neuro: AAOx4. No apparent cognitive deficits   Neurologic Exam:   DTRs: Reflexes were 2+ in bilateral achilles, patella, biceps, BR and triceps. Babinsky: flexor responses b/l.   Hoffmans: negative b/l Sensory exam: revealed normal sensation in all dermatomal regions in bilateral upper extremities and bilateral lower extremities Motor exam: strength 5/5 throughout bilateral upper extremities and bilateral lower extremities Coordination: Fine motor coordination was normal.  Mild LUE ataxia.  Gait: normal with use of RW. Stable stance, strifde, turns, STS.        Assessment & Plan:  Amilee Janvier is a 82 y.o. year old female  who  has a past medical history of Anemia, Anxiety, Arthritis, Back pain, Cataract, Diarrhea, Diverticulosis, GERD (gastroesophageal reflux disease), Headache(784.0), Hiatal hernia, History of blood transfusion, History of bronchitis (2014), History of colon polyps, History of shingles, Hypertension, Internal hemorrhoids, Joint pain, Joint swelling, Lung nodule, Nocturia, Numbness, Osteoarthritis, Peripheral edema, PONV (postoperative nausea and vomiting), Redness, Shortness of breath, Sleep apnea, Urinary frequency, Urinary urgency, and Venous insufficiency.   They are presenting to PM&R clinic as a TOC for IPR admission for TBI, SAH and SDH, s/p fall.  Traumatic brain injury,  sequela Please follow-up with neurology as above if needed for cognitive concerns, persistent deficits. Follow-up with me in 3 months  Abnormality of gait and mobility Continue outpatient therapies.  I am happy to write a prescription return to therapy if you feel you are discharge prematurely without meeting specific goals.    You should be safe to ambulate in your home to and from the bathroom at nighttime, and to have a caregiver for assistance during the day.    Please wear your fall alert device if moving around the house without supervision.  Chronic diastolic heart failure Today, I refilled your spironolactone and irbesartan.    Please follow your cardiologist recommendations for adjusting medications, including likely decrease or discontinuation of your torsemide based on low blood pressures and elevated kidney function.    I will also send this message to their office.  Other orders -     Irbesartan; Take 1 tablet (75 mg total) by mouth daily.  Dispense: 30 tablet; Refill: 0 -     Spironolactone; Take 1 tablet (25 mg total) by mouth daily.  Dispense: 30 tablet; Refill: 0

## 2023-01-17 ENCOUNTER — Ambulatory Visit: Payer: Medicare Other | Attending: Cardiology | Admitting: Cardiology

## 2023-01-17 ENCOUNTER — Encounter: Payer: Self-pay | Admitting: Cardiology

## 2023-01-17 VITALS — BP 84/52 | HR 70 | Ht 68.0 in | Wt 215.6 lb

## 2023-01-17 DIAGNOSIS — I10 Essential (primary) hypertension: Secondary | ICD-10-CM | POA: Diagnosis present

## 2023-01-17 DIAGNOSIS — I5032 Chronic diastolic (congestive) heart failure: Secondary | ICD-10-CM | POA: Insufficient documentation

## 2023-01-17 DIAGNOSIS — I4819 Other persistent atrial fibrillation: Secondary | ICD-10-CM | POA: Diagnosis present

## 2023-01-17 DIAGNOSIS — Z79899 Other long term (current) drug therapy: Secondary | ICD-10-CM | POA: Diagnosis present

## 2023-01-17 NOTE — Progress Notes (Signed)
Clinical Summary [name] is a 82 y.o.female seen today for follow up of the following medical problems.    1.DOE/Chronic diastolic HF - seen by pulmonary - 1 month history of DOE - transient improvement with prednisone, also some improvement with prn albuterol. Does have history of allergic bronchitis    - 11/2018 echo LVEF 55-60%, indet diastolic fxn 11-26-21 echo: LVEF 60-65%, noWMAs, indet diastolic, normal RV, mild PASP 41 mmHg, severe LAE, RA pressure 15, severe TR   11/2022 echo: LVEF 55-60%. Indet diastolic fxn. Dshaped septum systole and diastole, mod RV dysfunction       - home weights 265-272 lbs. Takes lasix 40mg  daily, not taking her HCTZ.  - swelling is stable though can have some weeping from legs. Breathing doing well.     -weight 12/14/21 was 241 lbs - today 215 lbs Cr up to 2 -legs swelling much improved - home scales down 80 lbs since 2022-11-26 - taking torsemide 40mg  daily.  - some dizzy spells - decreased appetitre - no SOB/DOE   2.HTN - home bp's 100-120s/50s-60s     3. 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  - no symptoms  - from neurosurg note thought that eliquis greater risk than benefit - no recent palpitations -no recent falls     4. OSA - not using a cpap, has not been interested in        5. Chronic venous insufficiency - followed by vascular Dr Hart Rochester - swelling up and down.  - previously seen in wound clinic.  - wears compression stockings  - has compression socks        6. Large complex cystic mass - noted by [place] medial right thigh, stable from prior US 4 months ago - normal Hgb on check, we have continued anticoag. 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, traumatic head bleed - managed without surgery - still going to physical therapy  SH: her significant other is [name] who is also a patient of mine who passed away  2019-11-27 Past Medical History:  Diagnosis Date   Anemia    Anxiety    Arthritis    Back pain    reason unknown   Cataract    bilateral immature   Diarrhea    Diverticulosis    GERD (gastroesophageal reflux disease)    takes Omeprazole daily   Headache(784.0)    occasionally   Hiatal hernia    History of blood transfusion    no abnormal reaction noted   History of bronchitis 2014   History of colon polyps    History of shingles    Hypertension    takes Metoprolol,Amlodipine and Losartan daily   Internal hemorrhoids    Joint pain    Joint swelling    Lung nodule    Nocturia    Numbness    hands occasionally   Osteoarthritis    Peripheral edema    takes HCTZ daily and Furosemide daily as needed   PONV (postoperative nausea and vomiting)    Redness    to both lower extremities   Shortness of breath    WITH EXERTION    Sleep apnea    doesn't use cpap   Urinary frequency    Urinary urgency    Venous insufficiency      Allergies  Allergen Reactions  Sulfa Antibiotics     Reaction unknown   Adhesive [Tape] Other (See Comments) and Rash    Skin is very sensitive Skin is very sensitive Skin is very sensitive     Current Outpatient Medications  Medication Sig Dispense Refill   acetaminophen (TYLENOL) 325 MG tablet Take 1-2 tablets (325-650 mg total) by mouth every 4 (four) hours as needed for mild pain.     albuterol (VENTOLIN HFA) 108 (90 Base) MCG/ACT inhaler Inhale 2 puffs into the lungs every 6 (six) hours as needed for wheezing or shortness of breath. (Patient not taking: Reported on 01/09/2023) 1 each 6   Biotin 5000 MCG CAPS Take 1 capsule by mouth daily.     Calcium Citrate-Vitamin D 500-12.5 MG-MCG PACK Take by oral route.     ferrous sulfate 325 (65 FE) MG tablet Take 325 mg by mouth 2 (two) times daily with a meal.     Glucosamine-Chondroitin (GLUCOSAMINE CHONDR COMPLEX PO) Take 1 capsule by mouth daily.     irbesartan (AVAPRO) 75 MG tablet Take 1  tablet (75 mg total) by mouth daily. 30 tablet 0   magnesium oxide (MAG-OX) 400 MG tablet Take 1 tablet (400 mg total) by mouth daily. 30 tablet 0   melatonin 3 MG TABS tablet Take 1 tablet (3 mg total) by mouth at bedtime. 30 tablet 0   metoprolol tartrate (LOPRESSOR) 50 MG tablet TAKE 1 TABLET BY MOUTH TWICE A DAY 180 tablet 1   Omega-3 Fatty Acids (OMEGA-3 FISH OIL PO) Take 1 capsule by mouth daily. (Patient not taking: Reported on 12/15/2022)     omeprazole (PRILOSEC) 40 MG capsule Take 40 mg by mouth every morning.     potassium chloride SA (KLOR-CON M) 20 MEQ tablet Take 2 tablets (40 mEq total) by mouth daily. 60 tablet 3   spironolactone (ALDACTONE) 25 MG tablet Take 1 tablet (25 mg total) by mouth daily. 30 tablet 0   torsemide (DEMADEX) 20 MG tablet Take 2 tablets (40 mg total) by mouth daily. 60 tablet 0   triamcinolone lotion (KENALOG) 0.1 % Apply 1 application  topically 3 (three) times daily. For legs     vitamin B-12 (CYANOCOBALAMIN) 1000 MCG tablet Take 1,000 mcg by mouth daily.     vitamin E 180 MG (400 UNITS) capsule Take 400 Units by mouth daily. (Patient not taking: Reported on 12/15/2022)     No current facility-administered medications for this visit.     Past Surgical History:  Procedure Laterality Date   ABDOMINAL HYSTERECTOMY  1983   ANKLE SURGERY Left 1993   ANTERIOR CRUCIATE LIGAMENT REPAIR Left 2013   BREAST SURGERY     LEFT SIDE BIOPSY 1989   CHOLECYSTECTOMY  1990   COLONOSCOPY     ENDOVENOUS ABLATION SAPHENOUS VEIN W/ LASER Right 04/17/2017   endovenous laser ablation R GSV by Josephina Gip MD   ESOPHAGOGASTRODUODENOSCOPY     HAND SURGERY Left 2011   QUADRICEPS TENDON REPAIR  07/16/2012   Procedure: REPAIR QUADRICEP TENDON;  Surgeon: Raymon Mutton, MD;  Location: [facility] OR;  Service: Orthopedics;  Laterality: Left;  open repair of VMO    quiadricp  10'21/2012   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   Procedure: TOTAL KNEE ARTHROPLASTY;  Surgeon: Raymon Mutton, MD;  Location: [facility] OR;  Service: Orthopedics;  Laterality: Left;   TOTAL KNEE ARTHROPLASTY Right 11/04/2013   DR Sherlean Foot   TOTAL KNEE ARTHROPLASTY Right 11/04/2013   Procedure: RIGHT TOTAL KNEE ARTHROPLASTY;  Surgeon: Dannielle Huh, MD;  Location[facility] OR;  Service: Orthopedics;  Laterality: Right;   TUBAL LIGATION  1974   vasculitic eruption of legs  1985     Allergies  Allergen Reactions   Sulfa Antibiotics     Reaction unknown   Adhesive [Tape] Other (See Comments) and Rash    Skin is very sensitive Skin is very sensitive Skin is very sensitive      Family History  Problem Relation Age of Onset   Lymphoma Father    Leukemia Father    Heart attack Father    Hypertension Mother    Emphysema Mother    Heart disease Sister    Rashes / Skin problems Sister    Heart failure Sister    Kidney disease Sister    Heart disease Maternal Grandfather    Heart disease Paternal Grandmother    Heart disease Paternal Grandfather      Social History Ms. Stanislaw reports that she has never smoked. She has never been exposed to tobacco smoke. She has never used smokeless tobacco. Ms. Accetta reports no history of alcohol use.   Review of Systems CONSTITUTIONAL: No weight loss, fever, chills, weakness or fatigue.  HEENT: Eyes: No visual loss, blurred vision, double vision or yellow sclerae.No hearing loss, sneezing, congestion, runny nose or sore throat.  SKIN: No rash or itching.  CARDIOVASCULAR: per hpi RESPIRATORY: per hpi GASTROINTESTINAL: No anorexia, nausea, vomiting or diarrhea. No abdominal pain or blood.  GENITOURINARY: No burning on urination, no polyuria NEUROLOGICAL: No headache, dizziness, syncope, paralysis, ataxia, numbness or tingling in the extremities. No change in bowel or bladder control.  MUSCULOSKELETAL: No muscle, back pain, joint pain or stiffness.  LYMPHATICS: No enlarged nodes. No history of splenectomy.  PSYCHIATRIC: No history of depression or anxiety.   ENDOCRINOLOGIC: No reports of sweating, cold or heat intolerance. No polyuria or polydipsia.  Marland Kitchen   Physical Examination Today's Vitals   01/17/23 1555 01/17/23 1608 01/17/23 1609  BP: (!) 80/60 (!) 79/51 (!) 84/52  Pulse:  85 70  SpO2: 96% 95% 100%  Weight: 215 lb 9.6 oz (97.8 kg)    Height: 5\' 8"  (1.727 m)     Body mass index is 32.78 kg/m.  Gen: resting comfortably, no acute distress HEENT: no scleral icterus, pupils equal round and reactive, no palptable cervical adenopathy,  CV: irreg no m/rg, no jvd Resp: Clear to auscultation bilaterally GI: abdomen is soft, non-tender, non-distended, normal bowel sounds, no hepatosplenomegaly MSK: extremities are warm, no edema.  Skin: warm, no rash Neuro:  no focal deficits Psych: appropriate affect   Diagnostic Studies  11/2018 echo IMPRESSIONS      1. The left ventricle has normal systolic function, with an ejection fraction of 55-60%. The cavity size was normal. There is moderate concentric left ventricular hypertrophy. Left ventricular diastolic Doppler parameters are indeterminate secondary to  atrial fibrillation. No evidence of left ventricular regional wall motion abnormalities.  2. The right ventricle has normal systolic function. The cavity was normal. There is no increase in right ventricular wall thickness.  3. Left atrial size was moderate to severely dilated.  4. Right atrial size was mildly dilated.  5. There is mild mitral annular calcification present.  6. The tricuspid valve is grossly normal.  7. The aortic valve is tricuspid.  8. The aortic root is normal in size and structure.     10/2021 echo 1. Left ventricular ejection fraction, by estimation, is 60 to 65%. The  left  ventricle has normal function. The left ventricle has no regional  wall motion abnormalities. There is moderate left ventricular hypertrophy.  Left ventricular diastolic  parameters are indeterminate.   2. Right ventricular systolic  function is normal. The right ventricular  size is mildly enlarged. There is mildly elevated pulmonary artery  systolic pressure. The estimated right ventricular systolic pressure is  40.6 mmHg.   3. Left atrial size was severely dilated.   4. Right atrial size was moderately dilated.   5. The pericardial effusion is circumferential.   6. The mitral valve is normal in structure. Trivial mitral valve  regurgitation. No evidence of mitral stenosis.   7. Tricuspid valve regurgitation is severe.   8. The aortic valve is normal in structure. Aortic valve regurgitation is  not visualized. No aortic stenosis is present.   9. The inferior vena cava is dilated in size with <50% respiratory  variability, suggesting right atrial pressure of 15 mmHg.   Assessment and Plan  1.Chronic diastolic HF - euvolemic, will continue current meds. Update bmet   2. Persistent afib/acquired thrombophilia - no symptoms, continue current meds including eliquis for stroke prevention - history of SDH/SAH after fall, off anticoag. Refer for watchman discussion   3.HTN - low bp's, we will d/c irbesartan - significant diuresis and weight loss over last several months      Antoine Poche, M.D.

## 2023-01-17 NOTE — Patient Instructions (Addendum)
Medication Instructions:  Stop Irbesartan (Avapro) Continue all other medications.     Labwork: BMET, Mg - orders given   Testing/Procedures: none  Follow-Up: 4 weeks   Any Other Special Instructions Will Be Listed Below (If Applicable). You have been referred to:  Dr. Lalla Brothers  Please call the office in 1 week to give update on BP readings   If you need a refill on your cardiac medications before your next appointment, please call your pharmacy.

## 2023-01-24 ENCOUNTER — Encounter: Payer: Self-pay | Admitting: Cardiology

## 2023-01-25 ENCOUNTER — Telehealth: Payer: Self-pay

## 2023-01-25 DIAGNOSIS — Z79899 Other long term (current) drug therapy: Secondary | ICD-10-CM

## 2023-01-25 MED ORDER — TORSEMIDE 20 MG PO TABS: 20.0000 mg | ORAL_TABLET | Freq: Every day | ORAL | 0 refills | Status: DC

## 2023-01-25 NOTE — Telephone Encounter (Signed)
Patient notified and verbalized understanding. Patient had no questions or concerns at this time.  

## 2023-01-25 NOTE — Telephone Encounter (Signed)
Patient sent MyChart message on 4/30:   Blood Pressure reading since last visit...01-17-23... April 25th   100/57   P 79 April 26th   127/65   P81 April 27th   126/68   P88 April 28th   116/60   P80 April 29th   111/55   P96 April 30th   94/52   P91 Also, did you receive results from Labs on 01-19-23, do I need to make an Med changes? Thank You   Provider responded:  Labs show some decline in kidney function, potassium is also high. Verify taking torsemide 40mg  daily. If so would hold for 2 days, then start back lower dose of 20mg  daily. Stop her daily potassium. Repeat bmet in 2 weeks   Dominga Ferry MD    Left a message for patient to call office back regarding medication changes.

## 2023-01-25 NOTE — Telephone Encounter (Signed)
Labs show some decline in kidney function, potassium is also high. Verify taking torsemide 40mg  daily. If so would hold for 2 days, then start back lower dose of 20mg  daily. Stop her daily potassium. Repeat bmet in 2 weeks  Dominga Ferry MD

## 2023-01-26 ENCOUNTER — Other Ambulatory Visit: Payer: Self-pay | Admitting: Cardiology

## 2023-01-31 ENCOUNTER — Other Ambulatory Visit: Payer: Self-pay | Admitting: Physical Medicine and Rehabilitation

## 2023-01-31 ENCOUNTER — Encounter: Payer: Self-pay | Admitting: Cardiology

## 2023-02-03 ENCOUNTER — Other Ambulatory Visit: Payer: Self-pay

## 2023-02-03 ENCOUNTER — Encounter: Payer: Self-pay | Admitting: Cardiology

## 2023-02-03 NOTE — Telephone Encounter (Signed)
Error

## 2023-02-04 ENCOUNTER — Other Ambulatory Visit: Payer: Self-pay | Admitting: Physical Medicine and Rehabilitation

## 2023-02-06 ENCOUNTER — Encounter: Payer: Self-pay | Admitting: Cardiology

## 2023-02-06 ENCOUNTER — Telehealth: Payer: Self-pay | Admitting: Cardiology

## 2023-02-06 NOTE — Telephone Encounter (Signed)
Patient returned RN's call regarding results.  Patient states can also leave message in MyChart.

## 2023-02-07 ENCOUNTER — Ambulatory Visit: Payer: Medicare Other | Attending: Cardiology | Admitting: Cardiology

## 2023-02-07 ENCOUNTER — Other Ambulatory Visit: Payer: Self-pay | Admitting: *Deleted

## 2023-02-07 ENCOUNTER — Telehealth: Payer: Self-pay | Admitting: *Deleted

## 2023-02-07 ENCOUNTER — Encounter: Payer: Self-pay | Admitting: Cardiology

## 2023-02-07 VITALS — BP 124/80 | HR 72 | Ht 68.0 in | Wt 216.0 lb

## 2023-02-07 DIAGNOSIS — I4819 Other persistent atrial fibrillation: Secondary | ICD-10-CM | POA: Diagnosis not present

## 2023-02-07 DIAGNOSIS — S069X9S Unspecified intracranial injury with loss of consciousness of unspecified duration, sequela: Secondary | ICD-10-CM | POA: Diagnosis present

## 2023-02-07 MED ORDER — SPIRONOLACTONE 25 MG PO TABS: 25.0000 mg | ORAL_TABLET | Freq: Every day | ORAL | 3 refills | Status: DC

## 2023-02-07 MED ORDER — TORSEMIDE 20 MG PO TABS: 20.0000 mg | ORAL_TABLET | Freq: Every day | ORAL | 3 refills | Status: DC

## 2023-02-07 NOTE — Patient Instructions (Addendum)
Medication Instructions:  Your physician recommends that you continue on your current medications as directed. Please refer to the Current Medication list given to you today.  *If you need a refill on your cardiac medications before your next appointment, please call your pharmacy*   Follow-Up: At [facility], you and your health needs are our priority.  As part of our continuing mission to provide you with exceptional heart care, we have created designated Provider Care Teams.  These Care Teams include your primary Cardiologist (physician) and Advanced Practice Providers (APPs -  Physician Assistants and Nurse Practitioners) who all work together to provide you with the care you need, when you need it.  We will be in touch with you after you see your neurosurgeon

## 2023-02-07 NOTE — Telephone Encounter (Signed)
Tabitha Poche, MD 02/06/2023  2:15 PM EDT     Kidney function significantly improved,. Would continue torremide 20mg  daily, can take additional 20mg  as needed for significant swelling or weight gain.   Dominga Ferry MD    Patient notified via MyChart.

## 2023-02-07 NOTE — Progress Notes (Signed)
Electrophysiology Office Note:    Date:  02/07/2023   ID:  Hedvig Pellot, DOB 1941/05/15, MRN [id number]  [facility] Cardiologist:  Dina Rich, MD  [facility] [place] [facility] Electrophysiologist:  Lanier Prude, MD   Referring MD: Gennie Alma*   Chief Complaint: AF  History of Present Illness:    [name] is a 82 y.o. female who I am seeing today for an evaluation fo AF at the request of Dr Wyline Mood. She has a hx of persistent AF, HTN, OSA, lymphedema, TBI, SAH/SDH s/p fall.   She is not currently taking OAC due to her previous falls.   Today, she is accompanied by her daughter. She confirms having two falls that were 5 days apart. Her second fall was on a Friday, and then the next Sunday she developed a brain bleed. She was hospitalized 11/2022 and since then has remained off of anticoagulation. Currently she is in physical therapy twice a week.   In the past she has had issues with occasional, intermittent epistaxis. At one time the bleeding lasted for a prolonged time and she called urgent care for assistance. She was guided through the episode and the bleeding did resolve eventually without needing surgical intervention. She denies any recent issues with epistaxis.  Her blood pressure is well controlled in the office today.  In her family, she reports that both her mother and one of her daughters had a brain aneurysm.  She denies any palpitations, chest pain, shortness of breath, or peripheral edema. No lightheadedness, headaches, syncope, orthopnea, or PND.     Their past medical, social and family history was reveiwed.   ROS:   Please see the history of present illness.    All other systems reviewed and are negative.  EKGs/Labs/Other Studies Reviewed:    The following studies were reviewed today:  11/29/2022 Echo EF 55 RV moderately reduced Dilated LA/RA Mild-mod MR Severe TR   Physical Exam:    VS:  BP 124/80   Pulse 72   Ht 5\' 8"  (1.727 m)   Wt  216 lb (98 kg)   LMP 02/07/2012   SpO2 95%   BMI 32.84 kg/m     Wt Readings from Last 3 Encounters:  02/07/23 216 lb (98 kg)  01/17/23 215 lb 9.6 oz (97.8 kg)  01/09/23 218 lb 9.6 oz (99.2 kg)     GEN:  Well nourished, well developed in no acute distress CARDIAC: RRR, no murmurs, rubs, gallops RESPIRATORY:  Clear to auscultation without rales, wheezing or rhonchi       ASSESSMENT AND PLAN:    1. Persistent atrial fibrillation (HCC)   2. Traumatic brain injury with loss of consciousness, sequela (HCC)     #Persistent AF Likely permanent. Not on OAC due to hx of SAH/SDH and falls. I have discussed her AF related stroke risk in detail. I discussed LAAO as a mechanism to reduce stroke risk.  If she elects to proceed with Watchman would plan for DAPT implant. She would start Plavix 7 days prior to procedure. On the day of implant, Aspirin would be added. She will need to touch base with her neurosurgeon to confirm we are OK to restart antiplatelet therapy after her ICH in setting of fall. I did send a note to Dr Franky Macho this evening asking about this issue.  ------------------  I have seen [name] in the office today who is being considered for a Watchman left atrial appendage closure device. I believe they will benefit from this  procedure given their history of atrial fibrillation, CHA2DS2-VASc score of 5. Unfortunately, the patient is not felt to be a long term anticoagulation candidate secondary to hx of SAH/SDH. The patient's chart has been reviewed and I feel that they would be a candidate for short term oral anticoagulation after Watchman implant.   It is my belief that after undergoing a LAA closure procedure, Stephana Hadad will not need long term anticoagulation which eliminates anticoagulation side effects and major bleeding risk.   Procedural risks for the Watchman implant have been reviewed with the patient including a 0.5% risk of stroke, <1% risk of perforation and <1%  risk of device embolization. Other risks include bleeding, vascular damage, tamponade, worsening renal function, and death. The patient understands these risk and wishes to proceed.     The published clinical data on the safety and effectiveness of WATCHMAN include but are not limited to the following: - Holmes DR, Everlene Farrier, Sick P et al. for the PROTECT AF Investigators. Percutaneous closure of the left atrial appendage versus warfarin therapy for prevention of stroke in patients with atrial fibrillation: a randomised non-inferiority trial. Lancet 2009; 374: 534-42. Everlene Farrier, Doshi SK, Isa Rankin D et al. on behalf of the PROTECT AF Investigators. Percutaneous Left Atrial Appendage Closure for Stroke Prophylaxis in Patients With Atrial Fibrillation 2.3-Year Follow-up of the PROTECT AF (Watchman Left Atrial Appendage System for Embolic Protection in Patients With Atrial Fibrillation) Trial. Circulation 2013; 127:720-729. - Alli O, Doshi S,  Kar S, Reddy VY, Sievert H et al. Quality of Life Assessment in the Randomized PROTECT AF (Percutaneous Closure of the Left Atrial Appendage Versus Warfarin Therapy for Prevention of Stroke in Patients With Atrial Fibrillation) Trial of Patients at Risk for Stroke With Nonvalvular Atrial Fibrillation. J Am Coll Cardiol 2013; 61:1790-8. Aline August DR, Mia [place], Price M, Whisenant B, Sievert H, Doshi S, Huber K, Reddy V. Prospective randomized evaluation of the Watchman left atrial appendage Device in patients with atrial fibrillation versus long-term warfarin therapy; the PREVAIL trial. Journal of the [employer] of Cardiology, Vol. 4, No. 1, 2014, 1-11. - Kar S, Doshi SK, Sadhu A, Horton R, Osorio J et al. Primary outcome evaluation of a next-generation left atrial appendage closure device: results from the PINNACLE FLX trial. Circulation 2021;143(18)1754-1762.    After today's visit with the patient which was dedicated solely for shared decision making visit  regarding LAA closure device, the patient decided to proceed with the LAA appendage closure procedure scheduled to be done in the near future at [facility]. Given abnormal renal function, favor TEE on the table the day of the procedure rather than a contrasted CT.     HAS-BLED score 3 Hypertension Yes  Abnormal renal and liver function (Dialysis, transplant, Cr >2.26 mg/dL /Cirrhosis or Bilirubin >2x Normal or AST/ALT/AP >3x Normal) No  Stroke No  Bleeding Yes  Labile INR (Unstable/high INR) No  Elderly (>65) Yes  Drugs or alcohol (? 8 drinks/week, anti-plt or NSAID) No   CHA2DS2-VASc Score = 5  The patient's score is based upon: CHF History: 1 HTN History: 1 Diabetes History: 0 Stroke History: 0 Vascular Disease History: 0 Age Score: 2 Gender Score: 1   I,Mathew Stumpf,acting as a scribe for Lanier Prude, MD.,have documented all relevant documentation on the behalf of Lanier Prude, MD,as directed by  Lanier Prude, MD while in the presence of Lanier Prude, MD.  I, Lanier Prude, MD, have reviewed  all documentation for this visit. The documentation on 02/07/23 for the exam, diagnosis, procedures, and orders are all accurate and complete.   Signed, Rossie Muskrat. Lalla Brothers, MD, [facility], [facility] 02/07/2023 11:46 AM    Electrophysiology [facility]

## 2023-02-07 NOTE — Telephone Encounter (Signed)
Refill complete 

## 2023-02-16 IMAGING — MR MR FEMUR*R* WO/W CM
9 series · 40 of 40 positions shown · IV contrast (gadavist)
Comparison: Ultrasound examinations 10/21/2020 and 01/29/2021

CLINICAL DATA: Evaluate cystic lesion in the medial right thigh.

EXAM:
MRI OF THE RIGHT FEMUR WITHOUT AND WITH CONTRAST
TECHNIQUE: Multiplanar, multisequence MR imaging of the right lower extremity
was performed both before and after administration of intravenous
contrast.
CONTRAST:  10mL GADAVIST GADOBUTROL 1 MMOL/ML IV SOLN

[Series 7: STIR · sagittal · right · 4.0mm · 0.97mm/px · 6 of 42 slices shown (1 of 2)]
[im 1/42]
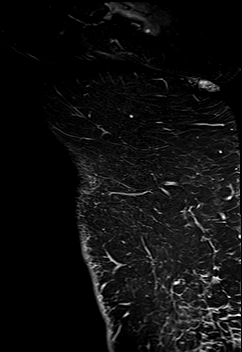
[im 9/42]
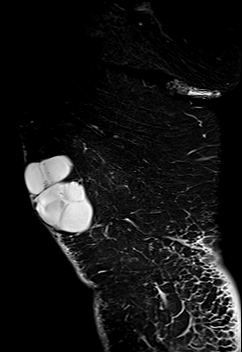
[im 17/42]
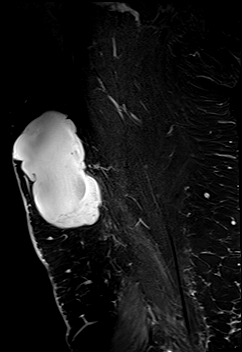
[im 25/42]
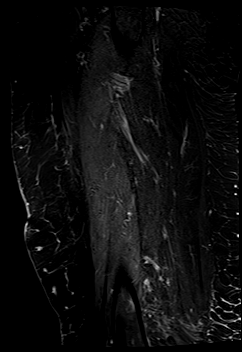
[im 33/42]
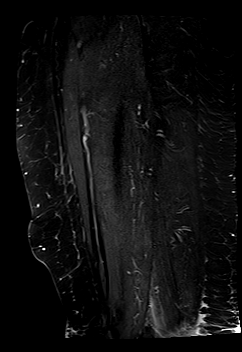
[im 42/42]
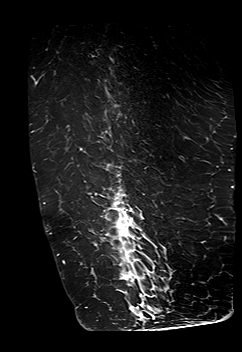

[Series 8: T1 · coronal · right · 4.0mm · 0.80mm/px · 5 of 35 slices shown (1 of 2)]
[im 1/35]
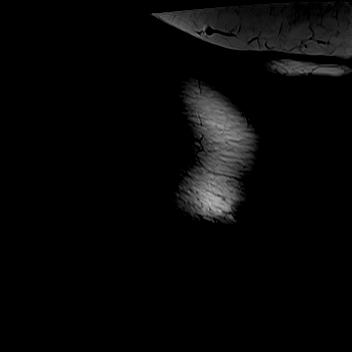
[im 9/35]
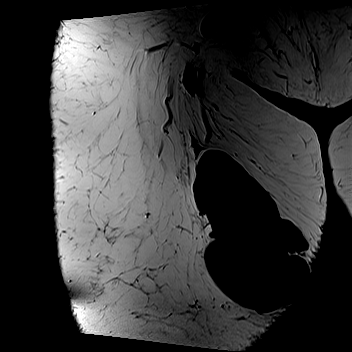
[im 18/35]
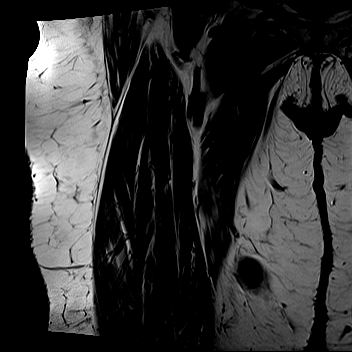
[im 26/35]
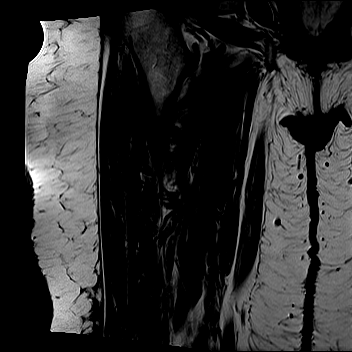
[im 35/35]
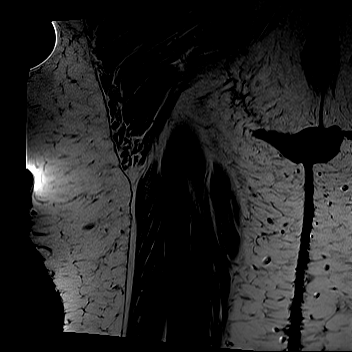

[Series 9: STIR · coronal · right · 4.0mm · 0.80mm/px · 4 of 35 slices shown (2 of 2)]
[im 1/35]
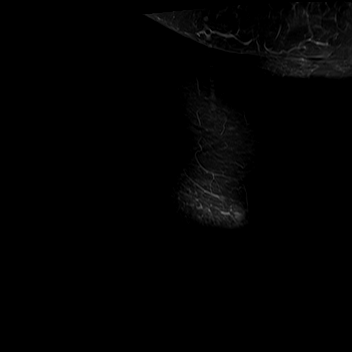
[im 12/35]
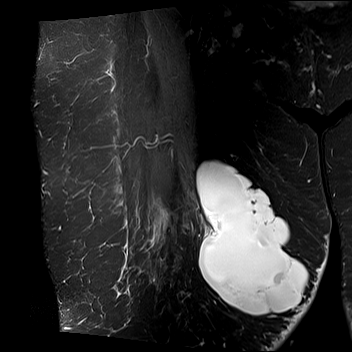
[im 23/35]
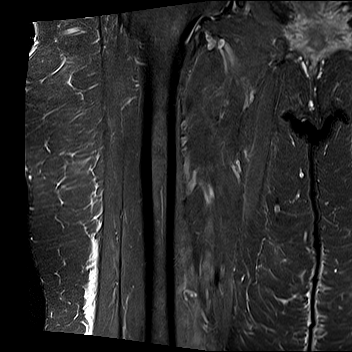
[im 35/35]
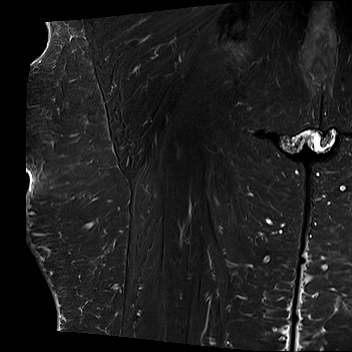

[Series 10: T1 · axial · right · 4.0mm · 1.02mm/px · z∈[+17,+192]mm · 4 of 36 slices shown (2 of 2)]
[im 1/36]
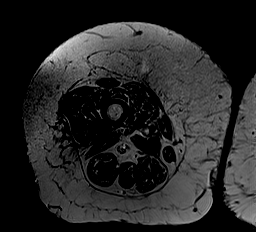
[im 12/36]
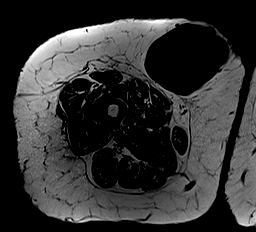
[im 24/36]
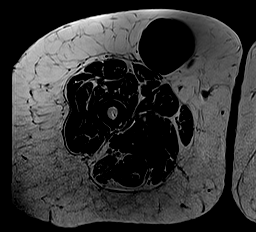
[im 36/36]
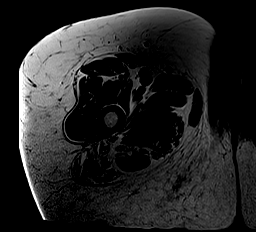

[Series 11: T1 fat-sat · axial · right · 4.0mm · 1.02mm/px · z∈[+17,+192]mm · 4 of 36 slices shown]
[im 1/36]
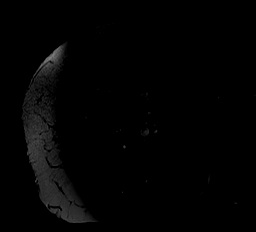
[im 12/36]
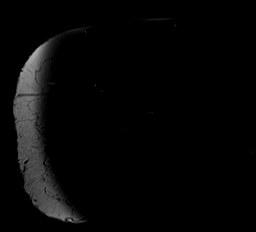
[im 24/36]
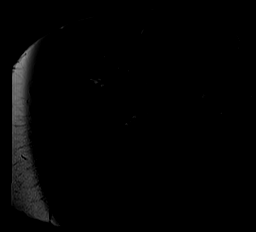
[im 36/36]
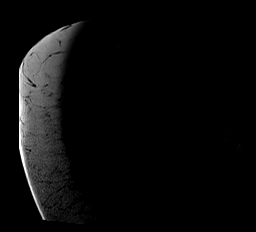

[Series 12: T2 fat-sat · axial · right · 4.0mm · 1.02mm/px · z∈[+17,+192]mm · 4 of 36 slices shown]
[im 1/36]
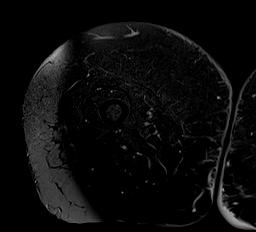
[im 12/36]
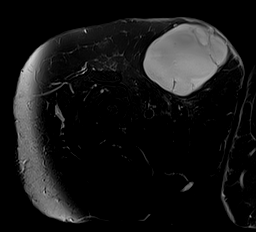
[im 24/36]
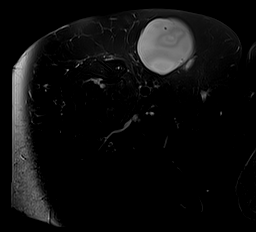
[im 36/36]
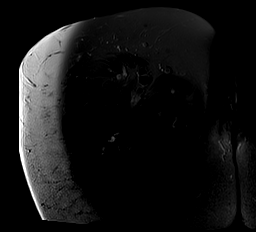

[Series 14: T1 fat-sat post-contrast · sagittal · right · 4.0mm · 0.85mm/px · 5 of 40 slices shown (1 of 3)]
[im 1/40]
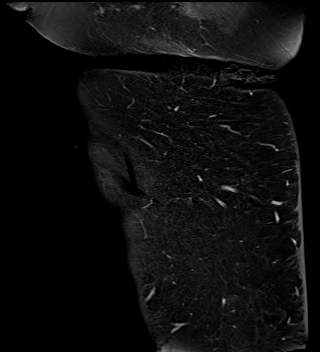
[im 10/40]
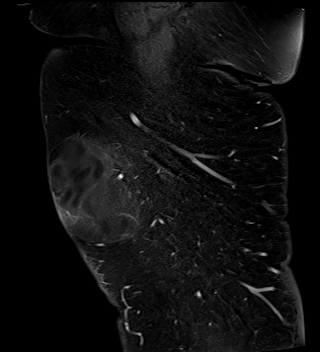
[im 20/40]
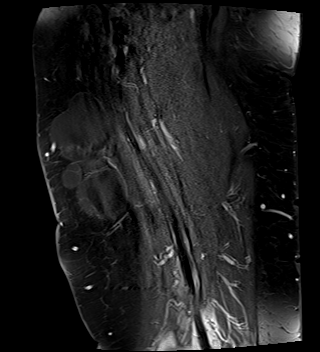
[im 30/40]
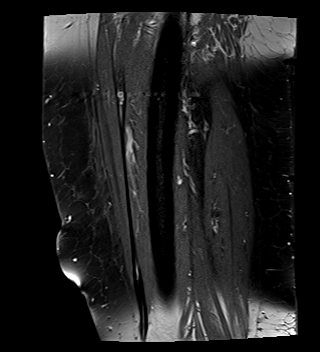
[im 40/40]
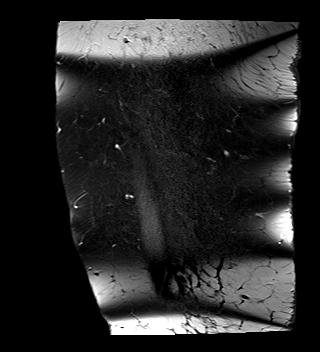

[Series 15: T1 fat-sat post-contrast · coronal · right · 4.0mm · 0.80mm/px · 4 of 35 slices shown (2 of 3)]
[im 1/35]
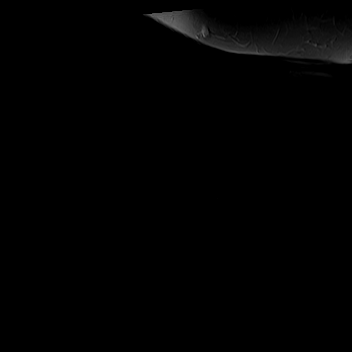
[im 12/35]
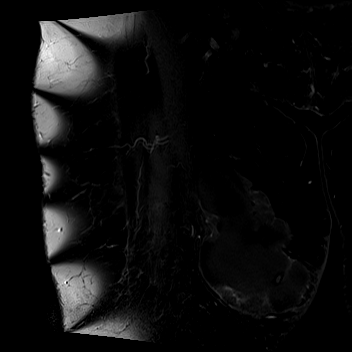
[im 23/35]
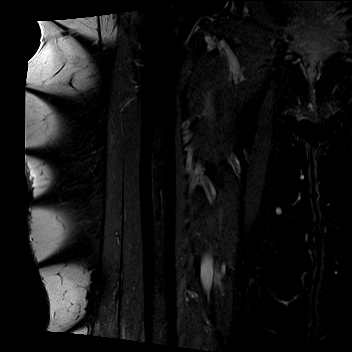
[im 35/35]
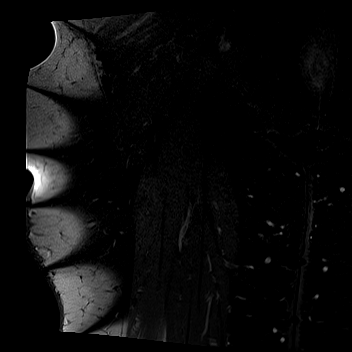

[Series 16: T1 fat-sat post-contrast · axial · right · 4.0mm · 1.02mm/px · z∈[+17,+192]mm · 4 of 36 slices shown (3 of 3)]
[im 1/36]
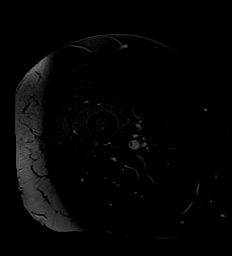
[im 12/36]
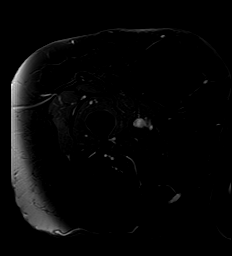
[im 24/36]
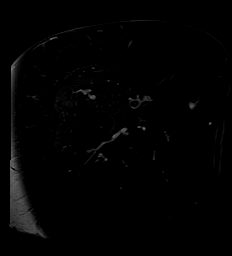
[im 36/36]
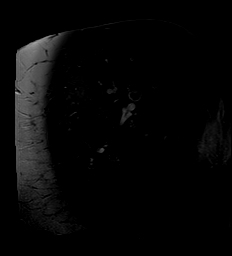

[40 of 40 positions shown; findings below may reference images not displayed]

FINDINGS: There is a there is an 11.4 x 8.6 x 6.6 cm multi septated cystic
lesion in the subcutaneous fat in the medial right thigh as
demonstrated on prior ultrasound examinations. No worrisome solid or
enhancing elements are identified. No involvement of the underlying
musculature. No involvement of the medial neurovascular bundle.

The femur is unremarkable.

The major vascular structures are unremarkable.
IMPRESSION: 11.4 x 8.6 x 6.6 cm multiseptated cystic lesion in the subcutaneous
fat. No worrisome MR imaging features such as nodularity or
enhancement or invasion of adjacent structures. This could be a
liquified hematoma, seroma or possibly an epidermoid inclusion cyst.

## 2023-02-17 ENCOUNTER — Ambulatory Visit (INDEPENDENT_AMBULATORY_CARE_PROVIDER_SITE_OTHER): Payer: Medicare Other

## 2023-02-17 ENCOUNTER — Encounter: Payer: Self-pay | Admitting: Cardiology

## 2023-02-17 ENCOUNTER — Ambulatory Visit: Payer: Medicare Other | Attending: Cardiology | Admitting: Cardiology

## 2023-02-17 ENCOUNTER — Telehealth: Payer: Self-pay | Admitting: Cardiology

## 2023-02-17 VITALS — BP 112/66 | HR 80 | Ht 68.0 in | Wt 216.4 lb

## 2023-02-17 DIAGNOSIS — I10 Essential (primary) hypertension: Secondary | ICD-10-CM | POA: Insufficient documentation

## 2023-02-17 DIAGNOSIS — I5032 Chronic diastolic (congestive) heart failure: Secondary | ICD-10-CM | POA: Insufficient documentation

## 2023-02-17 DIAGNOSIS — Z79899 Other long term (current) drug therapy: Secondary | ICD-10-CM | POA: Diagnosis present

## 2023-02-17 DIAGNOSIS — I272 Pulmonary hypertension, unspecified: Secondary | ICD-10-CM | POA: Diagnosis present

## 2023-02-17 NOTE — Telephone Encounter (Signed)
Checking percert for Echo to be done in [place] office today  02/17/2023

## 2023-02-17 NOTE — Patient Instructions (Addendum)
Medication Instructions:   Continue all current medications.   Labwork:  BMET, Mg - orders given Please do in 3 weeks   Testing/Procedures:  Your physician has requested that you have an echocardiogram. Echocardiography is a painless test that uses sound waves to create images of your heart. It provides your doctor with information about the size and shape of your heart and how well your heart's chambers and valves are working. This procedure takes approximately one hour. There are no restrictions for this procedure. Please do NOT wear cologne, perfume, aftershave, or lotions (deodorant is allowed). Please arrive 15 minutes prior to your appointment time.  Follow-Up:  3 months from today (cancel June visit)   Any Other Special Instructions Will Be Listed Below (If Applicable).   If you need a refill on your cardiac medications before your next appointment, please call your pharmacy.

## 2023-02-17 NOTE — Progress Notes (Signed)
Clinical Summary [name] is a 82 y.o.female seen today for follow up of the following medical problems.    1.Chronic diastolic HF   - 11/2018 echo LVEF 55-60%, indet diastolic fxn 2021-11-07 echo: LVEF 60-65%, noWMAs, indet diastolic, normal RV, mild PASP 41 mmHg, severe LAE, RA pressure 15, severe TR    11/2022 echo: LVEF 55-60%. Indet diastolic fxn. Dshaped septum systole and diastole, mod RV dysfunction      - Cr up 12/2022, we changed torsemide from 40mg  to 20mg  daily and stopped daily potassium. Cr was up to 2.58, K 5.8 - Cr improved on repeat labs to 1.88. Baseline 1.4-1.5 - weight 11/2022 289 lbs during admission with volume overload - home weights 210-213 lbs - no swelling, no SOB/DOE - taking torsemide 20mg  daily.  -orthostatic dizzines has resolved   2.Pulmonary HTN 11/2022 echo: LVEF 55-60%. Indet diastolic fxn. Dshaped septum systole and diastole, mod RV dysfunction. PASP 39. Severe TR - since time of echo down 74 lbs, repeat echo and reassess PA pressure and RV function. Suspect a large part of it was due to elevated left sided pressures      3.HTN - low bp's last visit, we stopped irbesartan - home 100-120s/50s     3. 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  - no symptoms   - from neurosurg note thought that eliquis greater risk than benefit - no recent palpitations -referred to Dr Lalla Brothers for watchman eval - no recent palpitations     4. OSA - not using a cpap, has not been interested in        5. Chronic venous insufficiency - followed by vascular Dr Hart [place] - swelling up and down.  - previously seen in wound clinic.  - wears compression stockings  - has compression socks        6. Large complex cystic mass - noted by [place] medial right thigh, stable from prior US 4 months ago - normal Hgb on check, we have continued anticoag. 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, traumatic head bleed - managed without surgery - still going to physical therapy   SH: her significant other is Renold Genta who is also a patient of mine who passed away 08-Nov-2019 Past Medical History:  Diagnosis Date   Anemia    Anxiety    Arthritis    Back pain    reason unknown   Cataract    bilateral immature   Diarrhea    Diverticulosis    GERD (gastroesophageal reflux disease)    takes Omeprazole daily   Headache(784.0)    occasionally   Hiatal hernia    History of blood transfusion    no abnormal reaction noted   History of bronchitis 2014   History of colon polyps    History of shingles    Hypertension    takes Metoprolol,Amlodipine and Losartan daily   Internal hemorrhoids    Joint pain    Joint swelling    Lung nodule    Nocturia    Numbness    hands occasionally   Osteoarthritis    Peripheral edema    takes HCTZ daily and Furosemide daily as needed   PONV (postoperative nausea and vomiting)    Redness    to both lower extremities   Shortness of breath    WITH EXERTION  Sleep apnea    doesn't use cpap   Urinary frequency    Urinary urgency    Venous insufficiency      Allergies  Allergen Reactions   Sulfa Antibiotics     Reaction unknown   Adhesive [Tape] Other (See Comments) and Rash    Skin is very sensitive Skin is very sensitive Skin is very sensitive     Current Outpatient Medications  Medication Sig Dispense Refill   acetaminophen (TYLENOL) 325 MG tablet Take 1-2 tablets (325-650 mg total) by mouth every 4 (four) hours as needed for mild pain.     albuterol (VENTOLIN HFA) 108 (90 Base) MCG/ACT inhaler Inhale 2 puffs into the lungs every 6 (six) hours as needed for wheezing or shortness of breath. 1 each 6   Biotin 5000 MCG CAPS Take 1 capsule by mouth daily.     Calcium Citrate-Vitamin D 500-12.5 MG-MCG PACK Take by oral route.     ferrous sulfate 325 (65 FE) MG tablet Take 325 mg by mouth 2 (two) times daily  with a meal.     Glucosamine-Chondroitin (GLUCOSAMINE CHONDR COMPLEX PO) Take 1 capsule by mouth daily.     magnesium oxide (MAG-OX) 400 MG tablet Take 1 tablet (400 mg total) by mouth daily. 30 tablet 0   melatonin 3 MG TABS tablet Take 1 tablet (3 mg total) by mouth at bedtime. 30 tablet 0   metoprolol tartrate (LOPRESSOR) 50 MG tablet TAKE 1 TABLET BY MOUTH TWICE A DAY 180 tablet 1   omeprazole (PRILOSEC) 40 MG capsule Take 40 mg by mouth every morning.     spironolactone (ALDACTONE) 25 MG tablet Take 1 tablet (25 mg total) by mouth daily. 90 tablet 3   torsemide (DEMADEX) 20 MG tablet Take 1 tablet (20 mg total) by mouth daily. 90 tablet 3   triamcinolone lotion (KENALOG) 0.1 % Apply 1 application  topically 3 (three) times daily. For legs     vitamin B-12 (CYANOCOBALAMIN) 1000 MCG tablet Take 1,000 mcg by mouth daily.     No current facility-administered medications for this visit.     Past Surgical History:  Procedure Laterality Date   ABDOMINAL HYSTERECTOMY  1983   ANKLE SURGERY Left 1993   ANTERIOR CRUCIATE LIGAMENT REPAIR Left 2013   BREAST SURGERY     LEFT SIDE BIOPSY 1989   CHOLECYSTECTOMY  1990   COLONOSCOPY     ENDOVENOUS ABLATION SAPHENOUS VEIN W/ LASER Right 04/17/2017   endovenous laser ablation R GSV by Josephina Gip MD   ESOPHAGOGASTRODUODENOSCOPY     HAND SURGERY Left 2011   QUADRICEPS TENDON REPAIR  07/16/2012   Procedure: REPAIR QUADRICEP TENDON;  Surgeon: Raymon Mutton, MD;  Location: [facility] OR;  Service: Orthopedics;  Laterality: Left;  open repair of VMO    quiadricp  10'21/2012   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   Procedure: TOTAL KNEE ARTHROPLASTY;  Surgeon: Raymon Mutton, MD;  Location: [facility] OR;  Service: Orthopedics;  Laterality: Left;   TOTAL KNEE ARTHROPLASTY Right 11/04/2013   DR Sherlean Foot   TOTAL KNEE ARTHROPLASTY Right 11/04/2013   Procedure: RIGHT TOTAL KNEE ARTHROPLASTY;  Surgeon: Dannielle Huh, MD;  Location: [facility] OR;  Service: Orthopedics;  Laterality:  Right;   TUBAL LIGATION  1974   vasculitic eruption of legs  1985     Allergies  Allergen Reactions   Sulfa Antibiotics     Reaction unknown   Adhesive [Tape] Other (See Comments) and Rash    Skin is  very sensitive Skin is very sensitive Skin is very sensitive      Family History  Problem Relation Age of Onset   Lymphoma Father    Leukemia Father    Heart attack Father    Hypertension Mother    Emphysema Mother    Heart disease Sister    Rashes / Skin problems Sister    Heart failure Sister    Kidney disease Sister    Heart disease Maternal Grandfather    Heart disease Paternal Grandmother    Heart disease Paternal Grandfather      Social History Ms. Morrical reports that she has never smoked. She has never been exposed to tobacco smoke. She has never used smokeless tobacco. Ms. Atnip reports no history of alcohol use.   Review of Systems CONSTITUTIONAL: No weight loss, fever, chills, weakness or fatigue.  HEENT: Eyes: No visual loss, blurred vision, double vision or yellow sclerae.No hearing loss, sneezing, congestion, runny nose or sore throat.  SKIN: No rash or itching.  CARDIOVASCULAR: per hpi RESPIRATORY: No shortness of breath, cough or sputum.  GASTROINTESTINAL: No anorexia, nausea, vomiting or diarrhea. No abdominal pain or blood.  GENITOURINARY: No burning on urination, no polyuria NEUROLOGICAL: No headache, dizziness, syncope, paralysis, ataxia, numbness or tingling in the extremities. No change in bowel or bladder control.  MUSCULOSKELETAL: No muscle, back pain, joint pain or stiffness.  LYMPHATICS: No enlarged nodes. No history of splenectomy.  PSYCHIATRIC: No history of depression or anxiety.  ENDOCRINOLOGIC: No reports of sweating, cold or heat intolerance. No polyuria or polydipsia.  Marland Kitchen   Physical Examination Today's Vitals   02/17/23 1024  BP: 112/66  Pulse: 80  SpO2: 93%  Weight: 216 lb 6.4 oz (98.2 kg)  Height: 5\' 8"  (1.727 m)   Body mass  index is 32.9 kg/m.  Gen: resting comfortably, no acute distress HEENT: no scleral icterus, pupils equal round and reactive, no palptable cervical adenopathy,  CV: irreg, no m/rg, no jvd Resp: Clear to auscultation bilaterally GI: abdomen is soft, non-tender, non-distended, normal bowel sounds, no hepatosplenomegaly MSK: extremities are warm, no edema.  Skin: warm, no rash Neuro:  no focal deficits Psych: appropriate affect   Diagnostic Studies     Assessment and Plan  1.Chronic diastolic HF - down nearly 80 lbs since March admission with volume overload - recent AKI and orthostatic symptoms, we lowered torsemide to 20mg  daily. Cr improving, orthostasis resolve - continue current meds, repeat bmet/mg  2. Pulmonary HTN/RV dysfunction - suspect large component was elevated left sided pressure. She is down nearly 80lbs since time of her echo, will repeat echo - also obesity, OSA not wanting cpap  3. Afib - no symptoms - off anticoag since recent head bleed - considering watchman, will touch base with Dr Lalla Brothers to see if heard from neurosurg if could tolerate DAPT regimen that would be required  4.HTN - recent low bp's in setting of diuresis, weight loss - ARB was stopped, diuretic dose lowered. - bps improved and are at goal      Antoine Poche, M.D.

## 2023-02-18 LAB — ECHOCARDIOGRAM COMPLETE
AR max vel: 1.92 cm2
AV Area VTI: 1.89 cm2
AV Area mean vel: 1.9 cm2
AV Mean grad: 2.5 mmHg
AV Peak grad: 4.2 mmHg
Ao pk vel: 1.02 m/s
Area-P 1/2: 4.8 cm2
Calc EF: 56.4 %
Height: 68 in
MV M vel: 3.32 m/s
MV Peak grad: 44.1 mmHg
S' Lateral: 3 cm
Single Plane A2C EF: 49.6 %
Single Plane A4C EF: 67.1 %
Weight: 3462.4 oz

## 2023-02-24 ENCOUNTER — Telehealth: Payer: Self-pay

## 2023-02-24 NOTE — Telephone Encounter (Signed)
-----   Message from Frutoso Schatz, RN sent at 02/22/2023  5:29 PM EDT ----- Called patient, she was waiting for Dr. Sueanne Margarita office to call her. They have not reached out. She is going to give them a call tomorrow. I will reach out to his office again as well.  ----- Message ----- From: Lanier Prude, MD Sent: 02/20/2023   9:25 AM EDT To: Antoine Poche, MD; Frutoso Schatz, RN  I have not.  She was also planning to reach out to their office to request an appointment.  Carly, are you able to check in with her to make sure she has contacted their office? Thanks! Sheria Lang ----- Message ----- From: Antoine Poche, MD Sent: 02/17/2023  11:12 AM EDT To: Lanier Prude, MD  Thanks for seeing this patient for watchman eval, she is starting to lean toward procedure. Had you heard anything back from Dr Franky Macho if she would be ok for DAPT?   J [employer]

## 2023-02-24 NOTE — Telephone Encounter (Signed)
Left message with Dr. Sueanne Margarita office

## 2023-02-28 NOTE — Telephone Encounter (Signed)
Spoke with the patient and she has not heard anything back from Dr. Sueanne Margarita office. I will call them again. I have also send them a fax.

## 2023-03-02 ENCOUNTER — Ambulatory Visit: Payer: Medicare Other | Admitting: Cardiology

## 2023-03-02 NOTE — Telephone Encounter (Signed)
Spoke with the patient. She reported that Dr. Sueanne Margarita office called her yesterday and told her that he would clear her without an appointment. Informed her Dr. Sueanne Margarita office will need to send clearance via fax prior to [facility] planning.  She understood she will be called once clearance is recevied.   Attempted to call Dr. Sueanne Margarita office.  Left message to call back with questions, otherwise requested to fax clearance for patient to be on DAPT for Watchman to [facility] fax [phone]).  Dr. Sueanne Margarita info: Phone: [phone] [phone]

## 2023-03-02 NOTE — Telephone Encounter (Signed)
Received fax from Dr. Sueanne Margarita office clearing the patient for Watchman.   Sent back to clarify if the patient is also cleared for DAPT.

## 2023-03-06 ENCOUNTER — Telehealth: Payer: Self-pay | Admitting: *Deleted

## 2023-03-06 ENCOUNTER — Encounter: Payer: Self-pay | Admitting: *Deleted

## 2023-03-06 NOTE — Telephone Encounter (Signed)
Lesle Chris, LPN 5/78/4696  2:95 PM EDT Back to Top    Notified, copy to pcp.  She will wait on the referral until she has time to discuss with her daughter.  Will also send this note to my chart for daughter to read at patient request.  She will let us know what she decides.

## 2023-03-06 NOTE — Telephone Encounter (Signed)
-----   Message from Antoine Poche, MD sent at 02/27/2023 10:08 AM EDT ----- Echo shows right side of the heart remains weakened and some ongoing signs of pulmonary HTN. Id like her to see one of our specialists in pulmonary hypertension and right heart weakness, please refer her to heart failure clinic for pulmonary HTN  J BrancH MD

## 2023-03-08 ENCOUNTER — Telehealth: Payer: Self-pay | Admitting: Physical Medicine and Rehabilitation

## 2023-03-08 NOTE — Telephone Encounter (Signed)
Patient wanted Dr. Shearon Stalls to know how much she appreciates her.  She is doing well, she is able to stay by herself during day and night.  She also has driven.  She also wanted me to tell you that she has been referred to the Watchmen, I didn't know what that was but she said you would know.  She also has been referred to cardiologist.  She was very grateful for what you have done for her.

## 2023-03-09 ENCOUNTER — Other Ambulatory Visit: Payer: Self-pay | Admitting: *Deleted

## 2023-03-09 DIAGNOSIS — I272 Pulmonary hypertension, unspecified: Secondary | ICD-10-CM

## 2023-03-24 ENCOUNTER — Telehealth: Payer: Self-pay | Admitting: Cardiology

## 2023-03-24 NOTE — Telephone Encounter (Signed)
Called Dr. Sueanne Margarita office to follow clearance request to proceed with LAAO and DAPT. Left voice message with Dr. Sueanne Margarita nurse, Carly to return call.   Georgie Chard NP-C Structural Heart Team  Pager: [phone] Phone: [phone]

## 2023-03-27 NOTE — Telephone Encounter (Signed)
See 03/24/2023 phone note for second request and clearance confirmation.

## 2023-04-03 ENCOUNTER — Encounter: Payer: Self-pay | Admitting: *Deleted

## 2023-04-10 ENCOUNTER — Ambulatory Visit: Payer: Medicare Other | Admitting: Physical Medicine and Rehabilitation

## 2023-04-11 ENCOUNTER — Ambulatory Visit (HOSPITAL_COMMUNITY)
Admission: RE | Admit: 2023-04-11 | Discharge: 2023-04-11 | Disposition: A | Discharge status: Home / Self Care | Payer: Medicare Other | Source: Ambulatory Visit | Attending: Cardiology | Admitting: Cardiology

## 2023-04-11 ENCOUNTER — Encounter (HOSPITAL_COMMUNITY): Payer: Self-pay | Admitting: Cardiology

## 2023-04-11 VITALS — BP 120/78 | HR 75 | Wt 216.0 lb

## 2023-04-11 DIAGNOSIS — I4819 Other persistent atrial fibrillation: Secondary | ICD-10-CM | POA: Diagnosis not present

## 2023-04-11 DIAGNOSIS — Z79899 Other long term (current) drug therapy: Secondary | ICD-10-CM | POA: Diagnosis not present

## 2023-04-11 DIAGNOSIS — R600 Localized edema: Secondary | ICD-10-CM | POA: Diagnosis not present

## 2023-04-11 DIAGNOSIS — N1832 Chronic kidney disease, stage 3b: Secondary | ICD-10-CM

## 2023-04-11 DIAGNOSIS — S069X9S Unspecified intracranial injury with loss of consciousness of unspecified duration, sequela: Secondary | ICD-10-CM

## 2023-04-11 DIAGNOSIS — I5032 Chronic diastolic (congestive) heart failure: Secondary | ICD-10-CM | POA: Diagnosis not present

## 2023-04-11 DIAGNOSIS — R296 Repeated falls: Secondary | ICD-10-CM | POA: Diagnosis not present

## 2023-04-11 DIAGNOSIS — N183 Chronic kidney disease, stage 3 unspecified: Secondary | ICD-10-CM | POA: Insufficient documentation

## 2023-04-11 DIAGNOSIS — G4733 Obstructive sleep apnea (adult) (pediatric): Secondary | ICD-10-CM | POA: Diagnosis not present

## 2023-04-11 DIAGNOSIS — I272 Pulmonary hypertension, unspecified: Secondary | ICD-10-CM | POA: Insufficient documentation

## 2023-04-11 DIAGNOSIS — I130 Hypertensive heart and chronic kidney disease with heart failure and stage 1 through stage 4 chronic kidney disease, or unspecified chronic kidney disease: Secondary | ICD-10-CM | POA: Diagnosis not present

## 2023-04-11 LAB — BASIC METABOLIC PANEL
Anion gap: 8 (ref 5–15)
BUN: 37 mg/dL — ABNORMAL HIGH (ref 8–23)
CO2: 29 mmol/L (ref 22–32)
Calcium: 9.9 mg/dL (ref 8.9–10.3)
Chloride: 100 mmol/L (ref 98–111)
Creatinine, Ser: 1.79 mg/dL — ABNORMAL HIGH (ref 0.44–1.00)
GFR, Estimated: 28 mL/min — ABNORMAL LOW (ref >60)
Glucose, Bld: 108 mg/dL — ABNORMAL HIGH (ref 70–99)
Potassium: 4.1 mmol/L (ref 3.5–5.1)
Sodium: 137 mmol/L (ref 135–145)

## 2023-04-11 LAB — BRAIN NATRIURETIC PEPTIDE: B Natriuretic Peptide: 223.9 pg/mL — ABNORMAL HIGH (ref 0.0–100.0)

## 2023-04-11 NOTE — Progress Notes (Signed)
ReDS Vest / Clip - 04/11/23 1500       ReDS Vest / Clip   Station Marker C    Ruler Value 25    ReDS Value Range Moderate volume overload    ReDS Actual Value 36

## 2023-04-11 NOTE — Progress Notes (Addendum)
ADVANCED HEART FAILURE CLINIC NOTE  Referring Physician: Gennie Alma*  Primary Care: Valla Leaver, MD Primary Cardiologist:  HPI: [name] is a 82 y.o. 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  Since her discharge from [facility] in March 2023 she has lost roughly 60 to 70 pounds.  She started taking torsemide 40 mg daily regularly and made significant dietary changes.  In addition she checks her blood pressure and weight daily with a goal weight around 210 to 212 pounds.  Over the past week she has weighed roughly to 12 to 14 pounds and has had significant improvement in dyspnea and lower extremity edema.  Activity level/exercise tolerance: NYHA IIb-III Orthopnea:  Sleeps on 2 pillows Paroxysmal noctural dyspnea: No Chest pain/pressure:  no Orthostatic lightheadedness:  no Palpitations:  no Lower extremity edema:  yes, 2+ chronic nonpitting Presyncope/syncope:  no Cough:  no  Past Medical History:  Diagnosis Date   Anemia    Anxiety    Arthritis    Back pain    reason unknown   Cataract    bilateral immature   Diarrhea    Diverticulosis    GERD (gastroesophageal reflux disease)    takes Omeprazole daily   Headache(784.0)    occasionally   Hiatal hernia    History of blood transfusion    no abnormal reaction noted   History of bronchitis 2014   History of colon polyps    History of shingles    Hypertension    takes Metoprolol,Amlodipine and Losartan daily   Internal hemorrhoids    Joint pain    Joint swelling    Lung nodule    Nocturia    Numbness    hands occasionally   Osteoarthritis    Peripheral edema    takes HCTZ daily and Furosemide daily as needed   PONV  (postoperative nausea and vomiting)    Redness    to both lower extremities   Shortness of breath    WITH EXERTION    Sleep apnea    doesn't use cpap   Urinary frequency    Urinary urgency    Venous insufficiency     Current Outpatient Medications  Medication Sig Dispense Refill   acetaminophen (TYLENOL) 325 MG tablet Take 1-2 tablets (325-650 mg total) by mouth every 4 (four) hours as needed for mild pain.     albuterol (VENTOLIN HFA) 108 (90 Base) MCG/ACT inhaler Inhale 2 puffs into the lungs every 6 (six) hours as needed for wheezing or shortness of breath. 1 each 6   Biotin 5000 MCG CAPS Take 1 capsule by mouth daily.     Calcium Citrate-Vitamin D 500-12.5 MG-MCG PACK Take by oral route.     ferrous sulfate 325 (65 FE) MG tablet Take 325 mg by mouth 2 (two) times daily with a meal.     Glucosamine-Chondroitin (GLUCOSAMINE CHONDR COMPLEX PO) Take 1 capsule by mouth daily.     magnesium oxide (MAG-OX) 400 MG tablet Take 1 tablet (400 mg total) by mouth daily. 30 tablet 0   melatonin 3 MG TABS tablet Take 1 tablet (3 mg total) by mouth at bedtime. 30 tablet 0   metoprolol tartrate (LOPRESSOR) 50 MG tablet TAKE 1 TABLET BY MOUTH TWICE A DAY 180 tablet 1   omeprazole (PRILOSEC) 40  MG capsule Take 40 mg by mouth every morning.     spironolactone (ALDACTONE) 25 MG tablet Take 1 tablet (25 mg total) by mouth daily. 90 tablet 3   torsemide (DEMADEX) 20 MG tablet Take 1 tablet (20 mg total) by mouth daily. 90 tablet 3   triamcinolone lotion (KENALOG) 0.1 % Apply 1 application  topically 3 (three) times daily. For legs     vitamin B-12 (CYANOCOBALAMIN) 1000 MCG tablet Take 1,000 mcg by mouth daily.     No current facility-administered medications for this encounter.    Allergies  Allergen Reactions   Sulfa Antibiotics     Reaction unknown   Adhesive [Tape] Other (See Comments) and Rash    Skin is very sensitive Skin is very sensitive Skin is very sensitive      Social History    Socioeconomic History   Marital status: Single    Spouse name: Not on file   Number of children: 3   Years of education: 14   Highest education level: Not on file  Occupational History   Occupation: retired  Tobacco Use   Smoking status: Never    Passive exposure: Never   Smokeless tobacco: Never  Vaping Use   Vaping status: Never Used  Substance and Sexual Activity   Alcohol use: No    Alcohol/week: 0.0 standard drinks of alcohol   Drug use: No   Sexual activity: Never    Birth control/protection: Surgical  Other Topics Concern   Not on file  Social History Narrative   Fun: Exercise class twice per week.    Denies abuse and feels safe at home.    Social Determinants of Health   Financial Resource Strain: Low Risk  (12/15/2022)   Received from [facility], [facility]   Overall Financial Resource Strain (CARDIA)    Difficulty of Paying Living Expenses: Not hard at all  Food Insecurity: No Food Insecurity (12/15/2022)   Received from [facility], [facility]   Hunger Vital Sign    Worried About Running Out of Food in the Last Year: Never true    Ran Out of Food in the Last Year: Never true  Transportation Needs: No Transportation Needs (12/15/2022)   Received from [facility], [facility]   [facility] - Transportation    Lack of Transportation (Medical): No    Lack of Transportation (Non-Medical): No  Physical Activity: Inactive (12/15/2022)   Received from [facility], [facility]   Exercise Vital Sign    Days of Exercise per Week: 0 days    Minutes of Exercise per Session: 0 min  Stress: No Stress Concern Present (12/15/2022)   Received from [facility], [facility] of Occupational Health - Occupational Stress Questionnaire    Feeling of Stress : Only a little  Social Connections: Moderately Integrated (12/15/2022)   Received from [facility], [facility]   Social Connection and Isolation Panel  [NHANES]    Frequency of Communication with Friends and Family: More than three times a week    Frequency of Social Gatherings with Friends and Family: Never    Attends Religious Services: More than 4 times per year    Active Member of [employer] or Organizations: Yes    Attends Banker Meetings: More than 4 times per year    Marital Status: Divorced  Intimate Partner Violence: Not At Risk (12/15/2022)   Received from [facility], [employer], Afraid, Rape, and Kick questionnaire  Fear of Current or Ex-Partner: No    Emotionally Abused: No    Physically Abused: No    Sexually Abused: No      Family History  Problem Relation Age of Onset   Lymphoma Father    Leukemia Father    Heart attack Father    Hypertension Mother    Emphysema Mother    Heart disease Sister    Rashes / Skin problems Sister    Heart failure Sister    Kidney disease Sister    Heart disease Maternal Grandfather    Heart disease Paternal Grandmother    Heart disease Paternal Grandfather     PHYSICAL EXAM: Vitals:   04/11/23 1442  BP: 120/78  Pulse: 75  SpO2: 99%   GENERAL: Well nourished, well developed, and in no apparent distress at rest.  HEENT: Negative for arcus senilis or xanthelasma. There is no scleral icterus.  The mucous membranes are pink and moist.   NECK: Supple, No masses. Normal carotid upstrokes without bruits. No masses or thyromegaly.    CHEST: There are no chest wall deformities. There is no chest wall tenderness. Respirations are unlabored.  Lungs- CTA B/L CARDIAC:  JVP: 7 cm H2O         Normal S1, S2  Normal rate with regular rhythm. No murmurs, rubs or gallops.  Pulses are 2+ and symmetrical in upper and lower extremities. 1-2+ nonpitting edema.  ABDOMEN: Soft, non-tender, non-distended. There are no masses or hepatomegaly. There are normal bowel sounds.  EXTREMITIES: Warm and well perfused with no cyanosis, clubbing. Chronic venous  stasis LYMPHATIC: No axillary or supraclavicular lymphadenopathy.  NEUROLOGIC: Patient is oriented x3 with no focal or lateralizing neurologic deficits.  PSYCH: Patients affect is appropriate, there is no evidence of anxiety or depression.  SKIN: Warm and dry; no lesions or wounds.   DATA REVIEW  ECG: 11/28/22: atrial fibrillation  As per my personal interpretation  ECHO: 02/17/23: LVEF 60-65%, moderately dilated RV with mildly reduced function, mod-severe TR as per my personal interpretation   ASSESSMENT & PLAN:  Heart failure with preserved EF - hold off on SGLT2i due to UTI symptoms while inpatient.  - continue spironolactone 25mg  daily - Continue torsemide 20mg -40mg  daily; we discussed titration based on weights at length.  - REDS 36% today.  2. Atrial fibrillation -following with Dr. Lalla Brothers with plans for Watchman.  -previously turned down DCCV.   3. Evaluation for [facility] - Reviewed echocardiogram, CT imaging of the chest from 2019 - seen by pulmonology in 2023; was started on Symbicort with improvement in dyspnea which she later self-discontinued.  - At this time will hold off on RHC. She has had significant weight loss and improvement in functional status. 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 At this time will continue management of HFpEF with diuretics. I also advised her to have another sleep study. RHC unlikely to change management.   4. OSA  - Currently not using a CPAP - Watchpat.   5. SDH/SAH - Admitted 3/24 after multiple falls leading to traumatic head bleed managed conservatively.   6. CKD III - sCr 1.4-1.5 at baseline.  - repeat labs today.    Saphyre Cillo Advanced Heart Failure Mechanical Circulatory Support

## 2023-04-11 NOTE — Patient Instructions (Signed)
There have been no changes to your medications.  Labs done today, your results will be available in MyChart, we will contact you for abnormal readings.  Your physician has requested that you have an echocardiogram. Echocardiography is a painless test that uses sound waves to create images of your heart. It provides your doctor with information about the size and shape of your heart and how well your heart's chambers and valves are working. This procedure takes approximately one hour. There are no restrictions for this procedure. Please do NOT wear cologne, perfume, aftershave, or lotions (deodorant is allowed). Please arrive 15 minutes prior to your appointment time.  Your physician recommends that you schedule a follow-up appointment in: 3 months with an echocardiogram (October) **PLEASE CALL THE OFFICE IN MID AUGUST TO ARRANGE YOUR FOLLOW UP APPOINTMENT. **   If you have any questions or concerns before your next appointment please send [place] a message through [place] or call our office at [phone].    TO LEAVE A MESSAGE FOR THE NURSE SELECT OPTION 2, PLEASE LEAVE A MESSAGE INCLUDING: YOUR NAME DATE OF BIRTH CALL BACK NUMBER REASON FOR CALL**this is important as we prioritize the call backs  YOU WILL RECEIVE A CALL BACK THE SAME DAY AS LONG AS YOU CALL BEFORE 4:00 PM  At the Advanced [facility] Failure Clinic, you and your health needs are our priority. As part of our continuing mission to provide you with exceptional heart care, we have created designated Provider Care Teams. These Care Teams include your primary Cardiologist (physician) and Advanced Practice Providers (APPs- Physician Assistants and Nurse Practitioners) who all work together to provide you with the care you need, when you need it.   You may see any of the following providers on your designated Care Team at your next follow up: Dr Arvilla Meres Dr Marca Ancona Dr. Marcos Eke, NP Robbie Lis,  [place] [facility] [place], [place] Brynda Peon, NP Karle Plumber, PharmD   Please be sure to bring in all your medications bottles to every appointment.    Thank you for choosing [facility]

## 2023-04-12 ENCOUNTER — Telehealth: Payer: Self-pay

## 2023-04-12 ENCOUNTER — Other Ambulatory Visit: Payer: Self-pay

## 2023-04-12 DIAGNOSIS — S069X9S Unspecified intracranial injury with loss of consciousness of unspecified duration, sequela: Secondary | ICD-10-CM

## 2023-04-12 DIAGNOSIS — I4819 Other persistent atrial fibrillation: Secondary | ICD-10-CM

## 2023-04-12 NOTE — Telephone Encounter (Signed)
The patient had previously spoken with Georgie Chard and requested to be "penciled in" for LAAO with Dr. Lalla Brothers 06/22/2023 but she wanted to speak with Dr. Gasper Lloyd first.  She discussed with him yesterday and she said together they decided it was a good idea to proceed. Confirmed previously scheduled pre-procedure visit with Carlean Jews 06/14/2023.  At pre-procedure visit she will be given instructions to start  Plavix 75 mg daily per Dr. Lalla Brothers (and cleared by Neurosurgery).  She was grateful for follow-up and agreed with plan.

## 2023-04-14 ENCOUNTER — Telehealth (HOSPITAL_COMMUNITY): Payer: Self-pay | Admitting: *Deleted

## 2023-04-14 NOTE — Telephone Encounter (Signed)
Called patient per Dr. Gasper Lloyd with following lab results and instructions:  "Lets have [name] reduce her current torsemide dose by half for the next 2-3 days due to a rise in her sCr. I think targeting a weight of 210lbs is too low for her currently."  Pt verbalized understanding of same.

## 2023-04-17 ENCOUNTER — Telehealth: Payer: Self-pay

## 2023-05-18 ENCOUNTER — Ambulatory Visit: Payer: Medicare Other | Admitting: Cardiology

## 2023-06-02 ENCOUNTER — Telehealth: Payer: Self-pay | Admitting: Cardiology

## 2023-06-02 NOTE — Telephone Encounter (Signed)
Will forward for scheduling

## 2023-06-02 NOTE — Telephone Encounter (Signed)
Patient states when her appointment was cancelled by the provider she was advised that she would be called back to reschedule. Patient is very upset because she never received a call back and Dr. Wyline Mood does not have any openings until January, 2025. She states she does not want to see a PA and must speak with someone to reschedule sooner. She states she was told she needs to be seen prior to her watchman procedure, which Dr. Wyline Mood recommended she have. She states she understands that Dr. Wyline Mood had to be out, but it was misleading to say that someone would call her back to reschedule. Patient is very upset and requests a call back soon.

## 2023-06-03 ENCOUNTER — Encounter: Payer: Self-pay | Admitting: Cardiology

## 2023-06-08 NOTE — Telephone Encounter (Signed)
Appointment has been scheduled.

## 2023-06-12 ENCOUNTER — Encounter: Payer: Self-pay | Admitting: Cardiology

## 2023-06-12 ENCOUNTER — Ambulatory Visit: Payer: Medicare Other | Attending: Cardiology | Admitting: Cardiology

## 2023-06-12 VITALS — BP 118/72 | HR 78 | Ht 68.0 in | Wt 216.4 lb

## 2023-06-12 DIAGNOSIS — I4819 Other persistent atrial fibrillation: Secondary | ICD-10-CM | POA: Diagnosis present

## 2023-06-12 DIAGNOSIS — I272 Pulmonary hypertension, unspecified: Secondary | ICD-10-CM | POA: Diagnosis present

## 2023-06-12 DIAGNOSIS — I5032 Chronic diastolic (congestive) heart failure: Secondary | ICD-10-CM | POA: Insufficient documentation

## 2023-06-12 DIAGNOSIS — I10 Essential (primary) hypertension: Secondary | ICD-10-CM | POA: Insufficient documentation

## 2023-06-12 NOTE — Patient Instructions (Addendum)
Medication Instructions:  Continue all current medications.  Labwork: none  Testing/Procedures: none  Follow-Up: 6 months   Any Other Special Instructions Will Be Listed Below (If Applicable).  If you need a refill on your cardiac medications before your next appointment, please call your pharmacy.  

## 2023-06-12 NOTE — Progress Notes (Signed)
Clinical Summary Ms. Ballog is a 82 y.o.female seen today for follow up of the following medical problems.    1.Chronic diastolic HF    - 04/2955 echo LVEF 55-60%, indet diastolic fxn 12-01-2021 echo: LVEF 60-65%, noWMAs, indet diastolic, normal RV, mild PASP 41 mmHg, severe LAE, RA pressure 15, severe TR    11/2022 echo: LVEF 55-60%. Indet diastolic fxn. Dshaped septum systole and diastole, mod RV dysfunction    01/2023 echo: LVEF 60-65%, indet diastolic function, RV volume overload, mod RV dysfunction, mod pulm HTN, mod BAE, severe TR   - Cr up 12/2022, we changed torsemide from 40mg  to 20mg  daily and stopped daily potassium. Cr was up to 2.58, K 5.8 - Cr improved on repeat labs to 1.88. Baseline 1.4-1.5   - no SOB/DOE, no recent LE edema - taking torsemide 20mg  daily. Has labs ordered with pcp   2.Pulmonary HTN 11/2022 echo: LVEF 55-60%. Indet diastolic fxn. Dshaped septum systole and diastole, mod RV dysfunction. PASP 39. Severe TR - since time of echo down 74 lbs, repeat echo and reassess PA pressure and RV function. Suspect a large part of it was due to elevated left sided pressures      01/2023 echo: LVEF 60-65%, indet diastolic function, RV volume overload, mod RV dysfunction, mod pulm HTN, mod BAE, severe TR  - seen by HF clinic, recs to continue managing her diastolic and OSA. RHC at this time unlikely to change management   3.HTN - low bp's after her significant weight losss, we stopped irbesartan - home 100-110s/60s     4. 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 OSA - not using a cpap, has not been interested in - discussed again, does not want to readdress at this time.       5. Chronic venous  insufficiency - followed by vascular Dr Hart Rochester - swelling up and down.  - previously seen in wound clinic.  - wears compression stockings  - has compression socks        6. Large complex cystic mass - noted by [place] medial right thigh, stable from prior US 4 months ago - normal Hgb on check, we have continued anticoag. 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, traumatic head bleed - managed without surgery - still going to physical therapy   SH: her significant other is Renold Genta who is also a patient of mine who passed away December 02, 2019 Past Medical History:  Diagnosis Date   Anemia    Anxiety    Arthritis    Back pain    reason unknown   Cataract    bilateral immature   Diarrhea    Diverticulosis    GERD (gastroesophageal reflux disease)    takes Omeprazole daily   Headache(784.0)    occasionally   Hiatal hernia    History of blood transfusion    no abnormal reaction noted   History of bronchitis 2014   History of colon polyps    History of shingles    Hypertension    takes Metoprolol,Amlodipine and Losartan daily   Internal hemorrhoids  Joint pain    Joint swelling    Lung nodule    Nocturia    Numbness    hands occasionally   Osteoarthritis    Peripheral edema    takes HCTZ daily and Furosemide daily as needed   PONV (postoperative nausea and vomiting)    Redness    to both lower extremities   Shortness of breath    WITH EXERTION    Sleep apnea    doesn't use cpap   Urinary frequency    Urinary urgency    Venous insufficiency      Allergies  Allergen Reactions   Sulfa Antibiotics     Reaction unknown   Adhesive [Tape] Other (See Comments) and Rash    Skin is very sensitive Skin is very sensitive Skin is very sensitive     Current Outpatient Medications  Medication Sig Dispense Refill   acetaminophen (TYLENOL) 325 MG tablet Take 1-2 tablets (325-650 mg total) by mouth every 4  (four) hours as needed for mild pain.     albuterol (VENTOLIN HFA) 108 (90 Base) MCG/ACT inhaler Inhale 2 puffs into the lungs every 6 (six) hours as needed for wheezing or shortness of breath. 1 each 6   Biotin 5000 MCG CAPS Take 1 capsule by mouth daily.     Calcium Citrate-Vitamin D 500-12.5 MG-MCG PACK Take by oral route.     ferrous sulfate 325 (65 FE) MG tablet Take 325 mg by mouth 2 (two) times daily with a meal.     Glucosamine-Chondroitin (GLUCOSAMINE CHONDR COMPLEX PO) Take 1 capsule by mouth daily.     magnesium oxide (MAG-OX) 400 MG tablet Take 1 tablet (400 mg total) by mouth daily. 30 tablet 0   melatonin 3 MG TABS tablet Take 1 tablet (3 mg total) by mouth at bedtime. 30 tablet 0   metoprolol tartrate (LOPRESSOR) 50 MG tablet TAKE 1 TABLET BY MOUTH TWICE A DAY 180 tablet 1   omeprazole (PRILOSEC) 40 MG capsule Take 40 mg by mouth every morning.     spironolactone (ALDACTONE) 25 MG tablet Take 1 tablet (25 mg total) by mouth daily. 90 tablet 3   torsemide (DEMADEX) 20 MG tablet Take 1 tablet (20 mg total) by mouth daily. 90 tablet 3   triamcinolone lotion (KENALOG) 0.1 % Apply 1 application  topically 3 (three) times daily. For legs     vitamin B-12 (CYANOCOBALAMIN) 1000 MCG tablet Take 1,000 mcg by mouth daily.     No current facility-administered medications for this visit.     Past Surgical History:  Procedure Laterality Date   ABDOMINAL HYSTERECTOMY  1983   ANKLE SURGERY Left 1993   ANTERIOR CRUCIATE LIGAMENT REPAIR Left 2013   BREAST SURGERY     LEFT SIDE BIOPSY 1989   CHOLECYSTECTOMY  1990   COLONOSCOPY     ENDOVENOUS ABLATION SAPHENOUS VEIN W/ LASER Right 04/17/2017   endovenous laser ablation R GSV by Josephina Gip MD   ESOPHAGOGASTRODUODENOSCOPY     HAND SURGERY Left 2011   QUADRICEPS TENDON REPAIR  07/16/2012   Procedure: REPAIR QUADRICEP TENDON;  Surgeon: Raymon Mutton, MD;  Location: [facility];  Service: Orthopedics;  Laterality: Left;  open repair of VMO     quiadricp  10'21/2012   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   Procedure: TOTAL KNEE ARTHROPLASTY;  Surgeon: Raymon Mutton, MD;  Location: [facility] OR;  Service: Orthopedics;  Laterality: Left;   TOTAL KNEE ARTHROPLASTY Right 11/04/2013   DR Sherlean Foot  TOTAL KNEE ARTHROPLASTY Right 11/04/2013   Procedure: RIGHT TOTAL KNEE ARTHROPLASTY;  Surgeon: Dannielle Huh, MD;  Location: [facility];  Service: Orthopedics;  Laterality: Right;   TUBAL LIGATION  1974   vasculitic eruption of legs  1985     Allergies  Allergen Reactions   Sulfa Antibiotics     Reaction unknown   Adhesive [Tape] Other (See Comments) and Rash    Skin is very sensitive Skin is very sensitive Skin is very sensitive      Family History  Problem Relation Age of Onset   Lymphoma Father    Leukemia Father    Heart attack Father    Hypertension Mother    Emphysema Mother    Heart disease Sister    Rashes / Skin problems Sister    Heart failure Sister    Kidney disease Sister    Heart disease Maternal Grandfather    Heart disease Paternal Grandmother    Heart disease Paternal Grandfather      Social History Ms. Sarrazin reports that she has never smoked. She has never been exposed to tobacco smoke. She has never used smokeless tobacco. Ms. Dees reports no history of alcohol use.   Review of Systems CONSTITUTIONAL: No weight loss, fever, chills, weakness or fatigue.  HEENT: Eyes: No visual loss, blurred vision, double vision or yellow sclerae.No hearing loss, sneezing, congestion, runny nose or sore throat.  SKIN: No rash or itching.  CARDIOVASCULAR: per hpi RESPIRATORY: No shortness of breath, cough or sputum.  GASTROINTESTINAL: No anorexia, nausea, vomiting or diarrhea. No abdominal pain or blood.  GENITOURINARY: No burning on urination, no polyuria NEUROLOGICAL: No headache, dizziness, syncope, paralysis, ataxia, numbness or tingling in the extremities. No change in bowel or bladder control.  MUSCULOSKELETAL: No muscle,  back pain, joint pain or stiffness.  LYMPHATICS: No enlarged nodes. No history of splenectomy.  PSYCHIATRIC: No history of depression or anxiety.  ENDOCRINOLOGIC: No reports of sweating, cold or heat intolerance. No polyuria or polydipsia.  Marland Kitchen   Physical Examination Today's Vitals   06/12/23 1307  BP: 118/72  Pulse: 78  SpO2: 99%  Weight: 216 lb 6.4 oz (98.2 kg)  Height: 5\' 8"  (1.727 m)   Body mass index is 32.9 kg/m.  Gen: resting comfortably, no acute distress HEENT: no scleral icterus, pupils equal round and reactive, no palptable cervical adenopathy,  CV" irreg, no m/r,g no jvd Resp: Clear to auscultation bilaterally GI: abdomen is soft, non-tender, non-distended, normal bowel sounds, no hepatosplenomegaly MSK: extremities are warm, no edema.  Skin: warm, no rash Neuro:  no focal deficits Psych: appropriate affect   Diagnostic Studies  01/2023 echo 1. Left ventricular ejection fraction, by estimation, is 60 to 65%. The  left ventricle has normal function. The left ventricle has no regional  wall motion abnormalities. Left ventricular diastolic parameters are  indeterminate.   2. Ventricular septal flattening in diastole suggesting RV volume  overload. . Right ventricular systolic function is moderately reduced. The  right ventricular size is severely enlarged. There is moderately elevated  pulmonary artery systolic pressure.   3. Left atrial size was moderately dilated.   4. Right atrial size was moderately dilated.   5. The mitral valve is abnormal. Mild mitral valve regurgitation. No  evidence of mitral stenosis.   6. The tricuspid valve is abnormal. Tricuspid valve regurgitation is  severe.   7. The aortic valve is tricuspid. Aortic valve regurgitation is not  visualized. No aortic stenosis is present.   8.  The inferior vena cava is dilated in size with <50% respiratory  variability, suggesting right atrial pressure of 15 mmHg.    Assessment and Plan    1.Chronic diastolic HF - down nearly 80 lbs since March admission with volume overload - weights are stable now, we cut her torsemide back to 20mg  daily which has done well as a maintenance dose. - upcoming labs with pcp, f/u results. -prior UTI symptoms, holding on SGLT2i   2. Pulmonary HTN/RV dysfunction - suspect large component was elevated left sided pressure, OSA. - seen by HF clinic, plans to contniue current management at this time. RHC at this time would not affect management.    3. Afib - no symptoms - off anticoag since prior  head bleed - plans for watchman later this month   4.HTN - recent low bp's in setting of diuresis, weight loss - ARB was stopped, diuretic dose lowered -bp's are at goal,continue current meds       Antoine Poche, M.D.

## 2023-06-13 NOTE — Progress Notes (Unsigned)
[facility]   MULTIDISCIPLINARY HEART VALVE CLINIC                                     Cardiology Office Note:    Date:  06/14/2023   ID:  [name], DOB [birth year], MRN [id number]  PCP:  Valla Leaver, MD  [facility] Cardiologist:  Dina Rich, MD  [facility] Electrophysiologist:  Lanier Prude, MD   Referring MD: Gennie Alma*   Pre Watchman visit  History of Present Illness:    [name] is a 82 y.o. female with a hx of persistent AF, HTN, OSA, lymphedema, TBI, SAH/SDH s/p fall, CKD stage IIIb, pulm HTN, severe TR and chronic diastolic CHF who presents to clinic for follow up.   She was referred for LAAO with Watchman given persistent atrial fibrillation with history of epistaxis and traumatic SAH/SDH on Eliquis. Given need for DAPT after the procedure it was cleared by her neurosurgeon, Dr. Franky Macho. Given abnormal renal function, plan for TEE on the table the day of the procedure rather than a contrasted CT.  Today the patient presents to clinic for follow up. Here with her son. Doing well. Had a recent nosebleed in August. No CP or SOB. Chronic LE edema with blistering L>R which has been stable No orthopnea or PND. No dizziness or syncope. No blood in stool or urine. No palpitations. Exercises regularly with no limitation.   Past Medical History:  Diagnosis Date   Anemia    Anxiety    Arthritis    Back pain    reason unknown   Cataract    bilateral immature   Diarrhea    Diverticulosis    GERD (gastroesophageal reflux disease)    takes Omeprazole daily   Headache(784.0)    occasionally   Hiatal hernia    History of blood transfusion    no abnormal reaction noted   History of bronchitis 2014   History of colon polyps    History of shingles    Hypertension    takes Metoprolol,Amlodipine and Losartan daily   Internal hemorrhoids    Joint pain    Joint swelling    Lung nodule    Nocturia    Numbness     hands occasionally   Osteoarthritis    Peripheral edema    takes HCTZ daily and Furosemide daily as needed   PONV (postoperative nausea and vomiting)    Redness    to both lower extremities   Shortness of breath    WITH EXERTION    Sleep apnea    doesn't use cpap   Urinary frequency    Urinary urgency    Venous insufficiency     Past Surgical History:  Procedure Laterality Date   ABDOMINAL HYSTERECTOMY  1983   ANKLE SURGERY Left 1993   ANTERIOR CRUCIATE LIGAMENT REPAIR Left 2013   BREAST SURGERY     LEFT SIDE BIOPSY 1989   CHOLECYSTECTOMY  1990   COLONOSCOPY     ENDOVENOUS ABLATION SAPHENOUS VEIN W/ LASER Right 04/17/2017   endovenous laser ablation R GSV by Josephina Gip MD   ESOPHAGOGASTRODUODENOSCOPY     HAND SURGERY Left 2011   QUADRICEPS TENDON REPAIR  07/16/2012   Procedure: REPAIR QUADRICEP TENDON;  Surgeon: Raymon Mutton, MD;  Location: [facility];  Service: Orthopedics;  Laterality: Left;  open repair of VMO    quiadricp  10'21/2012   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   Procedure: TOTAL KNEE ARTHROPLASTY;  Surgeon: Raymon Mutton, MD;  Location: [facility];  Service: Orthopedics;  Laterality: Left;   TOTAL KNEE ARTHROPLASTY Right 11/04/2013   DR Sherlean Foot   TOTAL KNEE ARTHROPLASTY Right 11/04/2013   Procedure: RIGHT TOTAL KNEE ARTHROPLASTY;  Surgeon: Dannielle Huh, MD;  Location: [facility];  Service: Orthopedics;  Laterality: Right;   TUBAL LIGATION  1974   vasculitic eruption of legs  1985    Current Medications: Current Meds  Medication Sig   acetaminophen (TYLENOL) 325 MG tablet Take 1-2 tablets (325-650 mg total) by mouth every 4 (four) hours as needed for mild pain.   Biotin 5000 MCG CAPS Take 5,000 mcg by mouth daily.   Calcium Citrate-Vitamin D 500-12.5 MG-MCG PACK Take 1 tablet by mouth daily.   clopidogrel (PLAVIX) 75 MG tablet Take 1 tablet (75 mg total) by mouth daily.   ferrous sulfate 325 (65 FE) MG tablet Take 325 mg by mouth 2 (two) times daily with a meal.    Glucosamine-Chondroitin (GLUCOSAMINE CHONDR COMPLEX PO) Take 1 capsule by mouth daily.   magnesium oxide (MAG-OX) 400 MG tablet Take 1 tablet (400 mg total) by mouth daily.   melatonin 3 MG TABS tablet Take 1 tablet (3 mg total) by mouth at bedtime.   metoprolol tartrate (LOPRESSOR) 50 MG tablet TAKE 1 TABLET BY MOUTH TWICE A DAY   omeprazole (PRILOSEC) 40 MG capsule Take 40 mg by mouth every morning.   spironolactone (ALDACTONE) 25 MG tablet Take 1 tablet (25 mg total) by mouth daily.   torsemide (DEMADEX) 20 MG tablet Take 1 tablet (20 mg total) by mouth daily.   triamcinolone lotion (KENALOG) 0.1 % Apply 1 application  topically daily as needed (for legs).   vitamin B-12 (CYANOCOBALAMIN) 1000 MCG tablet Take 1,000 mcg by mouth daily.   [DISCONTINUED] albuterol (VENTOLIN HFA) 108 (90 Base) MCG/ACT inhaler Inhale 2 puffs into the lungs every 6 (six) hours as needed for wheezing or shortness of breath.      ROS:   Please see the history of present illness.    All other systems reviewed and are negative.  EKGs   EKG:  EKG is ordered today.  The ekg ordered today demonstrates atrial fibrilation with low voltage QRS, HR 73  Recent Labs: 12/01/2022: TSH 3.248 12/02/2022: ALT 12 12/12/2022: Hemoglobin 13.4; Magnesium 1.8; Platelets 218 04/11/2023: B Natriuretic Peptide 223.9; BUN 37; Creatinine, Ser 1.79; Potassium 4.1; Sodium 137  Recent Lipid Panel No results found for: "CHOL", "TRIG", "HDL", "CHOLHDL", "VLDL", "LDLCALC", "LDLDIRECT"   Risk Assessment/Calculations:     HAS-BLED score 3 Hypertension Yes  Abnormal renal and liver function (Dialysis, transplant, Cr >2.26 mg/dL /Cirrhosis or Bilirubin >2x Normal or AST/ALT/AP >3x Normal) No  Stroke No  Bleeding Yes  Labile INR (Unstable/high INR) No  Elderly (>65) Yes  Drugs or alcohol (>= 8 drinks/week, anti-plt or NSAID) No    CHA2DS2-VASc Score = 5  The patient's score is based upon: CHF History: 1 HTN History: 1 Diabetes History:  0 Stroke History: 0 Vascular Disease History: 0 Age Score: 2 Gender Score: 1  Physical Exam:    VS:  BP 132/70   Pulse 73   Ht 5\' 8"  (1.727 m)   Wt 214 lb 3.2 oz (97.2 kg)   LMP 02/07/2012   SpO2 97%   BMI 32.57 kg/m     Wt Readings from Last 3 Encounters:  06/14/23 214 lb  3.2 oz (97.2 kg)  06/12/23 216 lb 6.4 oz (98.2 kg)  04/11/23 216 lb (98 kg)     GEN: Well nourished, well developed in no acute distress NECK: No JVD; No carotid bruits CARDIAC: irreg irreg, no murmurs, rubs, gallops RESPIRATORY:  Clear to auscultation without rales, wheezing or rhonchi  ABDOMEN: Soft, non-tender, non-distended EXTREMITIES:  2+ LE edema, L>R with blisters   ASSESSMENT:    1. Persistent atrial fibrillation (HCC)   2. Traumatic brain injury with loss of consciousness, sequela (HCC)   3. Epistaxis   4. Essential hypertension   5. Chronic heart failure with preserved ejection fraction (HCC)   6. Chronic kidney disease (CKD), stage IV (severe) (HCC)    PLAN:    In order of problems listed above:  Persistent atrial fibrillation unable to take [facility] due to previous SDH/epistaxis: Watchman planned for 06/22/23. Given abnormal renal function, plan for TEE on the table the day of the procedure rather than a contrasted CT. ECG/BMET/CBC today. Instruction letter given to patient with soap.   Medication Plan: start Plavix 7 days prior to procedure (06/15/23). On the day of implant, Aspirin will be added. Cleared for DAPT post procedure by neurosurgeon, Dr. Franky Macho.   HTN: BP well controlled today. No changes made.   Chronic HFpEF: NYHA class I symptoms. Continue on torsemide 20mg  daily and spironolactone 25mg  daily  CKD stage IV: most recent creat ~1.8. Labs today   Medication Adjustments/Labs and Tests Ordered: Current medicines are reviewed at length with the patient today.  Concerns regarding medicines are outlined above.  Orders Placed This Encounter  Procedures   Basic metabolic panel    CBC   EKG 12-Lead   Meds ordered this encounter  Medications   clopidogrel (PLAVIX) 75 MG tablet    Sig: Take 1 tablet (75 mg total) by mouth daily.    Dispense:  90 tablet    Refill:  1    Patient Instructions  Medication Instructions:  Start Clopidogrel (Plavix) 75 mg daily -Start this medication tomorrow 06/15/23  *If you need a refill on your cardiac medications before your next appointment, please call your pharmacy*   Lab Work: Bmp, Cbc - today   If you have labs (blood work) drawn today and your tests are completely normal, you will receive your results only by: MyChart Message (if you have MyChart) OR A paper copy in the mail If you have any lab test that is abnormal or we need to change your treatment, we will call you to review the results.   Testing/Procedures: As planned    Follow-Up: Follow up as scheduled   Other Instructions     Signed, Cline Crock, PA-C  06/14/2023 2:17 PM    [facility]

## 2023-06-14 ENCOUNTER — Ambulatory Visit: Payer: Medicare Other | Attending: Interventional Cardiology | Admitting: Physician Assistant

## 2023-06-14 VITALS — BP 132/70 | HR 73 | Ht 68.0 in | Wt 214.2 lb

## 2023-06-14 DIAGNOSIS — R040 Epistaxis: Secondary | ICD-10-CM | POA: Diagnosis present

## 2023-06-14 DIAGNOSIS — N184 Chronic kidney disease, stage 4 (severe): Secondary | ICD-10-CM

## 2023-06-14 DIAGNOSIS — I5032 Chronic diastolic (congestive) heart failure: Secondary | ICD-10-CM

## 2023-06-14 DIAGNOSIS — S069X9S Unspecified intracranial injury with loss of consciousness of unspecified duration, sequela: Secondary | ICD-10-CM

## 2023-06-14 DIAGNOSIS — I4819 Other persistent atrial fibrillation: Secondary | ICD-10-CM

## 2023-06-14 DIAGNOSIS — I10 Essential (primary) hypertension: Secondary | ICD-10-CM | POA: Diagnosis present

## 2023-06-14 MED ORDER — CLOPIDOGREL BISULFATE 75 MG PO TABS: 75.0000 mg | ORAL_TABLET | Freq: Every day | ORAL | 1 refills | Status: DC

## 2023-06-14 NOTE — Patient Instructions (Signed)
Medication Instructions:  Start Clopidogrel (Plavix) 75 mg daily -Start this medication tomorrow 06/15/23  *If you need a refill on your cardiac medications before your next appointment, please call your pharmacy*   Lab Work: Bmp, Cbc - today   If you have labs (blood work) drawn today and your tests are completely normal, you will receive your results only by: MyChart Message (if you have MyChart) OR A paper copy in the mail If you have any lab test that is abnormal or we need to change your treatment, we will call you to review the results.   Testing/Procedures: As planned    Follow-Up: Follow up as scheduled   Other Instructions

## 2023-06-15 LAB — BASIC METABOLIC PANEL
BUN/Creatinine Ratio: 18 (ref 12–28)
BUN: 30 mg/dL — ABNORMAL HIGH (ref 8–27)
CO2: 27 mmol/L (ref 20–29)
Calcium: 9.8 mg/dL (ref 8.7–10.3)
Chloride: 96 mmol/L (ref 96–106)
Creatinine, Ser: 1.63 mg/dL — ABNORMAL HIGH (ref 0.57–1.00)
Glucose: 105 mg/dL — ABNORMAL HIGH (ref 70–99)
Potassium: 4.4 mmol/L (ref 3.5–5.2)
Sodium: 139 mmol/L (ref 134–144)
eGFR: 31 mL/min/{1.73_m2} — ABNORMAL LOW (ref >59)

## 2023-06-15 LAB — CBC
Hematocrit: 39.9 % (ref 34.0–46.6)
Hemoglobin: 13 g/dL (ref 11.1–15.9)
MCH: 31.9 pg (ref 26.6–33.0)
MCHC: 32.6 g/dL (ref 31.5–35.7)
MCV: 98 fL — ABNORMAL HIGH (ref 79–97)
Platelets: 178 10*3/uL (ref 150–450)
RBC: 4.08 x10E6/uL (ref 3.77–5.28)
RDW: 12 % (ref 11.7–15.4)
WBC: 8.2 10*3/uL (ref 3.4–10.8)

## 2023-06-19 ENCOUNTER — Ambulatory Visit: Payer: Medicare Other | Admitting: Cardiology

## 2023-06-20 ENCOUNTER — Telehealth: Payer: Self-pay

## 2023-06-20 NOTE — Telephone Encounter (Signed)
Confirmed procedure date of 06/22/2023. Confirmed arrival time of 1100 for procedure time at 1330. Reviewed pre-procedure instructions with patient. She understands to take all meds as directed through tomorrow. She will only take Plavix and metoprolol the AM of her procedure. The patient understands to call if questions/concerns arise prior to procedure.

## 2023-06-21 NOTE — Progress Notes (Addendum)
PCP - Valla Leaver, MD  Cardiologist - Branch, Dorothe Pea, MD Electrophysiology - Cardiology Lanier Prude, MD  PPM/ICD -  Device Orders -  Rep Notified -   Chest x-ray - 11-27-22 EKG - 06-14-23 Stress Test - 01-11-12 ECHO - 04-10-22 Cardiac Cath -   CPAP -   GLP-1 -  Fasting Blood Sugar - Checks Blood Sugar /day  Blood Thinner Instructions: As directed by cardiology Aspirin Instructions:   ERAS Protcol - NPO  COVID TEST-   Anesthesia review: y  Patient verbally denies any shortness of breath, fever, cough and chest pain during phone call   -------------  SDW INSTRUCTIONS given:  Your procedure is scheduled on Sept. 26, 2024.  Report to [facility] Main Entrance "A" at 11:00 A.M., and check in at the Admitting office.  Call this number if you have problems the morning of surgery:  [phone]   Remember:  Do not eat or drink after midnight the night before your surgery      Take these medicines the morning of surgery with A SIP OF WATER as directed by cardiology  As of today, STOP taking any Aspirin (unless otherwise instructed by your surgeon) Aleve, Naproxen, Ibuprofen, Motrin, Advil, Goody's, BC's, all herbal medications, fish oil, and all vitamins.                      Do not wear jewelry, make up, or nail polish            Do not wear lotions, powders, perfumes/colognes, or deodorant.            Do not shave 48 hours prior to surgery.  Men may shave face and neck.            Do not bring valuables to the hospital.            [facility] is not responsible for any belongings or valuables.  Do NOT Smoke (Tobacco/Vaping) 24 hours prior to your procedure If you use a CPAP at night, you may bring all equipment for your overnight stay.   Contacts, glasses, dentures or bridgework may not be worn into surgery.      For patients admitted to the hospital, discharge time will be determined by your treatment team.   Patients discharged the day of  surgery will not be allowed to drive home, and someone needs to stay with them for 24 hours.    Special instructions:   [facility]- Preparing For Surgery  Before surgery, you can play an important role. Because skin is not sterile, your skin needs to be as free of germs as possible. You can reduce the number of germs on your skin by washing with CHG (chlorahexidine gluconate) Soap before surgery.  CHG is an antiseptic cleaner which kills germs and bonds with the skin to continue killing germs even after washing.    Oral Hygiene is also important to reduce your risk of infection.  Remember - BRUSH YOUR TEETH THE MORNING OF SURGERY WITH YOUR REGULAR TOOTHPASTE  Please do not use if you have an allergy to CHG or antibacterial soaps. If your skin becomes reddened/irritated stop using the CHG.  Do not shave (including legs and underarms) for at least 48 hours prior to first CHG shower. It is OK to shave your face.  Please follow these instructions carefully.   Shower the NIGHT BEFORE SURGERY and the MORNING OF SURGERY with DIAL Soap.   Pat yourself dry  with a CLEAN TOWEL.  Wear CLEAN PAJAMAS to bed the night before surgery  Place CLEAN SHEETS on your bed the night of your first shower and DO NOT SLEEP WITH PETS.   Day of Surgery: Please shower morning of surgery  Wear Clean/Comfortable clothing the morning of surgery Do not apply any deodorants/lotions.   Remember to brush your teeth WITH YOUR REGULAR TOOTHPASTE.   Questions were answered. Patient verbalized understanding of instructions.

## 2023-06-22 ENCOUNTER — Ambulatory Visit (HOSPITAL_COMMUNITY)
Admission: RE | Admit: 2023-06-22 | Discharge: 2023-06-22 | Disposition: A | Discharge status: Home / Self Care | Payer: Medicare Other | Attending: Cardiology | Admitting: Cardiology

## 2023-06-22 ENCOUNTER — Inpatient Hospital Stay (HOSPITAL_COMMUNITY): Payer: Medicare Other

## 2023-06-22 ENCOUNTER — Other Ambulatory Visit: Payer: Self-pay

## 2023-06-22 ENCOUNTER — Inpatient Hospital Stay (HOSPITAL_BASED_OUTPATIENT_CLINIC_OR_DEPARTMENT_OTHER): Payer: Medicare Other

## 2023-06-22 ENCOUNTER — Inpatient Hospital Stay (HOSPITAL_COMMUNITY): Payer: Medicare Other | Admitting: Anesthesiology

## 2023-06-22 ENCOUNTER — Ambulatory Visit (HOSPITAL_COMMUNITY)
Admission: RE | Disposition: A | Discharge status: Home / Self Care | Payer: Self-pay | Source: Home / Self Care | Attending: Cardiology

## 2023-06-22 ENCOUNTER — Encounter (HOSPITAL_COMMUNITY): Payer: Self-pay | Admitting: Cardiology

## 2023-06-22 DIAGNOSIS — I4891 Unspecified atrial fibrillation: Secondary | ICD-10-CM | POA: Diagnosis not present

## 2023-06-22 DIAGNOSIS — G4733 Obstructive sleep apnea (adult) (pediatric): Secondary | ICD-10-CM | POA: Insufficient documentation

## 2023-06-22 DIAGNOSIS — I4819 Other persistent atrial fibrillation: Secondary | ICD-10-CM

## 2023-06-22 DIAGNOSIS — I513 Intracardiac thrombosis, not elsewhere classified: Secondary | ICD-10-CM | POA: Insufficient documentation

## 2023-06-22 DIAGNOSIS — I890 Lymphedema, not elsewhere classified: Secondary | ICD-10-CM | POA: Insufficient documentation

## 2023-06-22 DIAGNOSIS — S069X9S Unspecified intracranial injury with loss of consciousness of unspecified duration, sequela: Secondary | ICD-10-CM

## 2023-06-22 DIAGNOSIS — I081 Rheumatic disorders of both mitral and tricuspid valves: Secondary | ICD-10-CM | POA: Insufficient documentation

## 2023-06-22 DIAGNOSIS — I10 Essential (primary) hypertension: Secondary | ICD-10-CM | POA: Diagnosis not present

## 2023-06-22 DIAGNOSIS — R296 Repeated falls: Secondary | ICD-10-CM | POA: Insufficient documentation

## 2023-06-22 DIAGNOSIS — R269 Unspecified abnormalities of gait and mobility: Secondary | ICD-10-CM | POA: Insufficient documentation

## 2023-06-22 DIAGNOSIS — Z87820 Personal history of traumatic brain injury: Secondary | ICD-10-CM | POA: Insufficient documentation

## 2023-06-22 DIAGNOSIS — I340 Nonrheumatic mitral (valve) insufficiency: Secondary | ICD-10-CM

## 2023-06-22 HISTORY — PX: TEE WITHOUT CARDIOVERSION: SHX5443

## 2023-06-22 HISTORY — PX: LEFT ATRIAL APPENDAGE OCCLUSION: EP1229

## 2023-06-22 LAB — TYPE AND SCREEN
ABO/RH(D): B POS
Antibody Screen: NEGATIVE

## 2023-06-22 LAB — ECHO TEE
MV M vel: 5.2 m/s
MV Peak grad: 108.2 mmHg
Radius: 0.7 cm

## 2023-06-22 SURGERY — LEFT ATRIAL APPENDAGE OCCLUSION
Anesthesia: General

## 2023-06-22 MED ORDER — SODIUM CHLORIDE 0.9 % IV SOLN: INTRAVENOUS | Status: DC

## 2023-06-22 MED ORDER — SUGAMMADEX SODIUM 200 MG/2ML IV SOLN
INTRAVENOUS | Status: DC | PRN
  Administered 2023-06-22: 200 mg via INTRAVENOUS

## 2023-06-22 MED ORDER — LIDOCAINE 2% (20 MG/ML) 5 ML SYRINGE
INTRAMUSCULAR | Status: DC | PRN
  Administered 2023-06-22: 20 mg via INTRAVENOUS

## 2023-06-22 MED ORDER — FENTANYL CITRATE (PF) 250 MCG/5ML IJ SOLN
INTRAMUSCULAR | Status: DC | PRN
  Administered 2023-06-22: 50 ug via INTRAVENOUS

## 2023-06-22 MED ORDER — PHENYLEPHRINE HCL-NACL 20-0.9 MG/250ML-% IV SOLN
INTRAVENOUS | Status: DC | PRN
  Administered 2023-06-22: 60 ug/min via INTRAVENOUS

## 2023-06-22 MED ORDER — OXYCODONE HCL 5 MG PO TABS: 5.0000 mg | ORAL_TABLET | Freq: Once | ORAL | Status: DC | PRN

## 2023-06-22 MED ORDER — MIDAZOLAM HCL 2 MG/2ML IJ SOLN: 0.5000 mg | Freq: Once | INTRAMUSCULAR | Status: DC | PRN

## 2023-06-22 MED ORDER — PROPOFOL 10 MG/ML IV BOLUS
INTRAVENOUS | Status: DC | PRN
  Administered 2023-06-22: 20 mg via INTRAVENOUS
  Administered 2023-06-22: 100 mg via INTRAVENOUS

## 2023-06-22 MED ORDER — ACETAMINOPHEN 500 MG PO TABS
1000.0000 mg | ORAL_TABLET | Freq: Once | ORAL | Status: AC
  Administered 2023-06-22: 1000 mg via ORAL
  Filled 2023-06-22: qty 2

## 2023-06-22 MED ORDER — PANTOPRAZOLE SODIUM 20 MG PO TBEC: 20.0000 mg | DELAYED_RELEASE_TABLET | Freq: Every day | ORAL | 3 refills | Status: DC

## 2023-06-22 MED ORDER — HEPARIN (PORCINE) IN NACL 2000-0.9 UNIT/L-% IV SOLN: INTRAVENOUS | Status: DC | PRN

## 2023-06-22 MED ORDER — ROCURONIUM BROMIDE 10 MG/ML (PF) SYRINGE
PREFILLED_SYRINGE | INTRAVENOUS | Status: DC | PRN
  Administered 2023-06-22: 60 mg via INTRAVENOUS

## 2023-06-22 MED ORDER — MEPERIDINE HCL 25 MG/ML IJ SOLN: 6.2500 mg | INTRAMUSCULAR | Status: DC | PRN

## 2023-06-22 MED ORDER — DEXAMETHASONE SODIUM PHOSPHATE 10 MG/ML IJ SOLN
INTRAMUSCULAR | Status: DC | PRN
  Administered 2023-06-22: 4 mg via INTRAVENOUS

## 2023-06-22 MED ORDER — PROPOFOL 500 MG/50ML IV EMUL
INTRAVENOUS | Status: DC | PRN
Start: 2023-06-22 — End: 2023-06-22
  Administered 2023-06-22: 85 ug/kg/min via INTRAVENOUS

## 2023-06-22 MED ORDER — PROMETHAZINE HCL 25 MG/ML IJ SOLN: 6.2500 mg | INTRAMUSCULAR | Status: DC | PRN

## 2023-06-22 MED ORDER — OXYCODONE HCL 5 MG/5ML PO SOLN: 5.0000 mg | Freq: Once | ORAL | Status: DC | PRN

## 2023-06-22 MED ORDER — HYDROMORPHONE HCL 1 MG/ML IJ SOLN: 0.2500 mg | INTRAMUSCULAR | Status: DC | PRN

## 2023-06-22 MED ORDER — FENTANYL CITRATE (PF) 100 MCG/2ML IJ SOLN
INTRAMUSCULAR | Status: AC
  Filled 2023-06-22: qty 2

## 2023-06-22 MED ORDER — CHLORHEXIDINE GLUCONATE 0.12 % MT SOLN
15.0000 mL | Freq: Once | OROMUCOSAL | Status: AC
  Administered 2023-06-22: 15 mL via OROMUCOSAL
  Filled 2023-06-22: qty 15

## 2023-06-22 MED ORDER — DIPHENHYDRAMINE HCL 50 MG/ML IJ SOLN
INTRAMUSCULAR | Status: DC | PRN
Start: 2023-06-22 — End: 2023-06-22
  Administered 2023-06-22: 6.25 mg via INTRAVENOUS

## 2023-06-22 MED ORDER — FENTANYL CITRATE (PF) 100 MCG/2ML IJ SOLN: 25.0000 ug | INTRAMUSCULAR | Status: DC | PRN

## 2023-06-22 MED ORDER — HEPARIN (PORCINE) IN NACL 1000-0.9 UT/500ML-% IV SOLN: INTRAVENOUS | Status: DC | PRN

## 2023-06-22 MED ORDER — CHLORHEXIDINE GLUCONATE 4 % EX SOLN
Freq: Once | CUTANEOUS | Status: DC
  Filled 2023-06-22: qty 15

## 2023-06-22 MED ORDER — CEFAZOLIN SODIUM-DEXTROSE 2-4 GM/100ML-% IV SOLN
2.0000 g | INTRAVENOUS | Status: AC
  Administered 2023-06-22: 2 g via INTRAVENOUS
  Filled 2023-06-22: qty 100

## 2023-06-22 SURGICAL SUPPLY — 15 items
BLANKET WARM UNDERBOD FULL ACC (MISCELLANEOUS) ×1 IMPLANT
CATH INFINITI 5FR ANG PIGTAIL (CATHETERS) IMPLANT
CLOSURE PERCLOSE PROSTYLE (VASCULAR PRODUCTS) IMPLANT
DILATOR VESSEL 38 20CM 12FR (INTRODUCER) IMPLANT
ELECT DEFIB PAD ADLT CADENCE (PAD) IMPLANT
GUIDEWIRE INQWIRE 1.5J.035X260 (WIRE) ×1 IMPLANT
INQWIRE 1.5J .035X260CM (WIRE)
PACK CARDIAC CATHETERIZATION (CUSTOM PROCEDURE TRAY) ×1 IMPLANT
PAD DEFIB RADIO PHYSIO CONN (PAD) ×1 IMPLANT
SHEATH PINNACLE 8F 10CM (SHEATH) IMPLANT
SHEATH PROBE COVER 6X72 (BAG) ×1 IMPLANT
SHIELD RADPAD SCOOP 12X17 (MISCELLANEOUS) ×1 IMPLANT
SYR CONTROL 10ML ANGIOGRAPHIC (SYRINGE) IMPLANT
TRANSDUCER W/STOPCOCK (MISCELLANEOUS) ×1 IMPLANT
TUBING CIL FLEX 10 FLL-RA (TUBING) ×1 IMPLANT

## 2023-06-22 NOTE — Anesthesia Preprocedure Evaluation (Signed)
Anesthesia Evaluation  Patient identified by MRN, date of birth, ID band Patient awake    Reviewed: Allergy & Precautions, NPO status , Patient's Chart, lab work & pertinent test results  History of Anesthesia Complications (+) PONV  Airway Mallampati: II  TM Distance: >3 FB Neck ROM: Full    Dental  (+) Dental Advisory Given, Teeth Intact   Pulmonary sleep apnea and Continuous Positive Airway Pressure Ventilation    breath sounds clear to auscultation       Cardiovascular hypertension, Pt. on medications and Pt. on home beta blockers pulmonary hypertension Rhythm:Regular Rate:Normal  01/2023 ECHO: EF 60-65%, normal LVF, mod reduced RVF, mod pulm HTN, severe TR   Neuro/Psych  Headaches  Anxiety        GI/Hepatic ,GERD  Medicated and Controlled,,  Endo/Other    Renal/GU Renal InsufficiencyRenal disease     Musculoskeletal   Abdominal   Peds  Hematology plavix   Anesthesia Other Findings   Reproductive/Obstetrics                              Anesthesia Physical Anesthesia Plan  ASA: 3  Anesthesia Plan: General   Post-op Pain Management: Tylenol PO (pre-op)*   Induction: Intravenous  PONV Risk Score and Plan: 4 or greater and Ondansetron, Dexamethasone, Diphenhydramine and Treatment may vary due to age or medical condition  Airway Management Planned: Oral ETT  Additional Equipment: None  Intra-op Plan:   Post-operative Plan: Extubation in OR  Informed Consent: I have reviewed the patients History and Physical, chart, labs and discussed the procedure including the risks, benefits and alternatives for the proposed anesthesia with the patient or authorized representative who has indicated his/her understanding and acceptance.     Dental advisory given  Plan Discussed with: CRNA and Surgeon  Anesthesia Plan Comments:          Anesthesia Quick Evaluation

## 2023-06-22 NOTE — H&P (Signed)
Electrophysiology Office Note:     Date:  06/22/2023    ID:  [name], DOB 09-12-1941, MRN [id number]   [facility] Cardiologist:  Dina Rich, MD  [facility] Electrophysiologist:  Lanier Prude, MD    Referring MD: Gennie Alma*    Chief Complaint: AF   History of Present Illness:     [name] is a 82 y.o. female who I am seeing today for an evaluation fo AF at the request of Dr Wyline Mood. She has a hx of persistent AF, HTN, OSA, lymphedema, TBI, SAH/SDH s/p fall.    She is not currently taking OAC due to her previous falls.    Today, she is accompanied by her daughter. She confirms having two falls that were 5 days apart. Her second fall was on a Friday, and then the next Sunday she developed a brain bleed. She was hospitalized 11/2022 and since then has remained off of anticoagulation. Currently she is in physical therapy twice a week.    In the past she has had issues with occasional, intermittent epistaxis. At one time the bleeding lasted for a prolonged time and she called urgent care for assistance. She was guided through the episode and the bleeding did resolve eventually without needing surgical intervention. She denies any recent issues with epistaxis.   Her blood pressure is well controlled in the office today.   In her family, she reports that both her mother and one of her daughters had a brain aneurysm.   She denies any palpitations, chest pain, shortness of breath, or peripheral edema. No lightheadedness, headaches, syncope, orthopnea, or PND.   Presents for [employer] implant. Procedure reviewed.    Objective Their past medical, social and family history was reveiwed.     ROS:   Please see the history of present illness.    All other systems reviewed and are negative.   EKGs/Labs/Other Studies Reviewed:     The following studies were reviewed today:   11/29/2022 Echo EF 55 RV moderately reduced Dilated LA/RA Mild-mod MR Severe  TR     Physical Exam:     VS:  BP 131/61   Pulse 73   Ht 5\' 8"  (1.727 m)   Wt 216 lb (98 kg)   LMP 02/07/2012   SpO2 95%   BMI 32.84 kg/m         Wt Readings from Last 3 Encounters:  02/07/23 216 lb (98 kg)  01/17/23 215 lb 9.6 oz (97.8 kg)  01/09/23 218 lb 9.6 oz (99.2 kg)      GEN:  Well nourished, well developed in no acute distress CARDIAC: RRR, no murmurs, rubs, gallops RESPIRATORY:  Clear to auscultation without rales, wheezing or rhonchi          Assessment ASSESSMENT AND PLAN:     1. Persistent atrial fibrillation (HCC)   2. Traumatic brain injury with loss of consciousness, sequela (HCC)       #Persistent AF Likely permanent. Not on OAC due to hx of SAH/SDH and falls. I have discussed her AF related stroke risk in detail. I discussed LAAO as a mechanism to reduce stroke risk.   If she elects to proceed with Watchman would plan for DAPT implant. She would start Plavix 7 days prior to procedure. On the day of implant, Aspirin would be added. She will need to touch base with her neurosurgeon to confirm we are OK to restart antiplatelet therapy after her ICH in setting of fall. I did send  a note to Dr Franky Macho this evening asking about this issue.   ------------------   I have seen [name] in the office today who is being considered for a Watchman left atrial appendage closure device. I believe they will benefit from this procedure given their history of atrial fibrillation, CHA2DS2-VASc score of 5. Unfortunately, the patient is not felt to be a long term anticoagulation candidate secondary to hx of SAH/SDH. The patient's chart has been reviewed and I feel that they would be a candidate for short term oral anticoagulation after Watchman implant.    It is my belief that after undergoing a LAA closure procedure, [name] will not need long term anticoagulation which eliminates anticoagulation side effects and major bleeding risk.    Procedural risks for the  Watchman implant have been reviewed with the patient including a 0.5% risk of stroke, <1% risk of perforation and <1% risk of device embolization. Other risks include bleeding, vascular damage, tamponade, worsening renal function, and death. The patient understands these risk and wishes to proceed.       The published clinical data on the safety and effectiveness of WATCHMAN include but are not limited to the following: - Holmes DR, Everlene Farrier, Sick P et al. for the PROTECT AF Investigators. Percutaneous closure of the left atrial appendage versus warfarin therapy for prevention of stroke in patients with atrial fibrillation: a randomised non-inferiority trial. Lancet 2009; 374: 534-42. Everlene Farrier, Doshi SK, Isa Rankin D et al. on behalf of the PROTECT AF Investigators. Percutaneous Left Atrial Appendage Closure for Stroke Prophylaxis in Patients With Atrial Fibrillation 2.3-Year Follow-up of the PROTECT AF (Watchman Left Atrial Appendage System for Embolic Protection in Patients With Atrial Fibrillation) Trial. Circulation 2013; 127:720-729. - Alli O, Doshi S,  Kar S, Reddy VY, Sievert H et al. Quality of Life Assessment in the Randomized PROTECT AF (Percutaneous Closure of the Left Atrial Appendage Versus Warfarin Therapy for Prevention of Stroke in Patients With Atrial Fibrillation) Trial of Patients at Risk for Stroke With Nonvalvular Atrial Fibrillation. J Am Coll Cardiol 2013; 61:1790-8. Aline August DR, Mia [place], Price M, Whisenant B, Sievert H, Doshi S, Huber K, Reddy V. Prospective randomized evaluation of the Watchman left atrial appendage Device in patients with atrial fibrillation versus long-term warfarin therapy; the PREVAIL trial. Journal of the [employer] of Cardiology, Vol. 4, No. 1, 2014, 1-11. - Kar S, Doshi SK, Sadhu A, Horton R, Osorio J et al. Primary outcome evaluation of a next-generation left atrial appendage closure device: results from the PINNACLE FLX trial. Circulation  2021;143(18)1754-1762.      After today's visit with the patient which was dedicated solely for shared decision making visit regarding LAA closure device, the patient decided to proceed with the LAA appendage closure procedure scheduled to be done in the near future at [facility]. Given abnormal renal function, favor TEE on the table the day of the procedure rather than a contrasted CT.        HAS-BLED score 3 Hypertension Yes  Abnormal renal and liver function (Dialysis, transplant, Cr >2.26 mg/dL /Cirrhosis or Bilirubin >2x Normal or AST/ALT/AP >3x Normal) No  Stroke No  Bleeding Yes  Labile INR (Unstable/high INR) No  Elderly (>65) Yes  Drugs or alcohol (>= 8 drinks/week, anti-plt or NSAID) No    CHA2DS2-VASc Score = 5  The patient's score is based upon: CHF History: 1 HTN History: 1 Diabetes History: 0 Stroke History: 0 Vascular Disease History: 0  Age Score: 2 Gender Score: 1     Presents for Watchman implant today. Procedure reviewed.    Signed, Rossie Muskrat. Lalla Brothers, MD, [facility], [facility] 06/22/2023 Electrophysiology [facility]

## 2023-06-22 NOTE — Progress Notes (Signed)
  HEART AND VASCULAR CENTER   MULTIDISCIPLINARY HEART TEAM   Patient presented for LAAO procedure with Dr. Lalla Brothers however TEE performed prior to procedure start to evaluate LAA which showed thrombus therefore Watchman case was cancelled and the patient to be discharged home after stable from an anesthesia standpoint. Discussed plan with family and Dr. Lalla Brothers. Patient has been added to Dr. Lalla Brothers office clinic schedule 10/16 at 4:30pm to further discuss.   Georgie Chard NP-C Structural Heart Team  Pager: [phone] Phone: [phone]

## 2023-06-22 NOTE — Transfer of Care (Signed)
Immediate Anesthesia Transfer of Care Note  Patient: [name]  Procedure(s) Performed: LEFT ATRIAL APPENDAGE OCCLUSION TRANSESOPHAGEAL ECHOCARDIOGRAM  Patient Location: PACU  Anesthesia Type:General  Level of Consciousness: awake, alert , and oriented  Airway & Oxygen Therapy: Patient Spontanous Breathing  Post-op Assessment: Report given to RN and Post -op Vital signs reviewed and stable  Post vital signs: Reviewed and stable  Last Vitals:  Vitals Value Taken Time  BP 127/68 06/22/23 1415  Temp 36.6 C 06/22/23 1350  Pulse 59 06/22/23 1419  Resp 13 06/22/23 1419  SpO2 98 % 06/22/23 1419  Vitals shown include unfiled device data.  Last Pain:  Vitals:   06/22/23 1350  TempSrc: Temporal  PainSc: 0-No pain         Complications: There were no known notable events for this encounter.

## 2023-06-22 NOTE — Discharge Instructions (Signed)
[facility] Procedure, Care After  Procedure MD: Dr. Isidoro Donning Clinical Coordinator: Robin Searing, RN  This sheet gives you information about how to care for yourself after your procedure. Your health care provider may also give you more specific instructions. If you have problems or questions, contact your health care provider.  What can I expect after the procedure? After the procedure, it is common to have:  Bruising around your puncture site.  Tenderness around your puncture site.  Tiredness (fatigue).  Medication instructions . It is very important to continue to take your blood thinner as well as Aspirin after the Watchman procedure. Call your procedure doctor's office with question or concerns. . If you are on Coumadin (warfarin), you will have your INR checked the week after your procedure, with a goal INR of 2.0 - 3.0. Marland Kitchen Please follow your medication instructions on your discharge summary. Only take the medications listed on your discharge paperwork.  Follow up . You will be seen in 1 week and 6 weeks after your procedure in the Atrial Fibrillation Clinic . You will have another TEE (Transesophageal Echocardiogram) approximately 6 weeks after your procedure mark to check your device . You will follow up the MD who performed your procedure 6 months after your procedure . The Watchman Clinical Coordinator will check in with you from time to time, including 1 and 2 years after your procedure.    Follow these instructions at home: Puncture site care   Follow instructions from your health care provider about how to take care of your puncture site. Make sure you: ? If present, leave stitches (sutures), skin glue, or adhesive strips in place.  ? If a large square bandage is present, this may be removed 24 hours after surgery.   Check your puncture site every day for signs of infection. Check for: ? Redness, swelling, or pain. ? Fluid or blood. If your puncture site starts  to bleed, lie down on your back, apply firm pressure to the area, and contact your health care provider. ? Warmth. ? Pus or a bad smell. Driving  Do not drive yourself home if you received sedation  Do not drive for at least 4 days after your procedure or however long your health care provider recommends. (Do not resume driving if you have previously been instructed not to drive for other health reasons.)  Do not spend greater than 1 hour at a time in a car for the first 3 days. Stop and take a break with a 5 minute walk at least every hour.   Do not drive or use heavy machinery while taking prescription pain medicine.  Activity  Avoid activities that take a lot of effort, including exercise, for at least 7 days after your procedure.  For the first 3 days, avoid sitting for longer than one hour at a time.   Avoid alcoholic beverages, signing paperwork, or participating in legal proceedings for 24 hours after receiving sedation  Do not lift anything that is heavier than 10 lb (4.5 kg) for one week.   No sexual activity for 1 week.   Return to your normal activities as told by your health care provider. Ask your health care provider what activities are safe for you. General instructions  Take over-the-counter and prescription medicines only as told by your health care provider.  Do not use any products that contain nicotine or tobacco, such as cigarettes and e-cigarettes. If you need help quitting, ask your health care provider.  You  may shower after 24 hours, but Do not take baths, swim, or use a hot tub for 1 week.   Do not drink alcohol for 24 hours after your procedure.  Keep all follow-up visits as told by your health care provider. This is important.  Dental Work: You will require antibiotics prior to any dental work, including cleanings, for 6 months after your Watchman implantation to help protect you from infection. After 6 months, antibiotics are no longer  required. Contact a health care provider if:  You have redness, mild swelling, or pain around your puncture site.  You have soreness in your throat or at your puncture site that does not improve after several days  You have fluid or blood coming from your puncture site that stops after applying firm pressure to the area.  Your puncture site feels warm to the touch.  You have pus or a bad smell coming from your puncture site.  You have a fever.  You have chest pain or discomfort that spreads to your neck, jaw, or arm.  You are sweating a lot.  You feel nauseous.  You have a fast or irregular heartbeat.  You have shortness of breath.  You are dizzy or light-headed and feel the need to lie down.  You have pain or numbness in the arm or leg closest to your puncture site. Get help right away if:  Your puncture site suddenly swells.  Your puncture site is bleeding and the bleeding does not stop after applying firm pressure to the area. These symptoms may represent a serious problem that is an emergency. Do not wait to see if the symptoms will go away. Get medical help right away. Call your local emergency services (911 in the [place] Do not drive yourself to the hospital. Summary  After the procedure, it is normal to have bruising and tenderness at the puncture site in your groin, neck, or forearm.  Check your puncture site every day for signs of infection.  Get help right away if your puncture site is bleeding and the bleeding does not stop after applying firm pressure to the area. This is a medical emergency.  This information is not intended to replace advice given to you by your health care provider. Make sure you discuss any questions you have with your health care provider.

## 2023-06-22 NOTE — Anesthesia Postprocedure Evaluation (Signed)
Anesthesia Post Note  Patient: Firefighter  Procedure(s) Performed: LEFT ATRIAL APPENDAGE OCCLUSION TRANSESOPHAGEAL ECHOCARDIOGRAM     Patient location during evaluation: Phase II Anesthesia Type: General Level of consciousness: awake and alert, oriented and patient cooperative Pain management: pain level controlled Vital Signs Assessment: post-procedure vital signs reviewed and stable Respiratory status: spontaneous breathing, nonlabored ventilation and respiratory function stable Cardiovascular status: blood pressure returned to baseline and stable Postop Assessment: no apparent nausea or vomiting and adequate PO intake Anesthetic complications: no   There were no known notable events for this encounter.  Last Vitals:  Vitals:   06/22/23 1455 06/22/23 1500  BP: 117/70 (!) 111/29  Pulse: 64 66  Resp:    Temp:    SpO2: 97% 97%    Last Pain:  Vitals:   06/22/23 1415  TempSrc: Temporal  PainSc: 0-No pain                 Rylie Limburg,E. Tacori Kvamme

## 2023-06-23 ENCOUNTER — Telehealth: Payer: Self-pay | Admitting: Cardiology

## 2023-06-23 ENCOUNTER — Encounter (HOSPITAL_COMMUNITY): Payer: Self-pay | Admitting: Cardiology

## 2023-06-23 MED FILL — Fentanyl Citrate Preservative Free (PF) Inj 100 MCG/2ML: INTRAMUSCULAR | Qty: 1 | Status: AC

## 2023-06-23 NOTE — Telephone Encounter (Signed)
  HEART AND VASCULAR CENTER   MULTIDISCIPLINARY HEART TEAM   Patient contacted regarding discharge from [facility] on 06/22/23 s/p aborted Watchman. Patient arrived with plan for TEE prior to start of case. Unfortunately, found to have LAA thrombus on TEE therefore case was cancelled and patient was discharged. Per Dr. Lalla Brothers, patient scheduled for close follow up to determine candidacy for potential surgical LAA clipping.    Patient understands to follow up with provider Dr. Lalla Brothers 10/16   Georgie Chard NP-C Structural Heart Team  Pager: [phone]

## 2023-07-12 ENCOUNTER — Encounter: Payer: Self-pay | Admitting: Cardiology

## 2023-07-12 ENCOUNTER — Ambulatory Visit: Payer: Medicare Other | Attending: Cardiology | Admitting: Cardiology

## 2023-07-12 VITALS — BP 124/74 | HR 72 | Ht 68.0 in | Wt 209.0 lb

## 2023-07-12 DIAGNOSIS — I4819 Other persistent atrial fibrillation: Secondary | ICD-10-CM | POA: Diagnosis present

## 2023-07-12 DIAGNOSIS — I629 Nontraumatic intracranial hemorrhage, unspecified: Secondary | ICD-10-CM | POA: Diagnosis not present

## 2023-07-12 DIAGNOSIS — R040 Epistaxis: Secondary | ICD-10-CM | POA: Diagnosis not present

## 2023-07-12 NOTE — Patient Instructions (Signed)
Medication Instructions:  Your physician recommends that you continue on your current medications as directed. Please refer to the Current Medication list given to you today.  *If you need a refill on your cardiac medications before your next appointment, please call your pharmacy*  Follow-Up: At [facility], you and your health needs are our priority.  As part of our continuing mission to provide you with exceptional heart care, we have created designated Provider Care Teams.  These Care Teams include your primary Cardiologist (physician) and Advanced Practice Providers (APPs -  Physician Assistants and Nurse Practitioners) who all work together to provide you with the care you need, when you need it.  Your next appointment:   As needed with Dr. Lambert 

## 2023-07-12 NOTE — Progress Notes (Signed)
Electrophysiology Office Follow up Visit Note:    Date:  07/12/2023   ID:  [name], DOB 03-02-1941, MRN [id number]  PCP:  Valla Leaver, MD  [facility] Cardiologist:  Dina Rich, MD  [facility] Electrophysiologist:  Lanier Prude, MD    Interval History:    [name] is a 82 y.o. female who presents for a follow up visit.   The patient presented for watchman implant June 22, 2023 but this was aborted given left atrial appendage thrombus and torrential tricuspid regurgitation. I last saw her in clinic Feb 07, 2023.  She was referred for watchman given history of subarachnoid hemorrhage/subdural hematoma.  She was not taking anticoagulation.  We are planning to implant the watchman using DAPT.  She is with her daughter today in clinic.  She has been tolerating the Plavix.     Past medical, surgical, social and family history were reviewed.  ROS:   Please see the history of present illness.    All other systems reviewed and are negative.  EKGs/Labs/Other Studies Reviewed:    The following studies were reviewed today:   Intraprocedural transesophageal echo reviewed from September 26        Physical Exam:    VS:  BP 124/74   Pulse 72   Ht 5\' 8"  (1.727 m)   Wt 209 lb (94.8 kg)   LMP 02/07/2012   SpO2 95%   BMI 31.78 kg/m     Wt Readings from Last 3 Encounters:  07/12/23 209 lb (94.8 kg)  06/22/23 210 lb (95.3 kg)  06/14/23 214 lb 3.2 oz (97.2 kg)     GEN:  Well nourished, well developed in no acute distress.  Elderly CARDIAC: Irregularly irregular, no murmurs, rubs, gallops RESPIRATORY:  Clear to auscultation without rales, wheezing or rhonchi       ASSESSMENT:    1. Persistent atrial fibrillation (HCC)   2. Epistaxis   3. Intracranial hemorrhage (HCC)    PLAN:    In order of problems listed above:  #Permanent atrial fibrillation #Intracranial hemorrhage Watchman previously aborted given left atrial  appendage thrombus.  History of intracranial hemorrhage makes anticoagulation choice difficult.  I do not think she is at appropriate risk to undergo watchman implant or surgical closure.  She is tolerating the Plavix.  We had a long discussion today about the pros and cons of staying on Plavix.  She understands she is at a slight increased risk of bleeding while on any antiplatelet or anticoagulant but understands the risk of stroke is also there if she is off all antiplatelets and anticoagulants.  For now she will continue on the Plavix monotherapy.  EP will be available on an as-needed basis.      Signed, Steffanie Dunn, MD, [facility], [facility] 07/12/2023 5:24 PM    Electrophysiology [facility]

## 2023-07-24 ENCOUNTER — Telehealth: Payer: Self-pay

## 2023-07-24 NOTE — Telephone Encounter (Signed)
The patient reports she is doing well on the Plavix. She has had "slightly" bloody tissues when she wipes her nose on two occasions, but never more than 1 tissue worth of streaking. She understands to call if she needs a sooner appointment with Dr. Wyline Mood. She was grateful for check-in.

## 2023-07-24 NOTE — Telephone Encounter (Signed)
Called to check on patient 1 month post cancelled LAAO procedure.  Voicemail is full- will try to reach again later.

## 2023-09-05 ENCOUNTER — Telehealth: Payer: Self-pay

## 2023-09-05 NOTE — Telephone Encounter (Signed)
The patient reports she lost her insurance cards and the last time she thinks she had them was at her 10/16 visit with Dr. Lalla Brothers. She had an appointment with her eye doctor today and they need her insurance information or they will charge her full price. Reiterated to her that the office likely does not have her cards as her visit was almost 2 months ago, but that her chart copy of her cards would be mailed to her doctor. She was grateful for assistance.  Both [employer] and Medicare copies faxed to: [facility], [phone]   Per patient request, will send a message to Dr. Lovena Neighbours nurse to see if a small red card holder with insurance cards was found at the office.

## 2023-09-05 NOTE — Telephone Encounter (Signed)
Advised patient that I checked our lost and found and did not locate her card holder.

## 2023-09-09 ENCOUNTER — Other Ambulatory Visit: Payer: Self-pay | Admitting: Cardiology

## 2023-12-06 ENCOUNTER — Other Ambulatory Visit: Payer: Self-pay | Admitting: Physician Assistant

## 2023-12-07 ENCOUNTER — Other Ambulatory Visit: Payer: Self-pay | Admitting: Cardiology

## 2023-12-18 ENCOUNTER — Encounter: Payer: Self-pay | Admitting: *Deleted

## 2023-12-20 ENCOUNTER — Ambulatory Visit: Payer: Medicare Other | Attending: Cardiology | Admitting: Cardiology

## 2023-12-20 ENCOUNTER — Encounter: Payer: Self-pay | Admitting: Cardiology

## 2023-12-20 VITALS — BP 122/72 | HR 76 | Ht 68.0 in | Wt 217.6 lb

## 2023-12-20 DIAGNOSIS — I272 Pulmonary hypertension, unspecified: Secondary | ICD-10-CM | POA: Insufficient documentation

## 2023-12-20 DIAGNOSIS — I480 Paroxysmal atrial fibrillation: Secondary | ICD-10-CM | POA: Insufficient documentation

## 2023-12-20 DIAGNOSIS — I10 Essential (primary) hypertension: Secondary | ICD-10-CM | POA: Insufficient documentation

## 2023-12-20 DIAGNOSIS — I5032 Chronic diastolic (congestive) heart failure: Secondary | ICD-10-CM | POA: Insufficient documentation

## 2023-12-20 DIAGNOSIS — Z79899 Other long term (current) drug therapy: Secondary | ICD-10-CM | POA: Insufficient documentation

## 2023-12-20 MED ORDER — PANTOPRAZOLE SODIUM 20 MG PO TBEC: 20.0000 mg | DELAYED_RELEASE_TABLET | Freq: Every day | ORAL | 1 refills | Status: DC

## 2023-12-20 NOTE — Patient Instructions (Signed)
 Medication Instructions:   Protonix refilled today for 90 day supply  Continue all other medications.     Labwork:  BMET, Mg - orders given Office will contact with results via phone, letter or mychart.     Testing/Procedures:  none  Follow-Up:  6 months   Any Other Special Instructions Will Be Listed Below (If Applicable).   If you need a refill on your cardiac medications before your next appointment, please call your pharmacy.

## 2023-12-20 NOTE — Progress Notes (Signed)
 Clinical Summary [name] is a 83 y.o.female seen today for follow up of the following medical problems.    1.Chronic diastolic HF    - 09/6107 echo LVEF 55-60%, indet diastolic fxn Dec 18, 2021 echo: LVEF 60-65%, noWMAs, indet diastolic, normal RV, mild PASP 41 mmHg, severe LAE, RA pressure 15, severe TR    11/2022 echo: LVEF 55-60%. Indet diastolic fxn. Dshaped septum systole and diastole, mod RV dysfunction    01/2023 echo: LVEF 60-65%, indet diastolic function, RV volume overload, mod RV dysfunction, mod pulm HTN, mod BAE, severe TR   - Cr up 12/2022, we changed torsemide from 40mg  to 20mg  daily and stopped daily potassium. Cr was up to 2.58, K 5.8 - Cr improved on repeat labs to 1.88. Baseline 1.4-1.5   -no SOB/DOE. No recent edema. Takes torsemide 20mg  daily, will take addiontal 20mg  prn if needed which rare.  - home weight 212-216 lbs.    2.Pulmonary HTN 11/2022 echo: LVEF 55-60%. Indet diastolic fxn. Dshaped septum systole and diastole, mod RV dysfunction. PASP 39. Severe TR - since time of echo down 74 lbs, repeat echo and reassess PA pressure and RV function. Suspect a large part of it was due to elevated left sided pressures      01/2023 echo: LVEF 60-65%, indet diastolic function, RV volume overload, mod RV dysfunction, mod pulm HTN, mod BAE, severe TR  - seen by HF clinic, recs to continue managing her diastolic and OSA. 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  - no symptoms     -prior admit with SAH/SDH 11/2022 - from neurosurg note thought that eliquis greater risk than benefit -referred to Dr Lalla Brothers for watchman eval, plans for implant later this month   -watchman was aborted due to lef atrial appendage thrombus. - has been just on plavix.  - no recent palpitaiotns.      4.  OSA - not using a cpap, has not been interested in - discussed again, does not want to readdress at this time.       5. Chronic venous insufficiency - followed by vascular Dr Hart Rochester - swelling up and down.  - previously seen in wound clinic.  - wears compression stockings  - has compression socks        6. Large complex cystic mass - noted by [place] medial right thigh, stable from prior US 4 months ago - normal Hgb on check, we have continued anticoag. 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, traumatic head bleed - managed without surgery    SH: her significant other is Renold Genta who is also a patient of mine who passed away December 19, 2019 Past Medical History:  Diagnosis Date   Anemia    Anxiety    Arthritis    Back pain    reason unknown   Cataract    bilateral immature   Diarrhea    Diverticulosis    GERD (gastroesophageal reflux disease)    takes Omeprazole daily   Headache(784.0)    occasionally   Hiatal hernia    History of blood transfusion    no abnormal reaction noted   History of bronchitis 2014   History of colon polyps  History of shingles    Hypertension    takes Metoprolol,Amlodipine and Losartan daily   Internal hemorrhoids    Joint pain    Joint swelling    Lung nodule    Nocturia    Numbness    hands occasionally   Osteoarthritis    Peripheral edema    takes HCTZ daily and Furosemide daily as needed   PONV (postoperative nausea and vomiting)    Redness    to both lower extremities   Shortness of breath    WITH EXERTION    Sleep apnea    doesn't use cpap   Urinary frequency    Urinary urgency    Venous insufficiency      Allergies  Allergen Reactions   Sulfa Antibiotics     Reaction unknown   Adhesive [Tape] Rash and Other (See Comments)    Skin is very sensitive     Current Outpatient Medications  Medication Sig Dispense Refill   acetaminophen (TYLENOL) 325 MG  tablet Take 1-2 tablets (325-650 mg total) by mouth every 4 (four) hours as needed for mild pain.     Biotin 5000 MCG CAPS Take 5,000 mcg by mouth daily.     Calcium Citrate-Vitamin D 500-12.5 MG-MCG PACK Take 1 tablet by mouth daily.     clopidogrel (PLAVIX) 75 MG tablet TAKE 1 TABLET BY MOUTH EVERY DAY 90 tablet 2   ferrous sulfate 325 (65 FE) MG tablet Take 325 mg by mouth 2 (two) times daily with a meal.     Glucosamine-Chondroitin (GLUCOSAMINE CHONDR COMPLEX PO) Take 1 capsule by mouth daily.     magnesium oxide (MAG-OX) 400 MG tablet Take 1 tablet (400 mg total) by mouth daily. 30 tablet 0   melatonin 3 MG TABS tablet Take 1 tablet (3 mg total) by mouth at bedtime. 30 tablet 0   metoprolol tartrate (LOPRESSOR) 50 MG tablet TAKE 1 TABLET BY MOUTH TWICE A DAY 180 tablet 0   pantoprazole (PROTONIX) 20 MG tablet Take 1 tablet (20 mg total) by mouth daily. 60 tablet 3   spironolactone (ALDACTONE) 25 MG tablet Take 1 tablet (25 mg total) by mouth daily. 90 tablet 3   torsemide (DEMADEX) 20 MG tablet Take 1 tablet (20 mg total) by mouth daily. 90 tablet 3   triamcinolone lotion (KENALOG) 0.1 % Apply 1 application  topically daily as needed (for legs).     vitamin B-12 (CYANOCOBALAMIN) 1000 MCG tablet Take 1,000 mcg by mouth daily.     No current facility-administered medications for this visit.     Past Surgical History:  Procedure Laterality Date   ABDOMINAL HYSTERECTOMY  1983   ANKLE SURGERY Left 1993   ANTERIOR CRUCIATE LIGAMENT REPAIR Left 2013   BREAST SURGERY     LEFT SIDE BIOPSY 1989   CHOLECYSTECTOMY  1990   COLONOSCOPY     ENDOVENOUS ABLATION SAPHENOUS VEIN W/ LASER Right 04/17/2017   endovenous laser ablation R [facility]SV by Josephina Gip MD   ESOPHAGOGASTRODUODENOSCOPY     HAND SURGERY Left 2011   LEFT ATRIAL APPENDAGE OCCLUSION N/A 06/22/2023   Procedure: LEFT ATRIAL APPENDAGE OCCLUSION;  Surgeon: Lanier Prude, MD;  Location: [facility];  Service: Cardiovascular;   Laterality: N/A;   QUADRICEPS TENDON REPAIR  07/16/2012   Procedure: REPAIR QUADRICEP TENDON;  Surgeon: Raymon Mutton, MD;  Location: [facility] OR;  Service: Orthopedics;  Laterality: Left;  open repair of VMO    quiadricp  10'21/2012   TEE  WITHOUT CARDIOVERSION N/A 06/22/2023   Procedure: TRANSESOPHAGEAL ECHOCARDIOGRAM;  Surgeon: Lanier Prude, MD;  Location[facility] LAB;  Service: Cardiovascular;  Laterality: N/A;   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   Procedure: TOTAL KNEE ARTHROPLASTY;  Surgeon: Raymon Mutton, MD;  Location: [facility] OR;  Service: Orthopedics;  Laterality: Left;   TOTAL KNEE ARTHROPLASTY Right 11/04/2013   DR Sherlean Foot   TOTAL KNEE ARTHROPLASTY Right 11/04/2013   Procedure: RIGHT TOTAL KNEE ARTHROPLASTY;  Surgeon: Dannielle Huh, MD;  Location: [facility];  Service: Orthopedics;  Laterality: Right;   TUBAL LIGATION  1974   vasculitic eruption of legs  1985     Allergies  Allergen Reactions   Sulfa Antibiotics     Reaction unknown   Adhesive [Tape] Rash and Other (See Comments)    Skin is very sensitive      Family History  Problem Relation Age of Onset   Lymphoma Father    Leukemia Father    Heart attack Father    Hypertension Mother    Emphysema Mother    Heart disease Sister    Rashes / Skin problems Sister    Heart failure Sister    Kidney disease Sister    Heart disease Maternal Grandfather    Heart disease Paternal Grandmother    Heart disease Paternal Grandfather      Social History [name] reports that she has never smoked. She has never been exposed to tobacco smoke. She has never used smokeless tobacco. [name] reports no history of alcohol use.    Physical Examination Today's Vitals   12/20/23 1401  BP: 122/72  Pulse: 76  SpO2: 95%  Weight: 217 lb 9.6 oz (98.7 kg)  Height: 5\' 8"  (1.727 m)   Body mass index is 33.09 kg/m.  Gen: resting comfortably, no acute distress HEENT: no scleral icterus, pupils equal round and reactive, no  palptable cervical adenopathy,  CV: RRR, no mrg, no jvd Resp: Clear to auscultation bilaterally GI: abdomen is soft, non-tender, non-distended, normal bowel sounds, no hepatosplenomegaly MSK: extremities are warm, no edema.  Skin: warm, no rash Neuro:  no focal deficits Psych: appropriate affect   Diagnostic Studies  01/2023 echo 1. Left ventricular ejection fraction, by estimation, is 60 to 65%. The  left ventricle has normal function. The left ventricle has no regional  wall motion abnormalities. Left ventricular diastolic parameters are  indeterminate.   2. Ventricular septal flattening in diastole suggesting RV volume  overload. . Right ventricular systolic function is moderately reduced. The  right ventricular size is severely enlarged. There is moderately elevated  pulmonary artery systolic pressure.   3. Left atrial size was moderately dilated.   4. Right atrial size was moderately dilated.   5. The mitral valve is abnormal. Mild mitral valve regurgitation. No  evidence of mitral stenosis.   6. The tricuspid valve is abnormal. Tricuspid valve regurgitation is  severe.   7. The aortic valve is tricuspid. Aortic valve regurgitation is not  visualized. No aortic stenosis is present.   8. The inferior vena cava is dilated in size with <50% respiratory  variability, suggesting right atrial pressure of 15 mmHg.      Assessment and Plan   1.Chronic diastolic HF - down nearly 80 lbs since March admission with volume overload - weights are stable now, we cut her torsemide back to 20mg  daily which has done well as a maintenance dose. - appears euvolemic today, continue current meds -prior UTI symptoms, holding  on SGLT2i - repeat bmet/mg on diuretic.    2. Pulmonary HTN/RV dysfunction - suspect large component was elevated left sided pressure, OSA. - seen by HF clinic, plans to contniue current management at this time. RHC at this time would not affect management.  - continue  to manage volume status.    3. Afib - off anticoag since prior  head bleed - watchman aborted due to prior head bleed - no great options at this time.    4.HTN - at goal, continue curren tmeds     Antoine Poche, M.D.

## 2024-01-08 ENCOUNTER — Encounter: Payer: Self-pay | Admitting: *Deleted

## 2024-02-06 ENCOUNTER — Ambulatory Visit: Payer: Self-pay

## 2024-02-24 ENCOUNTER — Other Ambulatory Visit: Payer: Self-pay | Admitting: Cardiology

## 2024-02-27 ENCOUNTER — Other Ambulatory Visit: Payer: Self-pay | Admitting: Cardiology

## 2024-03-02 ENCOUNTER — Other Ambulatory Visit: Payer: Self-pay | Admitting: Cardiology

## 2024-03-16 ENCOUNTER — Other Ambulatory Visit: Payer: Self-pay | Admitting: Cardiology

## 2024-06-10 ENCOUNTER — Other Ambulatory Visit: Payer: Self-pay | Admitting: Cardiology

## 2024-08-07 ENCOUNTER — Encounter: Payer: Self-pay | Admitting: Cardiology

## 2024-08-07 ENCOUNTER — Ambulatory Visit: Attending: Cardiology | Admitting: Cardiology

## 2024-08-07 VITALS — BP 120/70 | HR 74 | Ht 68.0 in | Wt 223.2 lb

## 2024-08-07 DIAGNOSIS — I10 Essential (primary) hypertension: Secondary | ICD-10-CM | POA: Diagnosis present

## 2024-08-07 DIAGNOSIS — I272 Pulmonary hypertension, unspecified: Secondary | ICD-10-CM | POA: Diagnosis present

## 2024-08-07 DIAGNOSIS — I5032 Chronic diastolic (congestive) heart failure: Secondary | ICD-10-CM | POA: Insufficient documentation

## 2024-08-07 DIAGNOSIS — I480 Paroxysmal atrial fibrillation: Secondary | ICD-10-CM | POA: Diagnosis present

## 2024-08-07 NOTE — Progress Notes (Signed)
 Clinical Summary Ms. Abrell is a 83 y.o.female seen today for follow up of the following medical problems.    1.Chronic diastolic HF    - 02/7978 echo LVEF 55-60%, indet diastolic fxn 11-14-2021 echo: LVEF 60-65%, noWMAs, indet diastolic, normal RV, mild PASP 41 mmHg, severe LAE, RA pressure 15, severe TR    11/2022 echo: LVEF 55-60%. Indet diastolic fxn. Dshaped septum systole and diastole, mod RV dysfunction    01/2023 echo: LVEF 60-65%, indet diastolic function, RV volume overload, mod RV dysfunction, mod pulm HTN, mod BAE, severe TR   - Cr up 12/2022, we changed torsemide  from 40mg  to 20mg  daily and stopped daily potassium. Cr was up to 2.58, K 5.8 - Cr improved on repeat labs to 1.88. Baseline 1.4-1.5  - no SOB/DOE. No recent edema - compliant with meds, home weights tabel 219 to 221 lbs    2.Pulmonary HTN 11/2022 echo: LVEF 55-60%. Indet diastolic fxn. Dshaped septum systole and diastole, mod RV dysfunction. PASP 39. Severe TR - since time of echo down 74 lbs, repeat echo and reassess PA pressure and RV function. Suspect a large part of it was due to elevated left sided pressures      01/2023 echo: LVEF 60-65%, indet diastolic function, RV volume overload, mod RV dysfunction, mod pulm HTN, mod BAE, severe TR  - seen by HF clinic, recs to continue managing her diastolic and OSA. RHC at this time unlikely to change management   3.HTN - low bp's after her significant weight losss, we stopped irbesartan  - home 100-110s/50s-60s     4. 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  - no symptoms     -prior admit with SAH/SDH 11/2022 - from neurosurg note thought that eliquis  greater risk than benefit -referred to Dr Cindie for watchman eval, plans for implant later this month   -watchman was aborted due to left atrial appendage thrombus. - has been just on plavix .  - no recent palpitations.      4. OSA - not using a cpap, has not been interested  in - discussed again, does not want to readdress at this time.       5. Chronic venous insufficiency - followed by vascular Dr Gerlean - swelling up and down.  - previously seen in wound clinic.  - wears compression stockings  - has compression socks        6. 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, traumatic head bleed - managed without surgery     SH: her significant other is Ozell Redo who is also a patient of mine who passed away 11-15-19 Past Medical History:  Diagnosis Date   Anemia    Anxiety    Arthritis    Back pain    reason unknown   Cataract    bilateral immature   Diarrhea    Diverticulosis    GERD (gastroesophageal reflux disease)    takes Omeprazole daily   Headache(784.0)    occasionally   Hiatal hernia    History of blood transfusion    no abnormal reaction noted   History of bronchitis 2014   History of colon polyps    History of shingles    Hypertension    takes Metoprolol ,Amlodipine  and Losartan  daily   Internal hemorrhoids  Joint pain    Joint swelling    Lung nodule    Nocturia    Numbness    hands occasionally   Osteoarthritis    Peripheral edema    takes HCTZ daily and Furosemide  daily as needed   PONV (postoperative nausea and vomiting)    Redness    to both lower extremities   Shortness of breath    WITH EXERTION    Sleep apnea    doesn't use cpap   Urinary frequency    Urinary urgency    Venous insufficiency      Allergies  Allergen Reactions   Sulfa Antibiotics     Reaction unknown   Adhesive [Tape] Rash and Other (See Comments)    Skin is very sensitive     Current Outpatient Medications  Medication Sig Dispense Refill   acetaminophen  (TYLENOL ) 325 MG tablet Take 1-2 tablets (325-650 mg total) by mouth every 4 (four) hours as needed for mild pain.     Biotin  5000 MCG CAPS Take 5,000 mcg by mouth  daily.     Calcium  Citrate-Vitamin D 500-12.5 MG-MCG PACK Take 1 tablet by mouth daily.     clopidogrel  (PLAVIX ) 75 MG tablet TAKE 1 TABLET BY MOUTH EVERY DAY 90 tablet 2   ferrous sulfate  325 (65 FE) MG tablet Take 325 mg by mouth 2 (two) times daily with a meal.     Glucosamine-Chondroitin (GLUCOSAMINE CHONDR COMPLEX PO) Take 1 capsule by mouth daily.     magnesium  oxide (MAG-OX) 400 MG tablet Take 1 tablet (400 mg total) by mouth daily. 30 tablet 0   melatonin 3 MG TABS tablet Take 1 tablet (3 mg total) by mouth at bedtime. 30 tablet 0   metoprolol  tartrate (LOPRESSOR ) 50 MG tablet TAKE 1 TABLET BY MOUTH TWICE A DAY 180 tablet 2   pantoprazole  (PROTONIX ) 20 MG tablet TAKE 1 TABLET BY MOUTH EVERY DAY 90 tablet 1   spironolactone  (ALDACTONE ) 25 MG tablet TAKE 1 TABLET (25 MG TOTAL) BY MOUTH DAILY. 90 tablet 2   torsemide  (DEMADEX ) 20 MG tablet TAKE 1 TABLET BY MOUTH EVERY DAY 90 tablet 3   triamcinolone  lotion (KENALOG ) 0.1 % Apply 1 application  topically daily as needed (for legs).     vitamin B-12 (CYANOCOBALAMIN ) 1000 MCG tablet Take 1,000 mcg by mouth daily.     No current facility-administered medications for this visit.     Past Surgical History:  Procedure Laterality Date   ABDOMINAL HYSTERECTOMY  1983   ANKLE SURGERY Left 1993   ANTERIOR CRUCIATE LIGAMENT REPAIR Left 2013   BREAST SURGERY     LEFT SIDE BIOPSY 1989   CHOLECYSTECTOMY  1990   COLONOSCOPY     ENDOVENOUS ABLATION SAPHENOUS VEIN W/ LASER Right 04/17/2017   endovenous laser ablation R GSV by Lynwood Collum MD   ESOPHAGOGASTRODUODENOSCOPY     HAND SURGERY Left 2011   LEFT ATRIAL APPENDAGE OCCLUSION N/A 06/22/2023   Procedure: LEFT ATRIAL APPENDAGE OCCLUSION;  Surgeon: Cindie Ole DASEN, MD;  Location: [facility];  Service: Cardiovascular;  Laterality: N/A;   QUADRICEPS TENDON REPAIR  07/16/2012   Procedure: REPAIR QUADRICEP TENDON;  Surgeon: Garnette JONETTA Raman, MD;  Location: [facility] OR;  Service: Orthopedics;   Laterality: Left;  open repair of VMO    quiadricp  10'21/2012   TEE WITHOUT CARDIOVERSION N/A 06/22/2023   Procedure: TRANSESOPHAGEAL ECHOCARDIOGRAM;  Surgeon: Cindie Ole DASEN, MD;  Location: [facility] INVASIVE CV LAB;  Service: Cardiovascular;  Laterality:  N/A;   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   Procedure: TOTAL KNEE ARTHROPLASTY;  Surgeon: Garnette JONETTA Raman, MD;  Location: [facility];  Service: Orthopedics;  Laterality: Left;   TOTAL KNEE ARTHROPLASTY Right 11/04/2013   DR RAMAN   TOTAL KNEE ARTHROPLASTY Right 11/04/2013   Procedure: RIGHT TOTAL KNEE ARTHROPLASTY;  Surgeon: Marcey Raman, MD;  Location: [facility];  Service: Orthopedics;  Laterality: Right;   TUBAL LIGATION  1974   vasculitic eruption of legs  1985     Allergies  Allergen Reactions   Sulfa Antibiotics     Reaction unknown   Adhesive [Tape] Rash and Other (See Comments)    Skin is very sensitive      Family History  Problem Relation Age of Onset   Lymphoma Father    Leukemia Father    Heart attack Father    Hypertension Mother    Emphysema Mother    Heart disease Sister    Rashes / Skin problems Sister    Heart failure Sister    Kidney disease Sister    Heart disease Maternal Grandfather    Heart disease Paternal Grandmother    Heart disease Paternal Grandfather      Social History Ms. Badley reports that she has never smoked. She has never been exposed to tobacco smoke. She has never used smokeless tobacco. Ms. Erdman reports no history of alcohol use.   Physical Examination Today's Vitals   08/07/24 1418  BP: 120/70  Pulse: 74  SpO2: 94%  Weight: 223 lb 3.2 oz (101.2 kg)  Height: 5' 8 (1.727 m)   Body mass index is 33.94 kg/m.  Gen: resting comfortably, no acute distress HEENT: no scleral icterus, pupils equal round and reactive, no palptable cervical adenopathy,  CV: irreg, no m/rg, no jvd Resp: Clear to auscultation bilaterally GI: abdomen is soft, non-tender, non-distended, normal bowel sounds, no  hepatosplenomegaly MSK: extremities are warm, no edema.  Skin: warm, no rash Neuro:  no focal deficits Psych: appropriate affect   Diagnostic Studies  01/2023 echo 1. Left ventricular ejection fraction, by estimation, is 60 to 65%. The  left ventricle has normal function. The left ventricle has no regional  wall motion abnormalities. Left ventricular diastolic parameters are  indeterminate.   2. Ventricular septal flattening in diastole suggesting RV volume  overload. . Right ventricular systolic function is moderately reduced. The  right ventricular size is severely enlarged. There is moderately elevated  pulmonary artery systolic pressure.   3. Left atrial size was moderately dilated.   4. Right atrial size was moderately dilated.   5. The mitral valve is abnormal. Mild mitral valve regurgitation. No  evidence of mitral stenosis.   6. The tricuspid valve is abnormal. Tricuspid valve regurgitation is  severe.   7. The aortic valve is tricuspid. Aortic valve regurgitation is not  visualized. No aortic stenosis is present.   8. The inferior vena cava is dilated in size with <50% respiratory  variability, suggesting right atrial pressure of 15 mmHg.      Assessment and Plan  1.Chronic diastolic HF - euvolemic today, continue current medications.    2. Pulmonary HTN/RV dysfunction - suspect large component was elevated left sided pressure, OSA. - seen by HF clinic, plans to contniue current management at this time. RHC at this time would not affect management.  - we will continue current therapy   3. Afib - off anticoag since prior  head bleed - watchman aborted due to LA appendage thrombus -  contiinue current meds , EKG today shows rate controlled afib   4.HTN - bp at goal, continue current meds      Dorn PHEBE Ross, M.D.

## 2024-08-07 NOTE — Patient Instructions (Signed)

## 2024-08-23 ENCOUNTER — Other Ambulatory Visit: Payer: Self-pay | Admitting: Cardiology

## 2025-02-03 ENCOUNTER — Ambulatory Visit: Admitting: Cardiology
